# Patient Record
Sex: Male | Born: 1950 | Race: White | Hispanic: No | Marital: Married | Smoking: Former smoker
Health system: Southern US, Community
[De-identification: ages and names within clinical notes are randomized; demographics above are authoritative.]

## PROBLEM LIST (undated history)

## (undated) ENCOUNTER — Emergency Department (HOSPITAL_COMMUNITY): Admission: EM | Payer: Managed Care, Other (non HMO) | Source: Home / Self Care

## (undated) DIAGNOSIS — L97319 Non-pressure chronic ulcer of right ankle with unspecified severity: Secondary | ICD-10-CM

## (undated) DIAGNOSIS — I10 Essential (primary) hypertension: Secondary | ICD-10-CM

## (undated) DIAGNOSIS — M1990 Unspecified osteoarthritis, unspecified site: Secondary | ICD-10-CM

## (undated) DIAGNOSIS — E119 Type 2 diabetes mellitus without complications: Secondary | ICD-10-CM

## (undated) DIAGNOSIS — E7800 Pure hypercholesterolemia, unspecified: Secondary | ICD-10-CM

## (undated) DIAGNOSIS — I739 Peripheral vascular disease, unspecified: Secondary | ICD-10-CM

## (undated) DIAGNOSIS — H9190 Unspecified hearing loss, unspecified ear: Secondary | ICD-10-CM

## (undated) HISTORY — PX: PILONIDAL CYST EXCISION: SHX744

## (undated) HISTORY — DX: Non-pressure chronic ulcer of right ankle with unspecified severity: L97.319

## (undated) HISTORY — PX: COLONOSCOPY W/ POLYPECTOMY: SHX1380

## (undated) HISTORY — DX: Peripheral vascular disease, unspecified: I73.9

## (undated) HISTORY — PX: KNEE ARTHROSCOPY: SUR90

---
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
## 2018-07-18 DIAGNOSIS — T8859XA Other complications of anesthesia, initial encounter: Secondary | ICD-10-CM

## 2018-07-18 HISTORY — DX: Other complications of anesthesia, initial encounter: T88.59XA

## 2018-08-05 DIAGNOSIS — M5136 Other intervertebral disc degeneration, lumbar region: Secondary | ICD-10-CM | POA: Insufficient documentation

## 2018-08-05 DIAGNOSIS — M545 Low back pain, unspecified: Secondary | ICD-10-CM | POA: Insufficient documentation

## 2018-08-05 DIAGNOSIS — M542 Cervicalgia: Secondary | ICD-10-CM | POA: Insufficient documentation

## 2018-08-11 ENCOUNTER — Encounter (HOSPITAL_COMMUNITY): Payer: Self-pay

## 2018-08-11 ENCOUNTER — Other Ambulatory Visit: Payer: Self-pay

## 2018-08-11 NOTE — Progress Notes (Signed)
PCP - [facility]John [facility]  Cardiologist - Denies  Chest x-ray - Denies  EKG - 08/12/18-DOS  Stress Test - Denies  ECHO - Denies  Cardiac Cath - Denies  AICD- na PM- na LOOP- na  Sleep Study - Denies CPAP - None  LABS- 08/12/18: CBC, BMP  ASA- LD- 11/21  HA1C- 08/12/18 Fasting Blood Sugar -  Checks Blood Sugar __0___ times a day- Pt can not locate his meter, he sts he has not checked his bs in months.  Anesthesia- No  Pt denies having chest pain, sob, or fever during the pre-op phone call. Partial instructions given, pt sts he can not write at this time and to call back around 2:30p when his wife will be available. Pt notified of time change, as well.

## 2018-08-11 NOTE — Progress Notes (Signed)
Called pt's wife, [name] after verifying with [name] at Dr. Marshell Levanumonski's office that [facility] Marshell Levanumonski's instructions. Gave her the medications that he can have in the AM with a small sip of water. She states he does not check his blood sugar at home.

## 2018-08-12 ENCOUNTER — Inpatient Hospital Stay (HOSPITAL_COMMUNITY)
Admission: EM | Disposition: A | Discharge status: Rehabilitation Facility | Payer: Self-pay | Source: Home / Self Care | Attending: Orthopedic Surgery

## 2018-08-12 ENCOUNTER — Emergency Department (HOSPITAL_COMMUNITY): Payer: Managed Care, Other (non HMO)

## 2018-08-12 ENCOUNTER — Other Ambulatory Visit: Payer: Self-pay | Admitting: Orthopedic Surgery

## 2018-08-12 ENCOUNTER — Emergency Department (HOSPITAL_COMMUNITY): Payer: Managed Care, Other (non HMO) | Admitting: Certified Registered Nurse Anesthetist

## 2018-08-12 ENCOUNTER — Other Ambulatory Visit: Payer: Self-pay

## 2018-08-12 ENCOUNTER — Inpatient Hospital Stay (HOSPITAL_COMMUNITY)
Admission: RE | Admit: 2018-08-12 | Payer: Managed Care, Other (non HMO) | Source: Ambulatory Visit | Admitting: Orthopedic Surgery

## 2018-08-12 ENCOUNTER — Encounter (HOSPITAL_COMMUNITY): Payer: Self-pay | Admitting: Emergency Medicine

## 2018-08-12 ENCOUNTER — Inpatient Hospital Stay (HOSPITAL_COMMUNITY)
Admission: EM | Admit: 2018-08-12 | Discharge: 2018-08-19 | DRG: 472 | Disposition: A | Discharge status: Rehabilitation Facility | Payer: Managed Care, Other (non HMO) | Attending: Orthopedic Surgery | Admitting: Orthopedic Surgery

## 2018-08-12 DIAGNOSIS — Z4789 Encounter for other orthopedic aftercare: Secondary | ICD-10-CM | POA: Diagnosis present

## 2018-08-12 DIAGNOSIS — G8250 Quadriplegia, unspecified: Secondary | ICD-10-CM | POA: Diagnosis present

## 2018-08-12 DIAGNOSIS — D72829 Elevated white blood cell count, unspecified: Secondary | ICD-10-CM | POA: Diagnosis present

## 2018-08-12 DIAGNOSIS — E11649 Type 2 diabetes mellitus with hypoglycemia without coma: Secondary | ICD-10-CM | POA: Diagnosis not present

## 2018-08-12 DIAGNOSIS — E871 Hypo-osmolality and hyponatremia: Secondary | ICD-10-CM | POA: Diagnosis present

## 2018-08-12 DIAGNOSIS — G959 Disease of spinal cord, unspecified: Secondary | ICD-10-CM | POA: Diagnosis present

## 2018-08-12 DIAGNOSIS — Z79899 Other long term (current) drug therapy: Secondary | ICD-10-CM

## 2018-08-12 DIAGNOSIS — Z23 Encounter for immunization: Secondary | ICD-10-CM | POA: Diagnosis not present

## 2018-08-12 DIAGNOSIS — M25561 Pain in right knee: Secondary | ICD-10-CM | POA: Diagnosis present

## 2018-08-12 DIAGNOSIS — M4802 Spinal stenosis, cervical region: Principal | ICD-10-CM | POA: Diagnosis present

## 2018-08-12 DIAGNOSIS — K5900 Constipation, unspecified: Secondary | ICD-10-CM | POA: Diagnosis present

## 2018-08-12 DIAGNOSIS — E785 Hyperlipidemia, unspecified: Secondary | ICD-10-CM | POA: Diagnosis present

## 2018-08-12 DIAGNOSIS — Z87891 Personal history of nicotine dependence: Secondary | ICD-10-CM

## 2018-08-12 DIAGNOSIS — N289 Disorder of kidney and ureter, unspecified: Secondary | ICD-10-CM | POA: Diagnosis present

## 2018-08-12 DIAGNOSIS — R471 Dysarthria and anarthria: Secondary | ICD-10-CM | POA: Diagnosis present

## 2018-08-12 DIAGNOSIS — R296 Repeated falls: Secondary | ICD-10-CM | POA: Diagnosis present

## 2018-08-12 DIAGNOSIS — Z781 Physical restraint status: Secondary | ICD-10-CM | POA: Diagnosis not present

## 2018-08-12 DIAGNOSIS — G9589 Other specified diseases of spinal cord: Secondary | ICD-10-CM | POA: Diagnosis present

## 2018-08-12 DIAGNOSIS — H9190 Unspecified hearing loss, unspecified ear: Secondary | ICD-10-CM | POA: Diagnosis present

## 2018-08-12 DIAGNOSIS — M4712 Other spondylosis with myelopathy, cervical region: Secondary | ICD-10-CM | POA: Diagnosis present

## 2018-08-12 DIAGNOSIS — J9811 Atelectasis: Secondary | ICD-10-CM | POA: Diagnosis present

## 2018-08-12 DIAGNOSIS — Z7982 Long term (current) use of aspirin: Secondary | ICD-10-CM | POA: Diagnosis not present

## 2018-08-12 DIAGNOSIS — K567 Ileus, unspecified: Secondary | ICD-10-CM | POA: Diagnosis present

## 2018-08-12 DIAGNOSIS — Z7984 Long term (current) use of oral hypoglycemic drugs: Secondary | ICD-10-CM | POA: Diagnosis not present

## 2018-08-12 DIAGNOSIS — I10 Essential (primary) hypertension: Secondary | ICD-10-CM | POA: Diagnosis present

## 2018-08-12 DIAGNOSIS — G8918 Other acute postprocedural pain: Secondary | ICD-10-CM | POA: Diagnosis not present

## 2018-08-12 DIAGNOSIS — G542 Cervical root disorders, not elsewhere classified: Secondary | ICD-10-CM | POA: Diagnosis present

## 2018-08-12 DIAGNOSIS — E119 Type 2 diabetes mellitus without complications: Secondary | ICD-10-CM | POA: Diagnosis not present

## 2018-08-12 DIAGNOSIS — Z823 Family history of stroke: Secondary | ICD-10-CM | POA: Diagnosis not present

## 2018-08-12 DIAGNOSIS — Z9181 History of falling: Secondary | ICD-10-CM

## 2018-08-12 DIAGNOSIS — M25571 Pain in right ankle and joints of right foot: Secondary | ICD-10-CM | POA: Diagnosis present

## 2018-08-12 DIAGNOSIS — G9520 Unspecified cord compression: Secondary | ICD-10-CM

## 2018-08-12 DIAGNOSIS — K5903 Drug induced constipation: Secondary | ICD-10-CM

## 2018-08-12 DIAGNOSIS — R600 Localized edema: Secondary | ICD-10-CM | POA: Diagnosis not present

## 2018-08-12 DIAGNOSIS — Z419 Encounter for procedure for purposes other than remedying health state, unspecified: Secondary | ICD-10-CM

## 2018-08-12 DIAGNOSIS — E1165 Type 2 diabetes mellitus with hyperglycemia: Secondary | ICD-10-CM | POA: Diagnosis present

## 2018-08-12 DIAGNOSIS — Z981 Arthrodesis status: Secondary | ICD-10-CM | POA: Diagnosis not present

## 2018-08-12 HISTORY — DX: Essential (primary) hypertension: I10

## 2018-08-12 HISTORY — DX: Pure hypercholesterolemia, unspecified: E78.00

## 2018-08-12 HISTORY — PX: ANTERIOR CERVICAL DECOMP/DISCECTOMY FUSION: SHX1161

## 2018-08-12 HISTORY — DX: Unspecified osteoarthritis, unspecified site: M19.90

## 2018-08-12 HISTORY — PX: ANTERIOR CERVICAL DECOMPRESSION/DISCECTOMY FUSION 4 LEVELS: SHX5556

## 2018-08-12 HISTORY — DX: Type 2 diabetes mellitus without complications: E11.9

## 2018-08-12 LAB — CBC WITH DIFFERENTIAL/PLATELET
Abs Immature Granulocytes: 0.04 10*3/uL (ref 0.00–0.07)
Basophils Absolute: 0.1 10*3/uL (ref 0.0–0.1)
Basophils Relative: 1 %
Eosinophils Absolute: 0.1 10*3/uL (ref 0.0–0.5)
Eosinophils Relative: 1 %
HCT: 44.6 % (ref 39.0–52.0)
Hemoglobin: 14 g/dL (ref 13.0–17.0)
Immature Granulocytes: 0 %
Lymphocytes Relative: 11 %
Lymphs Abs: 1.1 10*3/uL (ref 0.7–4.0)
MCH: 28.1 pg (ref 26.0–34.0)
MCHC: 31.4 g/dL (ref 30.0–36.0)
MCV: 89.4 fL (ref 80.0–100.0)
Monocytes Absolute: 0.9 10*3/uL (ref 0.1–1.0)
Monocytes Relative: 8 %
Neutro Abs: 8.2 10*3/uL — ABNORMAL HIGH (ref 1.7–7.7)
Neutrophils Relative %: 79 %
Platelets: 208 10*3/uL (ref 150–400)
RBC: 4.99 MIL/uL (ref 4.22–5.81)
RDW: 12.2 % (ref 11.5–15.5)
WBC: 10.5 10*3/uL (ref 4.0–10.5)
nRBC: 0 % (ref 0.0–0.2)

## 2018-08-12 LAB — BASIC METABOLIC PANEL
Anion gap: 7 (ref 5–15)
BUN: 15 mg/dL (ref 8–23)
CO2: 26 mmol/L (ref 22–32)
Calcium: 9.1 mg/dL (ref 8.9–10.3)
Chloride: 107 mmol/L (ref 98–111)
Creatinine, Ser: 0.73 mg/dL (ref 0.61–1.24)
GFR calc Af Amer: >60 mL/min (ref >60)
GFR calc non Af Amer: >60 mL/min (ref >60)
Glucose, Bld: 119 mg/dL — ABNORMAL HIGH (ref 70–99)
Potassium: 3.8 mmol/L (ref 3.5–5.1)
Sodium: 140 mmol/L (ref 135–145)

## 2018-08-12 LAB — TYPE AND SCREEN
ABO/RH(D): O POS
Antibody Screen: NEGATIVE

## 2018-08-12 LAB — PROTIME-INR
INR: 1.02
Prothrombin Time: 13.3 seconds (ref 11.4–15.2)

## 2018-08-12 LAB — GLUCOSE, CAPILLARY
Glucose-Capillary: 98 mg/dL (ref 70–99)
Glucose-Capillary: 130 mg/dL — ABNORMAL HIGH (ref 70–99)

## 2018-08-12 LAB — ABO/RH: ABO/RH(D): O POS

## 2018-08-12 SURGERY — ANTERIOR CERVICAL DECOMPRESSION/DISCECTOMY FUSION 4 LEVELS
Anesthesia: General | Site: Neck

## 2018-08-12 MED ORDER — LACTATED RINGERS IV SOLN: INTRAVENOUS | Status: DC | PRN

## 2018-08-12 MED ORDER — VITAMIN D 25 MCG (1000 UNIT) PO TABS
1000.0000 [IU] | ORAL_TABLET | Freq: Every day | ORAL | Status: DC
  Administered 2018-08-13 – 2018-08-19 (×7): 1000 [IU] via ORAL
  Filled 2018-08-12 (×7): qty 1

## 2018-08-12 MED ORDER — CLONIDINE HCL 0.1 MG PO TABS
0.1000 mg | ORAL_TABLET | Freq: Two times a day (BID) | ORAL | Status: DC
  Administered 2018-08-12 – 2018-08-19 (×14): 0.1 mg via ORAL
  Filled 2018-08-12 (×14): qty 1

## 2018-08-12 MED ORDER — THROMBIN 20000 UNITS EX SOLR
CUTANEOUS | Status: DC | PRN
  Administered 2018-08-12: 17:00:00

## 2018-08-12 MED ORDER — METFORMIN HCL ER 500 MG PO TB24
1500.0000 mg | ORAL_TABLET | Freq: Every evening | ORAL | Status: DC
  Administered 2018-08-12 – 2018-08-16 (×5): 1500 mg via ORAL
  Filled 2018-08-12 (×6): qty 3

## 2018-08-12 MED ORDER — CEFAZOLIN SODIUM-DEXTROSE 2-4 GM/100ML-% IV SOLN
2.0000 g | INTRAVENOUS | Status: AC
  Administered 2018-08-12 (×2): 2 g via INTRAVENOUS

## 2018-08-12 MED ORDER — ATORVASTATIN CALCIUM 10 MG PO TABS
20.0000 mg | ORAL_TABLET | ORAL | Status: DC
  Administered 2018-08-14 – 2018-08-19 (×4): 20 mg via ORAL
  Filled 2018-08-12 (×4): qty 2

## 2018-08-12 MED ORDER — GLIMEPIRIDE 4 MG PO TABS
8.0000 mg | ORAL_TABLET | Freq: Every day | ORAL | Status: DC
  Administered 2018-08-13 – 2018-08-19 (×7): 8 mg via ORAL
  Filled 2018-08-12 (×7): qty 2

## 2018-08-12 MED ORDER — CEFAZOLIN SODIUM-DEXTROSE 2-4 GM/100ML-% IV SOLN
2.0000 g | Freq: Three times a day (TID) | INTRAVENOUS | Status: DC
  Filled 2018-08-12 (×2): qty 100

## 2018-08-12 MED ORDER — FLEET ENEMA 7-19 GM/118ML RE ENEM: 1.0000 | ENEMA | Freq: Once | RECTAL | Status: DC | PRN

## 2018-08-12 MED ORDER — ONDANSETRON HCL 4 MG/2ML IJ SOLN
INTRAMUSCULAR | Status: DC | PRN
  Administered 2018-08-12: 4 mg via INTRAVENOUS

## 2018-08-12 MED ORDER — BUPIVACAINE-EPINEPHRINE (PF) 0.25% -1:200000 IJ SOLN
INTRAMUSCULAR | Status: AC
  Filled 2018-08-12: qty 30

## 2018-08-12 MED ORDER — SODIUM CHLORIDE 0.9% FLUSH: 3.0000 mL | INTRAVENOUS | Status: DC | PRN

## 2018-08-12 MED ORDER — ALUM & MAG HYDROXIDE-SIMETH 200-200-20 MG/5ML PO SUSP
30.0000 mL | Freq: Four times a day (QID) | ORAL | Status: DC | PRN
  Administered 2018-08-15: 30 mL via ORAL
  Filled 2018-08-12: qty 30

## 2018-08-12 MED ORDER — OXYCODONE-ACETAMINOPHEN 5-325 MG PO TABS
1.0000 | ORAL_TABLET | ORAL | Status: DC | PRN
  Administered 2018-08-12 – 2018-08-19 (×10): 2 via ORAL
  Filled 2018-08-12 (×10): qty 2

## 2018-08-12 MED ORDER — AMLODIPINE BESYLATE 10 MG PO TABS
10.0000 mg | ORAL_TABLET | Freq: Every day | ORAL | Status: DC
  Administered 2018-08-13 – 2018-08-19 (×7): 10 mg via ORAL
  Filled 2018-08-12 (×7): qty 1

## 2018-08-12 MED ORDER — SODIUM CHLORIDE 0.9 % IV SOLN: 250.0000 mL | INTRAVENOUS | Status: DC

## 2018-08-12 MED ORDER — MIDAZOLAM HCL 2 MG/2ML IJ SOLN
INTRAMUSCULAR | Status: AC
  Filled 2018-08-12: qty 2

## 2018-08-12 MED ORDER — DOCUSATE SODIUM 100 MG PO CAPS
100.0000 mg | ORAL_CAPSULE | Freq: Two times a day (BID) | ORAL | Status: DC
  Administered 2018-08-12 – 2018-08-19 (×14): 100 mg via ORAL
  Filled 2018-08-12 (×14): qty 1

## 2018-08-12 MED ORDER — CEFAZOLIN SODIUM-DEXTROSE 2-4 GM/100ML-% IV SOLN
INTRAVENOUS | Status: AC
  Filled 2018-08-12: qty 100

## 2018-08-12 MED ORDER — HYDROMORPHONE HCL 1 MG/ML IJ SOLN
INTRAMUSCULAR | Status: AC
  Filled 2018-08-12: qty 1

## 2018-08-12 MED ORDER — OXYCODONE HCL 5 MG PO TABS: 5.0000 mg | ORAL_TABLET | Freq: Once | ORAL | Status: DC | PRN

## 2018-08-12 MED ORDER — PHENYLEPHRINE 40 MCG/ML (10ML) SYRINGE FOR IV PUSH (FOR BLOOD PRESSURE SUPPORT)
PREFILLED_SYRINGE | INTRAVENOUS | Status: AC
  Filled 2018-08-12: qty 10

## 2018-08-12 MED ORDER — LABETALOL HCL 5 MG/ML IV SOLN
INTRAVENOUS | Status: AC
  Filled 2018-08-12: qty 4

## 2018-08-12 MED ORDER — DIAZEPAM 5 MG PO TABS
5.0000 mg | ORAL_TABLET | Freq: Four times a day (QID) | ORAL | Status: DC | PRN
  Administered 2018-08-13 – 2018-08-15 (×3): 5 mg via ORAL
  Filled 2018-08-12 (×4): qty 1

## 2018-08-12 MED ORDER — VECURONIUM BROMIDE 10 MG IV SOLR
INTRAVENOUS | Status: AC
  Filled 2018-08-12: qty 10

## 2018-08-12 MED ORDER — HYDROMORPHONE HCL 1 MG/ML IJ SOLN
0.2500 mg | INTRAMUSCULAR | Status: DC | PRN
  Administered 2018-08-12 (×2): 0.5 mg via INTRAVENOUS

## 2018-08-12 MED ORDER — POVIDONE-IODINE 7.5 % EX SOLN
Freq: Once | CUTANEOUS | Status: DC
  Filled 2018-08-12: qty 118

## 2018-08-12 MED ORDER — ACETAMINOPHEN 325 MG PO TABS
650.0000 mg | ORAL_TABLET | ORAL | Status: DC | PRN
  Administered 2018-08-14 – 2018-08-17 (×2): 650 mg via ORAL
  Filled 2018-08-12 (×2): qty 2

## 2018-08-12 MED ORDER — ONDANSETRON HCL 4 MG PO TABS: 4.0000 mg | ORAL_TABLET | Freq: Four times a day (QID) | ORAL | Status: DC | PRN

## 2018-08-12 MED ORDER — PANTOPRAZOLE SODIUM 40 MG IV SOLR
40.0000 mg | Freq: Every day | INTRAVENOUS | Status: DC
  Administered 2018-08-12: 40 mg via INTRAVENOUS
  Filled 2018-08-12: qty 40

## 2018-08-12 MED ORDER — BISACODYL 5 MG PO TBEC
5.0000 mg | DELAYED_RELEASE_TABLET | Freq: Every day | ORAL | Status: DC | PRN
  Administered 2018-08-19: 5 mg via ORAL
  Filled 2018-08-12: qty 1

## 2018-08-12 MED ORDER — SUGAMMADEX SODIUM 500 MG/5ML IV SOLN
INTRAVENOUS | Status: AC
  Filled 2018-08-12: qty 5

## 2018-08-12 MED ORDER — FENTANYL CITRATE (PF) 250 MCG/5ML IJ SOLN
INTRAMUSCULAR | Status: AC
  Filled 2018-08-12: qty 5

## 2018-08-12 MED ORDER — 0.9 % SODIUM CHLORIDE (POUR BTL) OPTIME
TOPICAL | Status: DC | PRN
  Administered 2018-08-12: 500 mL
  Administered 2018-08-12: 1000 mL

## 2018-08-12 MED ORDER — SENNOSIDES-DOCUSATE SODIUM 8.6-50 MG PO TABS: 1.0000 | ORAL_TABLET | Freq: Every evening | ORAL | Status: DC | PRN

## 2018-08-12 MED ORDER — OXYCODONE HCL 5 MG/5ML PO SOLN: 5.0000 mg | Freq: Once | ORAL | Status: DC | PRN

## 2018-08-12 MED ORDER — PHENYLEPHRINE HCL 10 MG/ML IJ SOLN
INTRAMUSCULAR | Status: DC | PRN
  Administered 2018-08-12: 80 ug via INTRAVENOUS

## 2018-08-12 MED ORDER — SODIUM CHLORIDE 0.9% FLUSH
3.0000 mL | Freq: Two times a day (BID) | INTRAVENOUS | Status: DC
  Administered 2018-08-13 – 2018-08-17 (×9): 3 mL via INTRAVENOUS

## 2018-08-12 MED ORDER — ROCURONIUM BROMIDE 10 MG/ML (PF) SYRINGE
PREFILLED_SYRINGE | INTRAVENOUS | Status: DC | PRN
  Administered 2018-08-12 (×2): 50 mg via INTRAVENOUS

## 2018-08-12 MED ORDER — PROPOFOL 10 MG/ML IV BOLUS
INTRAVENOUS | Status: AC
  Filled 2018-08-12: qty 20

## 2018-08-12 MED ORDER — FENTANYL CITRATE (PF) 250 MCG/5ML IJ SOLN
INTRAMUSCULAR | Status: DC | PRN
  Administered 2018-08-12: 150 ug via INTRAVENOUS
  Administered 2018-08-12: 100 ug via INTRAVENOUS

## 2018-08-12 MED ORDER — LISINOPRIL 40 MG PO TABS
40.0000 mg | ORAL_TABLET | Freq: Every day | ORAL | Status: DC
  Administered 2018-08-13 – 2018-08-19 (×7): 40 mg via ORAL
  Filled 2018-08-12 (×7): qty 1

## 2018-08-12 MED ORDER — LIDOCAINE 2% (20 MG/ML) 5 ML SYRINGE
INTRAMUSCULAR | Status: DC | PRN
  Administered 2018-08-12: 100 mg via INTRAVENOUS

## 2018-08-12 MED ORDER — LABETALOL HCL 5 MG/ML IV SOLN
10.0000 mg | INTRAVENOUS | Status: DC | PRN
  Administered 2018-08-12: 10 mg via INTRAVENOUS

## 2018-08-12 MED ORDER — MIDAZOLAM HCL 2 MG/2ML IJ SOLN
INTRAMUSCULAR | Status: DC | PRN
  Administered 2018-08-12 (×2): 1 mg via INTRAVENOUS

## 2018-08-12 MED ORDER — SODIUM CHLORIDE 0.9 % IV SOLN
Freq: Once | INTRAVENOUS | Status: AC
  Administered 2018-08-12: 08:00:00 via INTRAVENOUS

## 2018-08-12 MED ORDER — ACETAMINOPHEN 500 MG PO TABS: 1000.0000 mg | ORAL_TABLET | Freq: Every day | ORAL | Status: DC | PRN

## 2018-08-12 MED ORDER — LACTATED RINGERS IV SOLN
INTRAVENOUS | Status: DC
  Administered 2018-08-12 (×2): via INTRAVENOUS

## 2018-08-12 MED ORDER — THROMBIN 5000 UNITS EX SOLR
CUTANEOUS | Status: DC | PRN
  Administered 2018-08-12: 5000 [IU] via TOPICAL

## 2018-08-12 MED ORDER — CEFAZOLIN SODIUM-DEXTROSE 2-4 GM/100ML-% IV SOLN
2.0000 g | Freq: Three times a day (TID) | INTRAVENOUS | Status: AC
  Administered 2018-08-13 (×2): 2 g via INTRAVENOUS
  Filled 2018-08-12 (×2): qty 100

## 2018-08-12 MED ORDER — VECURONIUM BROMIDE 10 MG IV SOLR
INTRAVENOUS | Status: DC | PRN
  Administered 2018-08-12: 2 mg via INTRAVENOUS

## 2018-08-12 MED ORDER — PHENOL 1.4 % MT LIQD
1.0000 | OROMUCOSAL | Status: DC | PRN
  Administered 2018-08-13: 1 via OROMUCOSAL
  Filled 2018-08-12: qty 177

## 2018-08-12 MED ORDER — SODIUM CHLORIDE 0.9 % IV SOLN
INTRAVENOUS | Status: DC | PRN
  Administered 2018-08-12: 30 ug/min via INTRAVENOUS

## 2018-08-12 MED ORDER — ROCURONIUM BROMIDE 50 MG/5ML IV SOSY
PREFILLED_SYRINGE | INTRAVENOUS | Status: AC
  Filled 2018-08-12: qty 5

## 2018-08-12 MED ORDER — DEXAMETHASONE SODIUM PHOSPHATE 10 MG/ML IJ SOLN
INTRAMUSCULAR | Status: DC | PRN
  Administered 2018-08-12: 10 mg via INTRAVENOUS

## 2018-08-12 MED ORDER — BUPIVACAINE-EPINEPHRINE 0.25% -1:200000 IJ SOLN
INTRAMUSCULAR | Status: DC | PRN
  Administered 2018-08-12: 9 mL

## 2018-08-12 MED ORDER — ACETAMINOPHEN 650 MG RE SUPP: 650.0000 mg | RECTAL | Status: DC | PRN

## 2018-08-12 MED ORDER — CEFAZOLIN SODIUM 1 G IJ SOLR
INTRAMUSCULAR | Status: AC
  Filled 2018-08-12: qty 20

## 2018-08-12 MED ORDER — ONDANSETRON HCL 4 MG/2ML IJ SOLN: 4.0000 mg | Freq: Four times a day (QID) | INTRAMUSCULAR | Status: DC | PRN

## 2018-08-12 MED ORDER — PROMETHAZINE HCL 25 MG/ML IJ SOLN: 6.2500 mg | INTRAMUSCULAR | Status: DC | PRN

## 2018-08-12 MED ORDER — THROMBIN 5000 UNITS EX SOLR
CUTANEOUS | Status: AC
  Filled 2018-08-12: qty 5000

## 2018-08-12 MED ORDER — PIOGLITAZONE HCL 15 MG PO TABS
15.0000 mg | ORAL_TABLET | Freq: Every day | ORAL | Status: DC
  Administered 2018-08-13 – 2018-08-19 (×7): 15 mg via ORAL
  Filled 2018-08-12 (×7): qty 1

## 2018-08-12 MED ORDER — THROMBIN (RECOMBINANT) 20000 UNITS EX SOLR
CUTANEOUS | Status: AC
  Filled 2018-08-12: qty 20000

## 2018-08-12 MED ORDER — ONDANSETRON HCL 4 MG/2ML IJ SOLN
INTRAMUSCULAR | Status: AC
  Filled 2018-08-12: qty 2

## 2018-08-12 MED ORDER — PROPOFOL 10 MG/ML IV BOLUS
INTRAVENOUS | Status: DC | PRN
  Administered 2018-08-12: 200 mg via INTRAVENOUS

## 2018-08-12 MED ORDER — DEXAMETHASONE SODIUM PHOSPHATE 4 MG/ML IJ SOLN
8.0000 mg | Freq: Once | INTRAMUSCULAR | Status: AC
  Administered 2018-08-13: 8 mg via INTRAVENOUS
  Filled 2018-08-12: qty 2

## 2018-08-12 MED ORDER — PHENYLEPHRINE 40 MCG/ML (10ML) SYRINGE FOR IV PUSH (FOR BLOOD PRESSURE SUPPORT)
PREFILLED_SYRINGE | INTRAVENOUS | Status: DC | PRN
  Administered 2018-08-12 (×3): 80 ug via INTRAVENOUS

## 2018-08-12 MED ORDER — MENTHOL 3 MG MT LOZG: 1.0000 | LOZENGE | OROMUCOSAL | Status: DC | PRN

## 2018-08-12 SURGICAL SUPPLY — 75 items
APL SKNCLS STERI-STRIP NONHPOA (GAUZE/BANDAGES/DRESSINGS) ×1
BENZOIN TINCTURE PRP APPL 2/3 (GAUZE/BANDAGES/DRESSINGS) ×2 IMPLANT
BIT DRILL NEURO 2X3.1 SFT TUCH (MISCELLANEOUS) ×1 IMPLANT
BIT DRILL SRG 14X2.2XFLT CHK (BIT) IMPLANT
BIT DRL SRG 14X2.2XFLT CHK (BIT) ×1
BLADE CLIPPER SURG (BLADE) ×2 IMPLANT
BLADE SURG 15 STRL LF DISP TIS (BLADE) ×1 IMPLANT
BLADE SURG 15 STRL SS (BLADE) ×2
BONE VIVIGEN FORMABLE 5.4CC (Bone Implant) ×2 IMPLANT
BUR MATCHSTICK NEURO 3.0 LAGG (BURR) IMPLANT
CABLE BIPOLOR RESECTION CORD (MISCELLANEOUS) ×2 IMPLANT
CANISTER SUCTION WELLS/JOHNSON (MISCELLANEOUS) ×2 IMPLANT
CARTRIDGE OIL MAESTRO DRILL (MISCELLANEOUS) ×1 IMPLANT
COVER SURGICAL LIGHT HANDLE (MISCELLANEOUS) ×2 IMPLANT
COVER WAND RF STERILE (DRAPES) ×2 IMPLANT
CRADLE DONUT ADULT HEAD (MISCELLANEOUS) ×2 IMPLANT
DECANTER SPIKE VIAL GLASS SM (MISCELLANEOUS) ×2 IMPLANT
DIFFUSER DRILL AIR PNEUMATIC (MISCELLANEOUS) ×2 IMPLANT
DRAIN JACKSON RD 7FR 3/32 (WOUND CARE) IMPLANT
DRAPE C-ARM 42X72 X-RAY (DRAPES) ×2 IMPLANT
DRAPE POUCH INSTRU U-SHP 10X18 (DRAPES) ×2 IMPLANT
DRAPE SURG 17X23 STRL (DRAPES) ×7 IMPLANT
DRILL BIT SKYLINE 14MM (BIT) ×2
DRILL NEURO 2X3.1 SOFT TOUCH (MISCELLANEOUS) ×2
DURAPREP 26ML APPLICATOR (WOUND CARE) ×2 IMPLANT
ELECT COATED BLADE 2.86 ST (ELECTRODE) ×2 IMPLANT
ELECT REM PT RETURN 9FT ADLT (ELECTROSURGICAL) ×2
ELECTRODE REM PT RTRN 9FT ADLT (ELECTROSURGICAL) ×1 IMPLANT
EVACUATOR SILICONE 100CC (DRAIN) IMPLANT
GAUZE 4X4 16PLY RFD (DISPOSABLE) ×2 IMPLANT
GAUZE SPONGE 4X4 12PLY STRL (GAUZE/BANDAGES/DRESSINGS) ×2 IMPLANT
GLOVE BIO SURGEON STRL SZ7 (GLOVE) ×6 IMPLANT
GLOVE BIO SURGEON STRL SZ8 (GLOVE) ×4 IMPLANT
GLOVE BIOGEL PI IND STRL 7.0 (GLOVE) ×3 IMPLANT
GLOVE BIOGEL PI IND STRL 8 (GLOVE) ×1 IMPLANT
GLOVE BIOGEL PI INDICATOR 7.0 (GLOVE) ×3
GLOVE BIOGEL PI INDICATOR 8 (GLOVE) ×2
GOWN STRL REUS W/ TWL LRG LVL3 (GOWN DISPOSABLE) ×3 IMPLANT
GOWN STRL REUS W/ TWL XL LVL3 (GOWN DISPOSABLE) ×2 IMPLANT
GOWN STRL REUS W/TWL LRG LVL3 (GOWN DISPOSABLE) ×6
GOWN STRL REUS W/TWL XL LVL3 (GOWN DISPOSABLE) ×4
INTERLOCK LRDTC CRVCL VBR 6MM (Bone Implant) ×2 IMPLANT
INTERLOCK LRDTC CRVCL VBR 8MM (Peek) ×2 IMPLANT
IV CATH 14GX2 1/4 (CATHETERS) ×2 IMPLANT
KIT BASIN OR (CUSTOM PROCEDURE TRAY) ×2 IMPLANT
KIT TURNOVER KIT B (KITS) ×2 IMPLANT
LORDOTIC CERVICAL VBR 6MM SM (Bone Implant) ×4 IMPLANT
LORDOTIC CERVICAL VBR 8MM SM (Peek) ×4 IMPLANT
MANIFOLD NEPTUNE II (INSTRUMENTS) ×2 IMPLANT
NEEDLE PRECISIONGLIDE 27X1.5 (NEEDLE) ×2 IMPLANT
NEEDLE SPNL 20GX3.5 QUINCKE YW (NEEDLE) ×2 IMPLANT
NS IRRIG 1000ML POUR BTL (IV SOLUTION) ×2 IMPLANT
OIL CARTRIDGE MAESTRO DRILL (MISCELLANEOUS) ×2
PACK ORTHO CERVICAL (CUSTOM PROCEDURE TRAY) ×2 IMPLANT
PAD ARMBOARD 7.5X6 YLW CONV (MISCELLANEOUS) ×4 IMPLANT
PATTIES SURGICAL .5 X.5 (GAUZE/BANDAGES/DRESSINGS) IMPLANT
PATTIES SURGICAL .5 X1 (DISPOSABLE) ×1 IMPLANT
PIN DISTRACTION 14 (PIN) ×4 IMPLANT
PLATE SKYLINE 4LVL 76MM (Plate) ×2 IMPLANT
SCREW SKYLINE VAR OS 14MM (Screw) ×10 IMPLANT
SPONGE INTESTINAL PEANUT (DISPOSABLE) ×4 IMPLANT
SPONGE SURGIFOAM ABS GEL 100 (HEMOSTASIS) ×2 IMPLANT
STRIP CLOSURE SKIN 1/2X4 (GAUZE/BANDAGES/DRESSINGS) ×2 IMPLANT
SURGIFLO W/THROMBIN 8M KIT (HEMOSTASIS) IMPLANT
SUT MNCRL AB 4-0 PS2 18 (SUTURE) ×2 IMPLANT
SUT VIC AB 2-0 CT2 18 VCP726D (SUTURE) ×3 IMPLANT
SYR BULB IRRIGATION 50ML (SYRINGE) ×2 IMPLANT
SYR CONTROL 10ML LL (SYRINGE) ×4 IMPLANT
TAPE CLOTH 4X10 WHT NS (GAUZE/BANDAGES/DRESSINGS) ×2 IMPLANT
TAPE UMBILICAL COTTON 1/8X30 (MISCELLANEOUS) ×2 IMPLANT
TOWEL OR 17X24 6PK STRL BLUE (TOWEL DISPOSABLE) ×2 IMPLANT
TOWEL OR 17X26 10 PK STRL BLUE (TOWEL DISPOSABLE) ×2 IMPLANT
TRAY FOLEY MTR SLVR 16FR STAT (SET/KITS/TRAYS/PACK) ×2 IMPLANT
WATER STERILE IRR 1000ML POUR (IV SOLUTION) ×2 IMPLANT
YANKAUER SUCT BULB TIP NO VENT (SUCTIONS) ×2 IMPLANT

## 2018-08-12 NOTE — ED Notes (Signed)
Pt is to be taken to short stay for pre-op after 1345. Pt and family aware.

## 2018-08-12 NOTE — Op Note (Signed)
NAME:  Brandon Collins                MEDICAL RECORD NO:  [id number]  PHYSICIAN:  Estill BambergMark Clayton Bosserman, MD      DATE OF BIRTH:  28-Oct-1950  DATE OF PROCEDURE:  08/12/2018 Second medicine                              OPERATIVE REPORT   PREOPERATIVE DIAGNOSES: 1. Rapidly progressive cervical myelopathy 2. Spinal stenosis spanning C3-C7, including severe spinal cord compression and myelomalacia  POSTOPERATIVE DIAGNOSES: 1. Rapidly progressive cervical myelopathy 2. Spinal stenosis spanning C3-C7, including severe spinal cord compression and myelomalacia  PROCEDURE: 1. Anterior cervical decompression and fusion C3/4, C4/5, C5/6, C6/7. 2. Placement of anterior instrumentation, C3-C7. 3. Insertion of interbody device x 4 (Titan intervertebral spacers). 4. Intraoperative use of fluoroscopy. 5. Use of morselized allograft - ViviGen.  SURGEON:  Estill BambergMark Kanen Mottola, MD  ASSISTANT:  Jason CoopKayla McKenzie, [name].  ANESTHESIA:  General endotracheal anesthesia.  COMPLICATIONS:  None.  DISPOSITION:  Stable.  ESTIMATED BLOOD LOSS:  Minimal.  INDICATIONS FOR SURGERY:  Briefly, Brandon Collins is a pleasant 67 year old male, who did present to me with rapidly progressive cervical myelopathy. Specifically, 3 weeks ago, the patient was walking independently, and over the course of the last 2 to 3 weeks, the patient has rapidly lost strength in his bilateral arms and legs.  He has been having to walk with a rolling walker.  His MRI did reveal very prominent findings, as outlined above.  We did discuss proceeding with the surgery noted above, on an outpatient basis.  However, the patient's symptoms became so severe to where he was unable to safely care for himself at home, and he did present to the emergency department earlier today.  We did again discuss the details of the surgery outlined above, and the patient did wish to proceed.  Please refer to my office notes for additional details regarding discussion with  the patient, in addition to the expected outcomes of surgery, risks of surgery, and recovery period.    OPERATIVE DETAILS:  On 08/12/2018, the patient was brought to surgery and general endotracheal anesthesia was administered.  The patient was placed supine on the hospital bed. The neck was gently extended.  All bony prominences were meticulously padded.  The neck was prepped and draped in the usual sterile fashion.  At this point, I did make a left-sided oblique incision.  The platysma was incised.  A Smith-Robinson approach was used and the anterior spine was identified. A self-retaining retractor was placed.  I then subperiosteally exposed the vertebral bodies from C3-C7.  Caspar pins were then placed into the C3 and C4 vertebral bodies, as this was the worst level, and distraction was gently applied.   A thorough and complete C3-4 intervertebral diskectomy was performed.  The posterior longitudinal ligament was identified and entered using a nerve hook.  I then used #1 followed by #2 Kerrison to perform a thorough and complete intervertebral diskectomy.  The spinal canal was thoroughly decompressed, as was the right and left neuroforamen.  The endplates were then prepared and the appropriate-sized intervertebral spacer was then packed with ViviGen and tamped into position in the usual fashion.  The upper Caspar pin was then removed and placed into the C5 vertebral body and once again, distraction was applied across the C4-5 intervertebral space.  I then again performed a thorough and complete diskectomy, thoroughly  decompressing the spinal canal and bilateral neuroforamena.  After preparing the endplates, the appropriate-sized intervertebral spacer was packed with ViviGen and tamped into position.  The upper Caspar pin was then removed and placed into the C6 vertebral body and once again, distraction was applied across the C5-6 intervertebral space.  I then again performed a  thorough and complete diskectomy, thoroughly decompressing the spinal canal and bilateral neuroforamena.  After preparing the endplates, the appropriate-sized intervertebral spacer was packed with ViviGen and tamped into position. The upper Caspar pin was then removed and placed into the C7 vertebral body and once again, distraction was applied across the C6-7 intervertebral space.  I then again performed a thorough and complete diskectomy, thoroughly decompressing the spinal canal and bilateral neuroforamena.  After preparing the endplates, the appropriate-sized intervertebral spacer was packed with ViviGen and tamped into position. The Caspar pins then were removed and bone wax was placed in their place.  The appropriate-sized anterior cervical plate was placed over the anterior spine.  14 mm variable angle screws were placed, 2 in each vertebral body from C3-C7 for a total of 10 vertebral body screws.  The screws were then locked to the plate using the Cam locking mechanism.  I was very pleased with the final fluoroscopic images.  The wound was then irrigated.  The wound was then explored for any undue bleeding and there was no bleeding noted. The wound was then closed in layers using 2-0 Vicryl, followed by 4-0 Monocryl.  Benzoin and Steri-Strips were applied, followed by sterile dressing.  All instrument counts were correct at the termination of the procedure.  Of note, Jason Coop, [name], was my assistant throughout surgery, and did aid in retraction, suctioning, and closure from start to finish.     Estill Bamberg, MD

## 2018-08-12 NOTE — Transfer of Care (Signed)
Immediate Anesthesia Transfer of Care Note  Patient: Brandon Collins  Procedure(s) Performed: ANTERIOR CERVICAL DECOMPRESSION FUSION CERVICAL 3-4, CERVICAL 4-5, CERVICAL 5-6, CERVICAL 6-7 WITH INSTRUMENTATION AND ALLOGRAFT (N/A Neck)  Patient Location: PACU  Anesthesia Type:General  Level of Consciousness: awake  Airway & Oxygen Therapy: Patient Spontanous Breathing  Post-op Assessment: Report given to RN and Post -op Vital signs reviewed and stable  Post vital signs: Reviewed and stable  Last Vitals:  Vitals Value Taken Time  BP    Temp    Pulse 106 08/12/2018  9:07 PM  Resp 30 08/12/2018  9:07 PM  SpO2 98 % 08/12/2018  9:07 PM  Vitals shown include unvalidated device data.  Last Pain:  Vitals:   08/12/18 0703  TempSrc:   PainSc: 7          Complications: No apparent anesthesia complications

## 2018-08-12 NOTE — Progress Notes (Signed)
Geralynn OchsCarl S Schutter is a 67 y.o. male patient admitted from PACU to 6N09 awake, alert - oriented  X 4 - no acute distress noted.  VSS - Blood pressure (!) 174/77, pulse 93, temperature 98.8 F (37.1 C), temperature source Oral, resp. rate (!) 21, height 6' (1.829 m), weight 102.1 kg, SpO2 97 %.    IV in place, occlusive dsg intact without redness.    Will cont to eval and treat per MD orders.  Rolland PorterJosephine M Alric Geise, RN 08/12/2018 10:57 PM

## 2018-08-12 NOTE — Anesthesia Preprocedure Evaluation (Addendum)
Anesthesia Evaluation  Patient identified by MRN, date of birth, ID band Patient awake    Reviewed: Allergy & Precautions, NPO status , Patient's Chart, lab work & pertinent test results  Airway Mallampati: III  TM Distance: >3 FB Neck ROM: Limited    Dental no notable dental hx.    Pulmonary former smoker,    Pulmonary exam normal breath sounds clear to auscultation       Cardiovascular hypertension, Pt. on medications Normal cardiovascular exam Rhythm:Regular Rate:Normal  ECG: SR, rate 61   Neuro/Psych negative neurological ROS  negative psych ROS   GI/Hepatic negative GI ROS, Neg liver ROS,   Endo/Other  diabetes, Oral Hypoglycemic Agents  Renal/GU negative Renal ROS     Musculoskeletal negative musculoskeletal ROS (+)   Abdominal (+) + obese,   Peds  Hematology negative hematology ROS (+) HLD   Anesthesia Other Findings PROMINATE AND PROGRESSIVE CERVICAL MYELOPATHY, WITH AN MRI NOTABLE FOR MYELOMALACIA AND SPINAL CORD COMPRESSION  Reproductive/Obstetrics                            Anesthesia Physical Anesthesia Plan  ASA: III and emergent  Anesthesia Plan: General   Post-op Pain Management:    Induction: Intravenous  PONV Risk Score and Plan: 3 and Midazolam, Dexamethasone, Ondansetron and Treatment may vary due to age or medical condition  Airway Management Planned: Oral ETT and Video Laryngoscope Planned  Additional Equipment:   Intra-op Plan:   Post-operative Plan: Extubation in OR  Informed Consent: I have reviewed the patients History and Physical, chart, labs and discussed the procedure including the risks, benefits and alternatives for the proposed anesthesia with the patient or authorized representative who has indicated his/her understanding and acceptance.   Dental advisory given  Plan Discussed with: CRNA  Anesthesia Plan Comments:         Anesthesia  Quick Evaluation

## 2018-08-12 NOTE — ED Triage Notes (Signed)
Pt arrived POV under the impression he was going to the OR for cervical procedure. Pt is now to be seen in the ED.  Pt states he has fallen again at home, pt c/o pain and swelling to R knee.

## 2018-08-12 NOTE — Anesthesia Procedure Notes (Signed)
Procedure Name: Intubation Date/Time: 08/12/2018 4:38 PM Performed by: Burt Ekurner, Finnis Colee Ashley, CRNA Pre-anesthesia Checklist: Patient identified, Emergency Drugs available, Suction available and Patient being monitored Patient Re-evaluated:Patient Re-evaluated prior to induction Oxygen Delivery Method: Circle system utilized Preoxygenation: Pre-oxygenation with 100% oxygen Induction Type: IV induction Ventilation: Mask ventilation with difficulty, Oral airway inserted - appropriate to patient size and Two handed mask ventilation required Laryngoscope Size: Glidescope and 4 Grade View: Grade I Tube type: Oral Tube size: 7.5 mm Number of attempts: 1 Airway Equipment and Method: Rigid stylet Placement Confirmation: ETT inserted through vocal cords under direct vision,  positive ETCO2 and breath sounds checked- equal and bilateral Secured at: 21 cm Tube secured with: Tape Dental Injury: Teeth and Oropharynx as per pre-operative assessment  Comments: Head and neck maintained neutral position during intubation

## 2018-08-12 NOTE — Consult Note (Signed)
Reason for Consult: Balance deterioration, upper and lower extremity weakness Referring Physician: Dr. Rayburn Go MONTREY BUIST is an 67 y.o. male.   HPI: Patient did present to the emergency department today with progressive deterioration in balance, and progressive upper and lower extremity weakness.I did evaluate the patient in the office 4 days ago with similar symptoms.  He does feel that his symptoms have been increasingly progressive.He does report that his balance has been compromised over the course of the last 6 months, but particular over the last couple weeks, his symptoms have very rapidly deteriorated.  He does have pain in his neck as well.  When I last saw him in the office, an MRI was reviewed, notable for prominent spinal cord compression, spanning C3 down to C7.  We did discuss proceeding with surgery, given his prominent deterioration.  He does continue to use a rolling walker, as he does not feel steady on his feet.  He feel that he has got to a point where it is difficult for him to perform routine activities of daily living at home.  Past Medical History:  Diagnosis Date  . Arthritis   . Diabetes mellitus without complication (HCC)    Type II  . Hypertension     Past Surgical History:  Procedure Laterality Date  . KNEE ARTHROSCOPY     Right  . PILONIDAL CYST EXCISION      No family history on file.  Social History:  reports that he has quit smoking. His smoking use included cigarettes. He smoked 1.00 pack per day. He has never used smokeless tobacco. He reports that he drinks alcohol. He reports that he does not use drugs.  Allergies: No Known Allergies  Medications: I have reviewed the patient's current medications.  Results for orders placed or performed during the hospital encounter of 08/12/18 (from the past 48 hour(s))  Type and screen [facility]     Status: None   Collection Time: 08/12/18  7:45 AM  Result Value Ref Range   ABO/RH(D) O POS     Antibody Screen NEG    Sample Expiration      08/15/2018 Performed at [facility], [address], [place], [place] [address]   Basic metabolic panel     Status: Abnormal   Collection Time: 08/12/18  7:47 AM  Result Value Ref Range   Sodium 140 135 - 145 mmol/L   Potassium 3.8 3.5 - 5.1 mmol/L   Chloride 107 98 - 111 mmol/L   CO2 26 22 - 32 mmol/L   Glucose, Bld 119 (H) 70 - 99 mg/dL   BUN 15 8 - 23 mg/dL   Creatinine, Ser 8.29 0.61 - 1.24 mg/dL   Calcium 9.1 8.9 - 56.2 mg/dL   GFR calc non Af Amer >60 >60 mL/min   GFR calc Af Amer >60 >60 mL/min   Anion gap 7 5 - 15    Comment: Performed at [facility], [address], [place], [place] [address]  CBC with Differential     Status: Abnormal   Collection Time: 08/12/18  7:47 AM  Result Value Ref Range   WBC 10.5 4.0 - 10.5 K/uL   RBC 4.99 4.22 - 5.81 MIL/uL   Hemoglobin 14.0 13.0 - 17.0 g/dL   HCT 57.8 46.9 - 62.9 %   MCV 89.4 80.0 - 100.0 fL   MCH 28.1 26.0 - 34.0 pg   MCHC 31.4 30.0 - 36.0 g/dL  RDW 12.2 11.5 - 15.5 %   Platelets 208 150 - 400 K/uL   nRBC 0.0 0.0 - 0.2 %   Neutrophils Relative % 79 %   Neutro Abs 8.2 (H) 1.7 - 7.7 K/uL   Lymphocytes Relative 11 %   Lymphs Abs 1.1 0.7 - 4.0 K/uL   Monocytes Relative 8 %   Monocytes Absolute 0.9 0.1 - 1.0 K/uL   Eosinophils Relative 1 %   Eosinophils Absolute 0.1 0.0 - 0.5 K/uL   Basophils Relative 1 %   Basophils Absolute 0.1 0.0 - 0.1 K/uL   Immature Granulocytes 0 %   Abs Immature Granulocytes 0.04 0.00 - 0.07 K/uL    Comment: Performed at [facility], [address] [place], [place] [address]  Protime-INR     Status: None   Collection Time: 08/12/18  7:47 AM  Result Value Ref Range   Prothrombin Time 13.3 11.4 - 15.2 seconds   INR 1.02     Comment: Performed at [facility], [address] [place], [place] [address]    No results found.  Review of Systems  Constitutional: Negative.   HENT: Negative.   Eyes: Negative.    Genitourinary: Negative.   Musculoskeletal: Positive for falls and neck pain.  Skin: Negative.   Neurological: Positive for weakness.   Blood pressure (!) 148/63, pulse 80, temperature 98 F (36.7 C), temperature source Oral, resp. rate 18, height 6' (1.829 m), weight 102.1 kg, SpO2 100 %. Physical Exam  Constitutional: He is oriented to person, place, and time. He appears well-developed and well-nourished.  HENT:  Head: Normocephalic.  Eyes: Pupils are equal, round, and reactive to light.  Neck: Normal range of motion. Neck supple.  Respiratory: Effort normal.  GI: Soft.  Neurological: He is alert and oriented to person, place, and time. Coordination abnormal.  Patient does have a prominently positive Hoffman sign bilaterally  Skin: Skin is warm and dry.  Psychiatric: He has a normal mood and affect. His behavior is normal. Thought content normal.   The patient's MRI was again reviewed, again notable for severe spinal cord compression, with prominent myelomalacia also identified.  His pathology does span C3-C7.  I do feel that it is very clear that the pathology noted on his MRI certainly does explain his prominent upper and lower show me weakness, and balance deterioration.  Assessment/Plan: I did Again discuss the patient's cervical MRI with him and his wife. Specifically, he does have prominent myelomalacia and spinal cord compression.  His function is extremely compromised, even as compared to 1 week ago.  I do feel that urgent treatment of his pathology is very appropriate, given his rapid deterioration.  Given the findings on his MRI, and his very rapid deterioration in function, we did discuss the details involving and anterior cervical decompression and fusion involving C3-C4, C4-C5, C5-C6, as well as C6-C7. We did again extensively discuss the risks, benefits, and recovery period associated with surgery. The patient understands that the risks include infection, nerve injury (which  may result in permanent numbness, weakness or pain), spinal cord injury, lack of resolution of neck pain, adjacent segment degeneration, nonunion, injury to the surrounding soft tissues, bleeding, cerebral spinal fluid leak, and possible lack of resolution of arm pain. The patient does understand that as this is a 4 level procedure, dysphasia and compromise swallowing is likely, but is also likely to improve over time. The patient does understand that the goal of surgery is to prevent  additional deterioration in his current cervical myelopathy. He does understand that any improvement is less predictable. Additional details of our discussion, regarding the risks of surgery and recovery period, can be found in my outpatient office notes.  He is currently NPO, and he will remain NPO. We will go ahead and get him set up for surgery as soon as we can find available space.  It is my current understanding that we will be able to proceed with surgery this afternoon, at approximately 2 or 3pm.   Jackelyn HoehnMark L Kinzlie Harney 08/12/2018, 9:03 AM

## 2018-08-12 NOTE — ED Provider Notes (Signed)
[facility]   CSN: [id number] Arrival date & time: 08/12/18  [id number]     History   Chief Complaint Chief Complaint  Patient presents with  . Neck Pain  . Knee Pain    HPI Brandon OchsCarl S Collins is a 67 y.o. male.  67 year old male with history of hypertension diabetes and chronic cervical pain with some upper extremity weakness who was anticipated to go to the operating room today under Dr. Yevette Edwardsumonski service.  He has had chronic neck pain for over 2 years but it has been worse over the last few months.  He rates the pain a 7 out of 10.  Its associated with some weakness in his hands bilaterally.  He has had a few falls recently and Thursday fell and twisted his right knee.  That is also bothering him.  Since he gets very difficult for him to get in and out of the car and it was difficult to get to the hospital today.  He denies any fevers chills nausea vomiting diarrhea bowel or bladder incontinence.  The history is provided by the patient.  Neck Pain   This is a chronic problem. Episode onset: 2 years. The problem occurs constantly. The problem has not changed since onset.The pain is associated with nothing. There has been no fever. The pain is present in the generalized neck. The pain is at a severity of 7/10. The symptoms are aggravated by position. Associated symptoms include weakness. Pertinent negatives include no chest pain, no bowel incontinence and no bladder incontinence.  Knee Pain   This is a new problem. The current episode started more than 2 days ago. The problem occurs constantly. The problem has been gradually improving. The pain is present in the right knee. The quality of the pain is described as aching. The pain is at a severity of 4/10. The symptoms are aggravated by activity. He has tried rest for the symptoms. There has been a history of trauma.    Past Medical History:  Diagnosis Date  . Arthritis   . Diabetes mellitus  without complication (HCC)    Type II  . Hypertension     There are no active problems to display for this patient.   Past Surgical History:  Procedure Laterality Date  . KNEE ARTHROSCOPY     Right  . PILONIDAL CYST EXCISION          Home Medications    Prior to Admission medications   Medication Sig Start Date End Date Taking? Authorizing Provider  acetaminophen (TYLENOL) 500 MG tablet Take 1,000 mg by mouth daily as needed for moderate pain or headache.    [provider]  amLODipine (NORVASC) 10 MG tablet Take 10 mg by mouth daily.    [provider]  aspirin EC 81 MG tablet Take 81 mg by mouth daily.    [provider]  atorvastatin (LIPITOR) 20 MG tablet Take 20 mg by mouth 4 (four) times a week. Lyn HollingsheadSun, Tues, Thur, and AOZSat 06/29/18   [provider]  cloNIDine (CATAPRES) 0.1 MG tablet Take 0.1 mg by mouth 2 (two) times daily.    [provider]  glimepiride (AMARYL) 4 MG tablet Take 8 mg by mouth daily.    [provider]  lisinopril (PRINIVIL,ZESTRIL) 40 MG tablet Take 40 mg by mouth daily.    [provider]  metFORMIN (GLUCOPHAGE-XR) 500 MG 24 hr tablet Take 1,500 mg by mouth every evening. 07/05/18  [provider]  pioglitazone (ACTOS) 15 MG tablet Take 15 mg by mouth daily.    [provider]  VITAMIN D PO Take 1 capsule by mouth daily.    [provider]    Family History No family history on file.  Social History Social History   Tobacco Use  . Smoking status: Former Smoker    Packs/day: 1.00    Types: Cigarettes  . Smokeless tobacco: Never Used  Substance Use Topics  . Alcohol use: Yes    Comment: occasional  . Drug use: Never     Allergies   Patient has no known allergies.   Review of Systems Review of Systems  Constitutional: Negative for fever.  HENT: Negative for sore throat.   Eyes: Negative for visual disturbance.  Respiratory: Negative for  shortness of breath.   Cardiovascular: Negative for chest pain.  Gastrointestinal: Negative for abdominal pain and bowel incontinence.  Genitourinary: Negative for bladder incontinence and dysuria.  Musculoskeletal: Positive for neck pain.  Skin: Positive for wound (abrasions left knee). Negative for rash.  Neurological: Positive for weakness.     Physical Exam Updated Vital Signs BP (!) 148/63 (BP Location: Left Arm)   Pulse 80   Temp 98 F (36.7 C) (Oral)   Resp 18   Ht 6' (1.829 m)   Wt 102.1 kg   SpO2 100%   BMI 30.52 kg/m   Physical Exam  Constitutional: He appears well-developed and well-nourished.  HENT:  Head: Normocephalic and atraumatic.  Eyes: Conjunctivae are normal.  Neck: Neck supple.  Cardiovascular: Normal rate, regular rhythm and normal heart sounds.  No murmur heard. Pulmonary/Chest: Effort normal and breath sounds normal. No respiratory distress.  Abdominal: Soft. There is no tenderness.  Musculoskeletal: He exhibits edema (1+ edema bilat LE) and tenderness (right knee). He exhibits no deformity.  Neurological: He is alert. No cranial nerve deficit. GCS eye subscore is 4. GCS verbal subscore is 5. GCS motor subscore is 6.  He has 4 out of 5 strength on the bilateral upper extremities with may be 3 out of 5 of his handgrips.  Right lower extremity 3 out of 5 and left lower extremity 4 out of 5.  Skin: Skin is warm and dry.  Psychiatric: He has a normal mood and affect.  Nursing note and vitals reviewed.    ED Treatments / Results  Labs (all labs ordered are listed, but only abnormal results are displayed) Labs Reviewed  BASIC METABOLIC PANEL - Abnormal; Notable for the following components:      Result Value   Glucose, Bld 119 (*)    All other components within normal limits  CBC WITH DIFFERENTIAL/PLATELET - Abnormal; Notable for the following components:   Neutro Abs 8.2 (*)    All other components within normal limits  PROTIME-INR  GLUCOSE,  CAPILLARY  CBC WITH DIFFERENTIAL/PLATELET  TYPE AND SCREEN  ABO/RH    EKG EKG Interpretation  Date/Time:  Tuesday August 12 2018 07:53:31 EST Ventricular Rate:  61 PR Interval:    QRS Duration: 102 QT Interval:  408 QTC Calculation: 411 R Axis:   35 Text Interpretation:  Sinus rhythm Repol abnrm suggests ischemia, inferior leads no prior to compare with Confirmed by Meridee Score [phone]) on 08/12/2018 8:28:16 AM   Radiology No results found.  Procedures Procedures (including critical care time)  Medications Ordered in ED Medications  povidone-iodine (BETADINE) 7.5 % scrub (has no administration in time range)  lactated ringers infusion ( Intravenous Anesthesia  Volume Adjustment 08/12/18 1901)  0.9 % irrigation (POUR BTL) (500 mLs Other Given 08/12/18 1814)  Surgifoam 100 with Thrombin 20,000 units (20 ml) topical solution ( Other Given 08/12/18 1701)  thrombin spray (5,000 Units Topical Given 08/12/18 1702)  bupivacaine-EPINEPHrine (MARCAINE W/ EPI) 0.25% -1:200000 (with pres) injection (9 mLs Infiltration Given 08/12/18 1645)  0.9 %  sodium chloride infusion ( Intravenous Transfusing/Transfer 08/12/18 1417)  ceFAZolin (ANCEF) IVPB 2g/100 mL premix (2 g Intravenous Given 08/12/18 1621)     Initial Impression / Assessment and Plan / ED Course  I have reviewed the triage vital signs and the nursing notes.  Pertinent labs & imaging results that were available during my care of the patient were reviewed by me and considered in my medical decision making (see chart for details).  Clinical Course as of Aug 12 1941  Tue Aug 12, 2018  1610 Placed a call into Dr. Yevette Edwards who asked if we would get some basic preop labs on the patient and keep him n.p.o. and start some IV fluids.  Anticipates coming down later this morning to evaluate the patient and take to the operating room.   [MB]  [id number] EKG normal sinus rhythm rate of 61 inferolateral T wave inversions no prior EKG to  compare with.   [MB]  1417 Still waiting on any call from the operating room that he can go upstairs.   [MB]    Clinical Course User Index [MB] Terrilee Files, MD     Final Clinical Impressions(s) / ED Diagnoses   Final diagnoses:  Cervical cord compression with myelopathy [facility])    ED Discharge Orders    None       Terrilee Files, MD 08/12/18 1944

## 2018-08-12 NOTE — ED Notes (Signed)
Surgery aware pt is still waiting in the hallway in the Emergency Department. Surgery charge RN will call this RN back with instructions regarding when to transfer patient out of the ED.

## 2018-08-12 NOTE — Progress Notes (Signed)
Of note, prior to surgery, I did discuss the patient's significantly compromised functional status with him and his wife.  We did discuss his profound upper and lower extremity weakness, and the fact that he has been falling frequently.  Certainly, these are symptoms from his rapidly progressive cervical myelopathy.  Given his profound weakness, and significantly compromised ability to ambulate and care for himself, we did discuss the utility of an inpatient rehabilitation consult.  I do feel that ultimately, a transfer to a rehabilitation facility, would be in his best interest, given his profound cervical myelopathy.  We will proceed at this point with physical therapy and occupational therapy as usual, with the tentative plan of proceeding with transfer to an outpatient rehabilitation facility.

## 2018-08-13 ENCOUNTER — Encounter (HOSPITAL_COMMUNITY): Payer: Self-pay | Admitting: Physical Medicine and Rehabilitation

## 2018-08-13 DIAGNOSIS — E119 Type 2 diabetes mellitus without complications: Secondary | ICD-10-CM

## 2018-08-13 DIAGNOSIS — I10 Essential (primary) hypertension: Secondary | ICD-10-CM

## 2018-08-13 DIAGNOSIS — G959 Disease of spinal cord, unspecified: Secondary | ICD-10-CM

## 2018-08-13 MED ORDER — PANTOPRAZOLE SODIUM 40 MG PO TBEC
40.0000 mg | DELAYED_RELEASE_TABLET | Freq: Every day | ORAL | Status: DC
  Administered 2018-08-13 – 2018-08-18 (×6): 40 mg via ORAL
  Filled 2018-08-13 (×6): qty 1

## 2018-08-13 NOTE — Evaluation (Signed)
Physical Therapy Evaluation Patient Details Name: Brandon Collins MRN: [id number] DOB: 02-Aug-1951 Today's Date: 08/13/2018   History of Present Illness  67 yo male s/p 4 level ACDF. PMH includes: HTN, diabetes and chronic cervical pain with some UE weakness   Clinical Impression  Patient received in bed, pleasant and willing to participate in therapy session. He was able to scoot to EOB with min guard and significant increased effort, anxious and requiring cues for safety and sequencing during other mobility this session. Attempted sit to stand with RW and MaxAx2, unable to come to full standing position and with postural impairments and significant weakness in hip extensors and R LE which partially buckled during transfers today. Performed second sit to stand into bariatric stedy with maxAx2, pivoted to recliner in stedy, and was able perform third partial sit to stand with maxAx2 to clear steady seats but required heavy assist for safe controlled lower into recliner. He was left up in recliner with all needs met, chair alarm active, and nursing staff aware of technique for transfer back to bed. He will continue to benefit from skilled PT services in the acute setting as well as intensive therapies in the CIR setting moving forward.     Follow Up Recommendations CIR    Equipment Recommendations  Other (comment)(defer to next venue )    Recommendations for Other Services       Precautions / Restrictions Precautions Precautions: Cervical;Fall Precaution Booklet Issued: No Required Braces or Orthoses: Cervical Brace Cervical Brace: Hard collar;At all times Restrictions Weight Bearing Restrictions: No      Mobility  Bed Mobility Overal bed mobility: Needs Assistance Bed Mobility: Supine to Sit     Supine to sit: Min guard;HOB elevated     General bed mobility comments: close min guard for safety. Able to uses BUE to scoot to full EOB position. Extra time and  effort  Transfers Overall transfer level: Needs assistance Equipment used: Rolling walker (2 wheeled) Transfers: Sit to/from Stand           General transfer comment: sit>partial stand with +2 max A 3x throughout session. utilized stedy for tranfer to [employer]. R knee buckling when using walker and when in stedy   Ambulation/Gait             General Gait Details: deferred   Stairs            Wheelchair Mobility    Modified Rankin (Stroke Patients Only)       Balance Overall balance assessment: Needs assistance Sitting-balance support: Bilateral upper extremity supported;Feet supported Sitting balance-Leahy Scale: Fair       Standing balance-Leahy Scale: Zero Standing balance comment: unable to come to full standing position this session                             Pertinent Vitals/Pain Pain Assessment: No/denies pain    Home Living Family/patient expects to be discharged to:: Private residence Living Arrangements: Spouse/significant other Available Help at Discharge: Family Type of Home: House Home Access: Stairs to enter Entrance Stairs-Rails: Psychiatric nurse of Steps: 3 Home Layout: One level Home Equipment: Environmental consultant - 4 wheels      Prior Function Level of Independence: Independent         Comments: works as a Occupational hygienist        Extremity/Trunk Assessment   Upper Extremity Assessment Upper Extremity Assessment: Defer  to OT evaluation    Lower Extremity Assessment Lower Extremity Assessment: Generalized weakness    Cervical / Trunk Assessment Cervical / Trunk Assessment: Other exceptions Cervical / Trunk Exceptions: s/p 4 level ACDF  Communication   Communication: No difficulties  Cognition Arousal/Alertness: Awake/alert Behavior During Therapy: Anxious;WFL for tasks assessed/performed Overall Cognitive Status: Within Functional Limits for tasks assessed                                         General Comments General comments (skin integrity, edema, etc.): Spouse present for part of session    Exercises     Assessment/Plan    PT Assessment Patient needs continued PT services  PT Problem List Decreased strength;Decreased activity tolerance;Decreased safety awareness;Decreased balance;Decreased mobility;Decreased coordination       PT Treatment Interventions DME instruction;Balance training;Gait training;Neuromuscular re-education;Stair training;Functional mobility training;Patient/family education;Therapeutic activities;Therapeutic exercise    PT Goals (Current goals can be found in the Care Plan section)  Acute Rehab PT Goals Patient Stated Goal: rehab then home. regain independence PT Goal Formulation: With patient Time For Goal Achievement: 08/27/18 Potential to Achieve Goals: Good    Frequency Min 3X/week   Barriers to discharge        Co-evaluation PT/OT/SLP Co-Evaluation/Treatment: Yes Reason for Co-Treatment: For patient/therapist safety;To address functional/ADL transfers PT goals addressed during session: Mobility/safety with mobility;Proper use of DME OT goals addressed during session: ADL's and self-care;Strengthening/ROM;Proper use of Adaptive equipment and DME       AM-PAC PT "6 Clicks" Mobility  Outcome Measure Help needed turning from your back to your side while in a flat bed without using bedrails?: A Little Help needed moving from lying on your back to sitting on the side of a flat bed without using bedrails?: A Lot Help needed moving to and from a bed to a chair (including a wheelchair)?: A Lot Help needed standing up from a chair using your arms (e.g., wheelchair or bedside chair)?: Total Help needed to walk in hospital room?: Total Help needed climbing 3-5 steps with a railing? : Total 6 Click Score: 10    End of Session Equipment Utilized During Treatment: Gait belt Activity Tolerance: Patient  limited by fatigue Patient left: in chair;with call bell/phone within reach;with chair alarm set Nurse Communication: Mobility status PT Visit Diagnosis: Unsteadiness on feet (R26.81);Muscle weakness (generalized) (M62.81);Difficulty in walking, not elsewhere classified (R26.2)    Time: 1761-6073 PT Time Calculation (min) (ACUTE ONLY): 36 min   Charges:   PT Evaluation $PT Eval Moderate Complexity: 1 Mod          Deniece Ree PT, DPT, CBIS  Supplemental Physical Therapist [place]    Pager [phone] Acute Rehab Office [phone]

## 2018-08-13 NOTE — Care Management Note (Signed)
Case Management Note  Patient Details  Name: Brandon Collins MRN: [id number] Date of Birth: 21-May-1951  Subjective/Objective:                    Action/Plan:  Will continue to follow for discharge needs. Expected Discharge Date:                  Expected Discharge Plan:  Home/Self Care  In-House Referral:     Discharge planning Services  CM Consult  Post Acute Care Choice:    Choice offered to:     DME Arranged:    DME Agency:     HH Arranged:    HH Agency:     Status of Service:  In process, will continue to follow  If discussed at Long Length of Stay Meetings, dates discussed:    Additional Comments:  Brandon Collins, Brandon Collington Marie, RN 08/13/2018, 8:27 AM

## 2018-08-13 NOTE — Evaluation (Signed)
Occupational Therapy Evaluation Patient Details Name: Brandon OchsCarl S Mcphearson MRN: [id number] DOB: 1951-06-02 Today's Date: 08/13/2018    History of Present Illness 67 yo male s/p 4 level ACDF. PMH includes: HTN, diabetes and chronic cervical pain with some UE weakness    Clinical Impression   Pt admitted with the above diagnoses and presents with below problem list. Pt will benefit from continued acute OT to address the below listed deficits and maximize independence with basic ADLs. At baseline, pt is independent with ADLs, works as a Museum/gallery exhibitions officerforklift driver. Pt currently max +2 physical assistance with LB ADLs in sit>partial stand, mod A for UB ADLs. Stedy utilized to transfer into recliner with pt completing 3x sit>partial stands throughout session. Spouse present for part of session.      Follow Up Recommendations  CIR    Equipment Recommendations  Other (comment)(defer to next venue. )    Recommendations for Other Services Rehab consult     Precautions / Restrictions Precautions Precautions: Cervical;Fall Precaution Booklet Issued: No Required Braces or Orthoses: Cervical Brace Cervical Brace: Hard collar;At all times Restrictions Weight Bearing Restrictions: No      Mobility Bed Mobility Overal bed mobility: Needs Assistance Bed Mobility: Supine to Sit     Supine to sit: Min guard;HOB elevated     General bed mobility comments: close min guard for safety. Able to uses BUE to scoot to full EOB position. Extra time and effort  Transfers Overall transfer level: Needs assistance Equipment used: Rolling walker (2 wheeled) Transfers: Sit to/from Stand           General transfer comment: sit>partial stand with +2 max A 3x throughout session. utilized stedy for tranfer to Best Buyrecliner    Balance Overall balance assessment: Needs assistance Sitting-balance support: Bilateral upper extremity supported;Feet supported Sitting balance-Leahy Scale: Fair         Standing balance  comment: unable to come to full standing position this session                           ADL either performed or assessed with clinical judgement   ADL Overall ADL's : Needs assistance/impaired Eating/Feeding: Sitting;Minimal assistance;Moderate assistance Eating/Feeding Details (indicate cue type and reason): reports difficulty holding onto fork Grooming: Moderate assistance   Upper Body Bathing: Moderate assistance;Sitting   Lower Body Bathing: +2 for physical assistance;Maximal assistance;Sit to/from stand Lower Body Bathing Details (indicate cue type and reason): partial stand Upper Body Dressing : Maximal assistance;Sitting   Lower Body Dressing: Maximal assistance;+2 for physical assistance;Sit to/from stand Lower Body Dressing Details (indicate cue type and reason): partial stand               General ADL Comments: Pt completed partial sit<>stand 3x throughout session. Sat EOB about 8 minutes, min guard level.      Vision         Perception     Praxis      Pertinent Vitals/Pain       Hand Dominance     Extremity/Trunk Assessment Upper Extremity Assessment Upper Extremity Assessment: Generalized weakness(stronger on LUE, numbess in B thumbs. reports dropping items)   Lower Extremity Assessment Lower Extremity Assessment: Defer to PT evaluation   Cervical / Trunk Assessment Cervical / Trunk Assessment: Other exceptions Cervical / Trunk Exceptions: s/p 4 level ACDF   Communication Communication Communication: No difficulties   Cognition Arousal/Alertness: Awake/alert Behavior During Therapy: Anxious;WFL for tasks assessed/performed Overall Cognitive Status: Within Functional Limits for  tasks assessed                                     General Comments  Spouse present for part of session    Exercises     Shoulder Instructions      Home Living Family/patient expects to be discharged to:: Private residence Living  Arrangements: Spouse/significant other Available Help at Discharge: Family Type of Home: House Home Access: Stairs to enter Secretary/administrator of Steps: 3 Entrance Stairs-Rails: Right;Left Home Layout: One level     Bathroom Shower/Tub: Chief Strategy Officer: Standard     Home Equipment: Environmental consultant - 4 wheels          Prior Functioning/Environment Level of Independence: Independent        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 regain independence OT Goal Formulation: With patient/family Time For Goal Achievement: 08/27/18 Potential to Achieve Goals: Good ADL Goals Pt Will Perform Eating: with modified independence;with adaptive utensils;sitting Pt Will Perform Grooming: with set-up;sitting Pt Will Perform Upper Body Bathing: with modified independence;sitting Pt Will Perform Lower Body Bathing: sit to/from stand;with max assist Pt Will Perform Upper Body Dressing: with modified independence;sitting Pt Will Perform Lower Body Dressing: with max assist;sit to/from stand Pt Will Transfer to Toilet: stand pivot transfer;with mod assist;bedside commode Pt Will Perform Toileting - Clothing Manipulation and hygiene: with max assist;sit to/from stand  OT Frequency: Min 3X/week   Barriers to D/C:            Co-evaluation PT/OT/SLP Co-Evaluation/Treatment: Yes Reason for Co-Treatment: For  patient/therapist safety;To address functional/ADL transfers   OT goals addressed during session: ADL's and self-care;Strengthening/ROM;Proper use of Adaptive equipment and DME      AM-PAC OT "6 Clicks" Daily Activity     Outcome Measure Help from another person eating meals?: A Little Help from another person taking care of personal grooming?: A Lot Help from another person toileting, which includes using toliet, bedpan, or urinal?: Total Help from another person bathing (including washing, rinsing, drying)?: A Lot Help from another person to put on and taking off regular upper body clothing?: A Lot Help from another person to put on and taking off regular lower body clothing?: Total 6 Click Score: 11   End of Session Equipment Utilized During Treatment: Gait belt;Rolling walker;Other (comment);Cervical collar(stedy) Nurse Communication: Mobility status  Activity Tolerance: Patient tolerated treatment well Patient left: in chair;with call bell/phone within reach;with chair alarm set  OT Visit Diagnosis: Other abnormalities of gait and mobility (R26.89);Muscle weakness (generalized) (M62.81);Other symptoms and signs involving the nervous system (R29.898);Pain                Time: 1610-9604 OT Time Calculation (min): 36 min Charges:  OT General Charges $OT Visit: 1 Visit OT Evaluation $OT Eval Low Complexity: 1 Low  Raynald Kemp, OT Acute Rehabilitation Services Pager: [phone] Office: [phone]   Pilar Grammes 08/13/2018, 2:01 PM

## 2018-08-13 NOTE — Progress Notes (Signed)
    Patient doing well PO day 1 after 4 level ACDF for spinal cord compression and prominent progressive myelopathy symptomatology.  He states that his arms and legs actually feel much better already today.  He has more sensation and feeling coming back into his arms and hands.  He does continue to have some numbness tingling and weakness in his bilateral upper and lower extremities.  Again he states he feels he is already much improved.  He has not yet been out of bed.  His chief complaint really is throat soreness and difficulty with phlegm and coughing. He states that he is very hungry and ready to attempt some food.  He did not sleep very well.  He has been wearing his hard collar.  He denies any shortness of breath.     Physical Exam: Vitals:   08/13/18 0138 08/13/18 0547  BP: (!) 169/74 (!) 171/75  Pulse: 99 83  Resp:    Temp: 98.2 F (36.8 C) 98.3 F (36.8 C)  SpO2: 95% 98%    Dressing in place, neck is soft and nontender.  He is sitting upright and breathing normally.  He is good perfusion in the bilateral upper and lower extremities.  He appears comfortable. NVI  POD #1 s/p 4 level ACDF for prominent myelopathy symptomatology in the presence of severe multilevel spinal cord compression  - improving bilateral upper and lower extremity numbness and weakness - up with PT/OT, encourage ambulation - plan is to discharge to inpatient rehab to work on his independence and ambulation secondary to prominent myelopathic deficits. - Percocet for pain, Valium for muscle spasms  - scripts are printed and signed in the patient's chart for home - patient can utilize shower collar after 5 days and remove the outer bulky dressing and shower normally over Steri-Strips at 5 days postop - d/c to inpatient rehab when available - f/u in 2 weeks at the office

## 2018-08-13 NOTE — Consult Note (Signed)
Physical Medicine and Rehabilitation Consult   Reason for Consult: Cervical myelopathy Referring Physician: Dr. Yevette Edwardsumonski    HPI: Brandon Collins is a 67 y.o. 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  He was working fulltime till two weeks ago when he started having significant decline. He was admitted via ED on 08/12/2018 with new onset BUE weakness, LLE weakness, balance deficits with multiple falls as well as difficulty with basic ADLs.  He was taken to the OR for ACDF C3-C7 by Dr. Yevette Edwardsumonski.  Therapy evaluations to be done today and CIR recommended due to recent functional decline.    Review of Systems  Constitutional: Negative for chills and fever.  HENT: Positive for hearing loss.   Eyes: Negative for blurred vision and double vision.  Respiratory: Positive for cough and sputum production. Negative for shortness of breath.   Cardiovascular: Negative for chest pain and palpitations.  Gastrointestinal: Negative for abdominal pain, heartburn and nausea.  Genitourinary: Negative for dysuria and urgency.  Musculoskeletal: Positive for myalgias and neck pain.  Skin: Negative for itching and rash.  Neurological: Positive for sensory change, focal weakness and weakness. Negative for dizziness and headaches.     Past Medical History:  Diagnosis Date  . Arthritis    "all over" (08/12/2018)  . High cholesterol   . Hypertension   . Type II diabetes mellitus (HCC)     Past Surgical History:  Procedure Laterality Date  . ANTERIOR CERVICAL DECOMP/DISCECTOMY FUSION  08/12/2018   CERVICAL 3-4, CERVICAL 4-5, CERVICAL 5-6, CERVICAL 6-7 WITH INSTRUMENTATION AND ALLOGRAFT  . BACK SURGERY    . INGUINAL HERNIA REPAIR  1958   "? side"  . KNEE ARTHROSCOPY Right   . PILONIDAL CYST EXCISION      Family History  Problem Relation Age of Onset  . Cancer Mother   . Stroke Father    multiple     Social History:   Married. Was working as a Lobbyistfork lift driver. He reports that he has quit smoking about 25 years ago. Marland Kitchen. His smoking use included cigarettes. He has a 30.00 pack-year smoking history. He has never used smokeless tobacco. He reports that he drank alcohol. He reports that he does not use drugs.    Allergies: No Known Allergies    Medications Prior to Admission  Medication Sig Dispense Refill  . acetaminophen (TYLENOL) 500 MG tablet Take 1,000 mg by mouth daily as needed for moderate pain or headache.    Marland Kitchen. amLODipine (NORVASC) 10 MG tablet Take 10 mg by mouth daily.    Marland Kitchen. aspirin EC 81 MG tablet Take 81 mg by mouth daily.    Marland Kitchen. atorvastatin (LIPITOR) 20 MG tablet Take 20 mg by mouth 4 (four) times a week. Evern BioSun, Tues, Thur, and Sat  1  . cloNIDine (CATAPRES) 0.1 MG tablet Take 0.1 mg by mouth 2 (two) times daily.    Marland Kitchen. glimepiride (AMARYL) 4 MG tablet Take 8 mg by mouth daily.    Marland Kitchen. lisinopril (PRINIVIL,ZESTRIL) 40 MG tablet Take 40 mg by mouth daily.    . metFORMIN (GLUCOPHAGE-XR) 500 MG 24 hr tablet Take 1,500 mg by mouth every evening.  1  . pioglitazone (ACTOS) 15 MG tablet Take 15 mg by mouth daily.    Marland Kitchen. VITAMIN D PO Take 1 capsule by mouth daily.      Home: Home Living Family/patient expects to be  discharged to:: Private residence Living Arrangements: Spouse/significant other  Functional History:   Functional Status:  Mobility:          ADL:    Cognition: Cognition Orientation Level: Oriented X4    Blood pressure (!) 171/75, pulse 83, temperature 98.3 F (36.8 C), temperature source Oral, resp. rate (!) 24, height 6' (1.829 m), weight 102.1 kg, SpO2 98 %. Physical Exam  Nursing note and vitals reviewed. Constitutional: He is oriented to person, place, and time. He appears well-developed and well-nourished.  Sitting up in bed. NAD  HENT:  Head: Normocephalic and atraumatic.  Eyes: Pupils are equal, round, and reactive to light.  Neck:    Neck immobilized by collar.   Cardiovascular: Normal rate, regular rhythm and normal heart sounds. Exam reveals no friction rub.  No murmur heard. Respiratory: Effort normal and breath sounds normal. No stridor. No respiratory distress. He has no wheezes.  GI: Soft. Bowel sounds are normal. He exhibits no distension. There is no tenderness.  Neurological: He is alert and oriented to person, place, and time.  Mild dysarthria with wet voice.   Psychiatric: He has a normal mood and affect.  Motor strength is 3- right deltoid 4- left deltoid 4- right bicep tricep 4 at the left bicep tricep 4 bilateral grip 4 at the hand intrinsics 3- bilateral hip flexors 3- bilateral knee extensors 4- bilateral ankle dorsiflexor Sensation reduced bilateral C6 intact bilateral C5 C7-C8 pinprick.  Intact bilateral lower limb Tone no evidence of clonus Sitting balance is good  Results for orders placed or performed during the hospital encounter of 08/12/18 (from the past 24 hour(s))  Glucose, capillary     Status: None   Collection Time: 08/12/18  2:34 PM  Result Value Ref Range   Glucose-Capillary 98 70 - 99 mg/dL  Glucose, capillary     Status: Abnormal   Collection Time: 08/12/18  9:10 PM  Result Value Ref Range   Glucose-Capillary 130 (H) 70 - 99 mg/dL   Dg Cervical Spine 1 View  Result Date: 08/12/2018 CLINICAL DATA:  Intraoperative localization for ACDF at C3-4 through C6-7. EXAM: DG C-ARM 61-120 MIN; DG CERVICAL SPINE - 1 VIEW COMPARISON:  None available. FINDINGS: Intraoperative fluoroscopic lateral views of the cervical spine demonstrate ACDF at C3-4, C4-5, C5-6, and C6-7. Hardware appears well positioned without complication. Alignment normal. No adverse features. Fluoroscopic time equals 17 seconds. IMPRESSION: Fluoroscopic intraoperative localization for ACDF at C3-4 through C6-7. Electronically Signed   By: Rise Mu M.D.   On: 08/12/2018 22:19   Dg C-arm 1-60 Min  Result Date:  08/12/2018 CLINICAL DATA:  Intraoperative localization for ACDF at C3-4 through C6-7. EXAM: DG C-ARM 61-120 MIN; DG CERVICAL SPINE - 1 VIEW COMPARISON:  None available. FINDINGS: Intraoperative fluoroscopic lateral views of the cervical spine demonstrate ACDF at C3-4, C4-5, C5-6, and C6-7. Hardware appears well positioned without complication. Alignment normal. No adverse features. Fluoroscopic time equals 17 seconds. IMPRESSION: Fluoroscopic intraoperative localization for ACDF at C3-4 through C6-7. Electronically Signed   By: Rise Mu M.D.   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 Does the need for close, 24 hr/day medical supervision in concert with the patient's rehab needs make it unreasonable for this patient to be served in a less intensive setting? Yes 2. Co-Morbidities requiring supervision/potential complications: Hypertension, type 2 diabetes 3. Due to bladder management, bowel management, safety, skin/wound care, disease management, medication administration,  pain management and patient education, does the patient require 24 hr/day rehab nursing? Yes 4. Does the patient require coordinated care of a physician, rehab nurse, PT (1-2 hrs/day, 5 days/week) and OT (1-2 hrs/day, 5 days/week) to address physical and functional deficits in the context of the above medical diagnosis(es)? Yes Addressing deficits in the following areas: balance, endurance, locomotion, strength, transferring, bowel/bladder control, bathing, dressing, feeding, grooming, toileting and psychosocial support 5. Can the patient actively participate in an intensive therapy program of at least 3 hrs of therapy per day at least 5 days per week? Potentially 6. The potential for patient to make measurable gains while on inpatient rehab is excellent 7. Anticipated functional outcomes upon discharge from inpatient rehab are modified  independent and supervision  with PT, modified independent and supervision with OT, n/a with SLP. 8. Estimated rehab length of stay to reach the above functional goals is: 14-18d 9. Anticipated D/C setting: Home 10. Anticipated post D/C treatments: HH therapy 11. Overall Rehab/Functional Prognosis: excellent  RECOMMENDATIONS: This patient's condition is appropriate for continued rehabilitative care in the following setting: CIR Patient has agreed to participate in recommended program. Yes Note that insurance prior authorization may be required for reimbursement for recommended care.  Comment:  "I have personally performed a face to face diagnostic evaluation of this patient.  Additionally, I have reviewed and concur with the physician assistant's documentation above." Erick Colace M.D. [facility] FAAPM&R (Sports Med, Neuromuscular Med) Diplomate Am Board of Electrodiagnostic Med  Jacquelynn Cree, [name] 08/13/2018

## 2018-08-13 NOTE — Progress Notes (Signed)
IP rehab admissions - I have opened the case with [employer] requesting acute inpatient rehab admission.  We likely will not hear back from insurance carrier until Monday regarding request.  Call me for questions.  [phone]

## 2018-08-14 NOTE — Progress Notes (Signed)
Subjective: 2 Days Post-Op Procedure(s) (LRB): ANTERIOR CERVICAL DECOMPRESSION FUSION CERVICAL 3-4, CERVICAL 4-5, CERVICAL 5-6, CERVICAL 6-7 WITH INSTRUMENTATION AND ALLOGRAFT (N/A)  Patient is doing well postoperative day 4:02 level ACDF for spinal cord compression and prominent progressive myelopathy symptomatology.  Patient is doing well today.  He continues to express that the bilateral upper and lower extremity numbness and weakness seems to be improved.  He does complain of some ankle dorsiflexion weakness on the right but otherwise doing well.  He was able to get up out of bed with the aid of physical therapy for the changes bedsheets and did well.  He is awaiting placement in an inpatient rehab.  Objective: Vital signs in last 24 hours: Temp:  [97.6 F (36.4 C)-98.4 F (36.9 C)] 98.2 F (36.8 C) (11/28 0523) Pulse Rate:  [69-87] 69 (11/28 0523) Resp:  [16-20] 16 (11/28 0523) BP: (104-164)/(64-89) 104/64 (11/28 0523) SpO2:  [97 %-100 %] 100 % (11/28 0523)    Cervical collar in place.  Dressing intact, neck is soft and nontender.  He is sitting upright and breathing normally.  He is good perfusion in the bilateral upper and lower extremities.  He appears comfortable. Some weakness with ankle dorsiflexion on the right.  Able to lift bilateral upper extremities albeit weak.  POD #2 s/p 4 level ACDF for prominent myelopathy symptomatology in the presence of severe multilevel spinal cord compression  - improving bilateral upper and lower extremity numbness and weakness - up with PT/OT, encourage ambulation - plan is to discharge to inpatient rehab to work on his independence and ambulation secondary to prominent myelopathic deficits. - Percocet for pain, Valium for muscle spasms             - scripts are printed and signed in the patient's chart for home - patient can utilize shower collar after 5 days and remove the outer bulky dressing and shower normally over Steri-Strips at 5  days postop - d/c to inpatient rehab when available - f/u in 2 weeks at the office    Brandon HartChristopher R  08/14/2018, 8:51 AM

## 2018-08-15 MED FILL — Thrombin (Recombinant) For Soln 20000 Unit: CUTANEOUS | Qty: 1 | Status: AC

## 2018-08-15 NOTE — Plan of Care (Signed)
  Problem: Education: Goal: Knowledge of General Education information will improve Description: Including pain rating scale, medication(s)/side effects and non-pharmacologic comfort measures Outcome: Progressing   Problem: Coping: Goal: Level of anxiety will decrease Outcome: Progressing   Problem: Elimination: Goal: Will not experience complications related to urinary retention Outcome: Progressing   

## 2018-08-15 NOTE — Progress Notes (Signed)
Subjective: 3 Days Post-Op Procedure(s) (LRB): ANTERIOR CERVICAL DECOMPRESSION FUSION CERVICAL 3-4, CERVICAL 4-5, CERVICAL 5-6, CERVICAL 6-7 WITH INSTRUMENTATION AND ALLOGRAFT (N/A)   Patient is doing well this morning.  He states he is swallowing well.  He feels like his sensation and motor strength is slightly better than yesterday.  Especially in the right lower extremity.  He did not get out of bed yesterday.  He is anticipating inpatient rehab soon.  Objective: Vital signs in last 24 hours: Temp:  [97.5 F (36.4 C)-99.4 F (37.4 C)] 97.5 F (36.4 C) (11/29 0521) Pulse Rate:  [67-87] 67 (11/29 0521) Resp:  [18-22] 18 (11/29 0521) BP: (118-170)/(57-73) 131/71 (11/29 0521) SpO2:  [98 %-100 %] 100 % (11/29 0521)  Intake/Output from previous day: 11/28 0701 - 11/29 0700 In: 480 [P.O.:480] Out: 2550 [Urine:2550] Intake/Output this shift: No intake/output data recorded.  Cervical collar in place.  Dressing intact, neck is soft and nontender. He is sitting upright and breathing normally. He is drinking liquid without difficulty. He is good perfusion in the bilateral upper and lower extremities. He appears comfortable. Some weakness with ankle dorsiflexion on the right.  Able to lift bilateral upper extremities albeit weak.  POD #3 s/p4 level ACDF for prominent myelopathy symptomatology in the presence of severe multilevel spinal cord compression  - improving bilateral upper and lower extremity numbness and weakness - up with PT/OT, encourage ambulation - plan is to discharge to inpatient rehab to work on his independence and ambulation secondary to prominent myelopathic deficits. - Percocet for pain, Valium for muscle spasms - scripts are printed and signed in the patient's chart for home - patient can utilize shower collar after 5 days and remove the outer bulky dressing and shower normally over Steri-Strips at 5 days postop - d/cto inpatient rehab when  available -f/u in 2 weeksat the office   Brandon HartChristopher R Hobart Collins 08/15/2018, 7:54 AM

## 2018-08-15 NOTE — Progress Notes (Signed)
Physical Therapy Treatment Patient Details Name: Brandon Collins MRN: [id number] DOB: February 17, 1951 Today's Date: 08/15/2018    History of Present Illness 67 yo male s/p 4 level ACDF. PMH includes: HTN, diabetes and chronic cervical pain with some UE weakness     PT Comments    Patient seen for mobility progression. Pt is motivated to participate in therapy. This session focused on bed mobility and functional transfers. Pt requires min/mod A +2 for safety with bed mobility. Max A +2 for attempts to stand from EOB. Pt unable to progress to standing this session due to c/o R LE pain. Pt is anxious with mobility given history of falls per pt. Spouse present. Continue to progress as tolerated.     Follow Up Recommendations  CIR     Equipment Recommendations  Other (comment)(defer to next venue )    Recommendations for Other Services       Precautions / Restrictions Precautions Precautions: Cervical;Fall Precaution Booklet Issued: No Required Braces or Orthoses: Cervical Brace Cervical Brace: Hard collar;At all times    Mobility  Bed Mobility Overal bed mobility: Needs Assistance Bed Mobility: Supine to Sit;Sit to Sidelying;Rolling Rolling: Min assist   Supine to sit: HOB elevated;Min assist   Sit to sidelying: Mod assist;+2 for physical assistance General bed mobility comments: assist to bring R hip to EOB with use of bed pad and to bring bilat LE into bed end of session; assist to position once in supine; cues for sequencing and technique required  Transfers Overall transfer level: Needs assistance Equipment used: (+2 face to face with use of gait belt and bed pad) Transfers: Sit to/from Stand Sit to Stand: Max assist;+2 physical assistance;From elevated surface         General transfer comment: attempted sit to stands X 2 trials with knees blocked and use of bed pad for hip extension; pt unable to lift from EOB and very anxious due to c/o R LE pain   Ambulation/Gait              General Gait Details: unable    Stairs             Wheelchair Mobility    Modified Rankin (Stroke Patients Only)       Balance Overall balance assessment: Needs assistance Sitting-balance support: Bilateral upper extremity supported;Feet supported Sitting balance-Leahy Scale: Fair                                      Cognition Arousal/Alertness: Awake/alert Behavior During Therapy: Anxious;WFL for tasks assessed/performed Overall Cognitive Status: Within Functional Limits for tasks assessed                                        Exercises      General Comments        Pertinent Vitals/Pain Pain Assessment: Faces Faces Pain Scale: Hurts whole lot Pain Location: R LE (knee) Pain Descriptors / Indicators: Aching;Constant;Grimacing;Guarding;Moaning("it's like a toothache") Pain Intervention(s): Limited activity within patient's tolerance;Monitored during session;Premedicated before session;Repositioned    Home Living                      Prior Function            PT Goals (current goals can now be found in the care  plan section) Acute Rehab PT Goals Patient Stated Goal: rehab then home. regain independence Progress towards PT goals: Progressing toward goals    Frequency    Min 3X/week      PT Plan Current plan remains appropriate    Co-evaluation              AM-PAC PT "6 Clicks" Mobility   Outcome Measure  Help needed turning from your back to your side while in a flat bed without using bedrails?: A Lot Help needed moving from lying on your back to sitting on the side of a flat bed without using bedrails?: A Lot Help needed moving to and from a bed to a chair (including a wheelchair)?: A Lot Help needed standing up from a chair using your arms (e.g., wheelchair or bedside chair)?: Total Help needed to walk in hospital room?: Total Help needed climbing 3-5 steps with a railing? :  Total 6 Click Score: 9    End of Session Equipment Utilized During Treatment: Gait belt Activity Tolerance: Patient limited by pain Patient left: with call bell/phone within reach;in bed;with family/visitor present Nurse Communication: Mobility status PT Visit Diagnosis: Unsteadiness on feet (R26.81);Muscle weakness (generalized) (M62.81);Difficulty in walking, not elsewhere classified (R26.2)     Time: [id number] PT Time Calculation (min) (ACUTE ONLY): 20 min  Charges:  $Therapeutic Activity: 8-22 mins                     Erline LevineKellyn Teyona Nichelson, PTA Acute Rehabilitation Services Pager: [phone] Office: [phone]     Carolynne EdouardKellyn R Starlina Lapre 08/15/2018, 4:57 PM

## 2018-08-16 LAB — GLUCOSE, CAPILLARY
Glucose-Capillary: 25 mg/dL — CL (ref 70–99)
Glucose-Capillary: 36 mg/dL — CL (ref 70–99)

## 2018-08-16 NOTE — Progress Notes (Signed)
Hypoglycemic Event  CBG: 25  Treatment: 15 GM carbohydrate snack  Symptoms: Sweaty and Shaky  Follow-up CBG: Time:0045 CBG Result:71  Possible Reasons for Event: Inadequate meal intake  Comments/MD notified:NA    Dailin Sosnowski L Teodoro KilBlanchard

## 2018-08-16 NOTE — Progress Notes (Signed)
Patient really seems to struggle when taking PO meds.  Will continue to monitor.

## 2018-08-16 NOTE — Progress Notes (Signed)
Subjective: 4 Days Post-Op Procedure(s) (LRB): ANTERIOR CERVICAL DECOMPRESSION FUSION CERVICAL 3-4, CERVICAL 4-5, CERVICAL 5-6, CERVICAL 6-7 WITH INSTRUMENTATION AND ALLOGRAFT (N/A)  Patient is doing well.  He continues to see improvement.  Main complaint is continued right lower extremely weakness.  He is able to eat and drink without difficulty.  Pain is controlled.  Objective: Vital signs in last 24 hours: Temp:  [97.5 F (36.4 C)-98.7 F (37.1 C)] 98.4 F (36.9 C) (11/30 0416) Pulse Rate:  [82-95] 88 (11/30 0416) Resp:  [20] 20 (11/30 0416) BP: (139-167)/(62-78) 167/78 (11/30 0416) SpO2:  [96 %-100 %] 98 % (11/30 0416)  Intake/Output from previous day: 11/29 0701 - 11/30 0700 In: -  Out: 175 [Urine:75; Stool:100] Intake/Output this shift:  Cervical collar in place.Dressingintact, neck is soft and nontender. He is sitting upright and breathing normally. He has good perfusion in the bilateral upper and lower extremities. He appears comfortable. Some continued weakness with ankle dorsiflexion on the right. Able to lift bilateral upper extremities albeit weak.  POD #4s/p4 level ACDF for prominent myelopathy symptomatology in the presence of severe multilevel spinal cord compression  - improving bilateral upper and lower extremity numbness and weakness - up with PT/OT, encourage ambulation - plan is to discharge to inpatient rehab to work on his independence and ambulation secondary to prominent myelopathic deficits. - Percocet for pain, Valium for muscle spasms - scripts are printed and signed in the patient's chart for home - patient can utilize shower collar after 5 days and remove the outer bulky dressing and shower normally over Steri-Strips at 5 days postop - d/cto inpatient rehab when available -f/u in 2 weeksat the office    Brandon Collins 08/16/2018, 9:27 AM

## 2018-08-16 NOTE — Progress Notes (Signed)
Patient experienced extreme difficulty swallowing thin liquids accompanied with coughing

## 2018-08-17 LAB — GLUCOSE, CAPILLARY
Glucose-Capillary: 117 mg/dL — ABNORMAL HIGH (ref 70–99)
Glucose-Capillary: 33 mg/dL — CL (ref 70–99)
Glucose-Capillary: 58 mg/dL — ABNORMAL LOW (ref 70–99)
Glucose-Capillary: 70 mg/dL (ref 70–99)
Glucose-Capillary: 71 mg/dL (ref 70–99)

## 2018-08-17 NOTE — Progress Notes (Signed)
Subjective: 5 Days Post-Op Procedure(s) (LRB): ANTERIOR CERVICAL DECOMPRESSION FUSION CERVICAL 3-4, CERVICAL 4-5, CERVICAL 5-6, CERVICAL 6-7 WITH INSTRUMENTATION AND ALLOGRAFT (N/A)  Patient states he is doing well this morning.  Last evening he had a hypoglycemic event which improved with a glucose snack.  Also had some difficulty with thin liquids swallowing and a coughing event but states he has had no difficulty this morning with thin liquids.  He would like to get up to the chair if possible today.  He is awaiting placement at inpatient rehab.  Objective: Vital signs in last 24 hours: Temp:  [97.7 F (36.5 C)-98.6 F (37 C)] 98.6 F (37 C) (12/01 0426) Pulse Rate:  [82-92] 91 (12/01 0426) Resp:  [17-20] 17 (12/01 0426) BP: (146-171)/(66-89) 171/72 (12/01 0426) SpO2:  [92 %-97 %] 96 % (12/01 0426)  Intake/Output from previous day: 11/30 0701 - 12/01 0700 In: 10 [P.O.:10] Out: 1675 [Urine:1675] Intake/Output this shift: No intake/output data recorded.  Cervical collar in place.Dressingintact, neck is soft and nontender. He is sitting upright and breathing normally. He has good perfusion in the bilateral upper and lower extremities. He appears comfortable. Able to lift bilateral upper extremities albeit weakly.  POD #5s/p4 level ACDF for prominent myelopathy symptomatology in the presence of severe multilevel spinal cord compression   - up with PT/OT, encourage ambulation - okay to get to chair - plan is to discharge to inpatient rehab to work on his independence and ambulation secondary to prominent myelopathic deficits. - Percocet for pain, Valium for muscle spasms - scripts are printed and signed in the patient's chart for home - patient can utilize shower collar after 5 days and remove the outer bulky dressing and shower normally over Steri-Strips at 5 days postop - d/cto inpatient rehab when available -f/u in 2 weeksat the office   Brandon HartChristopher R  Collins 08/17/2018, 8:50 AM

## 2018-08-17 NOTE — Progress Notes (Signed)
Received call back from on call MD C. Susa SimmondsAdair and got verbal order for accuchecks [facility].  Will continue to monitor.

## 2018-08-17 NOTE — Progress Notes (Signed)
Hypoglycemic Event  CBG: 33 mg/dl  Treatment: provided patient with 15 gram CHO  Symptoms: sweaty and shaky  Follow-up CBG: Time: 1822 CBG Result: 70 mg/dl  Possible Reasons for Event: inadequate meal intake  Comments/MD notified: [facility] calling [facility] requesting an order for scheduled accuchecks.  Provided patient with another 15 gram CHO and held ordered metformin   Mathis DadAngela  Mitchel Delduca

## 2018-08-18 DIAGNOSIS — G9520 Unspecified cord compression: Secondary | ICD-10-CM

## 2018-08-18 DIAGNOSIS — I10 Essential (primary) hypertension: Secondary | ICD-10-CM

## 2018-08-18 DIAGNOSIS — E785 Hyperlipidemia, unspecified: Secondary | ICD-10-CM

## 2018-08-18 DIAGNOSIS — E119 Type 2 diabetes mellitus without complications: Secondary | ICD-10-CM

## 2018-08-18 DIAGNOSIS — K5903 Drug induced constipation: Secondary | ICD-10-CM

## 2018-08-18 DIAGNOSIS — G8918 Other acute postprocedural pain: Secondary | ICD-10-CM

## 2018-08-18 LAB — GLUCOSE, CAPILLARY
Glucose-Capillary: 78 mg/dL (ref 70–99)
Glucose-Capillary: 213 mg/dL — ABNORMAL HIGH (ref 70–99)
Glucose-Capillary: 184 mg/dL — ABNORMAL HIGH (ref 70–99)
Glucose-Capillary: 168 mg/dL — ABNORMAL HIGH (ref 70–99)
Glucose-Capillary: 162 mg/dL — ABNORMAL HIGH (ref 70–99)

## 2018-08-18 NOTE — Anesthesia Postprocedure Evaluation (Signed)
Anesthesia Post Note  Patient: Brandon Collins  Procedure(s) Performed: ANTERIOR CERVICAL DECOMPRESSION FUSION CERVICAL 3-4, CERVICAL 4-5, CERVICAL 5-6, CERVICAL 6-7 WITH INSTRUMENTATION AND ALLOGRAFT (N/A Neck)     Patient location during evaluation: PACU Anesthesia Type: General Level of consciousness: awake and alert Pain management: pain level controlled Vital Signs Assessment: post-procedure vital signs reviewed and stable Respiratory status: spontaneous breathing, nonlabored ventilation, respiratory function stable and patient connected to nasal cannula oxygen Cardiovascular status: blood pressure returned to baseline and stable Postop Assessment: no apparent nausea or vomiting Anesthetic complications: no    Last Vitals:  Vitals:   08/17/18 2110 08/18/18 0530  BP: (!) 147/72 (!) 161/76  Pulse: 84 (!) 101  Resp: 18 17  Temp: 37.1 C 37.1 C  SpO2: 96% 95%    Last Pain:  Vitals:   08/18/18 0530  TempSrc: Oral  PainSc:                  [name]

## 2018-08-18 NOTE — PMR Pre-admission (Addendum)
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  He was working fulltime till two weeks ago when he started having significant decline. He was admitted via ED on 08/12/2018 with new onset BUE weakness, LLE weakness, balance deficits with multiple falls as well as difficulty with basic ADLs.  He was taken to the OR for ACDF C3-C7 by Dr. Lynann Bologna.  Therapy evaluations to be done today and CIR recommended due to recent functional decline.    Past Medical History  Past Medical History:   Diagnosis Date  . Arthritis    "all over" (08/12/2018)  . High cholesterol   . Hypertension   . Type II diabetes mellitus (HCC)     Family History  family history includes Cancer in his mother; Stroke in his father.  Prior Rehab/Hospitalizations: No recent rehab or hospitalizations.  Has the patient had major surgery during 100 days prior to admission? No  Current Medications   Current Facility-Administered Medications:  .  0.9 %  sodium chloride infusion, 250 mL, Intravenous, Continuous, McKenzie, Lennie Muckle, [name], Last Rate: 1 mL/hr at 08/13/18 0037 .  acetaminophen (TYLENOL) tablet 650 mg, 650 mg, Oral, Q4H PRN, 650 mg at 08/17/18 0734 **OR** acetaminophen (TYLENOL) suppository 650 mg, 650 mg, Rectal, Q4H PRN, McKenzie, Kayla J, [name] .  alum & mag hydroxide-simeth (MAALOX/MYLANTA) 200-200-20 MG/5ML suspension 30 mL, 30 mL, Oral, Q6H PRN, McKenzie, Kayla J, [name], 30 mL at 08/15/18 1440 .  amLODipine (NORVASC) tablet 10 mg, 10 mg, Oral, Daily, McKenzie, Kayla J, [name], 10 mg at 08/18/18 1002 .  atorvastatin (LIPITOR) tablet 20 mg, 20 mg, Oral, Once per day on Sun Tue Thu Sat, McKenzie,  Kayla J, [name], 20 mg at 08/17/18 1011 .  bisacodyl (DULCOLAX) EC tablet 5 mg, 5 mg, Oral, Daily PRN, McKenzie, Kayla J, [name] .  cholecalciferol (VITAMIN D3) tablet 1,000 Units, 1,000 Units, Oral, Daily, McKenzie, Kayla J, [name], 1,000 Units at 08/18/18 1002 .  cloNIDine (CATAPRES) tablet 0.1 mg, 0.1 mg, Oral, BID, McKenzie, Kayla J, [name], 0.1 mg at 08/18/18 1002 .  diazepam (VALIUM) tablet 5 mg, 5 mg, Oral, Q6H PRN, McKenzie, Kayla J, [name], 5 mg at 08/15/18 2216 .  docusate sodium (COLACE) capsule 100 mg, 100 mg, Oral, BID, McKenzie, Kayla J, [name], 100 mg at 08/18/18 1002 .  glimepiride (AMARYL) tablet 8 mg, 8 mg, Oral, Daily, McKenzie, Kayla J, [name], 8 mg at 08/18/18 1002 .  lisinopril (PRINIVIL,ZESTRIL) tablet 40 mg, 40 mg, Oral, Daily, McKenzie, Kayla J, [name], 40 mg at 08/18/18 1002 .   menthol-cetylpyridinium (CEPACOL) lozenge 3 mg, 1 lozenge, Oral, PRN **OR** phenol (CHLORASEPTIC) mouth spray 1 spray, 1 spray, Mouth/Throat, PRN, McKenzie, Kayla J, [name], 1 spray at 08/13/18 0856 .  metFORMIN (GLUCOPHAGE-XR) 24 hr tablet 1,500 mg, 1,500 mg, Oral, QPM, McKenzie, Kayla J, [name], 1,500 mg at 08/16/18 1822 .  ondansetron (ZOFRAN) tablet 4 mg, 4 mg, Oral, Q6H PRN **OR** ondansetron (ZOFRAN) injection 4 mg, 4 mg, Intravenous, Q6H PRN, McKenzie, Kayla J, [name] .  oxyCODONE-acetaminophen (PERCOCET/ROXICET) 5-325 MG per tablet 1-2 tablet, 1-2 tablet, Oral, Q4H PRN, McKenzie, Kayla J, [name], 2 tablet at 08/17/18 1836 .  pantoprazole (PROTONIX) EC tablet 40 mg, 40 mg, Oral, Q supper, Alvira Philips, [place], 40 mg at 08/17/18 1836 .  pioglitazone (ACTOS) tablet 15 mg, 15 mg, Oral, Daily, McKenzie, Kayla J, [name], 15 mg at 08/18/18 1003 .  senna-docusate (Senokot-S) tablet 1 tablet, 1 tablet, Oral, QHS PRN, McKenzie, Kayla J, [name] .  sodium chloride flush (NS) 0.9 % injection 3 mL, 3 mL, Intravenous, Q12H, McKenzie, Kayla J, [name], 3 mL at 08/17/18 2212 .  sodium chloride flush (NS) 0.9 % injection 3 mL, 3 mL, Intravenous, PRN, McKenzie, Kayla J, [name] .  sodium phosphate (FLEET) 7-19 GM/118ML enema 1 enema, 1 enema, Rectal, Once PRN, McKenzie, Lennie Muckle, [name]  Patients Current Diet:  Diet Order            Diet regular Room service appropriate? Yes; Fluid consistency: Thin  Diet effective now              Precautions / Restrictions Precautions Precautions: Cervical, Fall Precaution Booklet Issued: No Precaution Comments: Reviewed cervical precautions Cervical Brace: Hard collar, At all times Restrictions Weight Bearing Restrictions: No   Has the patient had 2 or more falls or a fall with injury in the past year?Yes.  Patient and wife report more than 5 falls with recent injury to right knee.  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  Has  now retired since surgery.  Home Assistive Devices / Equipment Home Assistive Devices/Equipment: Environmental consultant (specify type), Eyeglasses Home Equipment: Walker - 4 wheels  Prior Device Use: Indicate devices/aids used by the patient prior to current illness, exacerbation or injury? None  Prior Functional Level Prior Function Level of Independence: Independent Comments: works as a Programmer, systems Care: Did the patient need help bathing, dressing, using the toilet or eating?  Independent  Indoor Mobility: Did the patient need assistance with walking from room to room (with or without device)? Independent  Stairs: Did the patient need assistance with internal  or external stairs (with or without device)? Independent  Functional Cognition: Did the patient need help planning regular tasks such as shopping or remembering to take medications? Independent  Current Functional Level Cognition  Overall Cognitive Status: Within Functional Limits for tasks assessed Orientation Level: Oriented X4 General Comments: Pt able to follow simple cues and requires increased time for bed mobility and exercises.     Extremity Assessment (includes Sensation/Coordination)  Upper Extremity Assessment: Generalized weakness  Lower Extremity Assessment: Defer to PT evaluation    ADLs  Overall ADL's : Needs assistance/impaired Eating/Feeding: Sitting, Minimal assistance, Moderate assistance Eating/Feeding Details (indicate cue type and reason): reports difficulty holding onto fork Grooming: Moderate assistance Upper Body Bathing: Moderate assistance, Sitting Lower Body Bathing: +2 for physical assistance, Maximal assistance, Sit to/from stand Lower Body Bathing Details (indicate cue type and reason): partial stand Upper Body Dressing : Maximal assistance, Sitting Lower Body Dressing: Maximal assistance, +2 for physical assistance, Sit to/from stand Lower Body Dressing Details (indicate cue type and  reason): partial stand Toilet Transfer: Total assistance(Maxi move for transfer to recliner) Toilet Transfer Details (indicate cue type and reason): Simulated to recliner. Pt attempting sit<>stand with Max A +2-3 but unable to achieve standing posture. Pt requiring Maxi move for total lift to recliner. Pt moitvated to transfer to recliner and reports he likes sitting upright Functional mobility during ADLs: Total assistance(Maxi move) General ADL Comments: Pt completed partial sit<>stand 3x throughout session. Sat EOB about 8 minutes, min guard level. Utilizing [employer] for transfer to recliner. Pt participating in [place] exercises at EOB    Mobility  Overal bed mobility: Needs Assistance Bed Mobility: Supine to Sit, Sit to Sidelying, Rolling Rolling: Min assist Supine to sit: HOB elevated, Max assist, +2 for safety/equipment Sit to sidelying: Mod assist, +2 for physical assistance General bed mobility comments: Pt sitting with HOB elevated in upright position and BLEs extented to long sitting position. Pt declining to lay back in a supine position for log roll. Use of bed pad to bring hips to EOB and Max A    Transfers  Overall transfer level: Needs assistance Equipment used: (+2 face to face with use of gait belt and bed pad) Transfer via Lift Equipment: Letta Median Transfers: Sit to/from Stand Sit to Stand: Max assist, +2 physical assistance, From elevated surface General transfer comment: Sit<>Stand x3 with Max A +2 and stedy. Unable to achieve upright posture. Use of Mavimove to transfer to recliner    Ambulation / Gait / Stairs / Emergency planning/management officer  Ambulation/Gait General Gait Details: unable     Posture / Balance Balance Overall balance assessment: Needs assistance Sitting-balance support: Bilateral upper extremity supported, Feet supported Sitting balance-Leahy Scale: Fair Standing balance-Leahy Scale: Zero Standing balance comment: unable to come to full standing position  this session    Special needs/care consideration BiPAP/CPAP No CPM No Continuous Drip IV No Dialysis No     Life Vest No Oxygen No Special Bed No Trach Size No Wound Vac (area) No     Skin Has dressing to anterior neck and C-collar in place.  Has many skin abrasions.                       Bowel mgmt: Last BM 08/16/18 Bladder mgmt: Condom external catheter in place Diabetic mgmt Yes, on oral medications at home.    Previous Home Environment Living Arrangements: Spouse/significant other Available Help at Discharge: Family Type of Home: House Home Layout: One level  Home Access: Stairs to enter Entrance Stairs-Rails: Right, Left Entrance Stairs-Number of Steps: 3 Bathroom Shower/Tub: Chiropodist: Standard Home Care Services: No  Discharge Living Setting Plans for Discharge Living Setting: Patient's home, House, Lives with (comment)(Lives with wife.) Type of Home at Discharge: House Discharge Home Layout: One level Discharge Home Access: Stairs to enter Technical brewer of Steps: 3 Discharge Bathroom Shower/Tub: Tub/shower unit, Door Discharge Bathroom Toilet: Handicapped height Discharge Bathroom Accessibility: Yes How Accessible: Accessible via walker Does the patient have any problems obtaining your medications?: No  Social/Family/Support Systems Patient Roles: Spouse, Parent(Has a wife, 2 daughters, 1 son.) Contact Information: Juleon Narang - spouse Anticipated Caregiver: wife and daughter and son Anticipated Caregiver's Contact Information: Butch Penny - wife [phone] Ability/Limitations of Caregiver: Wife works 3 minutes from home, can provide supervision intermittently.  A son and daughter work but can assist some. Caregiver Availability: Intermittent Discharge Plan Discussed with Primary Caregiver: Yes Is Caregiver In Agreement with Plan?: Yes Does Caregiver/Family have Issues with Lodging/Transportation while Pt is in Rehab?:  No  Goals/Additional Needs Patient/Family Goal for Rehab: PT/OT mod I and supervision goals Expected length of stay: 14-18 days Cultural Considerations: [employer] Dietary Needs: Regular diet, thin liquids Equipment Needs: TBD Pt/Family Agrees to Admission and willing to participate: Yes Program Orientation Provided & Reviewed with Pt/Caregiver Including Roles  & Responsibilities: Yes  Decrease burden of Care through IP rehab admission: N/A  Possible need for SNF placement upon discharge: Potentially  Patient Condition: This patient's medical and functional status has changed since the consult dated: 08/13/18 in which the Rehabilitation Physician determined and documented that the patient's condition is appropriate for intensive rehabilitative care in an inpatient rehabilitation facility. See "History of Present Illness" (above) for medical update. Functional changes are:  Currently requiring max assist +2 for sit to stand and max assist +2 and steady for transfers. Patient's medical and functional status update has been discussed with the Rehabilitation physician and patient remains appropriate for inpatient rehabilitation. Will admit to inpatient rehab today.  Preadmission Screen Completed By:  Retta Diones, 08/18/2018 3:55 PM ______________________________________________________________________   Discussed status with Dr. Posey Pronto on 08/18/18 at 1355 and received telephone approval for admission today.  Admission Coordinator:  Retta Diones, time 1554/Date 08/18/18

## 2018-08-18 NOTE — Progress Notes (Signed)
    Patient doing well  Denies arm pain Tolerating PO well, but having intermittent issues with thin liquids   Physical Exam: Vitals:   08/17/18 2110 08/18/18 0530  BP: (!) 147/72 (!) 161/76  Pulse: 84 (!) 101  Resp: 18 17  Temp: 98.8 F (37.1 C) 98.8 F (37.1 C)  SpO2: 96% 95%    Neck soft/supple Collar well-applied NVI  POD s/p C3-C7 ACDF, doing well  - encourage ambulation - Percocet for pain, Valium for muscle spasms - may transfer to inpatient rehab once a bed becomes available - remove foley and dressing today

## 2018-08-18 NOTE — Progress Notes (Signed)
Physical Therapy Treatment Patient Details Name: Brandon OchsCarl S Collins MRN: [id number] DOB: 02/04/1951 Today's Date: 08/18/2018    History of Present Illness 67 yo male s/p 4 level ACDF. PMH includes: HTN, diabetes and chronic cervical pain with some UE weakness     PT Comments    Patient seen for mobility progression.  This session focused on bed mobitiy and functional transfers. Stedy standing frame utilized for sit to stands X3 trials with max A +2 but pt unable to progress to upright posture. Hoyer lift use for OOB transfer to recliner. Pt continues to c/o pain in R LE and specifically inferior to popliteal fossa. Continue to progress as tolerated.    Follow Up Recommendations  CIR     Equipment Recommendations  Other (comment)(TBD next venue)    Recommendations for Other Services       Precautions / Restrictions Precautions Precautions: Cervical;Fall Precaution Booklet Issued: No Precaution Comments: Reviewed cervical precautions Required Braces or Orthoses: Cervical Brace Cervical Brace: Hard collar;At all times Restrictions Weight Bearing Restrictions: No    Mobility  Bed Mobility Overal bed mobility: Needs Assistance Bed Mobility: Supine to Sit;Sit to Sidelying;Rolling     Supine to sit: HOB elevated;Max assist;+2 for safety/equipment     General bed mobility comments: Pt sitting with HOB elevated in upright position and BLEs extented to long sitting position. Pt declining to lay back in a supine position for log roll. Use of bed pad to bring hips to EOB and Max A  Transfers Overall transfer level: Needs assistance Equipment used: (+2 face to face with use of gait belt and bed pad) Transfers: Sit to/from Stand Sit to Stand: Max assist;+2 physical assistance;From elevated surface         General transfer comment: Sit<>Stand x3 with Max A +2 and stedy. Unable to achieve upright posture. Use of Mavimove to transfer to recliner  Ambulation/Gait                  Stairs             Wheelchair Mobility    Modified Rankin (Stroke Patients Only)       Balance Overall balance assessment: Needs assistance Sitting-balance support: Bilateral upper extremity supported;Feet supported Sitting balance-Leahy Scale: Fair       Standing balance-Leahy Scale: Zero Standing balance comment: unable to come to full standing position this session                            Cognition Arousal/Alertness: Awake/alert Behavior During Therapy: [facility] for tasks assessed/performed;Anxious Overall Cognitive Status: Within Functional Limits for tasks assessed                                 General Comments: Pt able to follow simple cues and requires increased time for bed mobility and exercises.       Exercises General Exercises - Lower Extremity Heel Slides: AAROM;Both;Supine Heel Slides Limitations: limited R LE ROM due to pain    General Comments        Pertinent Vitals/Pain Faces Pain Scale: Hurts whole lot Pain Location: R LE (knee) Pain Descriptors / Indicators: Aching;Constant;Grimacing;Guarding;Moaning Pain Intervention(s): Limited activity within patient's tolerance;Monitored during session;Repositioned;Relaxation    Home Living                      Prior Function  PT Goals (current goals can now be found in the care plan section) Acute Rehab PT Goals Patient Stated Goal: rehab then home. regain independence Progress towards PT goals: Progressing toward goals    Frequency    Min 3X/week      PT Plan Current plan remains appropriate    Co-evaluation PT/OT/SLP Co-Evaluation/Treatment: Yes Reason for Co-Treatment: Complexity of the patient's impairments (multi-system involvement);For patient/therapist safety;To address functional/ADL transfers PT goals addressed during session: Mobility/safety with mobility;Strengthening/ROM        AM-PAC PT "6 Clicks" Mobility   Outcome  Measure  Help needed turning from your back to your side while in a flat bed without using bedrails?: A Lot Help needed moving from lying on your back to sitting on the side of a flat bed without using bedrails?: A Lot Help needed moving to and from a bed to a chair (including a wheelchair)?: Total Help needed standing up from a chair using your arms (e.g., wheelchair or bedside chair)?: A Lot Help needed to walk in hospital room?: Total Help needed climbing 3-5 steps with a railing? : Total 6 Click Score: 9    End of Session Equipment Utilized During Treatment: Gait belt;Cervical collar Activity Tolerance: Patient limited by pain;Patient limited by fatigue Patient left: in bed;with call bell/phone within reach Nurse Communication: Mobility status;Need for lift equipment PT Visit Diagnosis: Unsteadiness on feet (R26.81);Muscle weakness (generalized) (M62.81);Difficulty in walking, not elsewhere classified (R26.2)     Time: 8295-6213 PT Time Calculation (min) (ACUTE ONLY): 30 min  Charges:  $Therapeutic Activity: 8-22 mins                     Erline Levine, PTA Acute Rehabilitation Services Pager: [phone] Office: [phone]     Carolynne Edouard 08/18/2018, 1:04 PM

## 2018-08-18 NOTE — Progress Notes (Signed)
Occupational Therapy Treatment Patient Details Name: Brandon Collins MRN: [id number] DOB: 1950-12-23 Today's Date: 08/18/2018    History of present illness 67 yo male s/p 4 level ACDF. PMH includes: HTN, diabetes and chronic cervical pain with some UE weakness    OT comments  Upon arrival, pt was awake and long sitting in bed with HOB elevated for support. Pt requiring Max A for bed mobility. Pt performing sit<>stand x3 with Max A +2 and stedy. Pt participating in BUE exercises at EOB with Min Guard for safety. Utilizing [facility] and +3 for safe transfer to recliner. Pt continue to be very motivated to participate in therapy. Continue to recommend dc to CIR and will continue to follow acutely as admitted.    Follow Up Recommendations  CIR    Equipment Recommendations  Other (comment)(defer to next venue. )    Recommendations for Other Services Rehab consult    Precautions / Restrictions Precautions Precautions: Cervical;Fall Precaution Booklet Issued: No Precaution Comments: Reviewed cervical precautions Required Braces or Orthoses: Cervical Brace Cervical Brace: Hard collar;At all times Restrictions Weight Bearing Restrictions: No       Mobility Bed Mobility Overal bed mobility: Needs Assistance Bed Mobility: Supine to Sit;Sit to Sidelying;Rolling     Supine to sit: HOB elevated;Max assist;+2 for safety/equipment     General bed mobility comments: Pt sitting with HOB elevated in upright position and BLEs extented to long sitting position. Pt declining to lay back in a supine position for log roll. Use of bed pad to bring hips to EOB and Max A  Transfers Overall transfer level: Needs assistance Equipment used: (+2 face to face with use of gait belt and bed pad) Transfers: Sit to/from Stand Sit to Stand: Max assist;+2 physical assistance;From elevated surface         General transfer comment: Sit<>Stand x3 with Max A +2 and stedy. Unable to achieve upright posture. Use  of Mavimove to transfer to recliner    Balance Overall balance assessment: Needs assistance Sitting-balance support: Bilateral upper extremity supported;Feet supported Sitting balance-Leahy Scale: Fair       Standing balance-Leahy Scale: Zero Standing balance comment: unable to come to full standing position this session                           ADL either performed or assessed with clinical judgement   ADL Overall ADL's : Needs assistance/impaired                         Toilet Transfer: Total assistance(Maxi move for transfer to recliner) Toilet Transfer Details (indicate cue type and reason): Simulated to recliner. Pt attempting sit<>stand with Max A +2-3 but unable to achieve standing posture. Pt requiring Maxi move for total lift to recliner. Pt moitvated to transfer to recliner and reports he likes sitting upright         Functional mobility during ADLs: Total assistance(Maxi move) General ADL Comments: Pt completed partial sit<>stand 3x throughout session. Sat EOB about 8 minutes, min guard level. Utilizing [employer] for transfer to recliner. Pt participating in BUEs exercises at [facility]     Vision       Perception     Praxis      Cognition Arousal/Alertness: Awake/alert Behavior During Therapy: [facility] for tasks assessed/performed;Anxious Overall Cognitive Status: Within Functional Limits for tasks assessed  General Comments: Pt able to follow simple cues and requires increased time for bed mobility and exercises.         Exercises Exercises: General Upper Extremity General Exercises - Upper Extremity Shoulder ABduction: AROM;Both;10 reps;Seated(within precautions) Shoulder ADduction: AROM;Both;10 reps;Seated Elbow Flexion: AROM;Both;10 reps;Seated Elbow Extension: AROM;Both;10 reps;Seated   Shoulder Instructions       General Comments      Pertinent Vitals/ Pain       Pain Assessment:  Faces Faces Pain Scale: Hurts whole lot Pain Location: R LE (knee) Pain Descriptors / Indicators: Aching;Constant;Grimacing;Guarding;Moaning("it's like a toothache") Pain Intervention(s): Monitored during session;Limited activity within patient's tolerance;Repositioned;Relaxation  Home Living                                          Prior Functioning/Environment              Frequency  Min 3X/week        Progress Toward Goals  OT Goals(current goals can now be found in the care plan section)     Acute Rehab OT Goals Patient Stated Goal: rehab then home. regain independence OT Goal Formulation: With patient/family Time For Goal Achievement: 08/27/18 Potential to Achieve Goals: Good ADL Goals Pt Will Perform Eating: with modified independence;with adaptive utensils;sitting Pt Will Perform Grooming: with set-up;sitting Pt Will Perform Upper Body Bathing: with modified independence;sitting Pt Will Perform Lower Body Bathing: sit to/from stand;with max assist Pt Will Perform Upper Body Dressing: with modified independence;sitting Pt Will Perform Lower Body Dressing: with max assist;sit to/from stand Pt Will Transfer to Toilet: stand pivot transfer;with mod assist;bedside commode Pt Will Perform Toileting - Clothing Manipulation and hygiene: with max assist;sit to/from stand  Plan Discharge plan remains appropriate    Co-evaluation    PT/OT/SLP Co-Evaluation/Treatment: Yes Reason for Co-Treatment: Complexity of the patient's impairments (multi-system involvement);For patient/therapist safety;To address functional/ADL transfers   OT goals addressed during session: ADL's and self-care      AM-PAC OT "6 Clicks" Daily Activity     Outcome Measure   Help from another person eating meals?: A Little Help from another person taking care of personal grooming?: A Lot Help from another person toileting, which includes using toliet, bedpan, or urinal?:  Total Help from another person bathing (including washing, rinsing, drying)?: A Lot Help from another person to put on and taking off regular upper body clothing?: A Lot Help from another person to put on and taking off regular lower body clothing?: Total 6 Click Score: 11    End of Session Equipment Utilized During Treatment: Gait belt;Rolling walker;Other (comment);Cervical collar(stedy; maxi move)  OT Visit Diagnosis: Other abnormalities of gait and mobility (R26.89);Muscle weakness (generalized) (M62.81);Other symptoms and signs involving the nervous system (R29.898);Pain   Activity Tolerance Patient tolerated treatment well   Patient Left in chair;with call bell/phone within reach;with chair alarm set   Nurse Communication Mobility status        Time: [id number] OT Time Calculation (min): 33 min  Charges: OT General Charges $OT Visit: 1 Visit OT Treatments $Self Care/Home Management : 8-22 mins  Gaylene Moylan MSOT, OTR/L Acute Rehab Pager: [phone] Office: [phone]   Theodoro GristCharis M Olivene Cookston 08/18/2018, 10:48 AM

## 2018-08-18 NOTE — Plan of Care (Signed)
  Problem: Education: Goal: Knowledge of General Education information will improve Description: Including pain rating scale, medication(s)/side effects and non-pharmacologic comfort measures Outcome: Progressing   Problem: Activity: Goal: Risk for activity intolerance will decrease Outcome: Progressing   Problem: Nutrition: Goal: Adequate nutrition will be maintained Outcome: Progressing   

## 2018-08-18 NOTE — Progress Notes (Signed)
IP rehab admissions - I have approval from [facility] for acute inpatient rehab admission for today.  I met with patient, his wife and his daughter at the bedside.  All are agreeable to inpatient rehab admit.  I have placed a call to attending MD.  Awaiting call back.  I do have a bed available today and can admit today if cleared by attending MD.  Call me for questions.  [phone]

## 2018-08-19 ENCOUNTER — Inpatient Hospital Stay (HOSPITAL_COMMUNITY): Payer: Managed Care, Other (non HMO)

## 2018-08-19 ENCOUNTER — Other Ambulatory Visit: Payer: Self-pay

## 2018-08-19 ENCOUNTER — Inpatient Hospital Stay (HOSPITAL_COMMUNITY)
Admission: RE | Admit: 2018-08-19 | Discharge: 2018-09-18 | DRG: 559 | Disposition: A | Discharge status: Skilled Nursing Facility | Payer: Managed Care, Other (non HMO) | Source: Intra-hospital | Attending: Physical Medicine & Rehabilitation | Admitting: Physical Medicine & Rehabilitation

## 2018-08-19 ENCOUNTER — Encounter (HOSPITAL_COMMUNITY): Payer: Self-pay

## 2018-08-19 DIAGNOSIS — Z781 Physical restraint status: Secondary | ICD-10-CM

## 2018-08-19 DIAGNOSIS — N289 Disorder of kidney and ureter, unspecified: Secondary | ICD-10-CM | POA: Diagnosis present

## 2018-08-19 DIAGNOSIS — K567 Ileus, unspecified: Secondary | ICD-10-CM

## 2018-08-19 DIAGNOSIS — G959 Disease of spinal cord, unspecified: Secondary | ICD-10-CM

## 2018-08-19 DIAGNOSIS — E785 Hyperlipidemia, unspecified: Secondary | ICD-10-CM | POA: Diagnosis present

## 2018-08-19 DIAGNOSIS — Z0189 Encounter for other specified special examinations: Secondary | ICD-10-CM

## 2018-08-19 DIAGNOSIS — E871 Hypo-osmolality and hyponatremia: Secondary | ICD-10-CM | POA: Diagnosis present

## 2018-08-19 DIAGNOSIS — Z4659 Encounter for fitting and adjustment of other gastrointestinal appliance and device: Secondary | ICD-10-CM

## 2018-08-19 DIAGNOSIS — Z87891 Personal history of nicotine dependence: Secondary | ICD-10-CM

## 2018-08-19 DIAGNOSIS — Z7984 Long term (current) use of oral hypoglycemic drugs: Secondary | ICD-10-CM | POA: Diagnosis not present

## 2018-08-19 DIAGNOSIS — R52 Pain, unspecified: Secondary | ICD-10-CM

## 2018-08-19 DIAGNOSIS — I10 Essential (primary) hypertension: Secondary | ICD-10-CM | POA: Diagnosis present

## 2018-08-19 DIAGNOSIS — G542 Cervical root disorders, not elsewhere classified: Secondary | ICD-10-CM | POA: Diagnosis present

## 2018-08-19 DIAGNOSIS — J9811 Atelectasis: Secondary | ICD-10-CM | POA: Diagnosis present

## 2018-08-19 DIAGNOSIS — M25571 Pain in right ankle and joints of right foot: Secondary | ICD-10-CM | POA: Diagnosis present

## 2018-08-19 DIAGNOSIS — Z823 Family history of stroke: Secondary | ICD-10-CM

## 2018-08-19 DIAGNOSIS — R600 Localized edema: Secondary | ICD-10-CM

## 2018-08-19 DIAGNOSIS — K5900 Constipation, unspecified: Secondary | ICD-10-CM | POA: Diagnosis present

## 2018-08-19 DIAGNOSIS — Z4789 Encounter for other orthopedic aftercare: Principal | ICD-10-CM

## 2018-08-19 DIAGNOSIS — E11649 Type 2 diabetes mellitus with hypoglycemia without coma: Secondary | ICD-10-CM | POA: Diagnosis not present

## 2018-08-19 DIAGNOSIS — D72829 Elevated white blood cell count, unspecified: Secondary | ICD-10-CM | POA: Diagnosis present

## 2018-08-19 DIAGNOSIS — Z23 Encounter for immunization: Secondary | ICD-10-CM | POA: Diagnosis not present

## 2018-08-19 DIAGNOSIS — Z981 Arthrodesis status: Secondary | ICD-10-CM

## 2018-08-19 DIAGNOSIS — G8250 Quadriplegia, unspecified: Secondary | ICD-10-CM | POA: Diagnosis present

## 2018-08-19 DIAGNOSIS — E1165 Type 2 diabetes mellitus with hyperglycemia: Secondary | ICD-10-CM | POA: Diagnosis present

## 2018-08-19 DIAGNOSIS — T17908A Unspecified foreign body in respiratory tract, part unspecified causing other injury, initial encounter: Secondary | ICD-10-CM

## 2018-08-19 LAB — GLUCOSE, CAPILLARY
Glucose-Capillary: 176 mg/dL — ABNORMAL HIGH (ref 70–99)
Glucose-Capillary: 147 mg/dL — ABNORMAL HIGH (ref 70–99)
Glucose-Capillary: 123 mg/dL — ABNORMAL HIGH (ref 70–99)
Glucose-Capillary: 109 mg/dL — ABNORMAL HIGH (ref 70–99)

## 2018-08-19 MED ORDER — ALUM & MAG HYDROXIDE-SIMETH 200-200-20 MG/5ML PO SUSP: 30.0000 mL | Freq: Four times a day (QID) | ORAL | Status: DC | PRN

## 2018-08-19 MED ORDER — ATORVASTATIN CALCIUM 10 MG PO TABS
20.0000 mg | ORAL_TABLET | ORAL | Status: DC
  Administered 2018-08-23 – 2018-09-18 (×16): 20 mg via ORAL
  Filled 2018-08-19 (×15): qty 2

## 2018-08-19 MED ORDER — ONDANSETRON HCL 4 MG/2ML IJ SOLN: 4.0000 mg | Freq: Four times a day (QID) | INTRAMUSCULAR | Status: DC | PRN

## 2018-08-19 MED ORDER — GLIMEPIRIDE 4 MG PO TABS
8.0000 mg | ORAL_TABLET | Freq: Every day | ORAL | Status: DC
Start: 2018-08-20 — End: 2018-08-23
  Administered 2018-08-20 – 2018-08-22 (×3): 8 mg via ORAL
  Filled 2018-08-19 (×3): qty 2

## 2018-08-19 MED ORDER — AMLODIPINE BESYLATE 10 MG PO TABS
10.0000 mg | ORAL_TABLET | Freq: Every day | ORAL | Status: DC
  Administered 2018-08-20 – 2018-09-18 (×30): 10 mg via ORAL
  Filled 2018-08-19 (×31): qty 1

## 2018-08-19 MED ORDER — SENNOSIDES-DOCUSATE SODIUM 8.6-50 MG PO TABS
1.0000 | ORAL_TABLET | Freq: Every evening | ORAL | Status: DC | PRN
  Administered 2018-08-20: 1 via ORAL
  Filled 2018-08-19: qty 1

## 2018-08-19 MED ORDER — SORBITOL 70 % SOLN
30.0000 mL | Freq: Every day | Status: DC | PRN
  Administered 2018-08-20 – 2018-09-10 (×2): 30 mL via ORAL
  Filled 2018-08-19 (×3): qty 30

## 2018-08-19 MED ORDER — BISACODYL 5 MG PO TBEC: 5.0000 mg | DELAYED_RELEASE_TABLET | Freq: Every day | ORAL | Status: DC | PRN

## 2018-08-19 MED ORDER — OXYCODONE-ACETAMINOPHEN 5-325 MG PO TABS
1.0000 | ORAL_TABLET | ORAL | Status: DC | PRN
  Administered 2018-08-19 – 2018-08-22 (×6): 2 via ORAL
  Administered 2018-08-23: 1 via ORAL
  Administered 2018-08-23: 2 via ORAL
  Filled 2018-08-19: qty 2
  Filled 2018-08-19: qty 1
  Filled 2018-08-19 (×6): qty 2

## 2018-08-19 MED ORDER — PIOGLITAZONE HCL 15 MG PO TABS
15.0000 mg | ORAL_TABLET | Freq: Every day | ORAL | Status: DC
  Administered 2018-08-20 – 2018-09-18 (×29): 15 mg via ORAL
  Filled 2018-08-19 (×32): qty 1

## 2018-08-19 MED ORDER — PANTOPRAZOLE SODIUM 40 MG PO TBEC
40.0000 mg | DELAYED_RELEASE_TABLET | Freq: Every day | ORAL | Status: DC
  Administered 2018-08-19 – 2018-09-17 (×29): 40 mg via ORAL
  Filled 2018-08-19 (×30): qty 1

## 2018-08-19 MED ORDER — METFORMIN HCL ER 750 MG PO TB24
1500.0000 mg | ORAL_TABLET | Freq: Every evening | ORAL | Status: DC
  Administered 2018-08-19 – 2018-08-20 (×2): 1500 mg via ORAL
  Filled 2018-08-19 (×3): qty 2

## 2018-08-19 MED ORDER — ONDANSETRON HCL 4 MG PO TABS
4.0000 mg | ORAL_TABLET | Freq: Four times a day (QID) | ORAL | Status: DC | PRN
  Administered 2018-08-31: 4 mg via ORAL
  Filled 2018-08-19: qty 1

## 2018-08-19 MED ORDER — ACETAMINOPHEN 325 MG PO TABS
650.0000 mg | ORAL_TABLET | ORAL | Status: DC | PRN
  Administered 2018-08-27 – 2018-09-18 (×19): 650 mg via ORAL
  Filled 2018-08-19 (×19): qty 2

## 2018-08-19 MED ORDER — ACETAMINOPHEN 650 MG RE SUPP: 650.0000 mg | RECTAL | Status: DC | PRN

## 2018-08-19 MED ORDER — VITAMIN D 25 MCG (1000 UNIT) PO TABS
1000.0000 [IU] | ORAL_TABLET | Freq: Every day | ORAL | Status: DC
  Administered 2018-08-20 – 2018-09-18 (×30): 1000 [IU] via ORAL
  Filled 2018-08-19 (×32): qty 1

## 2018-08-19 MED ORDER — CLONIDINE HCL 0.1 MG PO TABS
0.1000 mg | ORAL_TABLET | Freq: Two times a day (BID) | ORAL | Status: DC
  Administered 2018-08-19 – 2018-09-18 (×60): 0.1 mg via ORAL
  Filled 2018-08-19 (×62): qty 1

## 2018-08-19 MED ORDER — LISINOPRIL 40 MG PO TABS
40.0000 mg | ORAL_TABLET | Freq: Every day | ORAL | Status: DC
  Administered 2018-08-20 – 2018-08-22 (×3): 40 mg via ORAL
  Filled 2018-08-19 (×3): qty 1

## 2018-08-19 MED ORDER — DIAZEPAM 5 MG PO TABS
5.0000 mg | ORAL_TABLET | Freq: Four times a day (QID) | ORAL | Status: DC | PRN
  Administered 2018-08-20 – 2018-08-26 (×6): 5 mg via ORAL
  Filled 2018-08-19 (×6): qty 1

## 2018-08-19 NOTE — Progress Notes (Signed)
IP rehab admissions - I have medical clearance and discharge orders for inpatient rehab admission today.  Bed available and will admit to CIR today.  [phone]

## 2018-08-19 NOTE — H&P (Signed)
Physical Medicine and Rehabilitation Admission H&P    Chief Complaint  Patient presents with  . Neck Pain  . 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 Per chart review and patient, patient lives with spouse. One level home 3 steps to entry. Independent working up until 2 weeks ago.presented 08/12/2018 with new onset of bilateral upper extremity weakness, left lower extremity weakness and balance deficits with noted falls as well as difficulty with basic ADLs.underwent anterior cervical decompression and fusion C3-4, 4-5, 5-6 and C6-7 as well as placement of anterior instrumentation per Dr. Yevette Edwards. Hospital course pain management. Cervical collar at all times. Therapy evaluations completed with recommendations of physical medicine rehabilitation consult. Patient was admitted for a comprehensive rehabilitation program.  Review of Systems  Constitutional: Negative for chills and fever.  HENT: Positive for hearing loss.   Eyes: Negative for blurred vision and double vision.  Respiratory: Positive for cough and sputum production.   Cardiovascular: Negative for chest pain and palpitations.  Gastrointestinal: Positive for constipation. Negative for nausea and vomiting.  Genitourinary: Positive for urgency. Negative for dysuria, flank pain and hematuria.  Musculoskeletal: Positive for myalgias and neck pain.  Neurological: Positive for focal weakness. Negative for sensory change and speech change.  All other systems reviewed and are negative.  Past Medical History:  Diagnosis Date  . Arthritis    "all over" (08/12/2018)  . High cholesterol   . Hypertension   . Type II diabetes mellitus (HCC)    Past Surgical History:  Procedure Laterality Date  . ANTERIOR CERVICAL DECOMP/DISCECTOMY FUSION  08/12/2018    CERVICAL 3-4, CERVICAL 4-5, CERVICAL 5-6, CERVICAL 6-7 WITH INSTRUMENTATION AND ALLOGRAFT  . ANTERIOR CERVICAL DECOMPRESSION/DISCECTOMY FUSION 4 LEVELS N/A 08/12/2018   Procedure: ANTERIOR CERVICAL DECOMPRESSION FUSION CERVICAL 3-4, CERVICAL 4-5, CERVICAL 5-6, CERVICAL 6-7 WITH INSTRUMENTATION AND ALLOGRAFT;  Surgeon: Estill Bamberg, MD;  Location: [facility] OR;  Service: Orthopedics;  Laterality: N/A;  ANTERIOR CERVICAL DECOMPRESSION FUSION CERVICAL 3-4, CERVICAL 4-5, CERVICAL 5-6, CERVICAL 6-7 WITH INSTRUMENTATION AND ALLOGRAFT  . BACK SURGERY    . INGUINAL HERNIA REPAIR  1958   "? side"  . KNEE ARTHROSCOPY Right   . PILONIDAL CYST EXCISION     Family History  Problem Relation Age of Onset  . Cancer Mother   . Stroke Father        multiple   Social History:  reports that he has quit smoking. His smoking use included cigarettes. He has a 30.00 pack-year smoking history. He has never used smokeless tobacco. He reports that he drank alcohol. He reports that he does not use drugs. Allergies: No Known Allergies Medications Prior to Admission  Medication Sig Dispense Refill  . acetaminophen (TYLENOL) 500 MG tablet Take 1,000 mg by mouth daily as needed for moderate pain or headache.    Marland Kitchen amLODipine (NORVASC) 10 MG tablet Take 10 mg by mouth daily.    Marland Kitchen aspirin EC 81 MG tablet Take 81 mg by mouth daily.    Marland Kitchen atorvastatin (LIPITOR) 20 MG tablet Take 20 mg by mouth 4 (four) times a week. Evern Bio, Thur, and Sat  1  . cloNIDine (CATAPRES) 0.1 MG tablet Take 0.1 mg by mouth 2 (two) times daily.    Marland Kitchen glimepiride (AMARYL) 4 MG tablet Take 8 mg by mouth daily.    Marland Kitchen  lisinopril (PRINIVIL,ZESTRIL) 40 MG tablet Take 40 mg by mouth daily.    . metFORMIN (GLUCOPHAGE-XR) 500 MG 24 hr tablet Take 1,500 mg by mouth every evening.  1  . pioglitazone (ACTOS) 15 MG tablet Take 15 mg by mouth daily.    Marland Kitchen. VITAMIN D PO Take 1 capsule by mouth daily.      Drug Regimen Review Drug regimen was reviewed and remains  appropriate with no significant issues identified  Home: Home Living Family/patient expects to be discharged to:: Private residence Living Arrangements: Spouse/significant other Available Help at Discharge: Family Type of Home: House Home Access: Stairs to enter Secretary/administratorntrance Stairs-Number of Steps: 3 Entrance Stairs-Rails: Right, Left Home Layout: One level Bathroom Shower/Tub: Engineer, manufacturing systemsTub/shower unit Bathroom Toilet: Standard Home Equipment: Environmental consultantWalker - 4 wheels   Functional History: Prior Function Level of Independence: Independent 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 Pt declining to lay back in a supine position for log roll. Use of bed pad to bring hips to EOB and Max A Transfers Overall transfer level: Needs assistance Equipment used: (+2 face to face with use of gait belt and bed pad) Transfer via Lift Equipment: Dellia CloudStedy, Maximove Transfers: Sit to/from Stand Sit to Stand: Max assist, +2 physical assistance, From elevated surface General transfer comment: Sit<>Stand x3 with Max A +2 and stedy. Unable to achieve upright posture. Use of Mavimove to transfer to recliner Ambulation/Gait General Gait Details: unable     ADL: ADL Overall ADL's : Needs assistance/impaired Eating/Feeding: Sitting, Minimal assistance, Moderate assistance Eating/Feeding Details (indicate cue type and reason): reports difficulty holding onto fork Grooming: Moderate assistance Upper Body Bathing: Moderate assistance, Sitting Lower Body Bathing: +2 for physical assistance, Maximal assistance, Sit to/from  stand Lower Body Bathing Details (indicate cue type and reason): partial stand Upper Body Dressing : Maximal assistance, Sitting Lower Body Dressing: Maximal assistance, +2 for physical assistance, Sit to/from stand Lower Body Dressing Details (indicate cue type and reason): partial stand Toilet Transfer: Total assistance(Maxi move for transfer to recliner) Toilet Transfer Details (indicate cue type and reason): Simulated to recliner. Pt attempting sit<>stand with Max A +2-3 but unable to achieve standing posture. Pt requiring Maxi move for total lift to recliner. Pt moitvated to transfer to recliner and reports he likes sitting upright Functional mobility during ADLs: Total assistance(Maxi move) General ADL Comments: Pt completed partial sit<>stand 3x throughout session. Sat EOB about 8 minutes, min guard level. Utilizing [employer] Move for transfer to recliner. Pt participating in BUEs exercises at EOB  Cognition: Cognition Overall Cognitive Status: Within Functional Limits for tasks assessed Orientation Level: Oriented X4 Cognition Arousal/Alertness: Awake/alert Behavior During Therapy: WFL for tasks assessed/performed, Anxious Overall Cognitive Status: Within Functional Limits for tasks assessed General Comments: Pt able to follow simple cues and requires increased time for bed mobility and exercises.   Physical Exam: Blood pressure (!) 152/79, pulse 87, temperature 99 F (37.2 C), temperature source Oral, resp. rate 16, height 6' (1.829 m), weight 225 lb (102.1 kg), SpO2 99 %. Physical Exam  Vitals reviewed. Constitutional: He is oriented to person, place, and time. He appears well-developed and well-nourished.  HENT:  Head: Normocephalic  and atraumatic.  Eyes: EOM are normal. Right eye exhibits no discharge. Left eye exhibits no discharge.  Neck:  Cervical collar in place  Cardiovascular: Normal rate and regular rhythm.  Respiratory: Effort normal and breath sounds normal. No  respiratory distress.  GI: Soft. Bowel sounds are normal. He exhibits no distension.  Musculoskeletal:  Lower extremity edema  Neurological: He is alert and oriented to person, place, and time.  Motor: Right upper extremity: 4+/5 proximal distal Left upper extremity: 5/5 proximal distal Bilateral lower extremities: Hip flexion, knee extension 0/5, ankle dorsiflexion 2/5 (?  Effort) Sensation intact light touch  Skin: Skin is warm and dry.  Surgical site clean and dry  Psychiatric: He has a normal mood and affect. His behavior is normal.    Results for orders placed or performed during the hospital encounter of 08/12/18 (from the past 48 hour(s))  Glucose, capillary     Status: Abnormal   Collection Time: 08/17/18  5:56 PM  Result Value Ref Range   Glucose-Capillary 33 (LL) 70 - 99 mg/dL   Comment 1 Notify RN   Glucose, capillary     Status: None   Collection Time: 08/17/18  6:22 PM  Result Value Ref Range   Glucose-Capillary 70 70 - 99 mg/dL  Glucose, capillary     Status: Abnormal   Collection Time: 08/17/18  9:11 PM  Result Value Ref Range   Glucose-Capillary 117 (H) 70 - 99 mg/dL  Glucose, capillary     Status: None   Collection Time: 08/18/18  6:48 AM  Result Value Ref Range   Glucose-Capillary 78 70 - 99 mg/dL  Glucose, capillary     Status: Abnormal   Collection Time: 08/18/18  8:21 AM  Result Value Ref Range   Glucose-Capillary 213 (H) 70 - 99 mg/dL  Glucose, capillary     Status: Abnormal   Collection Time: 08/18/18 12:54 PM  Result Value Ref Range   Glucose-Capillary 184 (H) 70 - 99 mg/dL  Glucose, capillary     Status: Abnormal   Collection Time: 08/18/18  6:33 PM  Result Value Ref Range   Glucose-Capillary 168 (H) 70 - 99 mg/dL  Glucose, capillary     Status: Abnormal   Collection Time: 08/18/18  8:59 PM  Result Value Ref Range   Glucose-Capillary 162 (H) 70 - 99 mg/dL  Glucose, capillary     Status: Abnormal   Collection Time: 08/19/18  3:08 AM  Result  Value Ref Range   Glucose-Capillary 176 (H) 70 - 99 mg/dL  Glucose, capillary     Status: Abnormal   Collection Time: 08/19/18  7:55 AM  Result Value Ref Range   Glucose-Capillary 147 (H) 70 - 99 mg/dL   No results found.     Medical Problem List and Plan: 1.  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 Cervical collar at all times 2.  DVT Prophylaxis/Anticoagulation: SCDs. Check vascular study 3. Pain Management:  Oxycodone and Valium as needed. 4. Mood:  Provide emotional support 5. Neuropsych: This patient is capable of making decisions on his own behalf. 6. Skin/Wound Care:  Routine skin checks 7. Fluids/Electrolytes/Nutrition:  Routine ins and outs with follow-up chemistries 8. Diabetes mellitus. Glucophage 1500 mg daily, Actos 15 mg daily, Amaryl 8 mg daily. Check blood sugars before meals and at bedtime 9. Hypertension. Lisinopril 40 mg daily, clonidine 0.1 mg twice a day, Norvasc 10 mg daily. Monitor with increased mobility 10. Hyperlipidemia. Lipitor 11. Constipation. Laxative assistance  Post Admission Physician Evaluation: 1. Preadmission assessment reviewed and changes made below. 2. Functional deficits secondary  to cervical myelopathy status post ACDF. 3. Patient is admitted to receive collaborative, interdisciplinary care between the physiatrist, rehab nursing staff, and therapy team. 4. Patient's level of medical complexity and substantial therapy needs in context of that medical necessity cannot be provided at a lesser intensity of care such as a SNF. 5. Patient has experienced substantial functional loss from his/her baseline which was documented above under the "Functional History" and "Functional Status" headings.  Judging by the patient's diagnosis, physical exam, and functional history, the patient has potential for functional progress which will result in measurable gains while on inpatient rehab.  These gains will be of  substantial and practical use upon discharge  in facilitating mobility and self-care at the household level. 6. Physiatrist will provide 24 hour management of medical needs as well as oversight of the therapy plan/treatment and provide guidance as appropriate regarding the interaction of the two. 7. 24 hour rehab nursing will assist with bladder management, bowel management, safety, skin/wound care, disease management, pain management and patient education  and help integrate therapy concepts, techniques,education, etc. 8. PT will assess and treat for/with: Lower extremity strength, range of motion, stamina, balance, functional mobility, safety, adaptive techniques and equipment, wound care, coping skills, pain control, education. Goals are: Min a. 9. OT will assess and treat for/with: ADL's, functional mobility, safety, upper extremity strength, adaptive techniques and equipment, wound mgt, ego support, and community reintegration.   Goals are: Min a. Therapy may not proceed with showering this patient. 10. Case Management and Social Worker will assess and treat for psychological issues and discharge planning. 11. Team conference will be held weekly to assess progress toward goals and to determine barriers to discharge. 12. Patient will receive at least 3 hours of therapy per day at least 5 days per week. 13. ELOS: 19-24 days.       14. Prognosis:  good  I have personally performed a face to face diagnostic evaluation, including, but not limited to relevant history and physical exam findings, of this patient and developed relevant assessment and plan.  Additionally, I have reviewed and concur with the physician assistant's documentation above.  The patient's status has not changed. The original post admission physician evaluation remains appropriate, and any changes from the pre-admission screening or documentation from the acute chart are noted above.    Maryla Morrow, MD, ABPMR Mcarthur Rossetti Angiulli,  [name] 08/19/2018

## 2018-08-19 NOTE — Discharge Summary (Signed)
Patient ID: LETROY VAZGUEZ MRN: [id number] DOB/AGE: Mar 09, 1951 67 y.o.  Admit date: 08/12/2018 Discharge date: 08/19/2018  Admission Diagnoses:  Active Problems:   Myelopathy (HCC)   Cervical cord compression with myelopathy (HCC)   Postoperative pain   Diabetes mellitus type 2 in nonobese [facility])   Essential hypertension   Dyslipidemia   Drug induced constipation   Discharge Diagnoses:  Same  Past Medical History:  Diagnosis Date  . Arthritis    "all over" (08/12/2018)  . High cholesterol   . Hypertension   . Type II diabetes mellitus (HCC)     Surgeries: Procedure(s): ANTERIOR CERVICAL DECOMPRESSION FUSION CERVICAL 3-4, CERVICAL 4-5, CERVICAL 5-6, CERVICAL 6-7 WITH INSTRUMENTATION AND ALLOGRAFT on 08/12/2018   Consultants: None  Discharged Condition: Improved  Hospital Course: GERRY BLANCHFIELD is an 67 y.o. male who was admitted 08/12/2018 for operative treatment of myelopthy. Patient has severe unremitting pain that affects sleep, daily activities, and work/hobbies. After pre-op clearance the patient was taken to the operating room on 08/12/2018 and underwent  Procedure(s): ANTERIOR CERVICAL DECOMPRESSION FUSION CERVICAL 3-4, CERVICAL 4-5, CERVICAL 5-6, CERVICAL 6-7 WITH INSTRUMENTATION AND ALLOGRAFT.    Patient was given perioperative antibiotics:  Anti-infectives (From admission, onward)   Start     Dose/Rate Route Frequency Ordered Stop   08/13/18 0600  ceFAZolin (ANCEF) IVPB 2g/100 mL premix     2 g 200 mL/hr over 30 Minutes Intravenous On call to O.R. 08/12/18 1414 08/13/18 0653   08/13/18 0430  ceFAZolin (ANCEF) IVPB 2g/100 mL premix     2 g 200 mL/hr over 30 Minutes Intravenous Every 8 hours 08/12/18 2316 08/13/18 1246   08/12/18 2300  ceFAZolin (ANCEF) IVPB 2g/100 mL premix  Status:  Discontinued     2 g 200 mL/hr over 30 Minutes Intravenous Every 8 hours 08/12/18 2255 08/12/18 2316       Patient was given sequential compression devices, early ambulation  to prevent DVT.  Patient benefited maximally from hospital stay and there were no complications.    Recent vital signs:  Patient Vitals for the past 24 hrs:  BP Temp Temp src Pulse Resp SpO2  08/19/18 0528 (!) 152/79 99 F (37.2 C) Oral 87 16 99 %  08/18/18 2058 (!) 177/72 100 F (37.8 C) Oral 94 16 95 %  08/18/18 1345 135/74 99.1 F (37.3 C) Oral 86 18 97 %     Discharge Medications:    Current Facility-Administered Medications:  .  0.9 %  sodium chloride infusion, 250 mL, Intravenous, Continuous, Srihari Shellhammer, Eilene Ghazi, [name], Last Rate: 1 mL/hr at 08/13/18 0037 .  acetaminophen (TYLENOL) tablet 650 mg, 650 mg, Oral, Q4H PRN, 650 mg at 08/17/18 0734 **OR** acetaminophen (TYLENOL) suppository 650 mg, 650 mg, Rectal, Q4H PRN, Gicela Schwarting J, [name] .  alum & mag hydroxide-simeth (MAALOX/MYLANTA) 200-200-20 MG/5ML suspension 30 mL, 30 mL, Oral, Q6H PRN, Otniel Hoe J, [name], 30 mL at 08/15/18 1440 .  amLODipine (NORVASC) tablet 10 mg, 10 mg, Oral, Daily, Doyle Kunath J, [name], 10 mg at 08/18/18 1002 .  atorvastatin (LIPITOR) tablet 20 mg, 20 mg, Oral, Once per day on Sun Tue Thu Sat, Grizel Vesely J, [name], 20 mg at 08/17/18 1011 .  bisacodyl (DULCOLAX) EC tablet 5 mg, 5 mg, Oral, Daily PRN, Eiko Mcgowen J, [name] .  cholecalciferol (VITAMIN D3) tablet 1,000 Units, 1,000 Units, Oral, Daily, Ramone Gander J, [name], 1,000 Units at 08/18/18 1002 .  cloNIDine (CATAPRES) tablet 0.1 mg, 0.1 mg, Oral,  BID, Verbena Boeding, Eilene Ghazi, [name], 0.1 mg at 08/18/18 2137 .  diazepam (VALIUM) tablet 5 mg, 5 mg, Oral, Q6H PRN, Elesa Garman J, [name], 5 mg at 08/15/18 2216 .  docusate sodium (COLACE) capsule 100 mg, 100 mg, Oral, BID, Malcomb Gangemi J, [name], 100 mg at 08/18/18 2137 .  glimepiride (AMARYL) tablet 8 mg, 8 mg, Oral, Daily, Reyne Falconi J, [name], 8 mg at 08/18/18 1002 .  lisinopril (PRINIVIL,ZESTRIL) tablet 40 mg, 40 mg, Oral, Daily, Sherlock Nancarrow J, [name], 40 mg at 08/18/18 1002 .   menthol-cetylpyridinium (CEPACOL) lozenge 3 mg, 1 lozenge, Oral, PRN **OR** phenol (CHLORASEPTIC) mouth spray 1 spray, 1 spray, Mouth/Throat, PRN, Sirron Francesconi J, [name], 1 spray at 08/13/18 0856 .  metFORMIN (GLUCOPHAGE-XR) 24 hr tablet 1,500 mg, 1,500 mg, Oral, QPM, Raequan Vanschaick J, [name], 1,500 mg at 08/16/18 1822 .  ondansetron (ZOFRAN) tablet 4 mg, 4 mg, Oral, Q6H PRN **OR** ondansetron (ZOFRAN) injection 4 mg, 4 mg, Intravenous, Q6H PRN, Jenee Spaugh J, [name] .  oxyCODONE-acetaminophen (PERCOCET/ROXICET) 5-325 MG per tablet 1-2 tablet, 1-2 tablet, Oral, Q4H PRN, Dolora Ridgely J, [name], 2 tablet at 08/19/18 0144 .  pantoprazole (PROTONIX) EC tablet 40 mg, 40 mg, Oral, Q supper, Ilda Basset, [place], 40 mg at 08/18/18 1850 .  pioglitazone (ACTOS) tablet 15 mg, 15 mg, Oral, Daily, Meylin Stenzel J, [name], 15 mg at 08/18/18 1003 .  senna-docusate (Senokot-S) tablet 1 tablet, 1 tablet, Oral, QHS PRN, Airlie Blumenberg J, [name] .  sodium chloride flush (NS) 0.9 % injection 3 mL, 3 mL, Intravenous, Q12H, Jacilyn Sanpedro J, [name], 3 mL at 08/17/18 2212 .  sodium chloride flush (NS) 0.9 % injection 3 mL, 3 mL, Intravenous, PRN, Coretta Leisey J, [name] .  sodium phosphate (FLEET) 7-19 GM/118ML enema 1 enema, 1 enema, Rectal, Once PRN, Mccartney Chuba, Eilene Ghazi, [name] Diagnostic Studies: Dg Cervical Spine 1 View  Result Date: 08/12/2018 CLINICAL DATA:  Intraoperative localization for ACDF at C3-4 through C6-7. EXAM: DG C-ARM 61-120 MIN; DG CERVICAL SPINE - 1 VIEW COMPARISON:  None available. FINDINGS: Intraoperative fluoroscopic lateral views of the cervical spine demonstrate ACDF at C3-4, C4-5, C5-6, and C6-7. Hardware appears well positioned without complication. Alignment normal. No adverse features. Fluoroscopic time equals 17 seconds. IMPRESSION: Fluoroscopic intraoperative localization for ACDF at C3-4 through C6-7. Electronically Signed   By: Rise Mu M.D.   On: 08/12/2018 22:19   Dg C-arm  1-60 Min  Result Date: 08/12/2018 CLINICAL DATA:  Intraoperative localization for ACDF at C3-4 through C6-7. EXAM: DG C-ARM 61-120 MIN; DG CERVICAL SPINE - 1 VIEW COMPARISON:  None available. FINDINGS: Intraoperative fluoroscopic lateral views of the cervical spine demonstrate ACDF at C3-4, C4-5, C5-6, and C6-7. Hardware appears well positioned without complication. Alignment normal. No adverse features. Fluoroscopic time equals 17 seconds. IMPRESSION: Fluoroscopic intraoperative localization for ACDF at C3-4 through C6-7. Electronically Signed   By: Rise Mu M.D.   On: 08/12/2018 22:19    Disposition: Discharge disposition: 70-Another Health Care Institution Not Defined       Discharge Instructions    Discharge patient   Complete by:  As directed    D/C to CIR when bed available   Discharge disposition:  70-Another Health Care Institution Not Defined   Discharge patient date:  08/13/2018     PO s/p C3-C7 ACDF, doing well  - encourage ambulation - Percocet for pain, Valium for muscle spasms - may transfer to inpatient rehab once a bed becomes available -Written scripts for  pain signed and in chart -D/C instructions sheet printed and in chart -D/C today  -F/U in office 2 weeks   Signed: Georga BoraKayla J Manley Fason 08/19/2018, 8:39 AM

## 2018-08-19 NOTE — Progress Notes (Signed)
Jamse Arn, MD  Physician  Physical Medicine and Rehabilitation  PMR Pre-admission  Addendum  Date of Service:  08/18/2018 1:40 PM       Related encounter: ED to Hosp-Admission (Discharged) from 08/12/2018 in [place]         Show:Clear all _0 Manual_1 Template_2 Copied  Added by: _3 Retta Diones, RN_4 Jamse Arn, MD  _5 Hover for details PMR Admission Coordinator Pre-Admission Assessment  Patient: Brandon Collins is an 67 y.o., male MRN: [id number] DOB: 1951/06/08 Height: 6' (182.9 cm) Weight: 102.1 kg                                                                                                                                                  Insurance Information HMO:     PPO: Yes     PCP:       IPA:       80/20:       OTHER:  Group # [id number] PRIMARY: [employer] managed      Policy#: [id number]      Subscriber: patient CM Name: Oscar La      Phone#: [phone] X [phone]     Fax#: [phone] Pre-Cert#: [id number] from 12/2 to 08/24/18 with update due on 08/22/18      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 He wasadmitted via ED on 08/12/2018 withnew onset BUE weakness, LLE weakness,balance deficitswith multiple falls as well as difficulty with basic ADLs. He was taken to the OR for ACDF C3-C7 by Dr. Lynann Bologna.Therapy evaluations to be done today and CIR recommended due to recent functional decline.   Past Medical History  Past Medical History:  Diagnosis Date  . Arthritis    "all over" (08/12/2018)  . High cholesterol   . Hypertension   . Type II diabetes mellitus (HCC)     Family History  family history includes Cancer in his mother; Stroke in his father.  Prior Rehab/Hospitalizations: No recent rehab or hospitalizations.  Has the patient had major surgery during 100 days prior to admission? No  Current Medications   Current Facility-Administered Medications:  .  0.9 %  sodium chloride infusion, 250 mL, Intravenous, Continuous, McKenzie, Lennie Muckle, [name], Last Rate: 1 mL/hr at 08/13/18 0037 .  acetaminophen (TYLENOL) tablet 650 mg, 650 mg, Oral, Q4H PRN, 650 mg at 08/17/18 0734 **OR** acetaminophen (TYLENOL) suppository 650 mg, 650 mg, Rectal, Q4H PRN, McKenzie, Kayla J, [name] .  alum & mag hydroxide-simeth (MAALOX/MYLANTA) 200-200-20 MG/5ML suspension 30 mL, 30 mL, Oral, Q6H PRN, McKenzie, Kayla J, [name], 30 mL at 08/15/18 1440 .  amLODipine (NORVASC) tablet 10 mg, 10 mg, Oral, Daily, McKenzie, Kayla J, [name], 10 mg at 08/18/18 1002 .  atorvastatin (LIPITOR) tablet 20 mg, 20 mg, Oral, Once per day on Sun Tue Thu Sat, McKenzie, Kayla J, [name], 20 mg at 08/17/18 1011 .  bisacodyl (DULCOLAX) EC tablet 5 mg, 5 mg, Oral, Daily PRN, McKenzie, Kayla J, [name] .  cholecalciferol (VITAMIN D3) tablet 1,000 Units, 1,000 Units, Oral, Daily, McKenzie, Kayla J, [name], 1,000 Units at  08/18/18 1002 .  cloNIDine (CATAPRES) tablet 0.1 mg, 0.1 mg, Oral, BID, McKenzie, Kayla J, [name], 0.1 mg at 08/18/18 1002 .  diazepam (VALIUM) tablet 5 mg, 5 mg, Oral, Q6H PRN, McKenzie, Kayla J, [name], 5 mg at 08/15/18 2216 .  docusate sodium (COLACE) capsule 100 mg, 100 mg, Oral, BID, McKenzie, Kayla J, [name], 100 mg at 08/18/18 1002 .  glimepiride (AMARYL) tablet 8 mg, 8 mg, Oral, Daily, McKenzie, Kayla J, [name], 8 mg at 08/18/18 1002 .  lisinopril (PRINIVIL,ZESTRIL) tablet 40 mg, 40 mg, Oral, Daily, McKenzie, Kayla J, [name], 40 mg at 08/18/18 1002 .  menthol-cetylpyridinium (CEPACOL) lozenge 3 mg, 1 lozenge, Oral, PRN **OR** phenol (CHLORASEPTIC) mouth spray 1 spray, 1 spray, Mouth/Throat, PRN, McKenzie, Kayla J, [name], 1 spray at 08/13/18 0856 .  metFORMIN (GLUCOPHAGE-XR) 24 hr tablet 1,500 mg, 1,500 mg, Oral, QPM, McKenzie, Kayla J, [name], 1,500 mg at 08/16/18 1822 .  ondansetron (ZOFRAN) tablet 4 mg, 4 mg, Oral, Q6H PRN **OR** ondansetron (ZOFRAN) injection 4 mg, 4 mg, Intravenous, Q6H PRN, McKenzie, Kayla J, [name] .  oxyCODONE-acetaminophen (PERCOCET/ROXICET) 5-325 MG per tablet 1-2 tablet, 1-2 tablet, Oral, Q4H PRN, McKenzie, Kayla J, [name], 2 tablet at 08/17/18 1836 .  pantoprazole (PROTONIX) EC tablet 40 mg, 40 mg, Oral, Q supper, Alvira Philips, [place], 40 mg at 08/17/18 1836 .  pioglitazone (ACTOS) tablet 15 mg, 15 mg, Oral, Daily, McKenzie, Kayla J, [name], 15 mg at 08/18/18 1003 .  senna-docusate (Senokot-S) tablet 1 tablet, 1 tablet, Oral, QHS PRN, McKenzie, Kayla J, [name] .  sodium chloride flush (NS) 0.9 % injection 3 mL, 3 mL, Intravenous, Q12H, McKenzie, Kayla J, [name], 3 mL at 08/17/18 2212 .  sodium chloride flush (NS) 0.9 % injection 3 mL, 3 mL, Intravenous, PRN, McKenzie, Kayla J, [name] .  sodium phosphate (FLEET) 7-19 GM/118ML enema 1 enema, 1 enema, Rectal, Once PRN, McKenzie, Lennie Muckle, [name]  Patients Current Diet:     Diet Order  Diet regular Room service  appropriate? Yes; Fluid consistency: Thin  Diet effective now               Precautions / Restrictions Precautions Precautions: Cervical, Fall Precaution Booklet Issued: No Precaution Comments: Reviewed cervical precautions Cervical Brace: Hard collar, At all times Restrictions Weight Bearing Restrictions: No   Has the patient had 2 or more falls or a fall with injury in the past year?Yes.  Patient and wife report more than 5 falls with recent injury to right knee.  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  Has now retired since surgery.  Home Assistive Devices / Equipment Home Assistive Devices/Equipment: Environmental consultant (specify type), Eyeglasses Home Equipment: Walker - 4 wheels  Prior Device Use: Indicate devices/aids used by the patient prior to current illness, exacerbation or injury? None  Prior Functional Level Prior Function Level of Independence: Independent Comments: works as a Programmer, systems Care: Did the patient need help bathing, dressing, using the toilet or eating?  Independent  Indoor Mobility: Did the patient need assistance with walking from room to room (with or without device)? Independent  Stairs: Did the patient need assistance with internal or external stairs (with or without device)? Independent  Functional Cognition: Did the patient need help planning regular tasks such as shopping or remembering to take medications? Independent  Current Functional Level Cognition  Overall Cognitive Status: Within Functional Limits for tasks assessed Orientation Level: Oriented X4 General Comments: Pt able to follow simple cues and requires increased time for bed mobility and exercises.     Extremity Assessment (includes Sensation/Coordination)  Upper Extremity Assessment: Generalized weakness  Lower Extremity Assessment: Defer to PT evaluation    ADLs  Overall ADL's : Needs  assistance/impaired Eating/Feeding: Sitting, Minimal assistance, Moderate assistance Eating/Feeding Details (indicate cue type and reason): reports difficulty holding onto fork Grooming: Moderate assistance Upper Body Bathing: Moderate assistance, Sitting Lower Body Bathing: +2 for physical assistance, Maximal assistance, Sit to/from stand Lower Body Bathing Details (indicate cue type and reason): partial stand Upper Body Dressing : Maximal assistance, Sitting Lower Body Dressing: Maximal assistance, +2 for physical assistance, Sit to/from stand Lower Body Dressing Details (indicate cue type and reason): partial stand Toilet Transfer: Total assistance(Maxi move for transfer to recliner) Toilet Transfer Details (indicate cue type and reason): Simulated to recliner. Pt attempting sit<>stand with Max A +2-3 but unable to achieve standing posture. Pt requiring Maxi move for total lift to recliner. Pt moitvated to transfer to recliner and reports he likes sitting upright Functional mobility during ADLs: Total assistance(Maxi move) General ADL Comments: Pt completed partial sit<>stand 3x throughout session. Sat EOB about 8 minutes, min guard level. Utilizing [employer] for transfer to recliner. Pt participating in [place] exercises at EOB    Mobility  Overal bed mobility: Needs Assistance Bed Mobility: Supine to Sit, Sit to Sidelying, Rolling Rolling: Min assist Supine to sit: HOB elevated, Max assist, +2 for safety/equipment Sit to sidelying: Mod assist, +2 for physical assistance General bed mobility comments: Pt sitting with HOB elevated in upright position and BLEs extented to long sitting position. Pt declining to lay back in a supine position for log roll. Use of bed pad to bring hips to EOB and Max A    Transfers  Overall transfer level: Needs assistance Equipment used: (+2 face to face with use of gait belt and bed pad) Transfer via Lift Equipment: Letta Median Transfers: Sit to/from  [employer]  to Stand: Max assist, +2 physical assistance, From elevated surface General transfer comment: Sit<>Stand x3 with Max A +2 and stedy. Unable to achieve upright posture. Use of Mavimove to transfer to recliner    Ambulation / Gait / Stairs / Emergency planning/management officer  Ambulation/Gait General Gait Details: unable     Posture / Balance Balance Overall balance assessment: Needs assistance Sitting-balance support: Bilateral upper extremity supported, Feet supported Sitting balance-Leahy Scale: Fair Standing balance-Leahy Scale: Zero Standing balance comment: unable to come to full standing position this session    Special needs/care consideration BiPAP/CPAP No CPM No Continuous Drip IV No Dialysis No     Life Vest No Oxygen No Special Bed No Trach Size No Wound Vac (area) No     Skin Has dressing to anterior neck and C-collar in place.  Has many skin abrasions.                       Bowel mgmt: Last BM 08/16/18 Bladder mgmt: Condom external catheter in place Diabetic mgmt Yes, on oral medications at home.    Previous Home Environment Living Arrangements: Spouse/significant other Available Help at Discharge: Family Type of Home: House Home Layout: One level Home Access: Stairs to enter Entrance Stairs-Rails: Right, Left Entrance Stairs-Number of Steps: 3 Bathroom Shower/Tub: Chiropodist: [place]: No  Discharge Living Setting Plans for Discharge Living Setting: Patient's home, House, Lives with (comment)(Lives with wife.) Type of Home at Discharge: House Discharge Home Layout: One level Discharge Home Access: Stairs to enter Technical brewer of Steps: 3 Discharge Bathroom Shower/Tub: Tub/shower unit, Door Discharge Bathroom Toilet: Handicapped height Discharge Bathroom Accessibility: Yes How Accessible: Accessible via walker Does the patient have any problems obtaining your medications?: No  Social/Family/Support  Systems Patient Roles: Spouse, Parent(Has a wife, 2 daughters, 1 son.) Contact Information: [facility] - spouse Anticipated Caregiver: wife and daughter and son Anticipated Caregiver's Contact Information: Butch Penny - wife [phone] Ability/Limitations of Caregiver: Wife works 3 minutes from home, can provide supervision intermittently.  A son and daughter work but can assist some. Caregiver Availability: Intermittent Discharge Plan Discussed with Primary Caregiver: Yes Is Caregiver In Agreement with Plan?: Yes Does Caregiver/Family have Issues with Lodging/Transportation while Pt is in Rehab?: No  Goals/Additional Needs Patient/Family Goal for Rehab: PT/OT mod I and supervision goals Expected length of stay: 14-18 days Cultural Considerations: [employer] Dietary Needs: Regular diet, thin liquids Equipment Needs: TBD Pt/Family Agrees to Admission and willing to participate: Yes Program Orientation Provided & Reviewed with Pt/Caregiver Including Roles  & Responsibilities: Yes  Decrease burden of Care through IP rehab admission: N/A  Possible need for SNF placement upon discharge: Potentially  Patient Condition: This patient's medical and functional status has changed since the consult dated: 08/13/18 in which the Rehabilitation Physician determined and documented that the patient's condition is appropriate for intensive rehabilitative care in an inpatient rehabilitation facility. See "History of Present Illness" (above) for medical update. Functional changes are:  Currently requiring max assist +2 for sit to stand and max assist +2 and steady for transfers. Patient's medical and functional status update has been discussed with the Rehabilitation physician and patient remains appropriate for inpatient rehabilitation. Will admit to inpatient rehab today.  Preadmission Screen Completed By:  Retta Diones, 08/18/2018 3:55  PM ______________________________________________________________________   Discussed status with Dr. Posey Pronto on 08/18/18 at 1355 and received telephone approval for admission today.  Admission Coordinator:  Retta Diones, time 1554/Date 08/18/18  Revision History

## 2018-08-19 NOTE — Discharge Planning (Signed)
Pt transferred to 4W08 in stable condition. Report given to Sue LushAndrea, RN.

## 2018-08-19 NOTE — Progress Notes (Signed)
Kirsteins, Victorino SparrowAndrew E, MD  Physician  Physical Medicine and Rehabilitation  Consult Note  Signed  Date of Service:  08/13/2018 10:55 AM       Related encounter: ED to Hosp-Admission (Discharged[facility]      Signed      Expand All Collapse All    Show:Clear all [x] Manual[x] Template[] Copied  Added by: [x] Kirsteins, Victorino SparrowAndrew E, MD[x] Love, Evlyn KannerPamela S, [name]  [] Hover for details      Physical Medicine and Rehabilitation Consult   Reason for Consult: Cervical myelopathy Referring Physician: Dr. Yevette Edwardsumonski    HPI: Brandon Collins is a 67 y.o. 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  He was working fulltime till two weeks ago when he started having significant decline. He was admitted via ED on 08/12/2018 with new onset BUE weakness, LLE weakness, balance deficits with multiple falls as well as difficulty with basic ADLs.  He was taken to the OR for ACDF C3-C7 by Dr. Yevette Edwardsumonski.  Therapy evaluations to be done today and CIR recommended due to recent functional decline.    Review of Systems  Constitutional: Negative for chills and fever.  HENT: Positive for hearing loss.   Eyes: Negative for blurred vision and double vision.  Respiratory: Positive for cough and sputum production. Negative for shortness of breath.   Cardiovascular: Negative for chest pain and palpitations.  Gastrointestinal: Negative for abdominal pain, heartburn and nausea.  Genitourinary: Negative for dysuria and urgency.  Musculoskeletal: Positive for myalgias and neck pain.  Skin: Negative for itching and rash.  Neurological: Positive for sensory change, focal weakness and weakness. Negative for dizziness and headaches.         Past Medical History:  Diagnosis Date  . Arthritis    "all over" (08/12/2018)  . High cholesterol   .  Hypertension   . Type II diabetes mellitus (HCC)          Past Surgical History:  Procedure Laterality Date  . ANTERIOR CERVICAL DECOMP/DISCECTOMY FUSION  08/12/2018   CERVICAL 3-4, CERVICAL 4-5, CERVICAL 5-6, CERVICAL 6-7 WITH INSTRUMENTATION AND ALLOGRAFT  . BACK SURGERY    . INGUINAL HERNIA REPAIR  1958   "? side"  . KNEE ARTHROSCOPY Right   . PILONIDAL CYST EXCISION           Family History  Problem Relation Age of Onset  . Cancer Mother   . Stroke Father        multiple     Social History:   Married. Was working as a Lobbyistfork lift driver. He reports that he has quit smoking about 25 years ago. Marland Kitchen. His smoking use included cigarettes. He has a 30.00 pack-year smoking history. He has never used smokeless tobacco. He reports that he drank alcohol. He reports that he does not use drugs.    Allergies: No Known Allergies          Medications Prior to Admission  Medication Sig Dispense Refill  . acetaminophen (TYLENOL) 500 MG tablet Take 1,000 mg by mouth daily as needed for moderate pain or headache.    Marland Kitchen. amLODipine (NORVASC) 10 MG tablet Take 10 mg by mouth daily.    Marland Kitchen. aspirin EC 81 MG tablet Take 81 mg by mouth daily.    Marland Kitchen. atorvastatin (LIPITOR) 20 MG tablet Take 20 mg by mouth 4 (four) times a week. Evern BioSun, Tues, [place], and  Sat  1  . cloNIDine (CATAPRES) 0.1 MG tablet Take 0.1 mg by mouth 2 (two) times daily.    Marland Kitchen glimepiride (AMARYL) 4 MG tablet Take 8 mg by mouth daily.    Marland Kitchen lisinopril (PRINIVIL,ZESTRIL) 40 MG tablet Take 40 mg by mouth daily.    . metFORMIN (GLUCOPHAGE-XR) 500 MG 24 hr tablet Take 1,500 mg by mouth every evening.  1  . pioglitazone (ACTOS) 15 MG tablet Take 15 mg by mouth daily.    Marland Kitchen VITAMIN D PO Take 1 capsule by mouth daily.      Home: Home Living Family/patient expects to be discharged to:: Private residence Living Arrangements: Spouse/significant other  Functional History: Functional Status:   Mobility:  ADL:  Cognition: Cognition Orientation Level: Oriented X4  Blood pressure (!) 171/75, pulse 83, temperature 98.3 F (36.8 C), temperature source Oral, resp. rate (!) 24, height 6' (1.829 m), weight 102.1 kg, SpO2 98 %. Physical Exam  Nursing note and vitals reviewed. Constitutional: He is oriented to person, place, and time. He appears well-developed and well-nourished.  Sitting up in bed. NAD  HENT:  Head: Normocephalic and atraumatic.  Eyes: Pupils are equal, round, and reactive to light.  Neck:  Neck immobilized by collar.   Cardiovascular: Normal rate, regular rhythm and normal heart sounds. Exam reveals no friction rub.  No murmur heard. Respiratory: Effort normal and breath sounds normal. No stridor. No respiratory distress. He has no wheezes.  GI: Soft. Bowel sounds are normal. He exhibits no distension. There is no tenderness.  Neurological: He is alert and oriented to person, place, and time.  Mild dysarthria with wet voice.   Psychiatric: He has a normal mood and affect.  Motor strength is 3- right deltoid 4- left deltoid 4- right bicep tricep 4 at the left bicep tricep 4 bilateral grip 4 at the hand intrinsics 3- bilateral hip flexors 3- bilateral knee extensors 4- bilateral ankle dorsiflexor Sensation reduced bilateral C6 intact bilateral C5 C7-C8 pinprick.  Intact bilateral lower limb Tone no evidence of clonus Sitting balance is good         Assessment/Plan: Diagnosis: Cervical myelopathy with quadriparesis that is post ACDF C3-C7 1. Does the need for close, 24 hr/day medical supervision in concert with the patient's rehab needs make it unreasonable for this patient to be served in a less intensive setting? Yes 2. Co-Morbidities requiring supervision/potential complications: Hypertension, type 2 diabetes 3. Due to bladder management, bowel management, safety, skin/wound care, disease management, medication administration, pain management and  patient education, does the patient require 24 hr/day rehab nursing? Yes 4. Does the patient require coordinated care of a physician, rehab nurse, PT (1-2 hrs/day, 5 days/week) and OT (1-2 hrs/day, 5 days/week) to address physical and functional deficits in the context of the above medical diagnosis(es)? Yes Addressing deficits in the following areas: balance, endurance, locomotion, strength, transferring, bowel/bladder control, bathing, dressing, feeding, grooming, toileting and psychosocial support 5. Can the patient actively participate in an intensive therapy program of at least 3 hrs of therapy per day at least 5 days per week? Potentially 6. The potential for patient to make measurable gains while on inpatient rehab is excellent 7. Anticipated functional outcomes upon discharge from inpatient rehab are modified independent and supervision  with PT, modified independent and supervision with OT, n/a with SLP. 8. Estimated rehab length of stay to reach the above functional goals is: 14-18d 9. Anticipated D/C setting: Home 10. Anticipated post D/C treatments: HH therapy 11. Overall Rehab/Functional  Prognosis: excellent  RECOMMENDATIONS: This patient's condition is appropriate for continued rehabilitative care in the following setting: CIR Patient has agreed to participate in recommended program. Yes Note that insurance prior authorization may be required for reimbursement for recommended care.  Comment:  "I have personally performed a face to face diagnostic evaluation of this patient.  Additionally, I have reviewed and concur with the physician assistant's documentation above." Erick Colace M.D. [facility] FAAPM&R (Sports Med, Neuromuscular Med) Diplomate Am Board of Electrodiagnostic Med  Jacquelynn Cree, [name] 08/13/2018        Revision History                        Routing History

## 2018-08-19 NOTE — Progress Notes (Signed)
Bilateral lower extremity venous duplex completed.  Preliminary results in CV Proc.  Blanch MediaMegan Drevon Plog 08/19/2018 4:17 PM

## 2018-08-20 ENCOUNTER — Inpatient Hospital Stay (HOSPITAL_COMMUNITY): Payer: Managed Care, Other (non HMO) | Admitting: Occupational Therapy

## 2018-08-20 ENCOUNTER — Inpatient Hospital Stay (HOSPITAL_COMMUNITY): Payer: Managed Care, Other (non HMO) | Admitting: Physical Therapy

## 2018-08-20 ENCOUNTER — Inpatient Hospital Stay (HOSPITAL_COMMUNITY): Payer: Managed Care, Other (non HMO)

## 2018-08-20 DIAGNOSIS — E669 Obesity, unspecified: Secondary | ICD-10-CM

## 2018-08-20 DIAGNOSIS — R7989 Other specified abnormal findings of blood chemistry: Secondary | ICD-10-CM

## 2018-08-20 DIAGNOSIS — E1169 Type 2 diabetes mellitus with other specified complication: Secondary | ICD-10-CM

## 2018-08-20 DIAGNOSIS — D72823 Leukemoid reaction: Secondary | ICD-10-CM

## 2018-08-20 DIAGNOSIS — M25571 Pain in right ankle and joints of right foot: Secondary | ICD-10-CM

## 2018-08-20 LAB — CBC WITH DIFFERENTIAL/PLATELET
Abs Immature Granulocytes: 0.32 10*3/uL — ABNORMAL HIGH (ref 0.00–0.07)
Basophils Absolute: 0.1 10*3/uL (ref 0.0–0.1)
Basophils Relative: 0 %
Eosinophils Absolute: 0.1 10*3/uL (ref 0.0–0.5)
Eosinophils Relative: 1 %
HCT: 43.9 % (ref 39.0–52.0)
Hemoglobin: 14.2 g/dL (ref 13.0–17.0)
Immature Granulocytes: 2 %
Lymphocytes Relative: 10 %
Lymphs Abs: 1.6 10*3/uL (ref 0.7–4.0)
MCH: 28.7 pg (ref 26.0–34.0)
MCHC: 32.3 g/dL (ref 30.0–36.0)
MCV: 88.9 fL (ref 80.0–100.0)
Monocytes Absolute: 2.3 10*3/uL — ABNORMAL HIGH (ref 0.1–1.0)
Monocytes Relative: 15 %
Neutro Abs: 11.4 10*3/uL — ABNORMAL HIGH (ref 1.7–7.7)
Neutrophils Relative %: 72 %
Platelets: 325 10*3/uL (ref 150–400)
RBC: 4.94 MIL/uL (ref 4.22–5.81)
RDW: 12.9 % (ref 11.5–15.5)
WBC: 15.8 10*3/uL — ABNORMAL HIGH (ref 4.0–10.5)
nRBC: 0 % (ref 0.0–0.2)

## 2018-08-20 LAB — COMPREHENSIVE METABOLIC PANEL
ALT: 158 U/L — ABNORMAL HIGH (ref 0–44)
AST: 105 U/L — ABNORMAL HIGH (ref 15–41)
Albumin: 2.7 g/dL — ABNORMAL LOW (ref 3.5–5.0)
Alkaline Phosphatase: 163 U/L — ABNORMAL HIGH (ref 38–126)
Anion gap: 19 — ABNORMAL HIGH (ref 5–15)
BUN: 41 mg/dL — ABNORMAL HIGH (ref 8–23)
CO2: 21 mmol/L — ABNORMAL LOW (ref 22–32)
Calcium: 8.8 mg/dL — ABNORMAL LOW (ref 8.9–10.3)
Chloride: 92 mmol/L — ABNORMAL LOW (ref 98–111)
Creatinine, Ser: 1.4 mg/dL — ABNORMAL HIGH (ref 0.61–1.24)
GFR calc Af Amer: 60 mL/min — ABNORMAL LOW (ref >60)
GFR calc non Af Amer: 52 mL/min — ABNORMAL LOW (ref >60)
Glucose, Bld: 107 mg/dL — ABNORMAL HIGH (ref 70–99)
Potassium: 4 mmol/L (ref 3.5–5.1)
Sodium: 132 mmol/L — ABNORMAL LOW (ref 135–145)
Total Bilirubin: 1.9 mg/dL — ABNORMAL HIGH (ref 0.3–1.2)
Total Protein: 6.3 g/dL — ABNORMAL LOW (ref 6.5–8.1)

## 2018-08-20 LAB — GLUCOSE, CAPILLARY
Glucose-Capillary: 85 mg/dL (ref 70–99)
Glucose-Capillary: 94 mg/dL (ref 70–99)
Glucose-Capillary: 121 mg/dL — ABNORMAL HIGH (ref 70–99)
Glucose-Capillary: 212 mg/dL — ABNORMAL HIGH (ref 70–99)

## 2018-08-20 MED ORDER — ENOXAPARIN SODIUM 30 MG/0.3ML ~~LOC~~ SOLN
30.0000 mg | Freq: Two times a day (BID) | SUBCUTANEOUS | Status: DC
  Administered 2018-08-20 – 2018-09-18 (×59): 30 mg via SUBCUTANEOUS
  Filled 2018-08-20 (×60): qty 0.3

## 2018-08-20 NOTE — Progress Notes (Signed)
PHYSICAL MEDICINE & REHABILITATION PROGRESS NOTE   Subjective/Complaints: No new complaints. Had reasonable night. Neck and right ankle sore. Able to sleep. Ready for therapy today. Hasn't moved bowels yet  ROS: Patient denies fever, rash, sore throat, blurred vision, nausea, vomiting, diarrhea, cough, shortness of breath or chest pain, joint or back pain, headache, or mood change.    Objective:   Vas [place] Lower Extremity Venous (dvt)  Result Date: 08/19/2018  Lower Venous Study Indications: Edema, and Swelling.  Limitations: Patient positioning. Performing Technologist: Blanch Media RVS  Examination Guidelines: A complete evaluation includes B-mode imaging, spectral Doppler, color Doppler, and power Doppler as needed of all accessible portions of each vessel. Bilateral testing is considered an integral part of a complete examination. Limited examinations for reoccurring indications may be performed as noted.  Right Venous Findings: +---------+---------------+---------+-----------+----------+-------+          CompressibilityPhasicitySpontaneityPropertiesSummary +---------+---------------+---------+-----------+----------+-------+ CFV      Full           Yes      Yes                          +---------+---------------+---------+-----------+----------+-------+ SFJ      Full                                                 +---------+---------------+---------+-----------+----------+-------+ FV Prox  Full                                                 +---------+---------------+---------+-----------+----------+-------+ FV Mid   Full                                                 +---------+---------------+---------+-----------+----------+-------+ FV DistalFull                                                 +---------+---------------+---------+-----------+----------+-------+ PFV      Full                                                  +---------+---------------+---------+-----------+----------+-------+ POP      Full           Yes      Yes                          +---------+---------------+---------+-----------+----------+-------+ PTV      Full                                                 +---------+---------------+---------+-----------+----------+-------+ PERO     Full                                                 +---------+---------------+---------+-----------+----------+-------+  Left Venous Findings: +---------+---------------+---------+-----------+----------+-------+          CompressibilityPhasicitySpontaneityPropertiesSummary +---------+---------------+---------+-----------+----------+-------+ CFV      Full           Yes      Yes                          +---------+---------------+---------+-----------+----------+-------+ SFJ      Full                                                 +---------+---------------+---------+-----------+----------+-------+ FV Prox  Full                                                 +---------+---------------+---------+-----------+----------+-------+ FV Mid   Full                                                 +---------+---------------+---------+-----------+----------+-------+ FV DistalFull                                                 +---------+---------------+---------+-----------+----------+-------+ PFV      Full                                                 +---------+---------------+---------+-----------+----------+-------+ POP      Full           Yes      Yes                          +---------+---------------+---------+-----------+----------+-------+ PTV      Full                                                 +---------+---------------+---------+-----------+----------+-------+ PERO     Full                                                  +---------+---------------+---------+-----------+----------+-------+    Summary: Right: There is no evidence of deep vein thrombosis in the lower extremity. No cystic structure found in the popliteal fossa. Left: There is no evidence of deep vein thrombosis in the lower extremity. No cystic structure found in the popliteal fossa.  *See table(s) above for measurements and observations.    Preliminary    Recent Labs    08/20/18 0607  WBC 15.8*  HGB 14.2  HCT 43.9  PLT 325   Recent Labs    08/20/18 0607  NA 132*  K 4.0  CL 92*  CO2 21*  GLUCOSE  107*  BUN 41*  CREATININE 1.40*  CALCIUM 8.8*    Intake/Output Summary (Last 24 hours) at 08/20/2018 0903 Last data filed at 08/19/2018 1700 Gross per 24 hour  Intake 120 ml  Output -  Net 120 ml     Physical Exam: Vital Signs Blood pressure 115/73, pulse 91, temperature 98.3 F (36.8 C), temperature source Oral, resp. rate 17, height 6' (1.829 m), weight 103.3 kg, SpO2 100 %.  Constitutional: No distress . Vital signs reviewed. HEENT: EOMI, oral membranes moist Neck: cervical collar Cardiovascular: RRR without murmur. No JVD    Respiratory: CTA Bilaterally without wheezes or rales. Normal effort    GI: BS +, non-tender, non-distended   Musculoskeletal:  Lower extremity edema R>L. Right ankle/foot tender to palpation Neurological: He is alert and oriented to person, place, and time.  Motor: Right upper extremity: 4+/5 proximal distal Left upper extremity: 5/5 proximal distal Bilateral lower extremities: R:LE: 1/5 Hip flexion,1/5  knee extension 0/5, ankle dorsiflexion, APF 1-2/5, pain related also. LLE: 2-3/5 prox to distal.  Sensation intact light touch  Skin: Skin is warm and dry.  Surgical site clean and dry  Psychiatric: cooperativel.    Assessment/Plan: 1. Functional deficits secondary to cervical myelopathy which require 3+ hours per day of interdisciplinary therapy in a comprehensive inpatient rehab  setting.  Physiatrist is providing close team supervision and 24 hour management of active medical problems listed below.  Physiatrist and rehab team continue to assess barriers to discharge/monitor patient progress toward functional and medical goals  Care Tool:  Bathing              Bathing assist       Upper Body Dressing/Undressing Upper body dressing        Upper body assist      Lower Body Dressing/Undressing Lower body dressing            Lower body assist       Toileting Toileting    Toileting assist Assist for toileting: Minimal Assistance - Patient > 75%(only used urinal)     Transfers Chair/bed transfer  Transfers assist           Locomotion Ambulation   Ambulation assist              Walk 10 feet activity   Assist           Walk 50 feet activity   Assist           Walk 150 feet activity   Assist           Walk 10 feet on uneven surface  activity   Assist           Wheelchair     Assist               Wheelchair 50 feet with 2 turns activity    Assist            Wheelchair 150 feet activity     Assist           Medical Problem List and Plan: 1.  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 Cervical collar at all times  -beginning therapies today 2.  DVT Prophylaxis/Anticoagulation: SCDs. Vascular study results pending  -recommend lovenox 30mg  q12 for proph 3. Pain Management:  Oxycodone and Valium as needed.  -right ankle pain/swelling. Pt reports "twisting" ankle  -will check xrays right ankle 4. Mood:  Provide emotional support 5. Neuropsych: This  patient is capable of making decisions on his own behalf. 6. Skin/Wound Care:  Routine skin checks 7. Fluids/Electrolytes/Nutrition:  encourage PO  I personally reviewed the patient's labs today.    -BUN elevated---encourage PO  -recheck labs tomorrow 8. Diabetes mellitus.  Glucophage 1500 mg daily, Actos 15 mg daily, Amaryl 8 mg daily.  -sugars well controlled 12/5 9. Hypertension. Lisinopril 40 mg daily, clonidine 0.1 mg twice a day, Norvasc 10 mg daily. Monitor with increased mobility 10. Hyperlipidemia. Lipitor 11. Constipation. Laxative assistance 12. Leukocytosis: 15.8 today, no clinical signs of infection    -urine clear without odor   -observe for now, recheck cbc in am  LOS: 1 days A FACE TO FACE EVALUATION WAS PERFORMED  Brandon Collins 08/20/2018, 9:03 AM

## 2018-08-20 NOTE — Discharge Instructions (Signed)
Inpatient Rehab Discharge Instructions  Brandon OchsCarl S Collins Discharge date and time: No discharge date for patient encounter.   Activities/Precautions/ Functional Status: Activity: cervical collar at all times Diet: diabetic diet Wound Care: keep wound clean and dry Functional status:  ___ No restrictions     ___ Walk up steps independently ___ 24/7 supervision/assistance   ___ Walk up steps with assistance ___ Intermittent supervision/assistance  ___ Bathe/dress independently ___ Walk with walker     _x__ Bathe/dress with assistance ___ Walk Independently    ___ Shower independently ___ Walk with assistance    ___ Shower with assistance ___ No alcohol     ___ Return to work/school ________  Special Instructions: No driving   My questions have been answered and I understand these instructions. I will adhere to these goals and the provided educational materials after my discharge from the hospital.  Patient/Caregiver Signature _______________________________ Date __________  Clinician Signature _______________________________________ Date __________  Please bring this form and your medication list with you to all your follow-up doctor's appointments.

## 2018-08-20 NOTE — Evaluation (Signed)
Occupational Therapy Assessment and Plan  Patient Details  Name: Brandon Collins MRN: [id number] Date of Birth: [birth year]  OT Diagnosis: cognitive deficits and muscle weakness (generalized) Rehab Potential: Rehab Potential (ACUTE ONLY): Good ELOS: 21-25 days   Today's Date: 08/20/2018 OT Individual Time: 1300-1405 OT Individual Time Calculation (min): 65 min     Problem List:  Patient Active Problem List   Diagnosis Date Noted  . Cervical myelopathy ([place]) 08/19/2018  . Cervical cord compression with myelopathy ([place])   . Postoperative pain   . Diabetes mellitus type 2 in nonobese (HCC)   . Essential hypertension   . Dyslipidemia   . Drug induced constipation   . Myelopathy ([place]) 08/12/2018    Past Medical History:  Past Medical History:  Diagnosis Date  . Arthritis    "all over" (08/12/2018)  . High cholesterol   . Hypertension   . Type II diabetes mellitus ([place])    Past Surgical History:  Past Surgical History:  Procedure Laterality Date  . ANTERIOR CERVICAL DECOMP/DISCECTOMY FUSION  08/12/2018   CERVICAL 3-4, CERVICAL 4-5, CERVICAL 5-6, CERVICAL 6-7 WITH INSTRUMENTATION AND ALLOGRAFT  . ANTERIOR CERVICAL DECOMPRESSION/DISCECTOMY FUSION 4 LEVELS N/A 08/12/2018   Procedure: ANTERIOR CERVICAL DECOMPRESSION FUSION CERVICAL 3-4, CERVICAL 4-5, CERVICAL 5-6, CERVICAL 6-7 WITH INSTRUMENTATION AND ALLOGRAFT;  Surgeon: Phylliss Bob, MD;  Location: [place];  Service: Orthopedics;  Laterality: N/A;  ANTERIOR CERVICAL DECOMPRESSION FUSION CERVICAL 3-4, CERVICAL 4-5, CERVICAL 5-6, CERVICAL 6-7 WITH INSTRUMENTATION AND ALLOGRAFT  . BACK SURGERY    . [place]   "? side"  . KNEE ARTHROSCOPY Right   . PILONIDAL CYST EXCISION      Assessment & Plan Clinical Impression: Brandon Collins is a 67 year old right-handed male history of hypertension, type 2 diabetes mellitus, cervical myelopathy with cord compression and myelomalacia C3-C7 with progressive decline for the past  6 months. Per chart review and patient, patient lives with spouse. One level home 3 steps to entry. Independent working up until 2 weeks ago.presented 08/12/2018 with new onset of bilateral upper extremity weakness, left lower extremity weakness and balance deficits with noted falls as well as difficulty with basic ADLs.underwent anterior cervical decompression and fusion C3-4, 4-5, 5-6 and C6-7 as well as placement of anterior instrumentation per Dr. Lynann Bologna. Hospital course pain management. Cervical collar at all times. Therapy evaluations completed with recommendations of physical medicine rehabilitation consult. Patient was admitted for a comprehensive rehabilitation program.Patient transferred to CIR on 08/19/2018 .    Patient currently requires total with basic self-care skills secondary to muscle weakness, decreased cardiorespiratoy endurance, unbalanced muscle activation, decreased coordination and decreased motor planning, decreased self-monitoring and decreased sitting balance, decreased standing balance, decreased postural control, decreased balance strategies and difficulty maintaining precautions.  Prior to hospitalization, patient could complete ADLs/IADLs with independence.  Patient will benefit from skilled intervention to decrease level of assist with basic self-care skills prior to discharge home with care partner.  Anticipate patient will require minimal physical assistance and follow up home health.  OT - End of Session Activity Tolerance: Tolerates 10 - 20 min activity with multiple rests Endurance Deficit: Yes Endurance Deficit Description: fatigues quickly with all activity OT Assessment Rehab Potential (ACUTE ONLY): Good OT Patient demonstrates impairments in the following area(s): Balance;Pain;Perception;Cognition;Safety;Sensory;Motor;Skin Integrity;Endurance;Edema OT Basic ADL's Functional Problem(s): Eating;Grooming;Bathing;Dressing;Toileting OT Transfers Functional  Problem(s): Toilet;Tub/Shower OT Additional Impairment(s): None OT Plan OT Intensity: Minimum of 1-2 x/day, 45 to 90 minutes OT Frequency: 5 out of 7 days OT  Duration/Estimated Length of Stay: 21-25 days OT Treatment/Interventions: Balance/vestibular training;Community reintegration;Disease mangement/prevention;Neuromuscular re-education;Patient/family education;Self Care/advanced ADL retraining;Splinting/orthotics;Therapeutic Exercise;UE/LE Coordination activities;Wheelchair propulsion/positioning;UE/LE Strength taining/ROM;Therapeutic Activities;Skin care/wound managment;Psychosocial support;Pain management;Functional mobility training;DME/adaptive equipment instruction;Discharge planning;Cognitive remediation/compensation OT Self Feeding Anticipated Outcome(s): set up OT Basic Self-Care Anticipated Outcome(s): min A OT Toileting Anticipated Outcome(s): min A OT Bathroom Transfers Anticipated Outcome(s): min A OT Recommendation Recommendations for Other Services: Speech consult;Neuropsych consult Patient destination: Home Follow Up Recommendations: Home health OT Equipment Recommended: Tub/shower bench;3 in 1 bedside comode   Skilled Therapeutic Intervention Skilled OT evaluation completed. Pt edu on OT POC, ELOS, and rehab expectations. Pt in no pain at rest but pain rises to 10/10 with any tactile input of B LE, especially R LE. Pt required max +2 to roll R and L. Min A to feed self lunch. Significant coughing and throat clearing with thin liquids and spaghetti- attempted to control bite size, pacing, and position of pt with no success, meal was terminated d/t aspiration risk- RN alerted to contact PA for SLP evaluation. Pt completed sidelying to EOB transfer with total A +2. Once EOB pt able to maintain static sitting balance with fluctuating min A to CGA. Pt donned shirt with min A. Total A +2 required for pants. Pt was left lying supine with all needs met and bed alarm set.   OT  Evaluation Precautions/Restrictions  Precautions Precautions: Cervical;Fall Precaution Comments: Reviewed cervical precautions Required Braces or Orthoses: Cervical Brace Cervical Brace: Hard collar;At all times Restrictions Weight Bearing Restrictions: No General Chart Reviewed: Yes Family/Caregiver Present: No Vital Signs Therapy Vitals Temp: (!) 97.4 F (36.3 C) Pulse Rate: 93 BP: 124/71 Patient Position (if appropriate): Lying Oxygen Therapy SpO2: 95 % O2 Device: Room Air Pain Pain Assessment Pain Scale: 0-10 Pain Score: 9  Pain Type: Acute pain Pain Location: Leg Pain Orientation: Left;Right Pain Descriptors / Indicators: Sharp Pain Onset: With Activity Pain Intervention(s): Emotional support;Relaxation;RN made aware;Repositioned Home Living/Prior Functioning Home Living Family/patient expects to be discharged to:: Private residence Living Arrangements: Alone Available Help at Discharge: Family, Available PRN/intermittently Type of Home: House Home Access: Stairs to enter Technical brewer of Steps: 3 Entrance Stairs-Rails: Right, Left Home Layout: One level Bathroom Shower/Tub: Chiropodist: Standard  Lives With: Spouse IADL History Homemaking Responsibilities: Yes Meal Prep Responsibility: Secondary Laundry Responsibility: Secondary Cleaning Responsibility: Secondary Bill Paying/Finance Responsibility: Secondary Shopping Responsibility: Secondary Current License: Yes Mode of Transportation: Car Occupation: Full time employment Prior Function Level of Independence: Independent with gait, Independent with transfers, Independent with basic ADLs  Able to Take Stairs?: Yes Driving: Yes Vocation: Full time employment 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, sports Baseline Vision/History: Wears glasses Patient Visual Report: No change from baseline Vision Assessment?: No apparent visual  deficits Perception  Perception: Within Functional Limits Praxis Praxis: Intact Cognition Overall Cognitive Status: Impaired/Different from baseline Arousal/Alertness: Awake/alert Orientation Level: Person;Place Year: 2019 Month: December Day of Week: Incorrect Memory: Impaired Memory Impairment: Decreased recall of new information;Decreased short term memory Decreased Short Term Memory: Verbal basic;Functional basic Immediate Memory Recall: Sock;Blue;Bed Memory Recall: Sock Attention: Selective Sustained Attention: Appears intact Selective Attention: Impaired Selective Attention Impairment: Functional basic Awareness: Appears intact Problem Solving: Appears intact Safety/Judgment: Appears intact Sensation Sensation Light Touch: Appears Intact(reports N/T in B LE) Proprioception: Impaired Detail Proprioception Impaired Details: Absent RLE;Absent LLE Coordination Gross Motor Movements are Fluid and Coordinated: No Fine Motor Movements are Fluid and Coordinated: No Coordination and Movement Description: 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 Finger  Nose Finger Test: Overshoots, slow Motor  Motor Motor: Tetraplegia Mobility  Bed Mobility Bed Mobility: Rolling Right;Rolling Left;Supine to Sit;Sit to Supine Rolling Right: 2 Helpers Rolling Left: 2 Helpers Supine to Sit: 2 Helpers Sit to Supine: 2 Helpers  Trunk/Postural Assessment  Cervical Assessment Cervical Assessment: Exceptions to WFL(limited d/t cervical precautions) Thoracic Assessment Thoracic Assessment: Exceptions to WFL(rounded shoulders) Lumbar Assessment Lumbar Assessment: Exceptions to WFL(posterior pelvic tilt) Postural Control Postural Control: Deficits on evaluation Trunk Control: impaired Righting Reactions: delayed  Balance Balance Balance Assessed: Yes Static Sitting Balance Static Sitting - Balance Support: Feet supported;Bilateral upper extremity supported Static Sitting - Level of  Assistance: 5: Stand by assistance Dynamic Sitting Balance Dynamic Sitting - Balance Support: Feet supported;No upper extremity supported;During functional activity Dynamic Sitting - Level of Assistance: 3: Mod assist Dynamic Sitting - Balance Activities: Other (comment) Sitting balance - Comments: reaching distally for LB clothing Extremity/Trunk Assessment RUE Assessment RUE Assessment: Exceptions to [facility] Active Range of Motion (AROM) Comments: WFL General Strength Comments: NT d/t cervical sx. Normal grasp strength in R UE but still needs assistance with holding utensils LUE Assessment LUE Assessment: Exceptions to [facility] Active Range of Motion (AROM) Comments: WFL General Strength Comments: NT d/t cervical precautions     Refer to Care Plan for Long Term Goals  Recommendations for other services: Neuropsych   Discharge Criteria: Patient will be discharged from OT if patient refuses treatment 3 consecutive times without medical reason, if treatment goals not met, if there is a change in medical status, if patient makes no progress towards goals or if patient is discharged from hospital.  The above assessment, treatment plan, treatment alternatives and goals were discussed and mutually agreed upon: by patient  Curtis Sites 08/20/2018, 2:32 PM

## 2018-08-20 NOTE — Progress Notes (Signed)
Inpatient Rehabilitation  Patient information reviewed and entered into eRehab system by Shaylen Nephew M. Arvie Villarruel, M.A., CCC/SLP, PPS Coordinator.  Information including medical coding, functional ability and quality indicators will be reviewed and updated through discharge.    Per nursing patient was given "Data Collection Information Summary for Patients in Inpatient Rehabilitation Facilities with attached "Privacy Act Statement-Health Care Records" upon admission present in education notebook.  

## 2018-08-20 NOTE — Progress Notes (Signed)
Physical Therapy Assessment and Plan  Patient Details  Name: Brandon Collins MRN: [id number] Date of Birth: 06/24/1951  PT Diagnosis: Muscle weakness and Quadriplegia Rehab Potential: Fair ELOS: 21-24   Today's Date: 08/20/2018 PT Individual Time: 9833-8250 PT Individual Time Calculation (min): 75 min    Problem List:  Patient Active Problem List   Diagnosis Date Noted  . Cervical myelopathy ([place]) 08/19/2018  . Cervical cord compression with myelopathy ([place])   . Postoperative pain   . Diabetes mellitus type 2 in nonobese (HCC)   . Essential hypertension   . Dyslipidemia   . Drug induced constipation   . Myelopathy ([place]) 08/12/2018    Past Medical History:  Past Medical History:  Diagnosis Date  . Arthritis    "all over" (08/12/2018)  . High cholesterol   . Hypertension   . Type II diabetes mellitus ([place])    Past Surgical History:  Past Surgical History:  Procedure Laterality Date  . ANTERIOR CERVICAL DECOMP/DISCECTOMY FUSION  08/12/2018   CERVICAL 3-4, CERVICAL 4-5, CERVICAL 5-6, CERVICAL 6-7 WITH INSTRUMENTATION AND ALLOGRAFT  . ANTERIOR CERVICAL DECOMPRESSION/DISCECTOMY FUSION 4 LEVELS N/A 08/12/2018   Procedure: ANTERIOR CERVICAL DECOMPRESSION FUSION CERVICAL 3-4, CERVICAL 4-5, CERVICAL 5-6, CERVICAL 6-7 WITH INSTRUMENTATION AND ALLOGRAFT;  Surgeon: Phylliss Bob, MD;  Location: [place];  Service: Orthopedics;  Laterality: N/A;  ANTERIOR CERVICAL DECOMPRESSION FUSION CERVICAL 3-4, CERVICAL 4-5, CERVICAL 5-6, CERVICAL 6-7 WITH INSTRUMENTATION AND ALLOGRAFT  . BACK SURGERY    . [place]   "? side"  . KNEE ARTHROSCOPY Right   . PILONIDAL CYST EXCISION      Assessment & Plan Clinical Impression:  Brandon Collins is a 67 year old right-handed male history of hypertension, type 2 diabetes mellitus, cervical myelopathy with cord compression and myelomalacia C3-C7 with progressive decline for the past 6 months. Per chart review and patient, patient lives with  spouse. One level home 3 steps to entry. Independent working up until 2 weeks ago.presented 08/12/2018 with new onset of bilateral upper extremity weakness, left lower extremity weakness and balance deficits with noted falls as well as difficulty with basic ADLs.underwent anterior cervical decompression and fusion C3-4, 4-5, 5-6 and C6-7 as well as placement of anterior instrumentation per Dr. Lynann Bologna. Hospital course pain management. Cervical collar at all times. Therapy evaluations completed with recommendations of physical medicine rehabilitation consult. Patient was admitted for a comprehensive rehabilitation program. Patient transferred to CIR on 08/19/2018 .   Patient currently requires total with mobility secondary to muscle weakness, unbalanced muscle activation and decreased coordination and decreased sitting balance, decreased standing balance, decreased postural control and decreased balance strategies.  Prior to hospitalization, patient was independent  with mobility and lived with Spouse in a House home.  Home access is 3Stairs to enter.  Patient will benefit from skilled PT intervention to maximize safe functional mobility, minimize fall risk and decrease caregiver burden for planned discharge home with 24 hour assist.  Anticipate patient will benefit from follow up [facility] at discharge.  PT - End of Session Activity Tolerance: Tolerates 10 - 20 min activity with multiple rests Endurance Deficit: Yes Endurance Deficit Description: fatigues quickly with all activity PT Assessment Rehab Potential (ACUTE/IP ONLY): Fair PT Barriers to Discharge: Decreased caregiver support;Medical stability;Home environment access/layout PT Patient demonstrates impairments in the following area(s): Balance;Endurance;Motor;Pain;Safety;Sensory PT Transfers Functional Problem(s): Bed Mobility;Bed to Chair;Car;Furniture;Floor PT Locomotion Functional Problem(s): Ambulation;Wheelchair Mobility;Stairs PT Plan PT  Intensity: Minimum of 1-2 x/day ,45 to 90 minutes PT Frequency:  5 out of 7 days PT Duration Estimated Length of Stay: 21-24 PT Treatment/Interventions: Balance/vestibular training;Community reintegration;Discharge planning;Disease management/prevention;DME/adaptive equipment instruction;Functional electrical stimulation;Functional mobility training;Neuromuscular re-education;Pain management;Patient/family education;Psychosocial support;Skin care/wound management;Stair training;Therapeutic Activities;Therapeutic Exercise;UE/LE Strength taining/ROM;UE/LE Coordination activities;Wheelchair propulsion/positioning;Ambulation/gait training PT Transfers Anticipated Outcome(s): Min A PT Locomotion Anticipated Outcome(s): Min A at w/c level PT Recommendation Recommendations for Other Services: Neuropsych consult Follow Up Recommendations: Home health PT Patient destination: Home Equipment Recommended: To be determined Equipment Details: TBD pending progress  Skilled Therapeutic Intervention Evaluation completed (see details above and below) with education on PT POC and goals and individual treatment initiated with focus on bed mobility, education with patient about rehab goals, LOS, rehab schedule, etc. Pt received seated in bed with use of pillows to assist with maintaining upright position. Sitting in bed to sitting EOB with assist x 2. Pt has significant increase in BLE pain with mobility. Attempt sit to stand to stedy with assist x 2, pt unable to assist with standing. Sit to supine assist x 2 for trunk control and BLE management. Dependent to scoot to [facility]. Pt has incontinence of urine while laying in bed. Rolling L/R with assist x 2 and use of bedrails as well as skilled cueing for hand placement with transfer. Pt has increase in BLE pain with rolling and exhibits poor tolerance to position changes. Assisted pt with dependent pericare and donning of brief. Pt left seated in bed with needs in reach set up  for breakfast.  PT Evaluation Precautions/Restrictions Precautions Precautions: Cervical;Fall Precaution Comments: Reviewed cervical precautions Required Braces or Orthoses: Cervical Brace Cervical Brace: Hard collar;At all times Restrictions Weight Bearing Restrictions: No General   Vital SignsTherapy Vitals BP: 115/73 Home Living/Prior Functioning Home Living Available Help at Discharge: Family;Available PRN/intermittently Type of Home: House Home Access: Stairs to enter [employer] of Steps: 3 Entrance Stairs-Rails: Right;Left Home Layout: One level  Lives With: Spouse Prior Function Level of Independence: Independent with gait;Independent with transfers  Able to Take Stairs?: Yes Driving: Yes Vocation: Full time employment Vocation Requirements: Freight forwarder Vision/Perception  Vision - History Baseline Vision: Wears glasses only for reading Perception Perception: Within Functional Limits Praxis Praxis: Intact  Cognition Overall Cognitive Status: Within Functional Limits for tasks assessed Arousal/Alertness: Awake/alert Orientation Level: Oriented X4 Attention: Sustained Sustained Attention: Impaired Memory: Impaired Memory Impairment: Decreased recall of new information;Decreased short term memory Awareness: Appears intact Problem Solving: Appears intact Safety/Judgment: Appears intact Sensation Sensation Light Touch: Appears Intact(reports N/T in BUE) Proprioception: Impaired Detail Proprioception Impaired Details: Absent RLE;Absent LLE Coordination Gross Motor Movements are Fluid and Coordinated: No Fine Motor Movements are Fluid and Coordinated: No Coordination and Movement Description: 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 Motor  Motor Motor: Tetraplegia  Mobility Bed Mobility Bed Mobility: Rolling Right;Rolling Left;Supine to Sit;Sit to Supine Rolling Right: 2 Helpers Rolling Left: 2 Helpers Supine to Sit: 2 Helpers Sit to  Supine: 2 Geologist, engineering / Additional Locomotion Stairs: No Wheelchair Mobility Wheelchair Mobility: No  Trunk/Postural Assessment  Cervical Assessment Cervical Assessment: Exceptions to WFL(limited ROM due to precautions) Thoracic Assessment Thoracic Assessment: Exceptions to WFL(rounded shoulders) Lumbar Assessment Lumbar Assessment: Exceptions to WFL(posterior pelvic tilt) Postural Control Postural Control: Deficits on evaluation Trunk Control: impaired Righting Reactions: delayed  Balance Balance Balance Assessed: Yes Static Sitting Balance Static Sitting - Balance Support: Feet supported;Bilateral upper extremity supported Static Sitting - Level of Assistance: 5: Stand by assistance;4: Min assist Dynamic Sitting Balance Dynamic Sitting - Balance Support: Feet supported;No upper extremity supported;During functional activity  Dynamic Sitting - Level of Assistance: 4: Min assist;3: Mod assist Extremity Assessment   RLE Assessment RLE Assessment: Exceptions to [facility] RLE Strength Right Hip Flexion: 1/5 Right Knee Flexion: 0/5 Right Knee Extension: 0/5 Right Ankle Dorsiflexion: 2-/5 LLE Assessment LLE Assessment: Exceptions to [facility] LLE Strength Left Hip Flexion: 1/5 Left Knee Flexion: 1/5 Left Knee Extension: 1/5 Left Ankle Dorsiflexion: 2-/5    Refer to Care Plan for Long Term Goals  Recommendations for other services: Neuropsych  Discharge Criteria: Patient will be discharged from PT if patient refuses treatment 3 consecutive times without medical reason, if treatment goals not met, if there is a change in medical status, if patient makes no progress towards goals or if patient is discharged from hospital.  The above assessment, treatment plan, treatment alternatives and goals were discussed and mutually agreed upon: by patient  Excell Seltzer, PT, DPT  08/20/2018, 10:56 AM

## 2018-08-20 NOTE — Progress Notes (Signed)
Occupational Therapy Session Note  Patient Details  Name: Brandon Collins MRN: [id number] Date of Birth: 01/24/1951  Today's Date: 08/20/2018 OT Individual Time: [id number] OT Individual Time Calculation (min): 45 min  and Today's Date: 08/20/2018 OT Missed Time: 15 Minutes Missed Time Reason: Patient fatigue   Short Term Goals: Week 1:  OT Short Term Goal 1 (Week 1): Pt will roll R and L with max +1 to reduce caregiver burden with LB dressing OT Short Term Goal 2 (Week 1): Pt will don shirt with (S) sitting EOB OT Short Term Goal 3 (Week 1): Pt will feed himself entire meal with LRD with CGA OT Short Term Goal 4 (Week 1): Pt will use STEDY with max +2 to transfer to w/c   Skilled Therapeutic Interventions/Progress Updates:  Upon entering the room, pt supine in bed with wife present in room. Pt reports feeling "okay" but very tired. Pt is agreeable to sitting on EOB. Pt needing increased time and verbal directions secondary to anxiety with movement. Supine >sit with total A of 1 to EOB. Static sitting balance 12 minutes with min A for sitting balance. Pt needing verbal cuing and demonstration for pursed lip breathing secondary to anxiety with sitting on EOB. Pt with strong fear of falling. Pt able to wiggle toes, kick B LEs towards therapist, and demonstrate ankle pumps on commands but would state, " I can't do that". OT attempting to encourage pt with these activities. Pt requesting to return to supine secondary to fatigue with total A of 1. Pt given yellow resistive theraputty and pt able to perform 2 exercise with both hands before requesting to rest. Education to continued. Bed alarm activated and wife remaining in room.  Therapy Documentation Precautions:  Precautions Precautions: Cervical, Fall Precaution Comments: Reviewed cervical precautions Required Braces or Orthoses: Cervical Brace Cervical Brace: Hard collar, At all times Restrictions Weight Bearing Restrictions:  No General: General Chart Reviewed: Yes OT Amount of Missed Time: 15 Minutes Family/Caregiver Present: No Vital Signs: Therapy Vitals Temp: (!) 97.4 F (36.3 C) Pulse Rate: 93 BP: 124/71 Patient Position (if appropriate): Lying Oxygen Therapy SpO2: 95 % O2 Device: Room Air Pain: Pain Assessment Pain Scale: 0-10 Pain Score: 9  Pain Type: Acute pain Pain Location: Leg Pain Orientation: Left;Right Pain Descriptors / Indicators: Sharp Pain Onset: With Activity Pain Intervention(s): Emotional support;Relaxation;RN made aware;Repositioned ADL:   Vision Baseline Vision/History: Wears glasses Patient Visual Report: No change from baseline Vision Assessment?: No apparent visual deficits Perception  Perception: Within Functional Limits Praxis Praxis: Intact   Therapy/Group: Individual Therapy  [name], [name] P 08/20/2018, 4:03 PM

## 2018-08-21 ENCOUNTER — Inpatient Hospital Stay (HOSPITAL_COMMUNITY): Payer: Managed Care, Other (non HMO)

## 2018-08-21 ENCOUNTER — Inpatient Hospital Stay (HOSPITAL_COMMUNITY): Payer: Managed Care, Other (non HMO) | Admitting: Physical Therapy

## 2018-08-21 ENCOUNTER — Inpatient Hospital Stay (HOSPITAL_COMMUNITY): Payer: Managed Care, Other (non HMO) | Admitting: Occupational Therapy

## 2018-08-21 ENCOUNTER — Inpatient Hospital Stay (HOSPITAL_COMMUNITY): Payer: Managed Care, Other (non HMO) | Admitting: Speech Pathology

## 2018-08-21 DIAGNOSIS — K9189 Other postprocedural complications and disorders of digestive system: Secondary | ICD-10-CM

## 2018-08-21 DIAGNOSIS — K567 Ileus, unspecified: Secondary | ICD-10-CM

## 2018-08-21 LAB — CBC
HCT: 39.8 % (ref 39.0–52.0)
HCT: 40.4 % (ref 39.0–52.0)
Hemoglobin: 13.3 g/dL (ref 13.0–17.0)
Hemoglobin: 13.4 g/dL (ref 13.0–17.0)
MCH: 28.7 pg (ref 26.0–34.0)
MCH: 28.5 pg (ref 26.0–34.0)
MCHC: 33.4 g/dL (ref 30.0–36.0)
MCHC: 33.2 g/dL (ref 30.0–36.0)
MCV: 85.8 fL (ref 80.0–100.0)
MCV: 85.8 fL (ref 80.0–100.0)
Platelets: 405 10*3/uL — ABNORMAL HIGH (ref 150–400)
Platelets: 434 10*3/uL — ABNORMAL HIGH (ref 150–400)
RBC: 4.64 MIL/uL (ref 4.22–5.81)
RBC: 4.71 MIL/uL (ref 4.22–5.81)
RDW: 12.7 % (ref 11.5–15.5)
RDW: 12.7 % (ref 11.5–15.5)
WBC: 17 10*3/uL — ABNORMAL HIGH (ref 4.0–10.5)
WBC: 15.7 10*3/uL — ABNORMAL HIGH (ref 4.0–10.5)
nRBC: 0 % (ref 0.0–0.2)
nRBC: 0 % (ref 0.0–0.2)

## 2018-08-21 LAB — GLUCOSE, CAPILLARY
Glucose-Capillary: 231 mg/dL — ABNORMAL HIGH (ref 70–99)
Glucose-Capillary: 197 mg/dL — ABNORMAL HIGH (ref 70–99)
Glucose-Capillary: 141 mg/dL — ABNORMAL HIGH (ref 70–99)
Glucose-Capillary: 151 mg/dL — ABNORMAL HIGH (ref 70–99)

## 2018-08-21 LAB — BASIC METABOLIC PANEL
Anion gap: 18 — ABNORMAL HIGH (ref 5–15)
Anion gap: 19 — ABNORMAL HIGH (ref 5–15)
BUN: 73 mg/dL — ABNORMAL HIGH (ref 8–23)
BUN: 81 mg/dL — ABNORMAL HIGH (ref 8–23)
CO2: 19 mmol/L — ABNORMAL LOW (ref 22–32)
CO2: 19 mmol/L — ABNORMAL LOW (ref 22–32)
Calcium: 8.5 mg/dL — ABNORMAL LOW (ref 8.9–10.3)
Calcium: 8.3 mg/dL — ABNORMAL LOW (ref 8.9–10.3)
Chloride: 90 mmol/L — ABNORMAL LOW (ref 98–111)
Chloride: 91 mmol/L — ABNORMAL LOW (ref 98–111)
Creatinine, Ser: 1.86 mg/dL — ABNORMAL HIGH (ref 0.61–1.24)
Creatinine, Ser: 2.12 mg/dL — ABNORMAL HIGH (ref 0.61–1.24)
GFR calc Af Amer: 42 mL/min — ABNORMAL LOW (ref >60)
GFR calc Af Amer: 36 mL/min — ABNORMAL LOW (ref >60)
GFR calc non Af Amer: 37 mL/min — ABNORMAL LOW (ref >60)
GFR calc non Af Amer: 31 mL/min — ABNORMAL LOW (ref >60)
Glucose, Bld: 239 mg/dL — ABNORMAL HIGH (ref 70–99)
Glucose, Bld: 171 mg/dL — ABNORMAL HIGH (ref 70–99)
Potassium: 4.1 mmol/L (ref 3.5–5.1)
Potassium: 4.5 mmol/L (ref 3.5–5.1)
Sodium: 127 mmol/L — ABNORMAL LOW (ref 135–145)
Sodium: 129 mmol/L — ABNORMAL LOW (ref 135–145)

## 2018-08-21 MED ORDER — LIDOCAINE VISCOUS HCL 2 % MT SOLN
OROMUCOSAL | Status: AC
  Administered 2018-08-21: 5 mL via OROMUCOSAL
  Filled 2018-08-21: qty 15

## 2018-08-21 MED ORDER — LIDOCAINE VISCOUS HCL 2 % MT SOLN
15.0000 mL | Freq: Once | OROMUCOSAL | Status: AC
  Administered 2018-08-21: 5 mL via OROMUCOSAL
  Filled 2018-08-21: qty 15

## 2018-08-21 MED ORDER — POTASSIUM CHLORIDE IN NACL 20-0.9 MEQ/L-% IV SOLN
INTRAVENOUS | Status: DC
  Administered 2018-08-21 – 2018-08-23 (×5): via INTRAVENOUS
  Filled 2018-08-21 (×6): qty 1000

## 2018-08-21 NOTE — Progress Notes (Signed)
Spoke with PA about patients bowel this am. Soap suds enema ordered. Water returned, No stool. STAT KUB ordered. Awaiting results for further orders. Pt on liquid diet. Will continue to monitor.

## 2018-08-21 NOTE — Progress Notes (Signed)
Stat KUB returned dilated loops of small and large bowel consistent with ileus. Will make patient nothing by mouth and place nasogastric tube. Repeat soapsuds enema. Will discuss all results and plan of care with wife and patient. Plan to repeat labs this afternoon as well as repeat KUB in a.m.

## 2018-08-21 NOTE — Progress Notes (Signed)
Pt returned from successful NG tube placement. Pt educated on importance of NG tube. Pt complained that it was uncomfortable. Wife at bedside. Mittens ordered via lawson. 1830- Pt's wife called to room and pt had pulled NG tube out. Pt stated " it was driving me crazy" RN contacted PA on call and was given orders to reinsert NG tube and place wrist restraints. New NG tube ordered and this information was passed on to Night shift RN.

## 2018-08-21 NOTE — Progress Notes (Signed)
RN attempted to place NG tube x2 but was unsuccessful. Pt pulling at tube and pt unable to tolerate placement at bedside. RN notified PA and obtained order for NG to be placed via Radiology. Pt has had very little urine output today so order was obtained to scan patients bladder. Bladder scan shows 422. Pt attempted to void with no success. Order obtained for I &O cath. Urine is tea colored. Will continue to monitor.

## 2018-08-21 NOTE — Progress Notes (Signed)
Occupational Therapy Session Note  Patient Details  Name: Brandon Collins MRN: [id number] Date of Birth: 13-Mar-1951  Today's Date: 08/21/2018 OT Missed Time: 60 Minutes Missed Time Reason: MD hold (comment)   Short Term Goals: Week 1:  OT Short Term Goal 1 (Week 1): Pt will roll R and L with max +1 to reduce caregiver burden with LB dressing OT Short Term Goal 2 (Week 1): Pt will don shirt with (S) sitting EOB OT Short Term Goal 3 (Week 1): Pt will feed himself entire meal with LRD with CGA OT Short Term Goal 4 (Week 1): Pt will use STEDY with max +2 to transfer to w/c   Skilled Therapeutic Interventions/Progress Updates:    Pt is now NPO and the PA stated to hold therapies for now until further testing.  Therapy Documentation Precautions:  Precautions Precautions: Cervical, Fall Precaution Comments: Reviewed cervical precautions Required Braces or Orthoses: Cervical Brace Cervical Brace: Hard collar, At all times Restrictions Weight Bearing Restrictions: No  Pain: " I hurt every where"     Therapy/Group: Individual Therapy  Avynn Klassen 08/21/2018, 12:04 PM

## 2018-08-21 NOTE — Progress Notes (Signed)
Physical Therapy Session Note  Patient Details  Name: Brandon Collins MRN: [id number] Date of Birth: 1951/06/04  Today's Date: 08/21/2018   Short Term Goals: Week 1:  PT Short Term Goal 1 (Week 1): Pt will complete bed mobility with max A x 1 consistently PT Short Term Goal 2 (Week 1): Pt will maintain sitting balance with Supervision PT Short Term Goal 3 (Week 1): Pt will tolerate sitting OOB x 1 hour  Skilled Therapeutic Interventions/Progress Updates:      PT attempted to see pt for treatment. Per RN, PA as placed verbal order to hold therapy at this time. PT will continue to follow and treat as medically appropriate    Therapy Documentation Precautions:  Precautions Precautions: Cervical, Fall Precaution Comments: Reviewed cervical precautions Required Braces or Orthoses: Cervical Brace Cervical Brace: Hard collar, At all times Restrictions Weight Bearing Restrictions: No General: PT Amount of Missed Time (min): 30 Minutes PT Missed Treatment Reason: MD hold (Comment) Vital Signs: Therapy Vitals Temp: (!) 97.5 F (36.4 C) Temp Source: Oral Pulse Rate: 82 Resp: 17 BP: 95/65 Patient Position (if appropriate): Lying Oxygen Therapy SpO2: 95 % O2 Device: Room Air  Therapy/Group: Individual Therapy  Golden Popustin E Alexandra Lipps 08/21/2018, 2:54 PM

## 2018-08-21 NOTE — Progress Notes (Signed)
Physical Therapy Session Note  Patient Details  Name: Brandon Collins MRN: [id number] Date of Birth: 01/23/1951  Today's Date: 08/21/2018 PT Individual Time: 0900-1000 PT Individual Time Calculation (min): 60 min   Short Term Goals: Week 1:  PT Short Term Goal 1 (Week 1): Pt will complete bed mobility with max A x 1 consistently PT Short Term Goal 2 (Week 1): Pt will maintain sitting balance with Supervision PT Short Term Goal 3 (Week 1): Pt will tolerate sitting OOB x 1 hour  Skilled Therapeutic Interventions/Progress Updates:    Pt received seated in bed, agreeable to PT. No complaints of pain at rest, has onset of B hip, knee, and ankle pain with mobility due to OA. Supine to sit with assist x 2. Min A to maintain sitting balance EOB with B UE/LE supported, tends to lean posteriorly. Sliding board transfer bed to w/c with assist x 2, max cueing for body positioning and safety. Manual w/c propulsion 2 x 25 ft with BUE before onset of fatigue. Attempt sit to stand in standing frame, pt has increase in BLE pain and does not tolerate standing frame. Sliding board transfer w/c to recliner with assist x 2. Pt left seated in recliner with needs in reach, quick release belt and chair alarm in place.  Therapy Documentation Precautions:  Precautions Precautions: Cervical, Fall Precaution Comments: Reviewed cervical precautions Required Braces or Orthoses: Cervical Brace Cervical Brace: Hard collar, At all times Restrictions Weight Bearing Restrictions: No   Therapy/Group: Individual Therapy  Peter Congoaylor Milus Fritze, PT, DPT  08/21/2018, 10:57 AM

## 2018-08-21 NOTE — Progress Notes (Signed)
18 fr NG tube placed Right nare pt tolerated well. Xray ordered for placement

## 2018-08-21 NOTE — Progress Notes (Signed)
[place] PHYSICAL MEDICINE & REHABILITATION PROGRESS NOTE   Subjective/Complaints: Having a lot of gas, reflux. Uncomfortable this last night into this morning. Affecting his participation with therapies.   ROS: Patient denies fever, rash, sore throat, blurred vision,  diarrhea, cough, shortness of breath or chest pain,   headache, or mood change.   Objective:   Dg Chest 2 View  Result Date: 08/20/2018 CLINICAL DATA:  Aspiration into airway. EXAM: CHEST - 2 VIEW COMPARISON:  None. FINDINGS: Low lung volumes with bibasilar opacities which could reflect atelectasis or infiltrates/pneumonia. No effusions. Heart is borderline, accentuated by the AP nature of the study and low volumes. IMPRESSION: Low lung volumes.  Bibasilar atelectasis or infiltrates. Electronically Signed   By: Charlett Nose M.D.   On: 08/20/2018 21:25   Dg Ankle 2 Views Right  Result Date: 08/20/2018 CLINICAL DATA:  67 year old male with a history decubitus ulcer EXAM: RIGHT ANKLE - 2 VIEW COMPARISON:  None. FINDINGS: No acute displaced fracture. Degenerative changes at the hindfoot and ankle. Enthesopathic changes at the plantar fascia insertion and Achilles insertion. Calcifications of the pedal and tibial arteries. IMPRESSION: No acute bony abnormality. Degenerative changes of the hindfoot. Arterial calcifications of tibial and pedal arteries, which can be seen in the setting diabetes. If there is concern for arterial compromise contributing to the wound, correlation with noninvasive arterial testing/ABI may be useful. Electronically Signed   By: Gilmer Mor D.O.   On: 08/20/2018 12:22   Dg Foot 2 Views Right  Result Date: 08/20/2018 CLINICAL DATA:  ? Bed sore lateral aspect right ankle/proximal foot EXAM: RIGHT FOOT - 2 VIEW COMPARISON:  None. FINDINGS: Osseous alignment is normal. No acute or suspicious osseous finding. No focal demineralization or destructive changes to suggest osteomyelitis. Bandages overlie the lateral  aspects of the proximal foot. No evidence of soft tissue gas seen incidental note made osseous spurring at the plantar margin of the posterior calcaneus. IMPRESSION: No acute findings. No evidence of osteomyelitis. Electronically Signed   By: Bary Richard M.D.   On: 08/20/2018 12:22   Vas [place] Lower Extremity Venous (dvt)  Result Date: 08/20/2018  Lower Venous Study Indications: Edema, and Swelling.  Limitations: Patient positioning. Performing Technologist: Blanch Media RVS  Examination Guidelines: A complete evaluation includes B-mode imaging, spectral Doppler, color Doppler, and power Doppler as needed of all accessible portions of each vessel. Bilateral testing is considered an integral part of a complete examination. Limited examinations for reoccurring indications may be performed as noted.  Right Venous Findings: +---------+---------------+---------+-----------+----------+-------+          CompressibilityPhasicitySpontaneityPropertiesSummary +---------+---------------+---------+-----------+----------+-------+ CFV      Full           Yes      Yes                          +---------+---------------+---------+-----------+----------+-------+ SFJ      Full                                                 +---------+---------------+---------+-----------+----------+-------+ FV Prox  Full                                                 +---------+---------------+---------+-----------+----------+-------+  FV Mid   Full                                                 +---------+---------------+---------+-----------+----------+-------+ FV DistalFull                                                 +---------+---------------+---------+-----------+----------+-------+ PFV      Full                                                 +---------+---------------+---------+-----------+----------+-------+ POP      Full           Yes      Yes                           +---------+---------------+---------+-----------+----------+-------+ PTV      Full                                                 +---------+---------------+---------+-----------+----------+-------+ PERO     Full                                                 +---------+---------------+---------+-----------+----------+-------+  Left Venous Findings: +---------+---------------+---------+-----------+----------+-------+          CompressibilityPhasicitySpontaneityPropertiesSummary +---------+---------------+---------+-----------+----------+-------+ CFV      Full           Yes      Yes                          +---------+---------------+---------+-----------+----------+-------+ SFJ      Full                                                 +---------+---------------+---------+-----------+----------+-------+ FV Prox  Full                                                 +---------+---------------+---------+-----------+----------+-------+ FV Mid   Full                                                 +---------+---------------+---------+-----------+----------+-------+ FV DistalFull                                                 +---------+---------------+---------+-----------+----------+-------+  PFV      Full                                                 +---------+---------------+---------+-----------+----------+-------+ POP      Full           Yes      Yes                          +---------+---------------+---------+-----------+----------+-------+ PTV      Full                                                 +---------+---------------+---------+-----------+----------+-------+ PERO     Full                                                 +---------+---------------+---------+-----------+----------+-------+    Summary: Right: There is no evidence of deep vein thrombosis in the lower extremity. No cystic structure found in the popliteal  fossa. Left: There is no evidence of deep vein thrombosis in the lower extremity. No cystic structure found in the popliteal fossa.  *See table(s) above for measurements and observations. Electronically signed by Nanetta Batty MD on 08/20/2018 at 4:57:01 PM.    Final    Recent Labs    08/20/18 0607 08/21/18 0650  WBC 15.8* 17.0*  HGB 14.2 13.3  HCT 43.9 39.8  PLT 325 405*   Recent Labs    08/20/18 0607 08/21/18 0650  NA 132* 127*  K 4.0 4.1  CL 92* 90*  CO2 21* 19*  GLUCOSE 107* 239*  BUN 41* 73*  CREATININE 1.40* 1.86*  CALCIUM 8.8* 8.5*    Intake/Output Summary (Last 24 hours) at 08/21/2018 1032 Last data filed at 08/21/2018 0650 Gross per 24 hour  Intake 444 ml  Output 300 ml  Net 144 ml     Physical Exam: Vital Signs Blood pressure 130/73, pulse (!) 102, temperature 97.7 F (36.5 C), resp. rate 17, height 6' (1.829 m), weight 103.3 kg, SpO2 95 %.  Constitutional: appears uncomfortable. Vital signs reviewed. HEENT: EOMI, oral membranes moist Neck: supple Cardiovascular: RRR without murmur. No JVD    Respiratory: CTA Bilaterally without wheezes or rales. Normal effort    GI: sluggish bowel sounds, abdomen sl distended.   Musculoskeletal:  Lower extremity edema R>L. Right ankle/foot tender still to palpation Neurological: He is alert and oriented to person, place, and time.  Motor: Right upper extremity: 4+/5 proximal distal Left upper extremity: 5/5 proximal distal Bilateral lower extremities: R:LE: 1/5 Hip flexion,1/5  knee extension 0/5, ankle dorsiflexion, APF 1-2/5, pain related also. LLE: 2-3/5 prox to distal. --stable exam Sensation intact light touch  Skin: Skin is warm and dry.  Surgical site remains clean and dry  Psychiatric: flat   Assessment/Plan: 1. Functional deficits secondary to cervical myelopathy which require 3+ hours per day of interdisciplinary therapy in a comprehensive inpatient rehab setting.  Physiatrist is providing close team  supervision and 24 hour management of active medical problems listed below.  Physiatrist and rehab team continue to  assess barriers to discharge/monitor patient progress toward functional and medical goals  Care Tool:  Bathing    Body parts bathed by patient: Chest, Abdomen, Front perineal area, Face   Body parts bathed by helper: Right arm, Left arm, Buttocks, Right upper leg, Left upper leg, Right lower leg, Left lower leg     Bathing assist Assist Level: Maximal Assistance - Patient 24 - 49%     Upper Body Dressing/Undressing Upper body dressing   What is the patient wearing?: Pull over shirt    Upper body assist Assist Level: Minimal Assistance - Patient > 75%    Lower Body Dressing/Undressing Lower body dressing      What is the patient wearing?: Pants     Lower body assist Assist for lower body dressing: Moderate Assistance - Patient 50 - 74%     Toileting Toileting    Toileting assist Assist for toileting: 2 Helpers     Transfers Chair/bed transfer  Transfers assist  Chair/bed transfer activity did not occur: Safety/medical concerns        Locomotion Ambulation   Ambulation assist   Ambulation activity did not occur: Safety/medical concerns          Walk 10 feet activity   Assist  Walk 10 feet activity did not occur: Safety/medical concerns        Walk 50 feet activity   Assist Walk 50 feet with 2 turns activity did not occur: Safety/medical concerns         Walk 150 feet activity   Assist Walk 150 feet activity did not occur: Safety/medical concerns         Walk 10 feet on uneven surface  activity   Assist Walk 10 feet on uneven surfaces activity did not occur: Safety/medical concerns         Wheelchair     Assist Will patient use wheelchair at discharge?: Yes Type of Wheelchair: (TBD) Wheelchair activity did not occur: Safety/medical concerns         Wheelchair 50 feet with 2 turns  activity    Assist    Wheelchair 50 feet with 2 turns activity did not occur: Safety/medical concerns       Wheelchair 150 feet activity     Assist Wheelchair 150 feet activity did not occur: Safety/medical concerns         Medical Problem List and Plan: 1.  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 Cervical collar at all times  --Continue CIR therapies including PT, OT  2.  DVT Prophylaxis/Anticoagulation: SCDs. Vascular study negative  -recommend lovenox 30mg  q12   3. Pain Management:  Oxycodone and Valium as needed.  -right ankle pain/swelling. Pt reports "twisting" ankle  -will check xrays right ankle 4. Mood:  Provide emotional support 5. Neuropsych: This patient is capable of making decisions on his own behalf. 6. Skin/Wound Care:  Routine skin checks 7. Fluids/Electrolytes/Nutrition:    -BUN/Cr increased, sodium down to 127  -begin NS IVF 100cc/hr 8. Diabetes mellitus. Glucophage 1500 mg daily, Actos 15 mg daily, Amaryl 8 mg daily.  -sugars well controlled 12/5 9. Hypertension. Lisinopril 40 mg daily, clonidine 0.1 mg twice a day, Norvasc 10 mg daily. Monitor with increased mobility 10. Hyperlipidemia. Lipitor 11. Constipation. Laxative assistance 12. Leukocytosis:  Up to 17k today,  no clinical signs of infection    -urine remains clear without odor   -cxr with ?atelectasis   -might be reactive to potential ileus.   -observe  for now, recheck cbc in am 13. Abdominal distention:  -concerning for ileus  -SSE now  -bedside KUB  -IVF as above  -liquid diet   LOS: 2 days A FACE TO FACE EVALUATION WAS PERFORMED  Ranelle OysterZachary T Charlis Harner 08/21/2018, 10:32 AM

## 2018-08-21 NOTE — Evaluation (Signed)
Speech Language Pathology Assessment and Plan  Patient Details  Name: Brandon Collins MRN: [id number] Date of Birth: [birth year]  SLP Diagnosis: Dysphagia  Rehab Potential: Good ELOS: 1-2 additional sessions, pending work up for ileus    Today's Date: 08/21/2018 SLP Individual Time: 1120-1130 SLP Individual Time Calculation (min): 10 min   Problem List:  Patient Active Problem List   Diagnosis Date Noted  . Cervical myelopathy ([place]) 08/19/2018  . Cervical cord compression with myelopathy ([place])   . Postoperative pain   . Diabetes mellitus type 2 in nonobese ([facility])   . Essential hypertension   . Dyslipidemia   . Drug induced constipation   . Myelopathy ([place]) 08/12/2018   Past Medical History:  Past Medical History:  Diagnosis Date  . Arthritis    "all over" (08/12/2018)  . High cholesterol   . Hypertension   . Type II diabetes mellitus ([place])    Past Surgical History:  Past Surgical History:  Procedure Laterality Date  . ANTERIOR CERVICAL DECOMP/DISCECTOMY FUSION  08/12/2018   CERVICAL 3-4, CERVICAL 4-5, CERVICAL 5-6, CERVICAL 6-7 WITH INSTRUMENTATION AND ALLOGRAFT  . ANTERIOR CERVICAL DECOMPRESSION/DISCECTOMY FUSION 4 LEVELS N/A 08/12/2018   Procedure: ANTERIOR CERVICAL DECOMPRESSION FUSION CERVICAL 3-4, CERVICAL 4-5, CERVICAL 5-6, CERVICAL 6-7 WITH INSTRUMENTATION AND ALLOGRAFT;  Surgeon: Phylliss Bob, MD;  Location: [facility];  Service: Orthopedics;  Laterality: N/A;  ANTERIOR CERVICAL DECOMPRESSION FUSION CERVICAL 3-4, CERVICAL 4-5, CERVICAL 5-6, CERVICAL 6-7 WITH INSTRUMENTATION AND ALLOGRAFT  . BACK SURGERY    . [place]   "? side"  . KNEE ARTHROSCOPY Right   . PILONIDAL CYST EXCISION      Assessment / Plan / Recommendation Clinical Impression   Brandon Collins is a 67 year old right-handed male history of hypertension, type 2 diabetes mellitus, cervical myelopathy with cord compression and myelomalacia C3-C7 with progressive decline for the past 6  months. Per chart review and patient, patient lives with spouse. One level home 3 steps to entry. Independent working up until 2 weeks ago.presented 08/12/2018 with new onset of bilateral upper extremity weakness, left lower extremity weakness and balance deficits with noted falls as well as difficulty with basic ADLs.underwent anterior cervical decompression and fusion C3-4, 4-5, 5-6 and C6-7 as well as placement of anterior instrumentation per Dr. Lynann Bologna. Hospital course pain management. Cervical collar at all times. Therapy evaluations completed with recommendations of physical medicine rehabilitation consult. Patient was admitted for a comprehensive rehabilitation program. Bedside swallow evaluation was ordered on 08/20/2018 and completed on 08/21/2018 with the following results:  Evaluation was limited as pt was changed to full liquids diet at the time of assessment while being worked up for ileus.  Pt had initial throat clearing with sip of thin liquids which subsided with consecutive sips.  Pt had explosive belching following each sip of thin liquids which could be indicative of esophageal involvement in the setting of acute GI dysfunction.  Pt appeared appropriate for a full liquids diet at the time of today's evaluation; however, since evaluation was completed, KUB findings have been gathered which confirm ileus and pt is now NPO with NG tube.  Will follow up for 1-2 additional sessions once pt is cleared to resume POs   Skilled Therapeutic Interventions          Bedside swallow evaluation completed with results and recommendations reviewed with patient.     SLP Assessment  Patient will need skilled Speech Lanaguage Pathology Services during CIR admission    Recommendations  SLP Diet Recommendations: Thin Liquid Administration via: Cup Medication Administration: Other (Comment)(at nursing's discretion) Supervision: Patient able to self feed Compensations: Small sips/bites Postural Changes  and/or Swallow Maneuvers: Out of bed for meals;Seated upright 90 degrees;Upright 30-60 min after meal Oral Care Recommendations: Oral care BID Patient destination: Home Follow up Recommendations: None Equipment Recommended: None recommended by SLP    SLP Frequency 1 to 3 out of 7 days   SLP Duration  SLP Intensity  SLP Treatment/Interventions 1-2 additional sessions, pending work up for ileus  Minumum of 1-2 x/day, 30 to 90 minutes  Dysphagia/aspiration precaution training    Pain Pain Assessment Pain Scale: Faces Faces Pain Scale: Hurts even more Pain Type: Acute pain Pain Location: Abdomen Pain Descriptors / Indicators: Aching Pain Intervention(s): Repositioned  Prior Functioning Cognitive/Linguistic Baseline: Within functional limits Type of Home: House  Lives With: Spouse Available Help at Discharge: Family;Available PRN/intermittently Vocation: Full time employment  Short Term Goals: Week 1: SLP Short Term Goal 1 (Week 1): STG=LTG due to anticipated short length of stay for ST  Refer to Care Plan for Long Term Goals  Recommendations for other services: None   Discharge Criteria: Patient will be discharged from SLP if patient refuses treatment 3 consecutive times without medical reason, if treatment goals not met, if there is a change in medical status, if patient makes no progress towards goals or if patient is discharged from hospital.  The above assessment, treatment plan, treatment alternatives and goals were discussed and mutually agreed upon: by patient  Emilio Math 08/21/2018, 4:20 PM

## 2018-08-22 ENCOUNTER — Inpatient Hospital Stay (HOSPITAL_COMMUNITY): Payer: Managed Care, Other (non HMO) | Admitting: Occupational Therapy

## 2018-08-22 ENCOUNTER — Inpatient Hospital Stay (HOSPITAL_COMMUNITY): Payer: Managed Care, Other (non HMO) | Admitting: Speech Pathology

## 2018-08-22 ENCOUNTER — Inpatient Hospital Stay (HOSPITAL_COMMUNITY): Payer: Managed Care, Other (non HMO)

## 2018-08-22 ENCOUNTER — Inpatient Hospital Stay (HOSPITAL_COMMUNITY): Payer: Managed Care, Other (non HMO) | Admitting: Physical Therapy

## 2018-08-22 LAB — BASIC METABOLIC PANEL
Anion gap: 16 — ABNORMAL HIGH (ref 5–15)
Anion gap: 15 (ref 5–15)
BUN: 96 mg/dL — ABNORMAL HIGH (ref 8–23)
BUN: 99 mg/dL — ABNORMAL HIGH (ref 8–23)
CO2: 19 mmol/L — ABNORMAL LOW (ref 22–32)
CO2: 19 mmol/L — ABNORMAL LOW (ref 22–32)
Calcium: 8.2 mg/dL — ABNORMAL LOW (ref 8.9–10.3)
Calcium: 8.3 mg/dL — ABNORMAL LOW (ref 8.9–10.3)
Chloride: 95 mmol/L — ABNORMAL LOW (ref 98–111)
Chloride: 97 mmol/L — ABNORMAL LOW (ref 98–111)
Creatinine, Ser: 2.09 mg/dL — ABNORMAL HIGH (ref 0.61–1.24)
Creatinine, Ser: 1.79 mg/dL — ABNORMAL HIGH (ref 0.61–1.24)
GFR calc Af Amer: 37 mL/min — ABNORMAL LOW (ref >60)
GFR calc Af Amer: 44 mL/min — ABNORMAL LOW (ref >60)
GFR calc non Af Amer: 32 mL/min — ABNORMAL LOW (ref >60)
GFR calc non Af Amer: 38 mL/min — ABNORMAL LOW (ref >60)
Glucose, Bld: 163 mg/dL — ABNORMAL HIGH (ref 70–99)
Glucose, Bld: 86 mg/dL (ref 70–99)
Potassium: 4.4 mmol/L (ref 3.5–5.1)
Potassium: 4.6 mmol/L (ref 3.5–5.1)
Sodium: 130 mmol/L — ABNORMAL LOW (ref 135–145)
Sodium: 131 mmol/L — ABNORMAL LOW (ref 135–145)

## 2018-08-22 LAB — MAGNESIUM: Magnesium: 2.3 mg/dL (ref 1.7–2.4)

## 2018-08-22 LAB — GLUCOSE, CAPILLARY
Glucose-Capillary: 133 mg/dL — ABNORMAL HIGH (ref 70–99)
Glucose-Capillary: 103 mg/dL — ABNORMAL HIGH (ref 70–99)
Glucose-Capillary: 72 mg/dL (ref 70–99)
Glucose-Capillary: 56 mg/dL — ABNORMAL LOW (ref 70–99)
Glucose-Capillary: 80 mg/dL (ref 70–99)

## 2018-08-22 MED ORDER — DEXTROSE 50 % IV SOLN
12.5000 g | INTRAVENOUS | Status: AC
  Administered 2018-08-22: 12.5 g via INTRAVENOUS

## 2018-08-22 MED ORDER — METOCLOPRAMIDE HCL 5 MG/ML IJ SOLN
10.0000 mg | Freq: Four times a day (QID) | INTRAMUSCULAR | Status: DC
  Administered 2018-08-22 – 2018-08-29 (×29): 10 mg via INTRAVENOUS
  Filled 2018-08-22 (×30): qty 2

## 2018-08-22 MED ORDER — DEXTROSE 50 % IV SOLN
INTRAVENOUS | Status: AC
  Administered 2018-08-22: 12.5 g via INTRAVENOUS
  Filled 2018-08-22: qty 50

## 2018-08-22 MED ORDER — PNEUMOCOCCAL VAC POLYVALENT 25 MCG/0.5ML IJ INJ
0.5000 mL | INJECTION | INTRAMUSCULAR | Status: AC
  Administered 2018-08-23: 0.5 mL via INTRAMUSCULAR
  Filled 2018-08-22: qty 0.5

## 2018-08-22 MED ORDER — SODIUM CHLORIDE 0.9% FLUSH
10.0000 mL | INTRAVENOUS | Status: DC | PRN
  Administered 2018-08-25: 10 mL
  Filled 2018-08-22: qty 40

## 2018-08-22 NOTE — IPOC Note (Signed)
Overall Plan of Care [facility](IPOC) Patient Details Name: Brandon Collins MRN: [id number] DOB: Oct 26, 1950  Admitting Diagnosis: <principal problem not specified>  Hospital Problems: Active Problems:   Myelopathy (HCC)   Cervical myelopathy (HCC)     Functional Problem List: Nursing Endurance, Motor, Nutrition, Pain, Safety, Skin Integrity  PT Balance, Endurance, Motor, Pain, Safety, Sensory  OT Balance, Pain, Perception, Cognition, Safety, Sensory, Motor, Skin Integrity, Endurance, Edema  SLP Nutrition  TR         Basic ADL's: OT Eating, Grooming, Bathing, Dressing, Toileting     Advanced  ADL's: OT       Transfers: PT Bed Mobility, Bed to Chair, Car, State Street CorporationFurniture, Civil Service fast [place]  OT Toilet, Research scientist (life sciences)Tub/Shower     Locomotion: PT Ambulation, Psychologist, prison and probation servicesWheelchair Mobility, Stairs     Additional Impairments: OT None  SLP Swallowing      TR      Anticipated Outcomes Item Anticipated Outcome  Self Feeding set up  Swallowing  mod I   Basic self-care  min A  Toileting  min A   Bathroom Transfers min A  Bowel/Bladder  Patient will continue to be continent of bowel and bladder during admission  Transfers  Min A  Locomotion  Min A at w/c level  Communication     Cognition     Pain  Patient will be pain free or pain less than 3 during admission  Safety/Judgment  Patient will be free from falls and adhere to safety plan   Therapy Plan: PT Intensity: Minimum of 1-2 x/day ,45 to 90 minutes PT Frequency: 5 out of 7 days PT Duration Estimated Length of Stay: 21-24 OT Intensity: Minimum of 1-2 x/day, 45 to 90 minutes OT Frequency: 5 out of 7 days OT Duration/Estimated Length of Stay: 21-25 days SLP Intensity: Minumum of 1-2 x/day, 30 to 90 minutes SLP Frequency: 1 to 3 out of 7 days SLP Duration/Estimated Length of Stay: 1-2 additional sessions, pending work up for ileus    Team Interventions: Nursing Interventions Pain Management, Skin Care/Wound Management, Dysphagia/Aspiration Precaution  Training  PT interventions Warden/rangerBalance/vestibular training, Community reintegration, Discharge planning, Disease management/prevention, DME/adaptive equipment instruction, Functional electrical stimulation, Functional mobility training, Neuromuscular re-education, Pain management, Patient/family education, Psychosocial support, Skin care/wound management, Stair training, Therapeutic Activities, Therapeutic Exercise, UE/LE Strength taining/ROM, UE/LE Coordination activities, Wheelchair propulsion/positioning, Ambulation/gait training  OT Interventions Warden/rangerBalance/vestibular training, Community reintegration, Disease mangement/prevention, Neuromuscular re-education, Equities traderatient/family education, Self Care/advanced ADL retraining, Splinting/orthotics, Therapeutic Exercise, UE/LE Coordination activities, Wheelchair propulsion/positioning, UE/LE Strength taining/ROM, Therapeutic Activities, Skin care/wound managment, Psychosocial support, Pain management, Functional mobility training, DME/adaptive equipment instruction, Discharge planning, Cognitive remediation/compensation  SLP Interventions Dysphagia/aspiration precaution training  TR Interventions    SW/CM Interventions Discharge Planning, Psychosocial Support, Patient/Family Education   Barriers to Discharge MD  Medical stability  Nursing      PT Decreased caregiver support, Medical stability, Home environment access/layout    OT      SLP      SW       Team Discharge Planning: Destination: PT-Home ,OT- Home , SLP-Home Projected Follow-up: PT-Home health PT, OT-  Home health OT, SLP-None Projected Equipment Needs: PT-To be determined, OT- Tub/shower bench, 3 in 1 bedside comode, SLP-None recommended by SLP Equipment Details: PT-TBD pending progress, OT-  Patient/family involved in discharge planning: PT- Patient,  OT-Patient, SLP-Patient  MD ELOS: 24-27 days Medical Rehab Prognosis:  Good Assessment: The patient has been admitted for CIR therapies  with the diagnosis of cervical myelopathy. Course complicated by significant colonic  ileus. The team will be addressing functional mobility, strength, stamina, balance, safety, adaptive techniques and equipment, self-care, bowel and bladder mgt, patient and caregiver education, NMR, pain mgt, family ed. Goals have been set at min assist for self-care and mobility.    Ranelle Oyster, MD, FAAPMR      See Team Conference Notes for weekly updates to the plan of care

## 2018-08-22 NOTE — Progress Notes (Signed)
Social Work  Social Work Assessment and Plan  Patient Details  Name: Brandon OchsCarl S Heinke MRN: [id number] Date of Birth: [birth year]  Today's Date: 08/22/2018  Problem List:  Patient Active Problem List   Diagnosis Date Noted  . Pain   . Cervical myelopathy (HCC) 08/19/2018  . Cervical cord compression with myelopathy (HCC)   . Postoperative pain   . Diabetes mellitus type 2 in nonobese (HCC)   . Essential hypertension   . Dyslipidemia   . Drug induced constipation   . Myelopathy (HCC) 08/12/2018   Past Medical History:  Past Medical History:  Diagnosis Date  . Arthritis    "all over" (08/12/2018)  . High cholesterol   . Hypertension   . Type II diabetes mellitus (HCC)    Past Surgical History:  Past Surgical History:  Procedure Laterality Date  . ANTERIOR CERVICAL DECOMP/DISCECTOMY FUSION  08/12/2018   CERVICAL 3-4, CERVICAL 4-5, CERVICAL 5-6, CERVICAL 6-7 WITH INSTRUMENTATION AND ALLOGRAFT  . ANTERIOR CERVICAL DECOMPRESSION/DISCECTOMY FUSION 4 LEVELS N/A 08/12/2018   Procedure: ANTERIOR CERVICAL DECOMPRESSION FUSION CERVICAL 3-4, CERVICAL 4-5, CERVICAL 5-6, CERVICAL 6-7 WITH INSTRUMENTATION AND ALLOGRAFT;  Surgeon: Estill Bambergumonski, Mark, MD;  Location: [facility] OR;  Service: Orthopedics;  Laterality: N/A;  ANTERIOR CERVICAL DECOMPRESSION FUSION CERVICAL 3-4, CERVICAL 4-5, CERVICAL 5-6, CERVICAL 6-7 WITH INSTRUMENTATION AND ALLOGRAFT  . BACK SURGERY    . INGUINAL HERNIA REPAIR  1958   "? side"  . KNEE ARTHROSCOPY Right   . PILONIDAL CYST EXCISION     Social History:  reports that he has quit smoking. His smoking use included cigarettes. He has a 30.00 pack-year smoking history. He has never used smokeless tobacco. He reports that he drank alcohol. He reports that he does not use drugs.  Family / Support Systems Marital Status: Married How Long?: 38 yrs Patient Roles: Spouse, Parent Spouse/Significant Other: wife, Margaretmary BayleyDonna Glassco @ [phone](C) [phone] Children: daughter, Althea GrimmerGinger Winchester @ (C)  [phone] and son living locally, however, son works f/t and daughter is p/t.  Another daughter living in [place]. Anticipated Caregiver: wife and daughter and son Ability/Limitations of Caregiver: Wife works 3 minutes from home, can provide supervision intermittently.  A son and daughter work but can assist some. Caregiver Availability: Intermittent Family Dynamics: Wife very encouraging and supportive.  She is concerned about his slow recovery so far.  She notes her local children are supportive as well but limited by work hours.  Social History Preferred language: English Religion: Baptist Cultural Background: NA Read: Yes Write: Yes Employment Status: Retired Date Retired/Disabled/Unemployed: retired very recently Marine scientistLegal History/Current Legal Issues: None Guardian/Conservator: None - per MD, pt is capable of making decisions on his own behalf.   Abuse/Neglect Abuse/Neglect Assessment Can Be Completed: Yes Physical Abuse: Denies Verbal Abuse: Denies Sexual Abuse: Denies Exploitation of patient/patient's resources: Denies Self-Neglect: Denies  Emotional Status Pt's affect, behavior and adjustment status: Have attemtped x two days to conduct interview with pt, however, he is very fatigued and somewhat confused as he is dealing with an active ileus.  Wife provides assessment information and will monitor pt's mood as he becomes more able to engage.  Will refer for neuropsychology for next week in hopes that he is able to participate. Recent Psychosocial Issues: None Psychiatric History: None Substance Abuse History: None  Patient / Family Perceptions, Expectations & Goals Pt/Family understanding of illness & functional limitations: Wife with good, basic understanding of surgery performed and pt's current medical issues and functional limitations/ need for CIR.  Pt confirms he  had surgery but too much in discomfort to discuss further. Premorbid pt/family roles/activities: Pt with  declining mobility and with several falls over the past several months. Anticipated changes in roles/activities/participation: Per tx goals of min assist w/c, wife will need to assume primary caregiver role on 24/7 basis if she is able. Pt/family expectations/goals: "I don't know right now."  [employer]: None Premorbid Home Care/DME Agencies: None Transportation available at discharge: yes Resource referrals recommended: Neuropsychology  Discharge Planning Living Arrangements: Spouse/significant other Support Systems: Spouse/significant other, Children, Friends/neighbors Type of Residence: Private residence Insurance Resources: Insurance Case Production designer, theatre/television/film (specify name)(Cigna and Medicare Part A) Financial Screen Referred: No Living Expenses: Banker Management: Patient, Spouse Does the patient have any problems obtaining your medications?: No Home Management: mostly wife over the past several months Patient/Family Preliminary Plans: Pt expected to d/c home with wife if family able to coordinate expected 24/7 care Social Work Anticipated Follow Up Needs: HH/OP Expected length of stay: 21-24 days  Clinical Impression Unfortunate gentleman here following cervical surgery with several prior months of decreasing mobility and multiple falls.  Pt has been struggling the past couple of days with new ileus and unable to fully engage in assessment interview - wife provides most of the information.  Wife hopeful he will be able to more fully participate in CIR txs soon and be able to reach a LOF that she can manage at home.  Will monitor mood and refer for further support via neuropsychology.  Jacquez Sheetz 08/22/2018, 1:23 PM

## 2018-08-22 NOTE — Significant Event (Signed)
Hypoglycemic Event  CBG: 56  Treatment: D50 25 mL (12.5 gm)  Symptoms: None  Follow-up CBG: Time 10:20 CBG  Result: 80  Possible Reasons for Event: Inadequate meal intake  Comments/MD notified: Kirstens on call and notified.    Langley GaussJessica M Kashawn Manzano

## 2018-08-22 NOTE — Progress Notes (Signed)
Occupational Therapy Session Note  Patient Details  Name: Brandon Collins MRN: [id number] Date of Birth: [birth year]  Today's Date: 08/22/2018 OT Individual Time: [id number] OT Individual Time Calculation (min): 68 min    Short Term Goals: Week 1:  OT Short Term Goal 1 (Week 1): Pt will roll R and L with max +1 to reduce caregiver burden with LB dressing OT Short Term Goal 2 (Week 1): Pt will don shirt with (S) sitting EOB OT Short Term Goal 3 (Week 1): Pt will feed himself entire meal with LRD with CGA OT Short Term Goal 4 (Week 1): Pt will use STEDY with max +2 to transfer to w/c   Skilled Therapeutic Interventions/Progress Updates:    Patient in bed with nursing at start of session.  Continues with nasal tube and IV.  He is more alert this afternoon and responds to questions t/o session although dozes off without constant stimulation.  Wife is present for the session. ADL - OH shirt with max A, nursing to assist with IV and NG tube TE - completed rolling in bed max A and side lying (approx 10 minutes on each side) for weight shift, B  UE and LE  AAROM all planes shoulder to hand and hip to ankle - Patient able to count the reps for each exercise.  Patient oriented to year, month and self, he did not know the date or where he was.  He was able to introduce me to his wife and tell me about children and grandchildren.   Patient is limited by medical complications but able to follow directions and participate in exercises for movement of all limbs.  He pleasant and cooperative, states that he is exhausted at close of session.  Bed alarm set, soft wrist restraints in place and wife/nursing present.  Therapy Documentation Precautions:  Precautions Precautions: Cervical, Fall Precaution Comments: Reviewed cervical precautions Required Braces or Orthoses: Cervical Brace Cervical Brace: Hard collar, At all times Restrictions Weight Bearing Restrictions: No General:   Vital Signs: Therapy  Vitals Temp: 99.2 F (37.3 C) Temp Source: Oral Pulse Rate: 96 Resp: 20 BP: 118/61 Patient Position (if appropriate): Lying Oxygen Therapy SpO2: 90 % O2 Device: Room Air Pain: Pain Assessment Pain Scale: 0-10 Pain Score: 0-No pain   Therapy/Group: Individual Therapy  Barrie LymeStacey A Carlton Sweaney 08/22/2018, 3:44 PM

## 2018-08-22 NOTE — Progress Notes (Signed)
Physical Therapy Session Note  Patient Details  Name: Brandon OchsCarl S Collins MRN: [id number] Date of Birth: 02-10-1951  Today's Date: 08/22/2018 PT Individual Time: 1005-1050 PT Individual Time Calculation (min): 45 min   Short Term Goals: Week 1:  PT Short Term Goal 1 (Week 1): Pt will complete bed mobility with max A x 1 consistently PT Short Term Goal 2 (Week 1): Pt will maintain sitting balance with Supervision PT Short Term Goal 3 (Week 1): Pt will tolerate sitting OOB x 1 hour  Skilled Therapeutic Interventions/Progress Updates:   Pt in supine and agreeable to therapy, no pain at rest but yelling out in pain during exercises - did not rate. Session focused on LE strengthening and tolerance to functional activity. Performed LE strengthening exercises including SLR, quad sets, resisted hip extensions, and ankle pumps. All exercises painfu and required heavy assist from therapist w/ trace muscle contraction. Pt tolerated only 4-5 reps at a time of each. Transferred to EOB to work on static/upright sitting w/o back support, max assist x1 for transfer to/from EOB and to maintain sitting w/ tactile and verbal cues for posture. Pt w/ increased back pain in this position, able to tolerate 30-60 sec before needing to lay back down. Pt reporting he felt like he needed the bed pan, mod-max assist to roll in both directions to remove LE garments and place/remove bed pan. Pt w/ incontinent BM episode, total assist for pericare and brief management. Performed multiple rolls w/ verbal and manual cues for technique, pt w/ multiple incontinent episodes, requiring extra time for pericare. Pt w/ increased fatigue, unable to stay awake after bed pan use. Ended session in supine, all needs in reach w/ wrist restraints donned. Missed 15 min of skilled PT 2/2 fatigue.   Therapy Documentation Precautions:  Precautions Precautions: Cervical, Fall Precaution Comments: Reviewed cervical precautions Required Braces or  Orthoses: Cervical Brace Cervical Brace: Hard collar, At all times Restrictions Weight Bearing Restrictions: No Pain: Pain Assessment Pain Scale: Faces Pain Score: 0-No pain Faces Pain Scale: Hurts whole lot(LE pain with movement) Pain Location: Leg Pain Orientation: Right;Left Pain Descriptors / Indicators: Tightness Pain Onset: With Activity  Therapy/Group: Individual Therapy  Jahaira Earnhart K Denney Shein 08/22/2018, 10:55 AM

## 2018-08-22 NOTE — Progress Notes (Signed)
[facility] PHYSICAL MEDICINE & REHABILITATION PROGRESS NOTE   Subjective/Complaints: Pt still uncomfortable. Problems with NGT and IV due to numerous issues including patient pulling on lines. Pt now in soft wrist restraints because of it. Is passing gas actively.   ROS: Limited due to cognitive/behavioral   Objective:   Dg Chest 2 View  Result Date: 08/20/2018 CLINICAL DATA:  Aspiration into airway. EXAM: CHEST - 2 VIEW COMPARISON:  None. FINDINGS: Low lung volumes with bibasilar opacities which could reflect atelectasis or infiltrates/pneumonia. No effusions. Heart is borderline, accentuated by the AP nature of the study and low volumes. IMPRESSION: Low lung volumes.  Bibasilar atelectasis or infiltrates. Electronically Signed   By: Charlett Nose M.D.   On: 08/20/2018 21:25   Dg Ankle 2 Views Right  Result Date: 08/20/2018 CLINICAL DATA:  67 year old male with a history decubitus ulcer EXAM: RIGHT ANKLE - 2 VIEW COMPARISON:  None. FINDINGS: No acute displaced fracture. Degenerative changes at the hindfoot and ankle. Enthesopathic changes at the plantar fascia insertion and Achilles insertion. Calcifications of the pedal and tibial arteries. IMPRESSION: No acute bony abnormality. Degenerative changes of the hindfoot. Arterial calcifications of tibial and pedal arteries, which can be seen in the setting diabetes. If there is concern for arterial compromise contributing to the wound, correlation with noninvasive arterial testing/ABI may be useful. Electronically Signed   By: Gilmer Mor D.O.   On: 08/20/2018 12:22   Dg Abd 1 View  Result Date: 08/22/2018 CLINICAL DATA:  Follow-up ileus EXAM: ABDOMEN - 1 VIEW COMPARISON:  08/21/2018 FINDINGS: Scattered air is noted throughout the large and small bowel. The overall appearance is similar to that seen on the prior exam. Nasogastric catheter is noted at the tip of the film and may have been withdrawn slightly from the prior study. IMPRESSION:  Persistent changes of ileus. The nasogastric catheter appears to have been slightly withdrawn. Electronically Signed   By: Alcide Clever M.D.   On: 08/22/2018 07:57   Dg Abd 1 View  Result Date: 08/21/2018 CLINICAL DATA:  Nasogastric tube placement. EXAM: ABDOMEN - 1 VIEW COMPARISON:  Earlier today. FINDINGS: Interval nasogastric tube with its tip and side hole in the mid stomach. Again demonstrated is calcification in the right upper abdomen with an appearance suggesting gallbladder wall calcification. Gas distended loops of colon and small bowel are again demonstrated without significant change. Thoracolumbar spine degenerative changes. IMPRESSION: 1. Nasogastric tube tip and side hole in the mid stomach. 2. 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 Again, obstruction is less likely but not excluded. 3. 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 This has an increased risk of gallbladder cancer. Electronically Signed   By: Beckie Salts M.D.   On: 08/21/2018 18:10   Dg Abd 1 View  Result Date: 08/21/2018 CLINICAL DATA:  Ileus. EXAM: ABDOMEN - 1 VIEW COMPARISON:  No recent prior. FINDINGS: Dilated loops of small and large bowel noted. These findings consistent adynamic ileus. Follow-up exams to demonstrate clearing in order to exclude bowel obstruction suggested. Rounded calcific density noted over the right upper quadrant. Porcelain gallbladder could present in this fashion. Aortoiliac atherosclerotic vascular calcification. Degenerative change lumbar spine. IMPRESSION: 1. Distended loops of small and large bowel noted. These findings are consistent with adynamic ileus. Follow-up exam is suggested to demonstrate clearing in order to exclude bowel obstruction. 2. Rounded calcific density noted the right upper quadrant. Porcelain gallbladder could present this fashion. Electronically Signed   By: Maisie Fus  Register   On: 08/21/2018 12:34  Dg Abd Portable 1v  Result Date: 08/21/2018 CLINICAL DATA:  NG tube  placement EXAM: PORTABLE ABDOMEN - 1 VIEW COMPARISON:  08/21/2017 FINDINGS: NG tube is in the mid stomach.  Stable colonic dilatation. IMPRESSION: NG tube in the mid stomach. Electronically Signed   By: Charlett NoseKevin  Dover M.D.   On: 08/21/2018 23:25   Dg Abd Portable 1v  Result Date: 08/21/2018 CLINICAL DATA:  Nasogastric tube placement. EXAM: PORTABLE ABDOMEN - 1 VIEW COMPARISON:  Earlier today. FINDINGS: Nasogastric tube tip in the proximal to mid stomach and side hole in the distal esophagus. Patchy opacity at the right lung base. No significant change in dilated, gas-filled loops of colon. Gas-filled small bowel loops are less prominent. Lumbar and lower thoracic spine degenerative changes. IMPRESSION: 1. Nasogastric tube tip in the proximal to mid stomach and side hole in the distal esophagus. It is recommended that this be advanced 10 cm. 2. Stable colonic ileus or distal obstruction. 3. Patchy opacity at the right lung base with an appearance compatible pneumonia. Electronically Signed   By: Beckie SaltsSteven  Reid M.D.   On: 08/21/2018 22:23   Dg Abd Portable 1v  Result Date: 08/21/2018 CLINICAL DATA:  Encounter for imaging study to confirm nasogastric (NG) tube placement EXAM: PORTABLE ABDOMEN - 1 VIEW COMPARISON:  08/21/2018 FINDINGS: Nasogastric tube is in place, tip overlying the level of the stomach. There is gaseous distension of large and small bowel loops, consistent with ileus. No evidence for free intraperitoneal air. IMPRESSION: Nasogastric tube tip overlying the level of the stomach. Persistent ileus. Electronically Signed   By: Norva PavlovElizabeth  Brown M.D.   On: 08/21/2018 21:10   Dg Foot 2 Views Right  Result Date: 08/20/2018 CLINICAL DATA:  ? Bed sore lateral aspect right ankle/proximal foot EXAM: RIGHT FOOT - 2 VIEW COMPARISON:  None. FINDINGS: Osseous alignment is normal. No acute or suspicious osseous finding. No focal demineralization or destructive changes to suggest osteomyelitis. Bandages overlie  the lateral aspects of the proximal foot. No evidence of soft tissue gas seen incidental note made osseous spurring at the plantar margin of the posterior calcaneus. IMPRESSION: No acute findings. No evidence of osteomyelitis. Electronically Signed   By: Bary RichardStan  Maynard M.D.   On: 08/20/2018 12:22   Recent Labs    08/21/18 0650 08/21/18 1523  WBC 17.0* 15.7*  HGB 13.3 13.4  HCT 39.8 40.4  PLT 405* 434*   Recent Labs    08/21/18 1410 08/22/18 0510  NA 129* 130*  K 4.5 4.4  CL 91* 95*  CO2 19* 19*  GLUCOSE 171* 163*  BUN 81* 96*  CREATININE 2.12* 2.09*  CALCIUM 8.3* 8.2*    Intake/Output Summary (Last 24 hours) at 08/22/2018 0907 Last data filed at 08/22/2018 0530 Gross per 24 hour  Intake 502.71 ml  Output 1675 ml  Net -1172.29 ml     Physical Exam: Vital Signs Blood pressure (!) 113/93, pulse 89, temperature 98.2 F (36.8 C), temperature source Oral, resp. rate 18, height 6' (1.829 m), weight 103.3 kg, SpO2 92 %.  Constitutional: uncomfortable . Vital signs reviewed. HEENT: EOMI, oral membranes moist Neck: supple Cardiovascular: RRR without murmur. No JVD    Respiratory: CTA Bilaterally without wheezes or rales. Normal effort    GI: high-pitched scarce bowel sounds, distended Musculoskeletal:  Lower extremity edema R>L.--unchanged. Right ankle/foot tender still to palpation Neurological: he is alert. Follows basic commands.  Motor: Right upper extremity: 4+/5 proximal distal Left upper extremity: 5/5 proximal distal Bilateral lower extremities: R:LE:  1/5 Hip flexion,1/5  knee extension 0/5, ankle dorsiflexion, APF 1-2/5, pain related also. LLE: 2-3/5 prox to distal. -no changes.  Sensation intact light touch  Skin: Skin is warm and dry.  Surgical site remains clean and dry  Psychiatric: flat but cooperative   Assessment/Plan: 1. Functional deficits secondary to cervical myelopathy which require 3+ hours per day of interdisciplinary therapy in a comprehensive  inpatient rehab setting.  Physiatrist is providing close team supervision and 24 hour management of active medical problems listed below.  Physiatrist and rehab team continue to assess barriers to discharge/monitor patient progress toward functional and medical goals  Care Tool:  Bathing    Body parts bathed by patient: Chest, Abdomen, Front perineal area, Face   Body parts bathed by helper: Right arm, Left arm, Buttocks, Right upper leg, Left upper leg, Right lower leg, Left lower leg     Bathing assist Assist Level: Maximal Assistance - Patient 24 - 49%     Upper Body Dressing/Undressing Upper body dressing   What is the patient wearing?: Pull over shirt    Upper body assist Assist Level: Minimal Assistance - Patient > 75%    Lower Body Dressing/Undressing Lower body dressing      What is the patient wearing?: Pants     Lower body assist Assist for lower body dressing: Moderate Assistance - Patient 50 - 74%     Toileting Toileting    Toileting assist Assist for toileting: 2 Helpers     Transfers Chair/bed transfer  Transfers assist  Chair/bed transfer activity did not occur: Safety/medical concerns  Chair/bed transfer assist level: 2 Helpers     Locomotion Ambulation   Ambulation assist   Ambulation activity did not occur: Safety/medical concerns          Walk 10 feet activity   Assist  Walk 10 feet activity did not occur: Safety/medical concerns        Walk 50 feet activity   Assist Walk 50 feet with 2 turns activity did not occur: Safety/medical concerns         Walk 150 feet activity   Assist Walk 150 feet activity did not occur: Safety/medical concerns         Walk 10 feet on uneven surface  activity   Assist Walk 10 feet on uneven surfaces activity did not occur: Safety/medical concerns         Wheelchair     Assist Will patient use wheelchair at discharge?: Yes Type of Wheelchair: Manual Wheelchair  activity did not occur: Safety/medical concerns  Wheelchair assist level: Minimal Assistance - Patient > 75% Max wheelchair distance: 25'    Wheelchair 50 feet with 2 turns activity    Assist    Wheelchair 50 feet with 2 turns activity did not occur: Safety/medical concerns       Wheelchair 150 feet activity     Assist Wheelchair 150 feet activity did not occur: Safety/medical concerns         Medical Problem List and Plan: 1.  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 Cervical collar at all times  --bedside therapies today only 2.  DVT Prophylaxis/Anticoagulation: SCDs. Vascular study negative  -  lovenox 30mg  q12   3. Pain Management:  Oxycodone and Valium as needed.  -right ankle pain/swelling. Mild OA on XR 4. Mood:  Provide emotional support 5. Neuropsych: This patient is capable of making decisions on his own behalf. 6. Skin/Wound Care:  Routine skin checks  7. Fluids/Electrolytes/Nutrition, acute renal insufficiency:    -BUN/Cr 81/2.12 12/5 --> 96/2.09 12/5  -IV fluids not started until late yesterday  -continue NS IVF with KCl at 100cc/hr  -hold lisinopril and metformin  -repeat BMET at 1300 today 8. Diabetes mellitus. Glucophage 1500 mg daily, Actos 15 mg daily, Amaryl 8 mg daily.   -hold glucophage per above 9. Hypertension. Lisinopril 40 mg daily, clonidine 0.1 mg twice a day, Norvasc 10 mg daily.   -hold lisinopril per above 10. Hyperlipidemia. Lipitor 11. Constipation. Laxative assistance 12. Leukocytosis:  15k yesterday.      -urine remains clear without odor   -cxr with atelectasis   -might be reactive to potential ileus.   -observe for now, recheck cbc this afternoon 13. Colonic ileus:   -today's KUB reviewed and without significant change.   --NPO  - NGT to suction with some output of dark brown fluid  -IV reglan  -repeat SSE today  -repeat KUB today  -IVF as above       LOS: 3 days A  FACE TO FACE EVALUATION WAS PERFORMED  Ranelle Oyster 08/22/2018, 9:07 AM

## 2018-08-22 NOTE — Progress Notes (Signed)
Occupational Therapy Session Note  Patient Details  Name: Brandon OchsCarl S Cafarella MRN: [id number] Date of Birth: June 07, 1951  Today's Date: 08/22/2018 OT Individual Time: [id number] OT Individual Time Calculation (min): 60 min    Short Term Goals: Week 1:  OT Short Term Goal 1 (Week 1): Pt will roll R and L with max +1 to reduce caregiver burden with LB dressing OT Short Term Goal 2 (Week 1): Pt will don shirt with (S) sitting EOB OT Short Term Goal 3 (Week 1): Pt will feed himself entire meal with LRD with CGA OT Short Term Goal 4 (Week 1): Pt will use STEDY with max +2 to transfer to w/c    Skilled Therapeutic Interventions/Progress Updates:    Pt received in bed with IV connected and NG tube connected.  Pt with eyes closed but could answer a few questions with intermittent alertness.  Was not able to change shirt or remove shirt to bathe UB due to lines.  Pt was able to use each hand to partially wash Upper thighs with knees slightly bent.  He rolled in bed with max A for donning pants with total A.  Pt has edema in B LE with limited ROM in knees and ankles.  Applied TED hose and worked with pt on PROM and slight A/arom for hip, knee, ankle flex/ext, abd/add, int/ext rotation using maxislide under foot to allow for smoother movement.  Pt would grimace with increased flexion so used facial expression to determine the range of motion pt could handle.   UEs are much weaker today.  Facilitated UE AROM with 1# dowel bar with mod A.  Mod A to lift each arm up and down but pt did actively count. Pt fell asleep at end of session. Wrist restraints reapplied, bed alarm on.    Therapy Documentation Precautions:  Precautions Precautions: Cervical, Fall Precaution Comments: Reviewed cervical precautions Required Braces or Orthoses: Cervical Brace Cervical Brace: Hard collar, At all times Restrictions Weight Bearing Restrictions: No   Pain: Pain Assessment Pain Scale: Faces Pain Score: 0-No pain Faces Pain  Scale: Hurts whole lot(LE pain with movement) Pain Location: Leg Pain Orientation: Right;Left Pain Descriptors / Indicators: Tightness Pain Onset: With Activity     Therapy/Group: Individual Therapy  Deshia Vanderhoof 08/22/2018, 10:54 AM

## 2018-08-23 ENCOUNTER — Inpatient Hospital Stay (HOSPITAL_COMMUNITY): Payer: Managed Care, Other (non HMO)

## 2018-08-23 DIAGNOSIS — T383X4A Poisoning by insulin and oral hypoglycemic [antidiabetic] drugs, undetermined, initial encounter: Secondary | ICD-10-CM

## 2018-08-23 DIAGNOSIS — E160 Drug-induced hypoglycemia without coma: Secondary | ICD-10-CM

## 2018-08-23 DIAGNOSIS — G959 Disease of spinal cord, unspecified: Secondary | ICD-10-CM

## 2018-08-23 DIAGNOSIS — K560 Paralytic ileus: Secondary | ICD-10-CM

## 2018-08-23 DIAGNOSIS — G8250 Quadriplegia, unspecified: Secondary | ICD-10-CM

## 2018-08-23 LAB — CBC
HCT: 37.3 % — ABNORMAL LOW (ref 39.0–52.0)
Hemoglobin: 12.1 g/dL — ABNORMAL LOW (ref 13.0–17.0)
MCH: 28.3 pg (ref 26.0–34.0)
MCHC: 32.4 g/dL (ref 30.0–36.0)
MCV: 87.4 fL (ref 80.0–100.0)
Platelets: 375 10*3/uL (ref 150–400)
RBC: 4.27 MIL/uL (ref 4.22–5.81)
RDW: 12.9 % (ref 11.5–15.5)
WBC: 7.4 10*3/uL (ref 4.0–10.5)
nRBC: 0 % (ref 0.0–0.2)

## 2018-08-23 LAB — BASIC METABOLIC PANEL
Anion gap: 11 (ref 5–15)
BUN: 77 mg/dL — ABNORMAL HIGH (ref 8–23)
CO2: 21 mmol/L — ABNORMAL LOW (ref 22–32)
Calcium: 8.2 mg/dL — ABNORMAL LOW (ref 8.9–10.3)
Chloride: 102 mmol/L (ref 98–111)
Creatinine, Ser: 1.17 mg/dL (ref 0.61–1.24)
GFR calc Af Amer: >60 mL/min (ref >60)
GFR calc non Af Amer: >60 mL/min (ref >60)
Glucose, Bld: 58 mg/dL — ABNORMAL LOW (ref 70–99)
Potassium: 3.6 mmol/L (ref 3.5–5.1)
Sodium: 134 mmol/L — ABNORMAL LOW (ref 135–145)

## 2018-08-23 LAB — GLUCOSE, CAPILLARY
Glucose-Capillary: 31 mg/dL — CL (ref 70–99)
Glucose-Capillary: 80 mg/dL (ref 70–99)
Glucose-Capillary: 46 mg/dL — ABNORMAL LOW (ref 70–99)
Glucose-Capillary: 60 mg/dL — ABNORMAL LOW (ref 70–99)
Glucose-Capillary: 88 mg/dL (ref 70–99)
Glucose-Capillary: 87 mg/dL (ref 70–99)
Glucose-Capillary: 136 mg/dL — ABNORMAL HIGH (ref 70–99)

## 2018-08-23 LAB — MAGNESIUM: Magnesium: 2.3 mg/dL (ref 1.7–2.4)

## 2018-08-23 MED ORDER — DEXTROSE 50 % IV SOLN
12.5000 g | INTRAVENOUS | Status: AC
  Administered 2018-08-23: 12.5 g via INTRAVENOUS

## 2018-08-23 MED ORDER — DEXTROSE 50 % IV SOLN
INTRAVENOUS | Status: AC
  Administered 2018-08-23: 12.5 g via INTRAVENOUS
  Filled 2018-08-23: qty 50

## 2018-08-23 MED ORDER — POTASSIUM CHLORIDE 2 MEQ/ML IV SOLN
INTRAVENOUS | Status: DC
  Filled 2018-08-23 (×3): qty 1000

## 2018-08-23 MED ORDER — DEXTROSE 50 % IV SOLN: 25.0000 g | INTRAVENOUS | Status: AC

## 2018-08-23 MED ORDER — GLIMEPIRIDE 2 MG PO TABS
1.0000 mg | ORAL_TABLET | Freq: Every day | ORAL | Status: DC
  Administered 2018-08-24 – 2018-08-25 (×2): 1 mg via ORAL
  Filled 2018-08-23 (×3): qty 1

## 2018-08-23 MED ORDER — DEXTROSE 50 % IV SOLN
INTRAVENOUS | Status: AC
  Administered 2018-08-23: 25 g via INTRAVENOUS
  Filled 2018-08-23: qty 50

## 2018-08-23 MED ORDER — KCL IN DEXTROSE-NACL 20-5-0.45 MEQ/L-%-% IV SOLN
INTRAVENOUS | Status: DC
  Administered 2018-08-23 – 2018-08-25 (×4): via INTRAVENOUS
  Filled 2018-08-23 (×5): qty 1000

## 2018-08-23 MED ORDER — TRAMADOL HCL 50 MG PO TABS
50.0000 mg | ORAL_TABLET | Freq: Four times a day (QID) | ORAL | Status: DC | PRN
  Administered 2018-08-25 – 2018-09-17 (×25): 50 mg via ORAL
  Filled 2018-08-23 (×28): qty 1

## 2018-08-23 MED ORDER — DEXTROSE 50 % IV SOLN
25.0000 g | INTRAVENOUS | Status: AC
  Administered 2018-08-23: 25 g via INTRAVENOUS

## 2018-08-23 NOTE — Progress Notes (Signed)
Hypoglycemic Event  CBG: 46  Treatment: D50 25 mL (12.5 gm)  Symptoms: None  Follow-up CBG: Time:11:59 CBG Result:60   Possible Reasons for Event: Inadequate meal intake  Comments/MD notified:Noitified Dr Larna DaughtersKirstens.     Lorri FrederickMartha E Deondra Wigger

## 2018-08-23 NOTE — Progress Notes (Signed)
Hypoglycemic Event  CBG: 60   Treatment: D50 25 mL (12.5 gm)  Symptoms: None  Follow-up CBG: Time:12:19  CBG Result:88   Possible Reasons for Event: Inadequate meal intake  Comments/MD notified: Dr. Larna DaughtersKirstens notified     Brandon Collins

## 2018-08-23 NOTE — Progress Notes (Signed)
[facility] PHYSICAL MEDICINE & REHABILITATION PROGRESS NOTE   Subjective/Complaints: Patient denies abdominal pain nausea or vomiting.  NG intermittent suction remains, patient is awake and answers questions appropriately although mildly lethargic  ROS: Limited due to cognitive/behavioral   Objective:   Dg Abd 1 View  Result Date: 08/23/2018 CLINICAL DATA:  Ileus. EXAM: ABDOMEN - 1 VIEW COMPARISON:  Abdominal x-rays from yesterday. FINDINGS: Enteric tube tip in the stomach with the proximal side port at the gastroesophageal junction. Unchanged diffuse large and small bowel distention with air. No acute osseous abnormality. IMPRESSION: 1. Unchanged ileus. 2. Enteric tube with proximal side port at the gastroesophageal junction. Recommend advancement 3-4 cm. Electronically Signed   By: Obie Dredge M.D.   On: 08/23/2018 10:12   Dg Abd 1 View  Result Date: 08/22/2018 CLINICAL DATA:  Follow-up ileus EXAM: ABDOMEN - 1 VIEW COMPARISON:  08/21/2018 FINDINGS: Scattered air is noted throughout the large and small bowel. The overall appearance is similar to that seen on the prior exam. Nasogastric catheter is noted at the tip of the film and may have been withdrawn slightly from the prior study. IMPRESSION: Persistent changes of ileus. The nasogastric catheter appears to have been slightly withdrawn. Electronically Signed   By: Alcide Clever M.D.   On: 08/22/2018 07:57   Dg Abd 1 View  Result Date: 08/21/2018 CLINICAL DATA:  Nasogastric tube placement. EXAM: ABDOMEN - 1 VIEW COMPARISON:  Earlier today. FINDINGS: Interval nasogastric tube with its tip and side hole in the mid stomach. Again demonstrated is calcification in the right upper abdomen with an appearance suggesting gallbladder wall calcification. Gas distended loops of colon and small bowel are again demonstrated without significant change. Thoracolumbar spine degenerative changes. IMPRESSION: 1. Nasogastric tube tip and side hole in the mid  stomach. 2. 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 Again, obstruction is less likely but not excluded. 3. 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 This has an increased risk of gallbladder cancer. Electronically Signed   By: Beckie Salts M.D.   On: 08/21/2018 18:10   Dg Abd Portable 1v  Result Date: 08/21/2018 CLINICAL DATA:  NG tube placement EXAM: PORTABLE ABDOMEN - 1 VIEW COMPARISON:  08/21/2017 FINDINGS: NG tube is in the mid stomach.  Stable colonic dilatation. IMPRESSION: NG tube in the mid stomach. Electronically Signed   By: Charlett Nose M.D.   On: 08/21/2018 23:25   Dg Abd Portable 1v  Result Date: 08/21/2018 CLINICAL DATA:  Nasogastric tube placement. EXAM: PORTABLE ABDOMEN - 1 VIEW COMPARISON:  Earlier today. FINDINGS: Nasogastric tube tip in the proximal to mid stomach and side hole in the distal esophagus. Patchy opacity at the right lung base. No significant change in dilated, gas-filled loops of colon. Gas-filled small bowel loops are less prominent. Lumbar and lower thoracic spine degenerative changes. IMPRESSION: 1. Nasogastric tube tip in the proximal to mid stomach and side hole in the distal esophagus. It is recommended that this be advanced 10 cm. 2. Stable colonic ileus or distal obstruction. 3. Patchy opacity at the right lung base with an appearance compatible pneumonia. Electronically Signed   By: Beckie Salts M.D.   On: 08/21/2018 22:23   Dg Abd Portable 1v  Result Date: 08/21/2018 CLINICAL DATA:  Encounter for imaging study to confirm nasogastric (NG) tube placement EXAM: PORTABLE ABDOMEN - 1 VIEW COMPARISON:  08/21/2018 FINDINGS: Nasogastric tube is in place, tip overlying the level of the stomach. There is gaseous distension of large and small bowel loops, consistent  with ileus. No evidence for free intraperitoneal air. IMPRESSION: Nasogastric tube tip overlying the level of the stomach. Persistent ileus. Electronically Signed   By: Norva PavlovElizabeth  Brown M.D.   On:  08/21/2018 21:10   Recent Labs    08/21/18 1523 08/23/18 0320  WBC 15.7* 7.4  HGB 13.4 12.1*  HCT 40.4 37.3*  PLT 434* 375   Recent Labs    08/22/18 1455 08/23/18 0320  NA 131* 134*  K 4.6 3.6  CL 97* 102  CO2 19* 21*  GLUCOSE 86 58*  BUN 99* 77*  CREATININE 1.79* 1.17  CALCIUM 8.3* 8.2*    Intake/Output Summary (Last 24 hours) at 08/23/2018 1325 Last data filed at 08/23/2018 0916 Gross per 24 hour  Intake 0 ml  Output 2025 ml  Net -2025 ml     Physical Exam: Vital Signs Blood pressure 124/79, pulse 80, temperature 98.1 F (36.7 C), temperature source Oral, resp. rate 18, height 6' (1.829 m), weight 103.3 kg, SpO2 94 %.  Constitutional: uncomfortable . Vital signs reviewed. HEENT: EOMI, oral membranes moist Neck: supple Cardiovascular: RRR without murmur. No JVD    Respiratory: CTA Bilaterally without wheezes or rales. Normal effort    GI: Sent bowel sounds, distended, nontender Musculoskeletal:  Lower extremity edema R>L.--unchanged. Right ankle/foot tender still to palpation Neurological: he is alert. Follows basic commands.  Motor: Right upper extremity: 4+/5 proximal distal Left upper extremity: 5/5 proximal distal Bilateral lower extremities: Does not cooperate well with examination today Sensation intact light touch  Skin: Skin is warm and dry.  Surgical site remains clean and dry  Psychiatric: flat but cooperative   Assessment/Plan: 1. Functional deficits secondary to cervical myelopathy which require 3+ hours per day of interdisciplinary therapy in a comprehensive inpatient rehab setting.  Physiatrist is providing close team supervision and 24 hour management of active medical problems listed below.  Physiatrist and rehab team continue to assess barriers to discharge/monitor patient progress toward functional and medical goals  Care Tool:  Bathing    Body parts bathed by patient: Right upper leg, Left upper leg(LB only with 50% A for upper  legs)   Body parts bathed by helper: Right lower leg, Left lower leg     Bathing assist Assist Level: Maximal Assistance - Patient 24 - 49%     Upper Body Dressing/Undressing Upper body dressing   What is the patient wearing?: Pull over shirt    Upper body assist Assist Level: 2 Helpers    Lower Body Dressing/Undressing Lower body dressing      What is the patient wearing?: Pants     Lower body assist Assist for lower body dressing: Dependent - Patient 0%     Toileting Toileting    Toileting assist Assist for toileting: 2 Helpers     Transfers Chair/bed transfer  Transfers assist  Chair/bed transfer activity did not occur: Safety/medical concerns  Chair/bed transfer assist level: 2 Helpers     Locomotion Ambulation   Ambulation assist   Ambulation activity did not occur: Safety/medical concerns          Walk 10 feet activity   Assist  Walk 10 feet activity did not occur: Safety/medical concerns        Walk 50 feet activity   Assist Walk 50 feet with 2 turns activity did not occur: Safety/medical concerns         Walk 150 feet activity   Assist Walk 150 feet activity did not occur: Safety/medical concerns  Walk 10 feet on uneven surface  activity   Assist Walk 10 feet on uneven surfaces activity did not occur: Safety/medical concerns         Wheelchair     Assist Will patient use wheelchair at discharge?: Yes Type of Wheelchair: Manual Wheelchair activity did not occur: Safety/medical concerns  Wheelchair assist level: Minimal Assistance - Patient > 75% Max wheelchair distance: 1'    Wheelchair 50 feet with 2 turns activity    Assist    Wheelchair 50 feet with 2 turns activity did not occur: Safety/medical concerns       Wheelchair 150 feet activity     Assist Wheelchair 150 feet activity did not occur: Safety/medical concerns         Medical Problem List and Plan: 1.  Quadriparesis  secondary to  Cervical myelopathy status post ACDF C3-C7 08/12/2018. Cervical collar at all times  --bedside therapies today only, continue with bedside therapy 2.  DVT Prophylaxis/Anticoagulation: SCDs. Vascular study negative  -  lovenox 30mg  q12   3. Pain Management:  Oxycodone and Valium as needed.  -right ankle pain/swelling. Mild OA on XR 4. Mood:  Provide emotional support 5. Neuropsych: This patient is capable of making decisions on his own behalf. 6. Skin/Wound Care:  Routine skin checks 7. Fluids/Electrolytes/Nutrition, acute renal insufficiency:    -Proving with IV hydration    -Change IV fluids to D5 0.45 normal saline +20 KCl.  This will hopefully avoid hypoglycemia  -hold lisinopril and metformin, BUN/creatinine improved 77 and 1.17   8. Diabetes mellitus. Glucophage 1500 mg daily, Actos 15 mg daily, Amaryl 8 mg daily.   -hold glucophage per above, given hypoglycemia will reduce Amaryl to 1 mg 9. Hypertension. Lisinopril 40 mg daily, clonidine 0.1 mg twice a day, Norvasc 10 mg daily.   -hold lisinopril per above 10. Hyperlipidemia. Lipitor 11. Constipation. Laxative assistance 12. Leukocytosis:  15k yesterday.  Resolved 7.4K today       -cxr with atelectasis   -might be reactive to potential ileus.   - 13. Colonic ileus:   -today's KUB reviewed and without significant change.   --NPO  - NGT to suction with some output of dark brown fluid  -IV reglan    -IVF as above   Discontinue Percocet, switch to tramadol given lethargy and constipation  LOS: 4 days A FACE TO FACE EVALUATION WAS PERFORMED  Erick Colace 08/23/2018, 1:25 PM

## 2018-08-24 ENCOUNTER — Inpatient Hospital Stay (HOSPITAL_COMMUNITY): Payer: Managed Care, Other (non HMO)

## 2018-08-24 ENCOUNTER — Inpatient Hospital Stay (HOSPITAL_COMMUNITY): Payer: Managed Care, Other (non HMO) | Admitting: Physical Therapy

## 2018-08-24 LAB — GLUCOSE, CAPILLARY
Glucose-Capillary: 146 mg/dL — ABNORMAL HIGH (ref 70–99)
Glucose-Capillary: 181 mg/dL — ABNORMAL HIGH (ref 70–99)
Glucose-Capillary: 169 mg/dL — ABNORMAL HIGH (ref 70–99)
Glucose-Capillary: 223 mg/dL — ABNORMAL HIGH (ref 70–99)

## 2018-08-24 NOTE — Progress Notes (Addendum)
Physical Therapy Session Note  Patient Details  Name: Brandon Collins MRN: [id number] Date of Birth: 25-Aug-1951  Today's Date: 08/24/2018 PT Individual Time: [id number] and 1100-1154 PT Individual Time Calculation (min): 54 min and 54 min   Short Term Goals: Week 1:  PT Short Term Goal 1 (Week 1): Pt will complete bed mobility with max A x 1 consistently PT Short Term Goal 2 (Week 1): Pt will maintain sitting balance with Supervision PT Short Term Goal 3 (Week 1): Pt will tolerate sitting OOB x 1 hour  Skilled Therapeutic Interventions/Progress Updates:    pt in bed requesting to use toilet, agreeable to [name].  Pt mod A for rolling, max A for sidelying to sit.  Sitting balance edge of bed with CGA.  Sliding board transfers to toilet with +2 mod A.  Forward leans for doffing brief in sitting as pt with increased pain with lateral leans.  Pt performs forward leans with mod/max A, total A for hygiene.  Pt performs squat/scoot pivot transfer back to bed with +2 mod A.  Lateral scoots edge of bed with max A.  Sit to supine with +2 assist for LEs and trunk.  Rolling in bed with mod A with total A to don brief and shorts.  Supine LE therex performed with AAROM 2 x 10 heel slides, SAQ and hip abd/add. Pt left in bed with wrist restraints applied, alarm set.  Session 2: pt rec'd in bed, no c/o pain at rest, agreeable to therapy.  Pt performs supine to sit with max A. Sliding board transfer to w/c with max A, +2 for safety.  W/c mobility throughout unit up to 30' at a time before fatigue.  Standing frame x 2 attempts, pt able to tolerate < 1 minute each attempt due to c/o back pain.  kinetron for bilat LE strengthening with min A for Lt LE 10 x 30 seconds.  Pt left in w/c with alarm set, needs at hand.  Therapy Documentation Precautions:  Precautions Precautions: Cervical, Fall Precaution Comments: Reviewed cervical precautions Required Braces or Orthoses: Cervical Brace Cervical Brace: Hard collar, At all  times Restrictions Weight Bearing Restrictions: No Pain:  pt c/o intermittent knee pain with mobility, eases with rest   Therapy/Group: Individual Therapy  DONAWERTH,KAREN 08/24/2018, 9:24 AM

## 2018-08-24 NOTE — Plan of Care (Signed)
  Problem: SCI BOWEL ELIMINATION Goal: RH STG SCI MANAGE BOWEL WITH MEDICATION WITH ASSISTANCE Description STG SCI Manage bowel with medication with assistance. Outcome: Progressing   Problem: SCI BLADDER ELIMINATION Goal: RH STG MANAGE BLADDER WITH ASSISTANCE Description STG Manage Bladder With Assistance Outcome: Progressing Goal: RH STG SCI MANAGE BLADDER PROGRAM W/ASSISTANCE Outcome: Progressing   Problem: RH SKIN INTEGRITY Goal: RH STG SKIN FREE OF INFECTION/BREAKDOWN Outcome: Progressing   Problem: RH SAFETY Goal: RH STG ADHERE TO SAFETY PRECAUTIONS W/ASSISTANCE/DEVICE Description STG Adhere to Safety Precautions With Assistance/Device. Outcome: Progressing

## 2018-08-24 NOTE — Progress Notes (Signed)
Occupational Therapy Session Note  Patient Details  Name: Brandon Collins MRN: [id number] Date of Birth: 1951/09/11  Today's Date: 08/24/2018 OT Individual Time: 1300-1400 OT Individual Time Calculation (min): 60 min  and Today's Date: 08/24/2018 OT Missed Time: 15 Minutes Missed Time Reason: Patient fatigue   Short Term Goals: Week 1:  OT Short Term Goal 1 (Week 1): Pt will roll R and L with max +1 to reduce caregiver burden with LB dressing OT Short Term Goal 2 (Week 1): Pt will don shirt with (S) sitting EOB OT Short Term Goal 3 (Week 1): Pt will feed himself entire meal with LRD with CGA OT Short Term Goal 4 (Week 1): Pt will use STEDY with max +2 to transfer to w/c   Skilled Therapeutic Interventions/Progress Updates:    1:1. Pt received seated in bed with NT reporting just putting him back to bed. Pt agreeable to self feeding and working on arms while at bed level.   Pt completes AROM 2x10 exercises of BUE in all planes of motion with demonstration cueing to improve BUE strength required for BADLs  shoulder:  -flex/ext, horizontal ab/adduct, scaption, int/ext rotation against manual resistance, chest press 1# dowel rod Elbow:  -flex/ext, sup/pronation Wrist:  -flex/ext 1# dowel rod  Pt plays 2 games of connect 4 with omd VC for strategy and VC for use of thumb adduction to turn coins over to pinch and place in game board. Pt drops pieces ~30% of time. Pt manipulates board game blocks flipping over pieces with mod VC for selective attention to task as tv is playing in background. Pt frequently closing eyes and yawning. Exited session with pt seatd in bed, exit alarm on and family in room. Pt missed 15 min skilled OT d/t fatigue. Will follow up as available.  Therapy Documentation Precautions:  Precautions Precautions: Cervical, Fall Precaution Comments: Reviewed cervical precautions Required Braces or Orthoses: Cervical Brace Cervical Brace: Hard collar, At all  times Restrictions Weight Bearing Restrictions: No General:    Therapy/Group: Individual Therapy  Shon HaleStephanie M Normajean Nash 08/24/2018, 1:17 PM

## 2018-08-24 NOTE — Progress Notes (Signed)
[place] PHYSICAL MEDICINE & REHABILITATION PROGRESS NOTE   Subjective/Complaints: No nausea or vomiting no abdominal pain.  He had good bowel movement today  ROS: Limited due to cognitive/behavioral   Objective:   Dg Abd 1 View  Result Date: 08/23/2018 CLINICAL DATA:  Ileus. EXAM: ABDOMEN - 1 VIEW COMPARISON:  Abdominal x-rays from yesterday. FINDINGS: Enteric tube tip in the stomach with the proximal side port at the gastroesophageal junction. Unchanged diffuse large and small bowel distention with air. No acute osseous abnormality. IMPRESSION: 1. Unchanged ileus. 2. Enteric tube with proximal side port at the gastroesophageal junction. Recommend advancement 3-4 cm. Electronically Signed   By: Titus Dubin M.D.   On: 08/23/2018 10:12   Recent Labs    08/21/18 1523 08/23/18 0320  WBC 15.7* 7.4  HGB 13.4 12.1*  HCT 40.4 37.3*  PLT 434* 375   Recent Labs    08/22/18 1455 08/23/18 0320  NA 131* 134*  K 4.6 3.6  CL 97* 102  CO2 19* 21*  GLUCOSE 86 58*  BUN 99* 77*  CREATININE 1.79* 1.17  CALCIUM 8.3* 8.2*    Intake/Output Summary (Last 24 hours) at 08/24/2018 1103 Last data filed at 08/24/2018 0830 Gross per 24 hour  Intake 397.91 ml  Output 1550 ml  Net -1152.09 ml     Physical Exam: Vital Signs Blood pressure (!) 151/62, pulse 90, temperature 97.6 F (36.4 C), temperature source Oral, resp. rate 20, height 6' (1.829 m), weight 103.3 kg, SpO2 100 %.  Constitutional: uncomfortable . Vital signs reviewed. HEENT: EOMI, oral membranes moist Neck: supple Cardiovascular: RRR without murmur. No JVD    Respiratory: CTA Bilaterally without wheezes or rales. Normal effort    GI: , distended, nontender, high-pitched bowel sounds Musculoskeletal:  Lower extremity edema R>L.--unchanged. Right ankle/foot tender still to palpation Neurological: he is alert. Follows basic commands.  Motor: Right upper extremity: 4+/5 proximal distal Left upper extremity: 5/5 proximal  distal Bilateral lower extremities: Does not cooperate well with examination today Sensation intact light touch  Skin: Skin is warm and dry.  Surgical site remains clean and dry  Psychiatric: flat but cooperative   Assessment/Plan: 1. Functional deficits secondary to cervical myelopathy which require 3+ hours per day of interdisciplinary therapy in a comprehensive inpatient rehab setting.  Physiatrist is providing close team supervision and 24 hour management of active medical problems listed below.  Physiatrist and rehab team continue to assess barriers to discharge/monitor patient progress toward functional and medical goals  Care Tool:  Bathing    Body parts bathed by patient: Right upper leg, Left upper leg(LB only with 50% A for upper legs)   Body parts bathed by helper: Right lower leg, Left lower leg     Bathing assist Assist Level: Maximal Assistance - Patient 24 - 49%     Upper Body Dressing/Undressing Upper body dressing   What is the patient wearing?: Pull over shirt    Upper body assist Assist Level: 2 Helpers    Lower Body Dressing/Undressing Lower body dressing      What is the patient wearing?: Pants     Lower body assist Assist for lower body dressing: Dependent - Patient 0%     Toileting Toileting    Toileting assist Assist for toileting: 2 Helpers     Transfers Chair/bed transfer  Transfers assist  Chair/bed transfer activity did not occur: Safety/medical concerns  Chair/bed transfer assist level: 2 Helpers     Locomotion Ambulation   Ambulation assist  Ambulation activity did not occur: Safety/medical concerns          Walk 10 feet activity   Assist  Walk 10 feet activity did not occur: Safety/medical concerns        Walk 50 feet activity   Assist Walk 50 feet with 2 turns activity did not occur: Safety/medical concerns         Walk 150 feet activity   Assist Walk 150 feet activity did not occur:  Safety/medical concerns         Walk 10 feet on uneven surface  activity   Assist Walk 10 feet on uneven surfaces activity did not occur: Safety/medical concerns         Wheelchair     Assist Will patient use wheelchair at discharge?: Yes Type of Wheelchair: Manual Wheelchair activity did not occur: Safety/medical concerns  Wheelchair assist level: Minimal Assistance - Patient > 75% Max wheelchair distance: 25'    Wheelchair 50 feet with 2 turns activity    Assist    Wheelchair 50 feet with 2 turns activity did not occur: Safety/medical concerns       Wheelchair 150 feet activity     Assist Wheelchair 150 feet activity did not occur: Safety/medical concerns         Medical Problem List and Plan: 1.  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 Cervical collar at all times  --bedside therapies today only, continue with bedside therapy 2.  DVT Prophylaxis/Anticoagulation: SCDs. Vascular study negative  -  lovenox 57m q12   3. Pain Management:  Oxycodone and Valium as needed.  -right ankle pain/swelling. Mild OA on XR 4. Mood:  Provide emotional support 5. Neuropsych: This patient is capable of making decisions on his own behalf. 6. Skin/Wound Care:  Routine skin checks 7. Fluids/Electrolytes/Nutrition, acute renal insufficiency:    -Proving with IV hydration    -Change IV fluids to D5 0.45 normal saline +20 KCl.  This will hopefully avoid hypoglycemia  -hold lisinopril and metformin, BUN/creatinine improved 77 and 1.17 Recheck b met in a.m.   8. Diabetes mellitus. Glucophage 1500 mg daily, Actos 15 mg daily, Amaryl 8 mg daily.   -hold glucophage per above, given hypoglycemia will reduce Amaryl to 1 mg 9. Hypertension. Lisinopril 40 mg daily, clonidine 0.1 mg twice a day, Norvasc 10 mg daily.   -hold lisinopril per above 10. Hyperlipidemia. Lipitor 11. Constipation. Laxative assistance 12. Leukocytosis:  15k  yesterday.  Resolved 7.4K on 08/23/2018       -cxr with atelectasis   -might be reactive to potential ileus.   - 13. Colonic ileus:   -Recheck KUB tomorrow, 08/25/2018  --NPO  - NGT to suction with some output of dark brown fluid, low volume may be able to discontinue tomorrow  -IV reglan    -IVF as above   Discontinue Percocet, switch to tramadol given lethargy and constipation  LOS: 5 days A FACE TO FACE EVALUATION WAS PERFORMED  ACharlett Blake12/04/2018, 11:03 AM

## 2018-08-25 ENCOUNTER — Inpatient Hospital Stay (HOSPITAL_COMMUNITY): Payer: Managed Care, Other (non HMO) | Admitting: Occupational Therapy

## 2018-08-25 ENCOUNTER — Inpatient Hospital Stay (HOSPITAL_COMMUNITY): Payer: Managed Care, Other (non HMO) | Admitting: Speech Pathology

## 2018-08-25 ENCOUNTER — Inpatient Hospital Stay (HOSPITAL_COMMUNITY): Payer: Managed Care, Other (non HMO)

## 2018-08-25 ENCOUNTER — Inpatient Hospital Stay (HOSPITAL_COMMUNITY): Payer: Managed Care, Other (non HMO) | Admitting: Physical Therapy

## 2018-08-25 DIAGNOSIS — R52 Pain, unspecified: Secondary | ICD-10-CM

## 2018-08-25 LAB — BASIC METABOLIC PANEL
Anion gap: 9 (ref 5–15)
BUN: 17 mg/dL (ref 8–23)
CO2: 27 mmol/L (ref 22–32)
Calcium: 8.4 mg/dL — ABNORMAL LOW (ref 8.9–10.3)
Chloride: 105 mmol/L (ref 98–111)
Creatinine, Ser: 0.7 mg/dL (ref 0.61–1.24)
GFR calc Af Amer: >60 mL/min (ref >60)
GFR calc non Af Amer: >60 mL/min (ref >60)
Glucose, Bld: 194 mg/dL — ABNORMAL HIGH (ref 70–99)
Potassium: 3.9 mmol/L (ref 3.5–5.1)
Sodium: 141 mmol/L (ref 135–145)

## 2018-08-25 LAB — GLUCOSE, CAPILLARY
Glucose-Capillary: 175 mg/dL — ABNORMAL HIGH (ref 70–99)
Glucose-Capillary: 148 mg/dL — ABNORMAL HIGH (ref 70–99)
Glucose-Capillary: 147 mg/dL — ABNORMAL HIGH (ref 70–99)
Glucose-Capillary: 124 mg/dL — ABNORMAL HIGH (ref 70–99)

## 2018-08-25 NOTE — Care Management (Signed)
[facility]patient [facility] Individual Statement of Services  Patient Name:  Brandon Collins  Date:  08/25/2018  Welcome to the [facility]patient [facility].  Our goal is to provide you with an individualized program based on your diagnosis and situation, designed to meet your specific needs.  With this comprehensive rehabilitation program, you will be expected to participate in at least 3 hours of rehabilitation therapies Monday-Friday, with modified therapy programming on the weekends.  Your rehabilitation program will include the following services:  Physical Therapy (PT), Occupational Therapy (OT), Speech Therapy (ST), 24 hour per day rehabilitation nursing, Therapeutic Recreaction (TR), Neuropsychology, Case Management (Social Worker), Rehabilitation Medicine, Nutrition Services and Pharmacy Services  Weekly team conferences will be held on Tuesdays to discuss your progress.  Your Social Worker will talk with you frequently to get your input and to update you on team discussions.  Team conferences with you and your family in attendance may also be held.  Expected length of stay: 21-24 days   Overall anticipated outcome: minimal assistance @ wheelchair  Depending on your progress and recovery, your program may change. Your Social Worker will coordinate services and will keep you informed of any changes. Your Social Worker's name and contact numbers are listed  below.  The following services may also be recommended but are not provided by the Inpatient Rehabilitation Center:   Driving Evaluations  Home Health Rehabiltiation Services  Outpatient Rehabilitation Services Arrangements will be made to provide these services after discharge if needed.  Arrangements include referral to agencies that provide these services.  Your insurance has been verified to be:  [place], Medicare [place] Your primary doctor is:  [employer] [facility]  Pertinent information will be shared with your doctor and your  insurance company.  Social Worker:  MayfieldLucy Gisell Buehrle, [place] [phone] or ([phone]) [phone]   Information discussed with and copy given to patient by: Amada JupiterHOYLE, Grethel Zenk, 08/25/2018, 3:29 PM

## 2018-08-25 NOTE — Progress Notes (Signed)
[place] PHYSICAL MEDICINE & REHABILITATION PROGRESS NOTE   Subjective/Complaints: Patient pulled out his NG tube early this morning.  Just was reinserted prior to me coming into the room.  Patient states he is a bit more comfortable.  ROS: Limited due to cognitive/behavioral    Objective:   Dg Abd 1 View  Result Date: 08/25/2018 CLINICAL DATA:  NG tube placement EXAM: ABDOMEN - 1 VIEW COMPARISON:  08/23/2018 FINDINGS: NG tube tip is in the mid stomach. Mild gaseous distention of bowel in the upper abdomen. IMPRESSION: NG tube tip in the mid stomach. Electronically Signed   By: Charlett Nose M.D.   On: 08/25/2018 08:57   Recent Labs    08/23/18 0320  WBC 7.4  HGB 12.1*  HCT 37.3*  PLT 375   Recent Labs    08/23/18 0320 08/25/18 0623  NA 134* 141  K 3.6 3.9  CL 102 105  CO2 21* 27  GLUCOSE 58* 194*  BUN 77* 17  CREATININE 1.17 0.70  CALCIUM 8.2* 8.4*    Intake/Output Summary (Last 24 hours) at 08/25/2018 1007 Last data filed at 08/25/2018 0756 Gross per 24 hour  Intake 310 ml  Output 988 ml  Net -678 ml     Physical Exam: Vital Signs Blood pressure (!) 190/80, pulse 91, temperature 98.3 F (36.8 C), resp. rate 17, height 6' (1.829 m), weight 103.3 kg, SpO2 96 %.  Constitutional: No distress . Vital signs reviewed. HEENT: EOMI, oral membranes moist, NGT Neck: supple Cardiovascular: RRR without murmur. No JVD    Respiratory: CTA Bilaterally without wheezes or rales. Normal effort    GI: BS somewhat present, non-tender, less-distended  Musculoskeletal:  Lower extremity edema R>L.--unchanged. Right ankle/foot tender still to palpation Neurological: he is alert. Follows basic commands.  Motor: Right upper extremity: 4+/5 proximal distal Left upper extremity: 5/5 proximal distal Bilateral lower extremities:1+ to 2/5. Sensation intact light touch in both lower legs Skin: Skin is warm and dry.  Surgical site remains clean and dry  Psychiatric: flat but  cooperative   Assessment/Plan: 1. Functional deficits secondary to cervical myelopathy which require 3+ hours per day of interdisciplinary therapy in a comprehensive inpatient rehab setting.  Physiatrist is providing close team supervision and 24 hour management of active medical problems listed below.  Physiatrist and rehab team continue to assess barriers to discharge/monitor patient progress toward functional and medical goals  Care Tool:  Bathing    Body parts bathed by patient: Right upper leg, Left upper leg(LB only with 50% A for upper legs)   Body parts bathed by helper: Right lower leg, Left lower leg     Bathing assist Assist Level: Maximal Assistance - Patient 24 - 49%     Upper Body Dressing/Undressing Upper body dressing   What is the patient wearing?: Pull over shirt    Upper body assist Assist Level: 2 Helpers    Lower Body Dressing/Undressing Lower body dressing      What is the patient wearing?: Pants     Lower body assist Assist for lower body dressing: Dependent - Patient 0%     Toileting Toileting    Toileting assist Assist for toileting: 2 Helpers     Transfers Chair/bed transfer  Transfers assist  Chair/bed transfer activity did not occur: Safety/medical concerns  Chair/bed transfer assist level: 2 Helpers     Locomotion Ambulation   Ambulation assist   Ambulation activity did not occur: Safety/medical concerns  Walk 10 feet activity   Assist  Walk 10 feet activity did not occur: Safety/medical concerns        Walk 50 feet activity   Assist Walk 50 feet with 2 turns activity did not occur: Safety/medical concerns         Walk 150 feet activity   Assist Walk 150 feet activity did not occur: Safety/medical concerns         Walk 10 feet on uneven surface  activity   Assist Walk 10 feet on uneven surfaces activity did not occur: Safety/medical concerns         Wheelchair     Assist  Will patient use wheelchair at discharge?: Yes Type of Wheelchair: Manual Wheelchair activity did not occur: Safety/medical concerns  Wheelchair assist level: Minimal Assistance - Patient > 75% Max wheelchair distance: 25'    Wheelchair 50 feet with 2 turns activity    Assist    Wheelchair 50 feet with 2 turns activity did not occur: Safety/medical concerns       Wheelchair 150 feet activity     Assist Wheelchair 150 feet activity did not occur: Safety/medical concerns         Medical Problem List and Plan: 1.  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 Cervical collar at all times  -full therapies as tolerated  -dc restraints 2.  DVT Prophylaxis/Anticoagulation: SCDs. Vascular study negative  -  lovenox 30mg  q12   3. Pain Management:  Oxycodone and Valium as needed.  -right ankle pain/swelling. Mild OA on XR 4. Mood:  Provide emotional support 5. Neuropsych: This patient is capable of making decisions on his own behalf. 6. Skin/Wound Care:  Routine skin checks 7. Fluids/Electrolytes/Nutrition, acute renal insufficiency:    -DC IV hydration  -holding lisinopril and metformin,    8. Diabetes mellitus. Glucophage 1500 mg daily, Actos 15 mg daily, Amaryl 8 mg daily.   -glucophage held. reduced Amaryl to 1 mg  9. Hypertension. Lisinopril 40 mg daily, clonidine 0.1 mg twice a day, Norvasc 10 mg daily.   -held lisinopril per above 10. Hyperlipidemia. Lipitor 11. Constipation. Laxative assistance 12. Leukocytosis:  Resolved 13. Colonic ileus:   -xr reviewed, shows improvement although not focused entirely on stomac (NG placement)  --clear liquids  -IV reglan   LOS: 6 days A FACE TO FACE EVALUATION WAS PERFORMED  Ranelle OysterZachary T Treyson Axel 08/25/2018, 10:07 AM

## 2018-08-25 NOTE — Progress Notes (Signed)
Spoke with Dr. Riley KillSwartz at rounds and explained that pt's BP has been elevated and blood sugars have been mildly elevated. NO new orders.

## 2018-08-25 NOTE — Consult Note (Signed)
Neuropsychological Consultation   Patient:   Brandon OchsCarl S Collins   DOB:   06-10-1951  MR Number:  [id number]  [facility]  [facility] [address]4W [facility] [address] [id number] [facility] [place] [id number] Dept: [phone] Loc: [phone]           Date of Service:   08/25/2018  Start Time:   11 AM End Time:   12 PM  Provider/Observe calling  Arley PhenixJohn Pranika Finks, Psy.D.       Clinical Neuropsychologist       Billing Code/Service: [phone] 4 Units  Chief Complaint:    Brandon Collins is a 67 year old male with history of hypertension, type 2 diabetes, cervical myelopathy with cord compression and myelomalacia C3-C7 with progressive decline for the past 6 months.  Patient presented 08/12/2018 with new onset of bilateral upper extremity weakness, left lower extremity weakness and balance deficits with noted falls as well as difficulty with basic ADLs.  Patient underwent anterior cervical decompression and fusion C3-4, 4-5, 5-6 and C6-7 as well as placement of anterior instrumentation.  Patient has continued to struggle with weakness and motor function.    Reason for Service:  The patient was referred for neuropsychological consultation due to coping and adjustment issues.  Below is the HPI for the current admission.    HPI: Brandon Collins is a 67 year old right-handed male history of hypertension, type 2 diabetes mellitus, cervical myelopathy with cord compression and myelomalacia C3-C7 with progressive decline for the past 6 months. Per chart review and patient, patient lives with spouse. One level home 3 steps to entry. Independent working up until 2 weeks ago.presented 08/12/2018 with new onset of bilateral upper extremity weakness, left lower extremity weakness and balance deficits with noted falls as well as difficulty with basic ADLs.underwent anterior cervical decompression and fusion C3-4, 4-5, 5-6 and C6-7 as well as placement of anterior  instrumentation per Dr. Yevette Edwardsumonski. Hospital course pain management. Cervical collar at all times. Therapy evaluations completed with recommendations of physical medicine rehabilitation consult. Patient was admitted for a comprehensive rehabilitation program.  Current Status:  Patient was very lethargic and struggling with coughing and weak.  He reports that he has had difficulty with loss of function and residual impact on motor functioning.    Behavioral Observation: Brandon OchsCarl S Reliford  presents as a 67 y.o.-year-old Right Caucasian Male who appeared his stated age. his dress was Appropriate and he was Well Groomed and his manners were Appropriate to the situation.  his participation was indicative of Appropriate, Drowsy and Inattentive behaviors.  There were any physical disabilities noted.  he displayed an appropriate level of cooperation and motivation.     Interactions:    Active Appropriate, Drowsy and Inattentive  Attention:   abnormal and attention span appeared shorter than expected for age  Memory:   abnormal; remote memory intact, recent memory impaired  Visuo-spatial:  not examined  Speech (Volume):  low  Speech:   slurred;   Thought Process:  Coherent and Relevant  Though Content:  WNL; not suicidal and not homicidal  Orientation:   person, place, time/date and situation  Judgment:   Fair  Planning:   Fair  Affect:    Blunted and Lethargic  Mood:    Dysphoric  Insight:   Fair  Intelligence:   normal  Medical History:   Past Medical History:  Diagnosis Date  . Arthritis    "all over" (08/12/2018)  . High cholesterol   .  Hypertension   . Type II diabetes mellitus (HCC)     Psychiatric History:  Patient denied history of depression and anxiety.  Family Med/Psych History:  Family History  Problem Relation Age of Onset  . Cancer Mother   . Stroke Father        multiple    Risk of Suicide/Violence: low Patient denies SI or HI.  Impression/DX:  Brandon Collins is  a 67 year old male with history of hypertension, type 2 diabetes, cervical myelopathy with cord compression and myelomalacia C3-C7 with progressive decline for the past 6 months.  Patient presented 08/12/2018 with new onset of bilateral upper extremity weakness, left lower extremity weakness and balance deficits with noted falls as well as difficulty with basic ADLs.  Patient underwent anterior cervical decompression and fusion C3-4, 4-5, 5-6 and C6-7 as well as placement of anterior instrumentation.  Patient has continued to struggle with weakness and motor function.    Patient was very lethargic and struggling with coughing and weak.  He reports that he has had difficulty with loss of function and residual impact on motor functioning.         Electronically Signed   _______________________ Arley Phenix, Psy.D.

## 2018-08-25 NOTE — Progress Notes (Signed)
MD ordered to D/C NGT after we re-inserted this am. NG tube discontinued as ordered.

## 2018-08-25 NOTE — Progress Notes (Signed)
Occupational Therapy Session Note  Patient Details  Name: Brandon OchsCarl S Gorey MRN: [id number] Date of Birth: 11-06-1950  Today's Date: 08/25/2018 OT Individual Time: [id number] OT Individual Time Calculation (min): 72 min    Short Term Goals: Week 1:  OT Short Term Goal 1 (Week 1): Pt will roll R and L with max +1 to reduce caregiver burden with LB dressing OT Short Term Goal 2 (Week 1): Pt will don shirt with (S) sitting EOB OT Short Term Goal 3 (Week 1): Pt will feed himself entire meal with LRD with CGA OT Short Term Goal 4 (Week 1): Pt will use STEDY with max +2 to transfer to w/c   Skilled Therapeutic Interventions/Progress Updates:    Pt greeted in bed with RN staff present s/p reinsertion of NG tube. He appeared lethargic but agreeable to tx. Supine<sit completed with Mod A with vcs for forward scooting. UB  bathing/dressing completed with steady assist-supervision for balance EOB. HOH for initiation of bathing tasks with step by step cues for sequencing. Max A required for thoroughness. Pt elevated UEs with cuing to don overhead shirt, stating "I'll try" when provided with instruction, and following instruction 80% of time. Able to elevate each LE in gravity minimized position (Lt>Rt) to assist OT with washing LEs. Attempted sit<stand in [place] with 2 assist for perihygiene completion. Pt able to minimally clear buttocks off of bed, but unable to achieve full stand in device. Therefore perihygiene was completed in bed, rolling Rt>Lt with Mod A and manual facilitation for UE/LE placement. Pt initiating reach for bedrail when cued to roll. Able to lift L LE against gravity while donning pants, and rolling Lt>Rt in manner as written above. After consultation with RN, oral care completed by OT using swab sponge and suction. 2 assist for repositioning in bed with HOB set at 30 degrees. Wrist restraints applied bilaterally. Pt left with 4 bedrails up, call bell in lap, and bed alarm set.     Therapy  Documentation Precautions:  Precautions Precautions: Cervical, Fall Precaution Comments: Reviewed cervical precautions Required Braces or Orthoses: Cervical Brace Cervical Brace: Hard collar, At all times Restrictions Weight Bearing Restrictions: No Pain: No s/s pain during tx    ADL:        Therapy/Group: Individual Therapy  Lowanda Cashaw A Azhane Eckart 08/25/2018, 12:04 PM

## 2018-08-25 NOTE — Progress Notes (Signed)
Speech Language Pathology Daily Session Note  Patient Details  Name: Brandon OchsCarl S Axley MRN: [id number] Date of Birth: [birth year]  Today's Date: 08/25/2018 SLP Individual Time: 1400-1445 SLP Individual Time Calculation (min): 45 min  Short Term Goals: Week 1: SLP Short Term Goal 1 (Week 1): STG=LTG due to anticipated short length of stay for ST  Skilled Therapeutic Interventions: Skilled treatment session focused on dysphagia goals. Upon arrival, patient was lethargic while sitting upright in the wheelchair. Patient with a hoarse vocal quality and constant coughing throughout session. Patient declined any interventions to help alleviate coughing like suction or sips of thin liquids despite multiple attempts. RN and PA aware of patient's constant coughing, lethargy and breathing quality and this clinician's concern for his swallowing function. However, patient is currently afebrile, with lungs CTA per RN. Patient falling asleep during session but would respond to questions with intermittent language of confusion. Patient's wife present at end of session and educated on SLP's concerns and current plan of care. She verbalized understanding but also reported she feels his breathing and coughing has been his baseline since his surgery. Patient left upright in wheelchair with alarm on and family present. Continue with current plan of care.      Pain No/Denies Pain   Therapy/Group: Individual Therapy  Shiloh Swopes 08/25/2018, 2:55 PM

## 2018-08-25 NOTE — Progress Notes (Signed)
Physical Therapy Session Note  Patient Details  Name: Brandon OchsCarl S Arizmendi MRN: [id number] Date of Birth: [birth year]  Today's Date: 08/25/2018 PT Individual Time: 1300-1400 PT Individual Time Calculation (min): 60 min   Short Term Goals: Week 1:  PT Short Term Goal 1 (Week 1): Pt will complete bed mobility with max A x 1 consistently PT Short Term Goal 2 (Week 1): Pt will maintain sitting balance with Supervision PT Short Term Goal 3 (Week 1): Pt will tolerate sitting OOB x 1 hour  Skilled Therapeutic Interventions/Progress Updates:    Pt received seated in bed, agreeable to PT. No complaints of pain at rest, but reports onset of low back pain with mobility. Pt declines any intervention for pain. Seated in bed to sitting EOB with max A for BLE management and trunk control. Pt requires max multimodal cueing and increased time to complete transfer. Attempt to attach dycem to bedrails for improved grip but pt becomes confused and tries to peel dycem off and is confused as to purpose, dycem removed. Pt is able to minimally assist with transfer with use of BUE on bedrails to come to a long-sitting position from sitting in bed with back supported. Pt is close SBA for sitting balance EOB with B UE/LE supported. Seated BP 151/72, HR 93. Sliding board transfer bed to w/c with total A x 2 due to pt's fear of leaning forwards and inability to follow cues to assist with transfer. Pt is very lethargic throughout therapy session and needs cues to keep eyes open and stay engaged in session. Pt also coughs constantly throughout therapy session but declines need to use suction. Attempt to have pt perform manual w/c propulsion once seated in w/c. Pt unable to attend to task and reports feeling confused as to purpose of task. Pt oriented x 1, unable to name hospital he is in or correct year/month. Pt reoriented. Pt left seated in w/c in room with needs in reach, quick-release belt and chair alarm in place, telesitter present.  Medical team aware of change in pt's status this date.  Therapy Documentation Precautions:  Precautions Precautions: Cervical, Fall Precaution Comments: Reviewed cervical precautions Required Braces or Orthoses: Cervical Brace Cervical Brace: Hard collar, At all times Restrictions Weight Bearing Restrictions: No   Therapy/Group: Individual Therapy  Peter Congoaylor Anallely Rosell, PT, DPT  08/25/2018, 3:50 PM

## 2018-08-25 NOTE — Progress Notes (Signed)
Patient pulled NGT this am. Per Jesusita Okaan, re-insert NGT.

## 2018-08-26 ENCOUNTER — Inpatient Hospital Stay (HOSPITAL_COMMUNITY): Payer: Managed Care, Other (non HMO) | Admitting: Speech Pathology

## 2018-08-26 ENCOUNTER — Inpatient Hospital Stay (HOSPITAL_COMMUNITY): Payer: Managed Care, Other (non HMO) | Admitting: Occupational Therapy

## 2018-08-26 ENCOUNTER — Inpatient Hospital Stay (HOSPITAL_COMMUNITY): Payer: Managed Care, Other (non HMO)

## 2018-08-26 ENCOUNTER — Inpatient Hospital Stay (HOSPITAL_COMMUNITY): Payer: Managed Care, Other (non HMO) | Admitting: Physical Therapy

## 2018-08-26 LAB — GLUCOSE, CAPILLARY
Glucose-Capillary: 90 mg/dL (ref 70–99)
Glucose-Capillary: 132 mg/dL — ABNORMAL HIGH (ref 70–99)
Glucose-Capillary: 95 mg/dL (ref 70–99)
Glucose-Capillary: 111 mg/dL — ABNORMAL HIGH (ref 70–99)

## 2018-08-26 MED ORDER — DEXTROSE-NACL 5-0.45 % IV SOLN
INTRAVENOUS | Status: DC
  Administered 2018-08-26: 20:00:00 via INTRAVENOUS

## 2018-08-26 MED ORDER — MEGESTROL ACETATE 400 MG/10ML PO SUSP
400.0000 mg | Freq: Two times a day (BID) | ORAL | Status: DC
  Administered 2018-08-26 – 2018-09-18 (×47): 400 mg via ORAL
  Filled 2018-08-26 (×48): qty 10

## 2018-08-26 MED ORDER — BACLOFEN 10 MG PO TABS
10.0000 mg | ORAL_TABLET | Freq: Three times a day (TID) | ORAL | Status: DC | PRN
  Administered 2018-09-03 – 2018-09-16 (×8): 10 mg via ORAL
  Filled 2018-08-26 (×8): qty 1

## 2018-08-26 NOTE — Progress Notes (Signed)
Speech Language Pathology Daily Session Note  Patient Details  Name: Brandon OchsCarl S Collins MRN: [id number] Date of Birth: 14-Dec-1950  Today's Date: 08/26/2018 SLP Individual Time: 1007-1015 SLP Individual Time Calculation (min): 8 min  Short Term Goals: Week 1: SLP Short Term Goal 1 (Week 1): STG=LTG due to anticipated short length of stay for ST  Skilled Therapeutic Interventions:  Pt was seen for skilled ST targeting dysphagia goals.  This was therapist's second attempt to see pt due to earlier refusal to consume POs.  Pt was amenable to drinking a cup of apple juice with significant encouragement from SLP.  He had inconsistent s/s of aspiration at bedside which seemed to increase in frequency and intensity following belching episodes which leads SLP to believe some extent of esophageal involvement, possibly secondary to acute GI issues.  Pt is afebrile and lungs are clear to auscultation per chart review; however, pt has not been eating or drinking at all per nursing.  SLP will continue to follow along for swallowing treatment.  I do not think pt would benefit from MBS at this point given minimal PO intake with concomitant exacerbation of cognitive deficits; however, if pt's intake and mentation improves and he still has the abovementioned deficits I do think MBS will be helpful in determining safest diet consistencies.    Pt was left in wheelchair, handed off to PT.  Continue per current plan of care.    Pain Pain Assessment Pain Scale: 0-10 Pain Score: 0-No pain  Therapy/Group: Individual Therapy  Shrihaan Porzio, Melanee SpryNicole L 08/26/2018, 2:09 PM

## 2018-08-26 NOTE — Progress Notes (Signed)
Occupational Therapy Session Note  Patient Details  Name: Brandon Collins MRN: [id number] Date of Birth: [birth year]  Today's Date: 08/26/2018 OT Individual Time: 1300-1355 OT Individual Time Calculation (min): 55 min    Short Term Goals: Week 1:  OT Short Term Goal 1 (Week 1): Pt will roll R and L with max +1 to reduce caregiver burden with LB dressing OT Short Term Goal 2 (Week 1): Pt will don shirt with (S) sitting EOB OT Short Term Goal 3 (Week 1): Pt will feed himself entire meal with LRD with CGA OT Short Term Goal 4 (Week 1): Pt will use STEDY with max +2 to transfer to w/c   Skilled Therapeutic Interventions/Progress Updates:    Upon entering the room, pt seated in wheelchair sleeping soundly. Pt very lethargic this session and grunts in response to therapist questions. OT propels pt into day room via wheelchair and transfers pt onto tilt in space table with use of maxi move. Pt continues to remain sleeping. BP taken with pt supine to be 160/90 and once pt is tilted forwards he begins waving UEs around in their air, not purposeful movement, pt verbalized he didn't know what he was doing and was unable to follow commands. Pt's BP is 150/101 at 40 degrees and pt verbalizing pain in B LEs with weight going through them. Focus on pursed lip breathing but pt unable to follow commands for task. Pt able to tolerate position for 6 minutes. Pt returned to supine and use of maxi move to return to bed. Pt incontinent of bowel and bladder with max A to roll L <> R to remove sling and brief with total A for hygiene. Pt remaining supine with call bell and all needed items within reach. Bed alarm activated.   Therapy Documentation Precautions:  Precautions Precautions: Cervical, Fall Precaution Comments: Reviewed cervical precautions Required Braces or Orthoses: Cervical Brace Cervical Brace: Hard collar, At all times Restrictions Weight Bearing Restrictions: No   Therapy/Group: Individual  Therapy  [name], Marlo Goodrich P 08/26/2018, 2:00 PM

## 2018-08-26 NOTE — Progress Notes (Signed)
Physical Therapy Session Note  Patient Details  Name: Brandon Collins MRN: [id number] Date of Birth: 07-15-1951  Today's Date: 08/26/2018 PT Individual Time: [id number] PT Individual Time Calculation (min): 60 min   Short Term Goals: Week 1:  PT Short Term Goal 1 (Week 1): Pt will complete bed mobility with max A x 1 consistently PT Short Term Goal 2 (Week 1): Pt will maintain sitting balance with Supervision PT Short Term Goal 3 (Week 1): Pt will tolerate sitting OOB x 1 hour  Skilled Therapeutic Interventions/Progress Updates:    Pt received seated in w/c in room, agreeable to PT. No complaints of pain at rest. Manual w/c propulsion x 100 ft with BUE and Supervision. Sliding board transfer w/c to mat table with CGA. Sitting balance EOM with close SBA. Seated BP 152/82. Sit to stand in [place] x 2 reps. Pt has poor tolerance for standing due to onset of low back pain. Sit to supine max A x 2. Rolling L/R with skilled cueing for hand and LE placement during transfer, max A. Supine to sit with min A x 2. Sliding board transfer mat table to w/c with max A x 2 due to onset of fatigue and inability to follow cues to assist with transfer. Assisted pt back to bed at end of therapy session, max A x 2 for SB transfer. Sit to supine assist x 2. Pt left semi-reclined in bed with needs in reach, bed alarm in place, telesitter present.  Therapy Documentation Precautions:  Precautions Precautions: Cervical, Fall Precaution Comments: Reviewed cervical precautions Required Braces or Orthoses: Cervical Brace Cervical Brace: Hard collar, At all times Restrictions Weight Bearing Restrictions: No   Therapy/Group: Individual Therapy  Peter Congoaylor Abbie Jablon, PT, DPT  08/26/2018, 12:47 PM

## 2018-08-26 NOTE — Progress Notes (Signed)
[place] PHYSICAL MEDICINE & REHABILITATION PROGRESS NOTE   Subjective/Complaints: Up with therapy this morning. Moving a bit better. Doesn't have much appetite. Belly feels better though  ROS: Patient denies fever, rash, sore throat, blurred vision,   cough, shortness of breath or chest pain,  back pain, headache, or mood change.   Objective:   Dg Abd 1 View  Result Date: 08/25/2018 CLINICAL DATA:  NG tube placement EXAM: ABDOMEN - 1 VIEW COMPARISON:  08/23/2018 FINDINGS: NG tube tip is in the mid stomach. Mild gaseous distention of bowel in the upper abdomen. IMPRESSION: NG tube tip in the mid stomach. Electronically Signed   By: Charlett Nose M.D.   On: 08/25/2018 08:57   No results for input(s): WBC, HGB, HCT, PLT in the last 72 hours. Recent Labs    08/25/18 0623  NA 141  K 3.9  CL 105  CO2 27  GLUCOSE 194*  BUN 17  CREATININE 0.70  CALCIUM 8.4*    Intake/Output Summary (Last 24 hours) at 08/26/2018 0909 Last data filed at 08/26/2018 0848 Gross per 24 hour  Intake 160 ml  Output 700 ml  Net -540 ml     Physical Exam: Vital Signs Blood pressure (!) 146/66, pulse 94, temperature 98.1 F (36.7 C), resp. rate 16, height 6' (1.829 m), weight 103.3 kg, SpO2 95 %.  Constitutional: No distress . Vital signs reviewed. HEENT: EOMI, oral membranes moist Neck: supple Cardiovascular: RRR without murmur. No JVD    Respiratory: CTA Bilaterally without wheezes or rales. Normal effort    GI: BS +, non-tender, less distended  Musculoskeletal:  Lower extremity edema R>L.-1+ Neurological: more alert and attentive. dysarthric.  Motor: Right upper extremity: 4+/5 proximal distal Left upper extremity: 5/5 proximal distal Bilateral lower extremities:1+ to 2/5. Sensation intact light touch in both lower legs Skin: Skin is warm and dry.  Surgical site remains clean and dry  Psychiatric: flat but cooperative   Assessment/Plan: 1. Functional deficits secondary to cervical  myelopathy which require 3+ hours per day of interdisciplinary therapy in a comprehensive inpatient rehab setting.  Physiatrist is providing close team supervision and 24 hour management of active medical problems listed below.  Physiatrist and rehab team continue to assess barriers to discharge/monitor patient progress toward functional and medical goals  Care Tool:  Bathing    Body parts bathed by patient: Face   Body parts bathed by helper: Right arm, Left arm, Chest, Abdomen, Front perineal area, Buttocks, Right upper leg, Left upper leg, Right lower leg, Left lower leg(assist for thoroughness )     Bathing assist Assist Level: 2 Helpers     Upper Body Dressing/Undressing Upper body dressing   What is the patient wearing?: Pull over shirt    Upper body assist Assist Level: Maximal Assistance - Patient 25 - 49%(EOB)    Lower Body Dressing/Undressing Lower body dressing      What is the patient wearing?: Pants     Lower body assist Assist for lower body dressing: 2 Helpers     Toileting Toileting    Toileting assist Assist for toileting: Dependent - Patient 0%     Transfers Chair/bed transfer  Transfers assist  Chair/bed transfer activity did not occur: Safety/medical concerns  Chair/bed transfer assist level: 2 Helpers     Locomotion Ambulation   Ambulation assist   Ambulation activity did not occur: Safety/medical concerns          Walk 10 feet activity   Assist  Walk 10  feet activity did not occur: Safety/medical concerns        Walk 50 feet activity   Assist Walk 50 feet with 2 turns activity did not occur: Safety/medical concerns         Walk 150 feet activity   Assist Walk 150 feet activity did not occur: Safety/medical concerns         Walk 10 feet on uneven surface  activity   Assist Walk 10 feet on uneven surfaces activity did not occur: Safety/medical concerns         Wheelchair     Assist Will patient  use wheelchair at discharge?: Yes Type of Wheelchair: Manual Wheelchair activity did not occur: Safety/medical concerns  Wheelchair assist level: Minimal Assistance - Patient > 75% Max wheelchair distance: 25'    Wheelchair 50 feet with 2 turns activity    Assist    Wheelchair 50 feet with 2 turns activity did not occur: Safety/medical concerns       Wheelchair 150 feet activity     Assist Wheelchair 150 feet activity did not occur: Safety/medical concerns         Medical Problem List and Plan: 1.  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 Cervical collar at all times  -Interdisciplinary Team Conference today   2.  DVT Prophylaxis/Anticoagulation: SCDs. Vascular study negative  -  lovenox 30mg  q12   3. Pain Management:  Oxycodone and Valium as needed.  -right ankle pain/swelling. 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 Mood:  Provide emotional support 5. Neuropsych: This patient is capable of making decisions on his own behalf. 6. Skin/Wound Care:  Routine skin checks 7. Fluids/Electrolytes/Nutrition, acute renal insufficiency:    -DC'ed IV hydration  -eating little to nothing---add megace  -may need to resume IVF  -holding lisinopril and metformin,    8. Diabetes mellitus. Glucophage 1500 mg daily, Actos 15 mg daily, Amaryl 8 mg daily.   -glucophage held. reduced Amaryl to 1 mg  9. Hypertension. Lisinopril 40 mg daily, clonidine 0.1 mg twice a day, Norvasc 10 mg daily.   -held lisinopril per above 10. Hyperlipidemia. Lipitor 11. Constipation. Laxative assistance 12. Leukocytosis:  Resolved 13. Colonic ileus:   -xr yesterday with improvement  -IV reglan  -clear liq diet  -repeat KUB tomorrow  -had large bms yesterday morning   LOS: 7 days A FACE TO FACE EVALUATION WAS PERFORMED  Ranelle OysterZachary T Catalaya Garr 08/26/2018, 9:09 AM

## 2018-08-26 NOTE — Progress Notes (Signed)
Social Work Patient ID: Brandon OchsCarl S Lugar, male   DOB: [birth year], 67 y.o.   MRN: [id number]   Have reviewed team conference with pt's wife.  Pt sitting up in w/c, however, eyes closed and does not engage.  Wife aware team anticipates LOS 2-3 weeks.  Discussed concerns of lethargy and ongoing cognitive impairments.  Discussed goals of w/c level, min assist at best.  Wife tearful but does state that they will coordinate 24/7 care at home.  She is "really worried" about his current state of function.  Will continue to follow for support.  Tasia Liz, LCSW

## 2018-08-26 NOTE — Plan of Care (Signed)
  Problem: SCI BOWEL ELIMINATION Goal: RH STG MANAGE BOWEL WITH ASSISTANCE Description STG Manage Bowel with Assistance. Outcome: Progressing Goal: RH STG SCI MANAGE BOWEL WITH MEDICATION WITH ASSISTANCE Description STG SCI Manage bowel with medication with assistance. Outcome: Progressing Goal: RH STG MANAGE BOWEL W/EQUIPMENT W/ASSISTANCE Description STG Manage Bowel With Equipment With Assistance Outcome: Progressing Goal: RH STG SCI MANAGE BOWEL PROGRAM W/ASSIST OR AS APPROPRIATE Description STG SCI Manage bowel program w/assist or as appropriate. Outcome: Progressing Goal: RH OTHER STG BOWEL ELIMINATION GOALS W/ASSIST Description Other STG Bowel Elimination Goals With Assistance. Outcome: Progressing   Problem: SCI BLADDER ELIMINATION Goal: RH STG MANAGE BLADDER WITH ASSISTANCE Description STG Manage Bladder With Assistance Outcome: Progressing Goal: RH STG MANAGE BLADDER WITH MEDICATION WITH ASSISTANCE Description STG Manage Bladder With Medication With Assistance. Outcome: Progressing Goal: RH STG MANAGE BLADDER WITH EQUIPMENT WITH ASSISTANCE Description STG Manage Bladder With Equipment With Assistance Outcome: Progressing Goal: RH STG SCI MANAGE BLADDER PROGRAM W/ASSISTANCE Outcome: Progressing Goal: RH OTHER STG BLADDER ELIMINATION GOALS W/ASSIST Description Other STG Bladder Elimination Goals With Assistance Outcome: Progressing   Problem: RH SKIN INTEGRITY Goal: RH STG SKIN FREE OF INFECTION/BREAKDOWN Outcome: Progressing Goal: RH STG MAINTAIN SKIN INTEGRITY WITH ASSISTANCE Description STG Maintain Skin Integrity With Assistance. Outcome: Progressing Goal: RH STG ABLE TO PERFORM INCISION/WOUND CARE W/ASSISTANCE Description STG Able To Perform Incision/Wound Care With Assistance. Outcome: Progressing   Problem: RH SAFETY Goal: RH STG ADHERE TO SAFETY PRECAUTIONS W/ASSISTANCE/DEVICE Description STG Adhere to Safety Precautions With  Assistance/Device. Outcome: Progressing Goal: RH STG DECREASED RISK OF FALL WITH ASSISTANCE Description STG Decreased Risk of Fall With Assistance. Outcome: Progressing   Problem: RH PAIN MANAGEMENT Goal: RH STG PAIN MANAGED AT OR BELOW PT'S PAIN GOAL Outcome: Progressing   Problem: RH KNOWLEDGE DEFICIT SCI Goal: RH STG INCREASE KNOWLEDGE OF SELF CARE AFTER SCI Outcome: Progressing

## 2018-08-26 NOTE — Patient Care Conference (Signed)
Inpatient RehabilitationTeam Conference and Plan of Care Update Date: 08/26/2018   Time: 2:15 PM    Patient Name: Brandon OchsCarl S Brandhorst      Medical Record Number: [id number]  Date of Birth: 1951-03-12 Sex: Male         Room/Bed: 4W08C/4W08C-01 Payor Info: Payor: [employer] / Plan: CIGNA MANAGED / Product Type: *No Product type* /    Admitting Diagnosis: ACDF with myelopathy  Admit Date/Time:  08/19/2018 11:36 AM Admission Comments: No comment available   Primary Diagnosis:  <principal problem not specified> Principal Problem: <principal problem not specified>  Patient Active Problem List   Diagnosis Date Noted  . Pain   . Cervical myelopathy (HCC) 08/19/2018  . Cervical cord compression with myelopathy (HCC)   . Postoperative pain   . Diabetes mellitus type 2 in nonobese (HCC)   . Essential hypertension   . Dyslipidemia   . Drug induced constipation   . Myelopathy (HCC) 08/12/2018    Expected Discharge Date: Expected Discharge Date: (2-3 weeks)  Team Members Present: Physician leading conference: Dr. Faith RogueZachary Swartz Social Worker Present: Amada JupiterLucy Xandra Laramee, LCSW Nurse Present: Chana Bodeeborah Sharp, RN PT Present: Other (comment)(Taylor Serita Griturkalo, PT) OT Present: Jackquline DenmarkKatie [name], OT SLP Present: Jackalyn LombardNicole Page, SLP PPS Coordinator present : Tora DuckMarie Noel, RN, CRRN;Melissa Greta DoomBowie     Current Status/Progress Goal Weekly Team Focus  Medical   Patient admitted for cervical myelopathy related to chronic cord compression.  Status post ACDF.  Course on rehab's been complicated by significant colonic ileus  Reduce ileus and improve nutritional intake  Treat ileus as above.  Control pain, limit other comorbidities   Bowel/Bladder   Pt continent of bowel and bladder, with periods of incontinence. LBM-08/24/18.  Maintain regular bowel pattern and continence of bowel and bladder.   Assist with toileting needs as needed.    Swallow/Nutrition/ Hydration   clear liquids diet, inconsistent s/s of aspiration at bedside  mod  I   toleration of POs and advancement as cleared by medical team   ADL's   2 helpers for BADLs bedlevel + EOB. 2 helpers for slideboard transfers   Min A overall   NMR, sit<stands, cognitive remediation, general strengthening and endurance, pt education, UE coordination    Mobility   max A bed mobility, max A x 2 SB transfers, min A w/c mobility  min A bed mobility and transfers, setup A w/c mobility  increasing tolerance for therapy sessions, sitting balance, transfers, w/c mobility   Communication             Safety/Cognition/ Behavioral Observations            Pain   Pt complained of back and neck pain this shit. Received ultram and valium this shift. Full effects noted.   Maintain pain level <3/10.   Assess pain every shift and as needed. Address as needed.    Skin   Steristrips to surgical site to anterior neck, no drainage noted. Blanchable redness to right ankle, foam dressing intact.   No further skin impairment, no s/sx of infection.   Assess skin every shift and as needed. Address appropriately.       *See Care Plan and progress notes for long and short-term goals.     Barriers to Discharge  Current Status/Progress Possible Resolutions Date Resolved   Physician    Medical stability        Continue management as outlined in the rehab medical chart      Nursing  PT  Decreased caregiver support;Medical stability;Home environment access/layout                 OT                  SLP                SW                Discharge Planning/Teaching Needs:  Plan to d/c home with wife who can provide intermittent assistance and local daughter can assist as well.  Have not confirmed with wife if they can increase coverage to 24/7.  Teaching to be done closer to d/c.   Team Discussion:  Ileus resolving;  NG out and taking clear liquids.  MD considering IVF as ST does not feel ready for MBS.  incont b/b;  Continue with telesitter for now.  Increased in pain with  LE and when standing.  Poor tolerance and very lethargic all day today.  Plan to change tx schedule to 15/7.  SW to stress to family the recommendation for 24/7 assistance for d/c to confirm they can provide this.  Revisions to Treatment Plan:  Tx schedule changed to 15/7    Continued Need for Acute Rehabilitation Level of Care: The patient requires daily medical management by a physician with specialized training in physical medicine and rehabilitation for the following conditions: Daily direction of a multidisciplinary physical rehabilitation program to ensure safe treatment while eliciting the highest outcome that is of practical value to the patient.: Yes Daily medical management of patient stability for increased activity during participation in an intensive rehabilitation regime.: Yes Daily analysis of laboratory values and/or radiology reports with any subsequent need for medication adjustment of medical intervention for : Neurological problems;Other;Nutritional problems   I attest that I was present, lead the team conference, and concur with the assessment and plan of the team.   Aradhana Gin 08/26/2018, 5:28 PM

## 2018-08-26 NOTE — Progress Notes (Signed)
Occupational Therapy Session Note  Patient Details  Name: Brandon Collins MRN: [id number] Date of Birth: 1951-09-07  Today's Date: 08/26/2018 OT Individual Time: 6759-1638 OT Individual Time Calculation (min): 75 min    Short Term Goals: Week 1:  OT Short Term Goal 1 (Week 1): Pt will roll R and L with max +1 to reduce caregiver burden with LB dressing OT Short Term Goal 2 (Week 1): Pt will don shirt with (S) sitting EOB OT Short Term Goal 3 (Week 1): Pt will feed himself entire meal with LRD with CGA OT Short Term Goal 4 (Week 1): Pt will use STEDY with max +2 to transfer to w/c   Skilled Therapeutic Interventions/Progress Updates:    Session focused on b/d EOB and functional mobility. Pt received long sitting in bed, no c/o pain. Pt requiring suction multiple times d/t coughing up mucus. Pt transitioned to EOB with min A. Pt sat EOB with fair sitting balance, no LOB. Set up to wash UB, min A to don shirt with manual facilitation provided to sequence through donning shirt. Pt able to wash legs sitting EOB, and peri areas bed level by rolling with min A.  Max A to don pants, however pt able to thread L LE through pants with CGA balance support during distal reaching. Once returned to supine pt became increasingly lethargic, requiring max +2 to transfer back to EOB. Once EOB, alertness increased. With 3 muskateers method- pt stood from highly elevated EOB, with max A+2. Pt very fearful re falling. Pt then used slideboard, +2 for safety and equipment management, but only min A lifting A! In w/c pt able to complete oral care with min A. Pt fed himself jello and had significant coughing/throat clearing episode. Consulted with SLP and she advised trialing thickened liquids. Pt took several tablespoons of nector thick water with minimal throat clearing but refused any further d/t taste. Pt was left sitting up in his w/c with chair alarm belt fastened, telesitter on, and all needs met. RN alerted to pt  position.   Pt's BP was 170/80 supine, and 170/73 sitting in w/c- RN alerted.   Therapy Documentation Precautions:  Precautions Precautions: Cervical, Fall Precaution Comments: Reviewed cervical precautions Required Braces or Orthoses: Cervical Brace Cervical Brace: Hard collar, At all times Restrictions Weight Bearing Restrictions: No Pain: Pain Assessment Pain Scale: 0-10 Pain Score: 0-No pain   Therapy/Group: Individual Therapy  Curtis Sites 08/26/2018, 8:46 AM

## 2018-08-27 ENCOUNTER — Inpatient Hospital Stay (HOSPITAL_COMMUNITY): Payer: Managed Care, Other (non HMO) | Admitting: Occupational Therapy

## 2018-08-27 ENCOUNTER — Inpatient Hospital Stay (HOSPITAL_COMMUNITY): Payer: Managed Care, Other (non HMO)

## 2018-08-27 ENCOUNTER — Ambulatory Visit (HOSPITAL_COMMUNITY): Payer: Managed Care, Other (non HMO) | Admitting: Speech Pathology

## 2018-08-27 ENCOUNTER — Inpatient Hospital Stay (HOSPITAL_COMMUNITY): Payer: Managed Care, Other (non HMO) | Admitting: Physical Therapy

## 2018-08-27 LAB — BASIC METABOLIC PANEL
Anion gap: 9 (ref 5–15)
BUN: 9 mg/dL (ref 8–23)
CO2: 28 mmol/L (ref 22–32)
Calcium: 8 mg/dL — ABNORMAL LOW (ref 8.9–10.3)
Chloride: 103 mmol/L (ref 98–111)
Creatinine, Ser: 0.62 mg/dL (ref 0.61–1.24)
GFR calc Af Amer: >60 mL/min (ref >60)
GFR calc non Af Amer: >60 mL/min (ref >60)
Glucose, Bld: 131 mg/dL — ABNORMAL HIGH (ref 70–99)
Potassium: 3 mmol/L — ABNORMAL LOW (ref 3.5–5.1)
Sodium: 140 mmol/L (ref 135–145)

## 2018-08-27 LAB — GLUCOSE, CAPILLARY
Glucose-Capillary: 123 mg/dL — ABNORMAL HIGH (ref 70–99)
Glucose-Capillary: 231 mg/dL — ABNORMAL HIGH (ref 70–99)
Glucose-Capillary: 218 mg/dL — ABNORMAL HIGH (ref 70–99)
Glucose-Capillary: 175 mg/dL — ABNORMAL HIGH (ref 70–99)

## 2018-08-27 LAB — CBC
HCT: 36.1 % — ABNORMAL LOW (ref 39.0–52.0)
Hemoglobin: 11.3 g/dL — ABNORMAL LOW (ref 13.0–17.0)
MCH: 28.7 pg (ref 26.0–34.0)
MCHC: 31.3 g/dL (ref 30.0–36.0)
MCV: 91.6 fL (ref 80.0–100.0)
Platelets: 350 10*3/uL (ref 150–400)
RBC: 3.94 MIL/uL — ABNORMAL LOW (ref 4.22–5.81)
RDW: 12.6 % (ref 11.5–15.5)
WBC: 13.1 10*3/uL — ABNORMAL HIGH (ref 4.0–10.5)
nRBC: 0 % (ref 0.0–0.2)

## 2018-08-27 MED ORDER — BOOST / RESOURCE BREEZE PO LIQD CUSTOM
1.0000 | Freq: Three times a day (TID) | ORAL | Status: DC
  Administered 2018-08-27 – 2018-08-28 (×4): 1 via ORAL

## 2018-08-27 MED ORDER — KCL IN DEXTROSE-NACL 20-5-0.45 MEQ/L-%-% IV SOLN
INTRAVENOUS | Status: DC
  Administered 2018-08-27 – 2018-08-29 (×2): via INTRAVENOUS
  Filled 2018-08-27 (×4): qty 1000

## 2018-08-27 MED ORDER — POTASSIUM CHLORIDE CRYS ER 20 MEQ PO TBCR
20.0000 meq | EXTENDED_RELEASE_TABLET | Freq: Two times a day (BID) | ORAL | Status: DC
  Administered 2018-08-27 – 2018-09-04 (×17): 20 meq via ORAL
  Filled 2018-08-27: qty 1
  Filled 2018-08-27: qty 2
  Filled 2018-08-27 (×16): qty 1

## 2018-08-27 MED ORDER — MUSCLE RUB 10-15 % EX CREA
TOPICAL_CREAM | CUTANEOUS | Status: DC | PRN
  Administered 2018-09-04 – 2018-09-06 (×2): via TOPICAL
  Administered 2018-09-11: 1 via TOPICAL
  Administered 2018-09-16: 21:00:00 via TOPICAL
  Filled 2018-08-27: qty 85

## 2018-08-27 NOTE — Progress Notes (Signed)
Initial Nutrition Assessment  DOCUMENTATION CODES:   Obesity unspecified  INTERVENTION:   - Recommend advancing diet to full liquids if medically appropriate given improved KUB and inadequate nutrition x 6 days  - Recommend obtaining new weight as last recorded weight is from 12/3  - Boost Breeze po TID, each supplement provides 250 kcal and 9 grams of protein  - Once diet advanced, Ensure Enlive po TID, each supplement provides 350 kcal and 20 grams of protein (any flavor but strawberry)  - Once diet advanced, RD to order snacks BID  NUTRITION DIAGNOSIS:   Inadequate oral intake related to altered GI function as evidenced by meal completion < 50%, other (clear liquid diet order x 6 days).  GOAL:   Patient will meet greater than or equal to 90% of their needs  MONITOR:   PO intake, Supplement acceptance, Diet advancement, Skin, Labs, Weight trends  REASON FOR ASSESSMENT:   Malnutrition Screening Tool    ASSESSMENT:   67 year old male with PMH significant for HTN, T2DM, cervical myelopathy with cord compression, and myelomalacia C3-C7 with progressive decline for the past 6 months. Pt presented 07/2618 with new onset BUE weakness,LLE weakness, and balance deficits with noted falls as well as difficulty with basic ADLs. Pt underwent anterior cervical decompression and fusion C3-4, 4-5, 5-6 and C6-7 as well as placement of anterior instrumentation.   12/3 - admitted to rehab 12/5 - KUB consistent with ileus, clear liquid diet, NGT placed by IR but pt pulled out later, NGT replaced in the evening 12/9 - pt pulled NGT, reinserted, later removed  Per MD note this AM, KUB "reviewed and although improved, still with colonic distention." Messaged MD through secure chat regarding diet advancement past clear liquids.  Initially spoke with pt while he was consuming clear liquid lunch meal tray. Pt had completed ~50% of clear liquid tray by end of RD visit but was continuing to eat  from lemon ice pop. Pt endorses having a poor appetite.  Pt with short answers to RD questions, so RD returned later to speak with pt's wife. Pt sleeping throughout second RD visit.  Per pt's wife, pt ate well PTA which included 3 meals daily and snacks. Pt's wife states, "he would pretty much eat just about anything" and especially loves coffee. Pt's wife shares that pt did not have any issues swallowing PTA and that these issues have occurred only after surgery. SLP following. Of note, pt's wife reports pt choked on jello today.  Pt remains on clear liquids since 12/5. Meal completion 0-20% x last 8 recorded meals. Pt's wife reports that the last non-liquid foods that pt consumed were only bites of spaghetti and meatloaf. Concern for acute malnutrition at this point given inadequate nutrition x 6 days.  RD ordered Boost Breeze TID. Noted partially completed Boost Breeze at bedside at time of second visit. Will order Ensure Enlive which has more kcal and protein per supplement once diet is advanced past clear liquids.  Discussed pt's weight history with pt's wife. Pt's wife reports that pt's weight has been stable at 230 lbs throughout his adult life. Weight history in chart is limited as there are no weights prior to November 2019. No new weights since admission.  Medications reviewed and include: cholecalciferol, Megace 400 mg BID, Reglan injection 10 mg q 6 hours, Protonix, K-dur 20 mEq BID, Actos, D5 in NS @ 75 ml/hr  NUTRITION - FOCUSED PHYSICAL EXAM:  Deferred, pt eating lunch at time of first visit and  asleep at time of second visit. Will re-attempt at follow-up.  Diet Order:   Diet Order            Diet clear liquid Room service appropriate? Yes; Fluid consistency: Thin  Diet effective now              EDUCATION NEEDS:   Education needs have been addressed  Skin:  Skin Assessment: Skin Integrity Issues: Stage I: right ankle Incisions: incision  Last BM:  12/10 (medium  type 6)  Height:   Ht Readings from Last 1 Encounters:  08/19/18 6' (1.829 m)    Weight:   Wt Readings from Last 1 Encounters:  08/19/18 103.3 kg    Ideal Body Weight:  80.9 kg  BMI:  Body mass index is 30.89 kg/m.  Estimated Nutritional Needs:   Kcal:  2300-2500  Protein:  115-130 grams  Fluid:  >/= 2.3 L    Earma ReadingKate Jablonski Liam Cammarata, MS, RD, LDN Inpatient Clinical Dietitian Pager: [phone] Weekend/After Hours: [phone]

## 2018-08-27 NOTE — Progress Notes (Signed)
Occupational Therapy Session Note  Patient Details  Name: Brandon OchsCarl S Counce MRN: [id number] Date of Birth: [birth year]  Today's Date: 08/27/2018 OT Individual Time: [id number] OT Individual Time Calculation (min): 45 min    Short Term Goals: Week 1:  OT Short Term Goal 1 (Week 1): Pt will roll R and L with max +1 to reduce caregiver burden with LB dressing OT Short Term Goal 2 (Week 1): Pt will don shirt with (S) sitting EOB OT Short Term Goal 3 (Week 1): Pt will feed himself entire meal with LRD with CGA OT Short Term Goal 4 (Week 1): Pt will use STEDY with max +2 to transfer to w/c   Skilled Therapeutic Interventions/Progress Updates:    Patient supine in bed, c/o knee pain and asking for repositioning.  Repositioned and completed light ROM activity with right LE t/o session for comfort.  Patient is alert today and oriented to self, mo and year.  He does not recall name of hospital or date.   ADL - LB dressing max A completed in LSP, [facility], UB dressing mod A, oral care S and set up seated at edge of bed, eating CS/set up and cues for encouragement to eat  Rolling in bed with min A for R LE due to pain, SSP to/from supine mod A of one Good seated balance at edge of bed, tolerated for 10 minutes with CS, completed UE AROM activity  Returned to supine position at close of session with bed alarm set and call bell in hand  Therapy Documentation Precautions:  Precautions Precautions: Cervical, Fall Precaution Comments: Reviewed cervical precautions Required Braces or Orthoses: Cervical Brace Cervical Brace: Hard collar, At all times Restrictions Weight Bearing Restrictions: No General:   Vital Signs:  Pain: Pain Assessment Pain Scale: 0-10 Pain Score: 5  Pain Type: Chronic pain Pain Location: Knee Pain Orientation: Right Pain Descriptors / Indicators: Sharp Pain Frequency: Intermittent Pain Onset: On-going Patients Stated Pain Goal: 3 Pain Intervention(s): MD  notified (Comment);Repositioned;Elevated extremity Multiple Pain Sites: No   Therapy/Group: Individual Therapy  Barrie LymeStacey A Temitayo Covalt 08/27/2018, 11:18 AM

## 2018-08-27 NOTE — Progress Notes (Signed)
Occupational Therapy Session Note  Patient Details  Name: Geralynn OchsCarl S Malan MRN: [id number] Date of Birth: 1951/08/27  Today's Date: 08/27/2018 OT Individual Time: 1500-1555 OT Individual Time Calculation (min): 55 min    Short Term Goals: Week 1:  OT Short Term Goal 1 (Week 1): Pt will roll R and L with max +1 to reduce caregiver burden with LB dressing OT Short Term Goal 2 (Week 1): Pt will don shirt with (S) sitting EOB OT Short Term Goal 3 (Week 1): Pt will feed himself entire meal with LRD with CGA OT Short Term Goal 4 (Week 1): Pt will use STEDY with max +2 to transfer to w/c   Skilled Therapeutic Interventions/Progress Updates:    Upon entering the room, pt supine in bed with wife present in room. Pt agreeable to OT intervention but does report fatigue this session. Pt rolling L <> R with max A and use of bed rail for donning of pants. Supine >sit with max A log roll. Min A progressing to close supervision for static sitting balance on EOB. Slide board transfer from bed >wheelchair directed by pt with max A. Pt propelled wheelchair 100' with min A for safety. Pt taking seated rest break. Pt seated at table for strengthening and coordination exercises with use of yellow resistive theraputty. Pt needing min - mod cuing for problem solving and sequencing of exercises. Pt assisted back to room and remained in wheelchair with chair alarm donned and wife present in room.   Therapy Documentation Precautions:  Precautions Precautions: Cervical, Fall Precaution Comments: Reviewed cervical precautions Required Braces or Orthoses: Cervical Brace Cervical Brace: Hard collar, At all times Restrictions Weight Bearing Restrictions: No General:   Vital Signs: Therapy Vitals Temp: 98.6 F (37 C) Pulse Rate: 99 Resp: 17 BP: (!) 163/85 Patient Position (if appropriate): Sitting Oxygen Therapy SpO2: 100 % O2 Device: Room Air   Therapy/Group: Individual Therapy  [name], Slyvia Lartigue  P 08/27/2018, 4:27 PM

## 2018-08-27 NOTE — Progress Notes (Signed)
Physical Therapy Weekly Progress Note  Patient Details  Name: Brandon Collins MRN: [id number] Date of Birth: Oct 05, 1950  Beginning of progress report period: August 20, 2018 End of progress report period: August 27, 2018  Today's Date: 08/27/2018 PT Individual Time: 1100-1200 PT Individual Time Calculation (min): 60 min   Patient has met 2 of 3 short term goals. Pt is able to complete bed mobility with max A x 1 to min/mod A x 2 with use of bed functions for assist. Pt has demonstrated inconsistencies with his ability to transfer via sliding board demonstrating anywhere from Supervision level assist to total A x 2 depending on pt alertness level and ability to participate in transfer. Pt is able to maintain sitting balance EOB with close SBA. Pt has tolerated sitting up in chair in room x 1 hour consistently. Pt is able to propel manual w/c up to 150 ft with BUE and min A.  Patient continues to demonstrate the following deficits muscle weakness, abnormal tone, unbalanced muscle activation and decreased coordination, decreased initiation, decreased attention, decreased awareness, decreased problem solving, decreased safety awareness, decreased memory and delayed processing and decreased sitting balance, decreased postural control and decreased balance strategies and therefore will continue to benefit from skilled PT intervention to increase functional independence with mobility.  Patient progressing toward long term goals..  Continue plan of care.  PT Short Term Goals Week 1:  PT Short Term Goal 1 (Week 1): Pt will complete bed mobility with max A x 1 consistently PT Short Term Goal 1 - Progress (Week 1): Progressing toward goal PT Short Term Goal 2 (Week 1): Pt will maintain sitting balance with Supervision PT Short Term Goal 2 - Progress (Week 1): Met PT Short Term Goal 3 (Week 1): Pt will tolerate sitting OOB x 1 hour PT Short Term Goal 3 - Progress (Week 1): Met Week 2:  PT Short Term  Goal 1 (Week 2): Pt will complete bed mobility with max A x 1 consistently PT Short Term Goal 2 (Week 2): Pt will perform least restrictive transfer with assist x 1 consistently PT Short Term Goal 3 (Week 2): Pt will propel manual w/c x 150 ft with min A  Skilled Therapeutic Interventions/Progress Updates:  Pt received seated in bed, reports urge to have a bowel movement. Rolling L/R with max A with use of bedrails for dependent doffing of pants and brief and placement of bedpan. Pt has already had some incontinence of bowel in brief and has some continent liquid bowel in bedpan. Rolling L/R with max A for dependent pericare and donning of new brief and pants. Supine to sit with max A with HOB elevated. Sitting balance EOB with close SBA. Session focus on breaking down steps of sliding board transfer. Sliding board transfer bed to w/c with min A x 1 and max A x 1, max cueing for head/hips relationship and weight shift during transfer. Manual w/c propulsion x 150 ft with BUE and min A for steering. Provided pt with w/c gloves for improved grip with w/c mobility. Sliding board transfer w/c to level mat table with max A x 1 with max multimodal cueing for weight shift. Sliding board transfer mat table to w/c going slightly downhill with mod A. Pt left seated in w/c in room with needs in reach, quick release belt and chair alarm in place, telesitter present. Pt is setup A for lunch.  Therapy Documentation Precautions:  Precautions Precautions: Cervical, Fall Precaution Comments: Reviewed cervical precautions Required  Braces or Orthoses: Cervical Brace Cervical Brace: Hard collar, At all times Restrictions Weight Bearing Restrictions: No   Therapy/Group: Individual Therapy   Excell Seltzer, PT, DPT  08/27/2018, 12:14 PM

## 2018-08-27 NOTE — Progress Notes (Signed)
[place] PHYSICAL MEDICINE & REHABILITATION PROGRESS NOTE   Subjective/Complaints: Lying in bed. Says he's feeling better. Still has no appetite. Fatigues with therapy  ROS: Limited due to cognitive/behavioral    Objective:   No results found. Recent Labs    08/27/18 0435  WBC 13.1*  HGB 11.3*  HCT 36.1*  PLT 350   Recent Labs    08/25/18 0623 08/27/18 0435  NA 141 140  K 3.9 3.0*  CL 105 103  CO2 27 28  GLUCOSE 194* 131*  BUN 17 9  CREATININE 0.70 0.62  CALCIUM 8.4* 8.0*    Intake/Output Summary (Last 24 hours) at 08/27/2018 0918 Last data filed at 08/27/2018 0837 Gross per 24 hour  Intake 595.18 ml  Output 350 ml  Net 245.18 ml     Physical Exam: Vital Signs Blood pressure (!) 150/67, pulse 79, temperature 97.9 F (36.6 C), resp. rate 18, height 6' (1.829 m), weight 103.3 kg, SpO2 96 %.  Constitutional: No distress . Vital signs reviewed. HEENT: EOMI, oral membranes moist Neck: supple Cardiovascular: RRR without murmur. No JVD    Respiratory: CTA Bilaterally without wheezes or rales. Normal effort    GI: BS +, non-tender, less distended  Musculoskeletal:  Lower extremity edema R>L.-1+ Neurological: more alert and attentive. Remains dysarthric. Improving insight Motor: Right upper extremity: 4+/5 proximal distal Left upper extremity: 5/5 proximal distal Bilateral lower extremities:1+ to 2/5. Sensation intact light touch in both lower legs Skin: Skin is warm and dry.  Surgical site  clean and dry  Psychiatric: flat     Assessment/Plan: 1. Functional deficits secondary to cervical myelopathy which require 3+ hours per day of interdisciplinary therapy in a comprehensive inpatient rehab setting.  Physiatrist is providing close team supervision and 24 hour management of active medical problems listed below.  Physiatrist and rehab team continue to assess barriers to discharge/monitor patient progress toward functional and medical goals  Care  Tool:  Bathing    Body parts bathed by patient: Right arm, Left arm, Chest, Front perineal area, Abdomen, Buttocks, Right upper leg, Left upper leg, Right lower leg, Left lower leg, Face   Body parts bathed by helper: Right arm, Left arm, Chest, Abdomen, Front perineal area, Buttocks, Right upper leg, Left upper leg, Right lower leg, Left lower leg(assist for thoroughness )     Bathing assist Assist Level: Minimal Assistance - Patient > 75%     Upper Body Dressing/Undressing Upper body dressing   What is the patient wearing?: Pull over shirt    Upper body assist Assist Level: Minimal Assistance - Patient > 75%    Lower Body Dressing/Undressing Lower body dressing      What is the patient wearing?: Pants     Lower body assist Assist for lower body dressing: Maximal Assistance - Patient 25 - 49%     Toileting Toileting    Toileting assist Assist for toileting: Dependent - Patient 0%     Transfers Chair/bed transfer  Transfers assist  Chair/bed transfer activity did not occur: Safety/medical concerns  Chair/bed transfer assist level: 2 Helpers     Locomotion Ambulation   Ambulation assist   Ambulation activity did not occur: Safety/medical concerns          Walk 10 feet activity   Assist  Walk 10 feet activity did not occur: Safety/medical concerns        Walk 50 feet activity   Assist Walk 50 feet with 2 turns activity did not occur: Safety/medical concerns  Walk 150 feet activity   Assist Walk 150 feet activity did not occur: Safety/medical concerns         Walk 10 feet on uneven surface  activity   Assist Walk 10 feet on uneven surfaces activity did not occur: Safety/medical concerns         Wheelchair     Assist Will patient use wheelchair at discharge?: Yes Type of Wheelchair: Manual Wheelchair activity did not occur: Safety/medical concerns  Wheelchair assist level: Minimal Assistance - Patient > 75% Max  wheelchair distance: 100'    Wheelchair 50 feet with 2 turns activity    Assist    Wheelchair 50 feet with 2 turns activity did not occur: Safety/medical concerns   Assist Level: Minimal Assistance - Patient > 75%   Wheelchair 150 feet activity     Assist Wheelchair 150 feet activity did not occur: Safety/medical concerns         Medical Problem List and Plan: 1.  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 Cervical collar at all times  -Continue CIR therapies including PT, OT    2.  DVT Prophylaxis/Anticoagulation: SCDs. Vascular study negative  -  lovenox 30mg  q12   3. Pain Management:  Oxycodone and Valium as needed.  -right ankle pain/swelling. 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 Mood:  Provide emotional support 5. Neuropsych: This patient is capable of making decisions on his own behalf. 6. Skin/Wound Care:  Routine skin checks 7. Fluids/Electrolytes/Nutrition, acute renal insufficiency:    -resumed IVF, add 20 meq K+ for potassium of 3.0  -added megace  -encouraged pt to eat  -holding lisinopril and metformin,    8. Diabetes mellitus. Glucophage 1500 mg daily, Actos 15 mg daily, Amaryl 8 mg daily.   -glucophage held. reduced Amaryl to 1 mg  9. Hypertension. Lisinopril 40 mg daily, clonidine 0.1 mg twice a day, Norvasc 10 mg daily.   -held lisinopril per above  -no changes today 10. Hyperlipidemia. Lipitor 11. Constipation. Laxative assistance 12. Leukocytosis:  Resolved 13. Colonic ileus:   -xr reviewed and although improved, still with colonic distention  -IV reglan  -clear liq diet  -continue OOB  -supp K+   LOS: 8 days A FACE TO FACE EVALUATION WAS PERFORMED  Ranelle Oyster 08/27/2018, 9:18 AM

## 2018-08-27 NOTE — Progress Notes (Signed)
    Patient S/P 4 level ACDF 08/12/2018 for spinal cord compression and myelopathy. He is not approx 2 weeks PO and in CIR with slow progress complicated by ilius and diminished PO intake with swallowing difficulties. He denies neck pain and id wearing his hard collar though a bit loose.   Physical Exam: Vitals:   08/26/18 2100 08/27/18 0414  BP: (!) 175/84 (!) 150/67  Pulse: 82 79  Resp: 17 18  Temp: 98.3 F (36.8 C) 97.9 F (36.6 C)  SpO2: 97% 96%    Dressing in place, removed without difficulty, steri strips removed, incision healing well, no sign infection NVI  2 weeks PO ACDF for myelopathy   - cont CIR and PT/OT as needed - Percocet for pain, Valium for muscle spasms PRN,   -can decrease to Tylenol and Robaxin ASAP to minimize side effects  - Pt can shower normally over incision with shower collar, no need to keep covered - Pt has some ongoing swallowing difficulties not unexpected after multilevel ACDF  - OK to provide 6 day 4mg  MDP PO or Decadron IV 1-2 doses to benefit swallowing and aid in reducing swelling if primary team OK with this given other pathologies, this may aid his swallowing substantially    - Continue hard collar until F/U in office at 6 weeks PO

## 2018-08-27 NOTE — Progress Notes (Signed)
Speech Language Pathology Daily Session Note  Patient Details  Name: Geralynn OchsCarl S Norsworthy MRN: [id number] Date of Birth: 06-20-1951  Today's Date: 08/27/2018 SLP Individual Time: 0730-0800 SLP Individual Time Calculation (min): 30 min  Short Term Goals: Week 1: SLP Short Term Goal 1 (Week 1): STG=LTG due to anticipated short length of stay for ST  Skilled Therapeutic Interventions:  Skilled treatment session focused on dysphagia goals. SLP facilitated session by providing skilled observation of pt consuming thin liquids via straw as well as medicine whole with thin liquids. Pt was alert and able to self-feed. Pt with gulping appearance during swallow initiation with delayed cough. Pt consumed 12 oz thin liquids with medicine and stated that he was full. Continue to monitor for increased alertness and increased consumption. Pt left in bed with nursing present.      Pain Pain Assessment Pain Scale: 0-10 Pain Score: 0-No pain  Therapy/Group: Individual Therapy  Kadan Millstein 08/27/2018, 9:12 AM

## 2018-08-28 ENCOUNTER — Inpatient Hospital Stay (HOSPITAL_COMMUNITY): Payer: Managed Care, Other (non HMO)

## 2018-08-28 ENCOUNTER — Inpatient Hospital Stay (HOSPITAL_COMMUNITY): Payer: Managed Care, Other (non HMO) | Admitting: Occupational Therapy

## 2018-08-28 DIAGNOSIS — N319 Neuromuscular dysfunction of bladder, unspecified: Secondary | ICD-10-CM

## 2018-08-28 LAB — BASIC METABOLIC PANEL
Anion gap: 8 (ref 5–15)
BUN: 6 mg/dL — ABNORMAL LOW (ref 8–23)
CO2: 27 mmol/L (ref 22–32)
Calcium: 7.4 mg/dL — ABNORMAL LOW (ref 8.9–10.3)
Chloride: 106 mmol/L (ref 98–111)
Creatinine, Ser: 0.65 mg/dL (ref 0.61–1.24)
GFR calc Af Amer: >60 mL/min (ref >60)
GFR calc non Af Amer: >60 mL/min (ref >60)
Glucose, Bld: 149 mg/dL — ABNORMAL HIGH (ref 70–99)
Potassium: 3.1 mmol/L — ABNORMAL LOW (ref 3.5–5.1)
Sodium: 141 mmol/L (ref 135–145)

## 2018-08-28 LAB — GLUCOSE, CAPILLARY
Glucose-Capillary: 126 mg/dL — ABNORMAL HIGH (ref 70–99)
Glucose-Capillary: 148 mg/dL — ABNORMAL HIGH (ref 70–99)

## 2018-08-28 LAB — MAGNESIUM: Magnesium: 1.3 mg/dL — ABNORMAL LOW (ref 1.7–2.4)

## 2018-08-28 MED ORDER — BOOST / RESOURCE BREEZE PO LIQD CUSTOM: 1.0000 | Freq: Three times a day (TID) | ORAL | Status: DC | PRN

## 2018-08-28 MED ORDER — ENSURE ENLIVE PO LIQD
237.0000 mL | Freq: Three times a day (TID) | ORAL | Status: DC
  Administered 2018-08-28 – 2018-09-18 (×52): 237 mL via ORAL
  Filled 2018-08-28: qty 237

## 2018-08-28 NOTE — Progress Notes (Signed)
Physical Therapy Session Note  Patient Details  Name: Brandon Collins MRN: [id number] Date of Birth: 06/28/1951  Today's Date: 08/28/2018 PT Individual Time: 1030-1135 PT Individual Time Calculation (min): 65 min   Short Term Goals:  Week 2:  PT Short Term Goal 1 (Week 2): Pt will complete bed mobility with max A x 1 consistently PT Short Term Goal 2 (Week 2): Pt will perform least restrictive transfer with assist x 1 consistently PT Short Term Goal 3 (Week 2): Pt will propel manual w/c x 150 ft with min A  Skilled Therapeutic Interventions/Progress Updates:   Pt supine in bed; Amil AmenJulia finishing up hygiene and clothing change with pt due to incontinent B and B.  Cervical collar already donned, but front pad not in place; PT added it.  Supine> sit with max assist and bed features.  Sitting balance activity, reaching slightly out of BOS R/L/ forward with R/L hands, to retrieve hygiene items, x 10, with stand by assistance for balance.  Pt reluctant to test his lmits of balance.  Slide board transfer: pt able to state 80% of steps sequenctually, forgetting about flipping armrest out of the way and head/hips relationship.  Pt performed level transfer with max assist.  Pt donned gloves independently.  W/c propulsion over level tile with supervision, x 200' using bil UEs.  Cues for brakes for safety when stopping.  LLE neuro re-ed via demo and multimodal cues for L long arc quad knee extension with ankle pumps at end range; attempted R long arc knee extension and ankle pumps; trace movement of ankle DF.   Pt left resting in w/c with needs at hand and seat belt alarm set.  Extra time throughout session for initiation, processing.     Therapy Documentation Precautions:  Precautions Precautions: Cervical, Fall Precaution Comments: Reviewed cervical precautions Required Braces or Orthoses: Cervical Brace Cervical Brace: Hard collar, At all times Restrictions Weight Bearing Restrictions:  No  Pain: 6/10 RLE, sensitive to touch; premedicated      Therapy/Group: Individual Therapy  Alonso Gapinski 08/28/2018, 12:14 PM

## 2018-08-28 NOTE — Progress Notes (Signed)
Occupational Therapy Weekly Progress Note  Patient Details  Name: Brandon Collins MRN: [id number] Date of Birth: April 23, 1951  Beginning of progress report period: August 20, 2018 End of progress report period: August 28, 2018  Today's Date: 08/28/2018 OT Individual Time: 2831-5176 OT Individual Time Calculation (min): 15 min  and Today's Date: 08/28/2018 OT Missed Time: 26 Minutes Missed Time Reason: Patient fatigue   Patient has met 1 of 4 short term goals. Pt making slow progress towards goals this week. Pt very inconsistent with functional transfers depending on level of fatigue with varying levels of S - total  Of 2. Pt continues to have increased pain in LEs with movement and when placed into standing with use of equipment. Pt is directing transfers and increasing communication for safety. Self care tasks have been performed from bed level as well as at sink for UB tasks. Pt rolling L <> R with max A.   Patient continues to demonstrate the following deficits: muscle weakness, decreased cardiorespiratoy endurance, decreased coordination and decreased motor planning, decreased problem solving, decreased safety awareness and delayed processing and decreased sitting balance and decreased balance strategies and therefore will continue to benefit from skilled OT intervention to enhance overall performance with BADL.  Patient progressing toward long term goals..  Continue plan of care.  OT Short Term Goals Week 1:  OT Short Term Goal 1 (Week 1): Pt will roll R and L with max +1 to reduce caregiver burden with LB dressing OT Short Term Goal 1 - Progress (Week 1): Met OT Short Term Goal 2 (Week 1): Pt will don shirt with (S) sitting EOB OT Short Term Goal 2 - Progress (Week 1): Not met OT Short Term Goal 3 (Week 1): Pt will feed himself entire meal with LRD with CGA OT Short Term Goal 3 - Progress (Week 1): Progressing toward goal OT Short Term Goal 4 (Week 1): Pt will use STEDY with max +2 to  transfer to w/c  OT Short Term Goal 4 - Progress (Week 1): Not met Week 2:  OT Short Term Goal 1 (Week 2): Pt will maintain sitting balance for 5 minutes at mod A or better.  OT Short Term Goal 2 (Week 2): Pt will engaged in functional task for 10 minutes before needing rest break secondary to fatigue.  OT Short Term Goal 3 (Week 2): Pt will perform UB dressing with min A sitting on EOB.  OT Short Term Goal 4 (Week 2): Pt will be able to utilize STEDY with max A of 2 for shower or toilet transfer.   Skilled Therapeutic Interventions/Progress Updates:    Upon entering the room, pt supine in bed and lethargic. OT attempting to wake pt and he only mumbles but does not open eyes or hold conversation with therapist. RN notified. Pt has not had lunch. OT attempting to try and alert pt so he could eat but unable to do so. Bed lowered and bed alarm activated for safety. Call bell and all needed items within reach.   Therapy Documentation Precautions:  Precautions Precautions: Cervical, Fall Precaution Comments: Reviewed cervical precautions Required Braces or Orthoses: Cervical Brace Cervical Brace: Hard collar, At all times Restrictions Weight Bearing Restrictions: No General: General OT Amount of Missed Time: 45 Minutes  Pain: Pain Assessment Pain Score: 7  Pain Type: Chronic pain Pain Location: Knee Pain Orientation: Right Pain Descriptors / Indicators: Sharp Pain Onset: With Activity   Therapy/Group: Individual Therapy  Gypsy Decant 08/28/2018, 1:23 PM

## 2018-08-28 NOTE — Progress Notes (Signed)
[facility] PHYSICAL MEDICINE & REHABILITATION PROGRESS NOTE   Subjective/Complaints: Making progress. Eating a little bit. BM this morning  ROS: Limited due to cognitive/behavioral    Objective:   Dg Abd 1 View  Result Date: 08/27/2018 CLINICAL DATA:  Follow-up ileus EXAM: ABDOMEN - 1 VIEW COMPARISON:  08/25/2018, 08/23/2018 FINDINGS: Nasogastric catheter is not well visualized. Scattered large and small bowel gas is noted. The degree of gaseous distension appears slightly decreased when compare with the prior exam of 08/23/18. No free air is noted. Degenerative changes of lumbar spine are seen. Stable right renal calculus is noted. IMPRESSION: Persistent but slightly improved ileus when compare with the prior exams. Electronically Signed   By: Alcide Clever M.D.   On: 08/27/2018 09:51   Recent Labs    08/27/18 0435  WBC 13.1*  HGB 11.3*  HCT 36.1*  PLT 350   Recent Labs    08/27/18 0435 08/28/18 0425  NA 140 141  K 3.0* 3.1*  CL 103 106  CO2 28 27  GLUCOSE 131* 149*  BUN 9 6*  CREATININE 0.62 0.65  CALCIUM 8.0* 7.4*    Intake/Output Summary (Last 24 hours) at 08/28/2018 1220 Last data filed at 08/28/2018 0800 Gross per 24 hour  Intake 459.61 ml  Output -  Net 459.61 ml     Physical Exam: Vital Signs Blood pressure (!) 173/78, pulse 80, temperature 98.3 F (36.8 C), temperature source Oral, resp. rate 20, height 6' (1.829 m), weight 103.3 kg, SpO2 98 %.  Constitutional: No distress . Vital signs reviewed. HEENT: EOMI, oral membranes moist Neck: supple Cardiovascular: RRR without murmur. No JVD    Respiratory: CTA Bilaterally without wheezes or rales. Normal effort    GI: BS +, non-tender, non-distended  Musculoskeletal:  Lower extremity edema R>L.-1+ Neurological: more alert and attentive. Remains dysarthric. Improving insight Motor: Right upper extremity: 4+/5 proximal distal Left upper extremity: 5/5 proximal distal Bilateral lower extremities:1+ to  2/5. Sensation intact light touch no change Skin: Skin is warm and dry.  Surgical site  clean and dry  Psychiatric: flat     Assessment/Plan: 1. Functional deficits secondary to cervical myelopathy which require 3+ hours per day of interdisciplinary therapy in a comprehensive inpatient rehab setting.  Physiatrist is providing close team supervision and 24 hour management of active medical problems listed below.  Physiatrist and rehab team continue to assess barriers to discharge/monitor patient progress toward functional and medical goals  Care Tool:  Bathing    Body parts bathed by patient: Right arm, Left arm, Chest, Front perineal area, Abdomen, Buttocks, Right upper leg, Left upper leg, Right lower leg, Left lower leg, Face   Body parts bathed by helper: Right arm, Left arm, Chest, Abdomen, Front perineal area, Buttocks, Right upper leg, Left upper leg, Right lower leg, Left lower leg(assist for thoroughness )     Bathing assist Assist Level: Minimal Assistance - Patient > 75%     Upper Body Dressing/Undressing Upper body dressing   What is the patient wearing?: Pull over shirt    Upper body assist Assist Level: Moderate Assistance - Patient 50 - 74%    Lower Body Dressing/Undressing Lower body dressing      What is the patient wearing?: Pants     Lower body assist Assist for lower body dressing: Maximal Assistance - Patient 25 - 49%     Toileting Toileting    Toileting assist Assist for toileting: Dependent - Patient 0%     Transfers Chair/bed  transfer  Transfers assist  Chair/bed transfer activity did not occur: Safety/medical concerns  Chair/bed transfer assist level: Maximal Assistance - Patient 25 - 49%     Locomotion Ambulation   Ambulation assist   Ambulation activity did not occur: Safety/medical concerns          Walk 10 feet activity   Assist  Walk 10 feet activity did not occur: Safety/medical concerns        Walk 50 feet  activity   Assist Walk 50 feet with 2 turns activity did not occur: Safety/medical concerns         Walk 150 feet activity   Assist Walk 150 feet activity did not occur: Safety/medical concerns         Walk 10 feet on uneven surface  activity   Assist Walk 10 feet on uneven surfaces activity did not occur: Safety/medical concerns         Wheelchair     Assist Will patient use wheelchair at discharge?: Yes Type of Wheelchair: Manual Wheelchair activity did not occur: Safety/medical concerns  Wheelchair assist level: Minimal Assistance - Patient > 75% Max wheelchair distance: 200    Wheelchair 50 feet with 2 turns activity    Assist    Wheelchair 50 feet with 2 turns activity did not occur: Safety/medical concerns   Assist Level: Supervision/Verbal cueing   Wheelchair 150 feet activity     Assist Wheelchair 150 feet activity did not occur: Safety/medical concerns   Assist Level: Supervision/Verbal cueing     Medical Problem List and Plan: 1.  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 Cervical collar at all times  -Continue CIR therapies including PT, OT    2.  DVT Prophylaxis/Anticoagulation: SCDs. Vascular study negative  -  lovenox 30mg  q12   3. Pain Management:  Oxycodone and Valium as needed.  -right ankle pain/swelling. 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 Mood:  Provide emotional support 5. Neuropsych: This patient is capable of making decisions on his own behalf. 6. Skin/Wound Care:  Routine skin checks 7. Fluids/Electrolytes/Nutrition, acute renal insufficiency:    -continue VF, with 20 meq K+ for potassium of 3.0  -added megace  -ate more yesterday  -advance diet  -holding lisinopril and metformin,    8. Diabetes mellitus. Glucophage 1500 mg daily, Actos 15 mg daily, Amaryl 8 mg daily.   -glucophage held. reduced Amaryl to 1 mg  9. Hypertension. Lisinopril 40 mg daily, clonidine 0.1 mg  twice a day, Norvasc 10 mg daily.   -held lisinopril per above  -no changes today 10. Hyperlipidemia. Lipitor 11. Constipation. Laxative assistance 12. Leukocytosis:  Resolved 13. Colonic ileus:   -belly improved.   -continue IV reglan  -adv to full lqiuids  -continue OOB  -supp K+   LOS: 9 days A FACE TO FACE EVALUATION WAS PERFORMED  Ranelle OysterZachary T Swartz 08/28/2018, 12:20 PM

## 2018-08-28 NOTE — Progress Notes (Signed)
Occupational Therapy Session Note  Patient Details  Name: Brandon OchsCarl S Collins MRN: [id number] Date of Birth: June 12, 1951  Today's Date: 08/28/2018 OT Individual Time: 1000-1030 OT Individual Time Calculation (min): 30 min    Short Term Goals: Week 1:  OT Short Term Goal 1 (Week 1): Pt will roll R and L with max +1 to reduce caregiver burden with LB dressing OT Short Term Goal 2 (Week 1): Pt will don shirt with (S) sitting EOB OT Short Term Goal 3 (Week 1): Pt will feed himself entire meal with LRD with CGA OT Short Term Goal 4 (Week 1): Pt will use STEDY with max +2 to transfer to w/c      Skilled Therapeutic Interventions/Progress Updates:    Pt received in bed and his brief was soaked.  The treatment involved pt working on numerous rolls L and R using his arms to A to remove wet brief/ pad, cleanse thoroughly, replace brief, don TEDs and pants. Pt rolling with mod A to R and max to L as he does not tolerate movement of R leg well. Pt alert and engaging in conversation.   Pt's next therapist arrived.  Therapy Documentation Precautions:  Precautions Precautions: Cervical, Fall Precaution Comments: Reviewed cervical precautions Required Braces or Orthoses: Cervical Brace Cervical Brace: Hard collar, At all times Restrictions Weight Bearing Restrictions: No    Vital Signs: Therapy Vitals Pulse Rate: 80 Resp: 20 BP: (!) 173/78 Patient Position (if appropriate): Lying Oxygen Therapy SpO2: 98 % O2 Device: Room Air Pain: Pain Assessment Pain Score: 7  Pain Type: Chronic pain Pain Location: Knee Pain Orientation: Right Pain Descriptors / Indicators: Sharp Pain Onset: With Activity   Therapy/Group: Individual Therapy  Elchonon Maxson 08/28/2018, 12:18 PM

## 2018-08-28 NOTE — Progress Notes (Signed)
Physical Therapy Session Note  Patient Details  Name: Brandon Collins MRN: [id number] Date of Birth: [birth year]  Today's Date: 08/28/2018 PT Individual Time: 9641-8937 PT Individual Time Calculation (min): 55 min   Short Term Goals: Week 1:  PT Short Term Goal 1 (Week 1): Pt will complete bed mobility with max A x 1 consistently PT Short Term Goal 1 - Progress (Week 1): Progressing toward goal PT Short Term Goal 2 (Week 1): Pt will maintain sitting balance with Supervision PT Short Term Goal 2 - Progress (Week 1): Met PT Short Term Goal 3 (Week 1): Pt will tolerate sitting OOB x 1 hour PT Short Term Goal 3 - Progress (Week 1): Met  Skilled Therapeutic Interventions/Progress Updates:   Pt resting in bed, awake. Cervical collar donned, but front pad not included.  PT added it.  Using bed features, supine> sit with mod/max assist.  Pt able to move LLE laterally on bed, and able to move RLE with assistance.  Max cues for sequencing.  Pt sat EOB with bil feet supported, x 3 minutes.  C/o slight dizziness, resolving in a couple of minutes.  L/R lateral leans x 5 each, with max cueing.  Slide board transfer with +2 to place board, otherwise mod assist of 1.  (Pt is using a long slide board which is difficult for 1 person to place under him. )  neuromuscular re-education via forced use, multimodal cues for alternating reciprocal movement bil LEs using Kinetron without resistance, seated in w/c, x 25 cycles x 2 targeting quadriceps muslces and x 25 x 1 targeting gluteal muscles in slight trunk flexion.   W/c propulsion on level tile using bil UEs x 30', weaving in/out of cone obstacle course, encountering 4/8 cones.  Pt needs assistance to roll one rim of w/c backwards while moving the other forward, in order to make tight turns.  Pt left resting in w/c with needs at hand, seat belt alarm set and wife in attendance.     Therapy Documentation Precautions:  Precautions Precautions: Cervical,  Fall Precaution Comments: Reviewed cervical precautions Required Braces or Orthoses: Cervical Brace Cervical Brace: Hard collar, At all times Restrictions Weight Bearing Restrictions: No  Pain: pt denies      Therapy/Group: Individual Therapy  Zan Orlick 08/28/2018, 4:54 PM

## 2018-08-28 NOTE — Plan of Care (Signed)
  Problem: RH SAFETY Goal: RH STG ADHERE TO SAFETY PRECAUTIONS W/ASSISTANCE/DEVICE Description STG Adhere to Safety Precautions With Assistance/Device. Outcome: Progressing  Call light within reach, bed/chair alarm,

## 2018-08-28 NOTE — Progress Notes (Signed)
Occupational Therapy Session Note  Patient Details  Name: Brandon Collins MRN: [id number] Date of Birth: 04/26/1951  Today's Date: 08/28/2018 OT Individual Time: 0933-0959 OT Individual Time Calculation (min): 26 min    Short Term Goals: Week 1:  OT Short Term Goal 1 (Week 1): Pt will roll R and L with max +1 to reduce caregiver burden with LB dressing OT Short Term Goal 2 (Week 1): Pt will don shirt with (S) sitting EOB OT Short Term Goal 3 (Week 1): Pt will feed himself entire meal with LRD with CGA OT Short Term Goal 4 (Week 1): Pt will use STEDY with max +2 to transfer to w/c   Skilled Therapeutic Interventions/Progress Updates:    Pt greeted semi-reclined in bed and agreeable to OT treatment session. Started to work on LB dressing, then noted ptt incontinent of bladder and smear of BM, soaked through brief onto bed. Max A rolling L and R for brief change and hygiene. Pt able to assist with washing between legs with HOB elevated. Pt left semi-reclined in bed at end of session with bed alarm on and needs met.   Therapy Documentation Precautions:  Precautions Precautions: Cervical, Fall Precaution Comments: Reviewed cervical precautions Required Braces or Orthoses: Cervical Brace Cervical Brace: Hard collar, At all times Restrictions Weight Bearing Restrictions: No Pain:  denies pain  Therapy/Group: Individual Therapy  Elisabeth S Doe 08/28/2018, 9:59 AM 

## 2018-08-29 ENCOUNTER — Inpatient Hospital Stay (HOSPITAL_COMMUNITY): Payer: Managed Care, Other (non HMO) | Admitting: Speech Pathology

## 2018-08-29 ENCOUNTER — Inpatient Hospital Stay (HOSPITAL_COMMUNITY): Payer: Managed Care, Other (non HMO)

## 2018-08-29 ENCOUNTER — Inpatient Hospital Stay (HOSPITAL_COMMUNITY): Payer: Managed Care, Other (non HMO) | Admitting: Occupational Therapy

## 2018-08-29 ENCOUNTER — Inpatient Hospital Stay (HOSPITAL_COMMUNITY): Payer: Managed Care, Other (non HMO) | Admitting: Physical Therapy

## 2018-08-29 LAB — BASIC METABOLIC PANEL
Anion gap: 13 (ref 5–15)
BUN: 7 mg/dL — ABNORMAL LOW (ref 8–23)
CO2: 24 mmol/L (ref 22–32)
Calcium: 7.9 mg/dL — ABNORMAL LOW (ref 8.9–10.3)
Chloride: 104 mmol/L (ref 98–111)
Creatinine, Ser: 0.54 mg/dL — ABNORMAL LOW (ref 0.61–1.24)
GFR calc Af Amer: >60 mL/min (ref >60)
GFR calc non Af Amer: >60 mL/min (ref >60)
Glucose, Bld: 124 mg/dL — ABNORMAL HIGH (ref 70–99)
Potassium: 3.9 mmol/L (ref 3.5–5.1)
Sodium: 141 mmol/L (ref 135–145)

## 2018-08-29 LAB — GLUCOSE, CAPILLARY
Glucose-Capillary: 144 mg/dL — ABNORMAL HIGH (ref 70–99)
Glucose-Capillary: 152 mg/dL — ABNORMAL HIGH (ref 70–99)
Glucose-Capillary: 114 mg/dL — ABNORMAL HIGH (ref 70–99)
Glucose-Capillary: 195 mg/dL — ABNORMAL HIGH (ref 70–99)
Glucose-Capillary: 188 mg/dL — ABNORMAL HIGH (ref 70–99)
Glucose-Capillary: 128 mg/dL — ABNORMAL HIGH (ref 70–99)

## 2018-08-29 MED ORDER — METOCLOPRAMIDE HCL 5 MG PO TABS
10.0000 mg | ORAL_TABLET | Freq: Three times a day (TID) | ORAL | Status: DC
  Administered 2018-08-29 – 2018-08-30 (×2): 10 mg via ORAL
  Filled 2018-08-29 (×2): qty 2

## 2018-08-29 NOTE — Plan of Care (Signed)
  Problem: SCI BOWEL ELIMINATION Goal: RH STG MANAGE BOWEL WITH ASSISTANCE Description STG Manage Bowel with Min Assistance.  Outcome: Progressing Goal: RH STG SCI MANAGE BOWEL WITH MEDICATION WITH ASSISTANCE Description STG SCI Manage bowel with medication with min assistance.  Outcome: Progressing Goal: RH STG MANAGE BOWEL W/EQUIPMENT W/ASSISTANCE Description STG Manage Bowel With Equipment With min Assistance  Outcome: Progressing Goal: RH STG SCI MANAGE BOWEL PROGRAM W/ASSIST OR AS APPROPRIATE Description STG SCI Manage bowel program w/ min assist or as appropriate.  Outcome: Progressing Goal: RH OTHER STG BOWEL ELIMINATION GOALS W/ASSIST Description Other STG Bowel Elimination Goals With Assistance. Outcome: Progressing   Problem: SCI BLADDER ELIMINATION Goal: RH STG MANAGE BLADDER WITH ASSISTANCE Description STG Manage Bladder With Assistance Outcome: Progressing Goal: RH STG MANAGE BLADDER WITH MEDICATION WITH ASSISTANCE Description STG Manage Bladder With Medication With Assistance. Outcome: Progressing Goal: RH STG MANAGE BLADDER WITH EQUIPMENT WITH ASSISTANCE Description STG Manage Bladder With Equipment With Assistance Outcome: Progressing Goal: RH STG SCI MANAGE BLADDER PROGRAM W/ASSISTANCE Outcome: Progressing Goal: RH OTHER STG BLADDER ELIMINATION GOALS W/ASSIST Description Other STG Bladder Elimination Goals With Assistance Outcome: Progressing   Problem: RH SKIN INTEGRITY Goal: RH STG SKIN FREE OF INFECTION/BREAKDOWN Outcome: Progressing Goal: RH STG MAINTAIN SKIN INTEGRITY WITH ASSISTANCE Description STG Maintain Skin Integrity With Assistance. Outcome: Progressing Goal: RH STG ABLE TO PERFORM INCISION/WOUND CARE W/ASSISTANCE Description STG Able To Perform Incision/Wound Care With Assistance. Outcome: Progressing   Problem: RH SAFETY Goal: RH STG ADHERE TO SAFETY PRECAUTIONS W/ASSISTANCE/DEVICE Description STG Adhere to Safety Precautions  With Assistance/Device. Outcome: Progressing Goal: RH STG DECREASED RISK OF FALL WITH ASSISTANCE Description STG Decreased Risk of Fall With Assistance. Outcome: Progressing   Problem: RH PAIN MANAGEMENT Goal: RH STG PAIN MANAGED AT OR BELOW PT'S PAIN GOAL Outcome: Progressing   Problem: RH KNOWLEDGE DEFICIT SCI Goal: RH STG INCREASE KNOWLEDGE OF SELF CARE AFTER SCI Outcome: Progressing   

## 2018-08-29 NOTE — Progress Notes (Signed)
Occupational Therapy Session Note  Patient Details  Name: Brandon OchsCarl S Collins MRN: [id number] Date of Birth: 05/20/1951  Today's Date: 08/29/2018 OT Individual Time: 1335-1430 OT Individual Time Calculation (min): 55 min   Short Term Goals: Week 2:  OT Short Term Goal 1 (Week 2): Pt will maintain sitting balance for 5 minutes at mod A or better.  OT Short Term Goal 2 (Week 2): Pt will engaged in functional task for 10 minutes before needing rest break secondary to fatigue.  OT Short Term Goal 3 (Week 2): Pt will perform UB dressing with min A sitting on EOB.  OT Short Term Goal 4 (Week 2): Pt will be able to utilize STEDY with max A of 2 for shower or toilet transfer.   Skilled Therapeutic Interventions/Progress Updates:    Pt greeted in w/c, reported being up most of AM and wanting to return to bed. He was agreeable to first complete oral care and grooming tasks w/c level at sink with focus on UE NMR and trunk control when reaching for needed items and faucet levers. Able to meet FM demands of these tasks with encouragement. Afterwards slideboard<bed completed with 2 assist due to uphill slope. While EOB, pt engaged in dynamic reaching activities towards Lt>Rt sides, and also anteriorly. Lateral leans Lt>Rt onto elbows with close supervision to return to neutral alignment. Resisted gravity minimized elbow flexion/extension to strengthen UB. He then reported urinary incontinence. Pt returned to bed with Mod A. Pt incontinent of B+B in brief, with persistent bowel incontinence during hygiene completion. Mod A for rolling Rt>Lt and Total A for hygiene. RN present to assist with bedpan placement. Pt left with SLP at session exit. RN verbalized she would check on pt after a few minutes on bedpan.       Therapy Documentation Precautions:  Precautions Precautions: Cervical, Fall Precaution Comments: Reviewed cervical precautions Required Braces or Orthoses: Cervical Brace Cervical Brace: Hard collar, At  all times Restrictions Weight Bearing Restrictions: No Vital Signs: Therapy Vitals Temp: 98.5 F (36.9 C) Temp Source: Oral Pulse Rate: 79 Resp: 16 BP: 134/66 Patient Position (if appropriate): Lying Oxygen Therapy SpO2: 96 % O2 Device: Room Air Pain: Pain Assessment Pain Scale: 0-10 Pain Score: 0-No pain ADL:       Therapy/Group: Individual Therapy  Tanyika Barros A Whittney Steenson 08/29/2018, 3:46 PM

## 2018-08-29 NOTE — Progress Notes (Signed)
Speech Language Pathology Daily Session Note  Patient Details  Name: Brandon OchsCarl S Kozicki MRN: [id number] Date of Birth: 02-Dec-1950  Today's Date: 08/29/2018 SLP Individual Time: 1500-1530 SLP Individual Time Calculation (min): 30 min  Short Term Goals: Week 1: SLP Short Term Goal 1 (Week 1): STG=LTG due to anticipated short length of stay for ST  Skilled Therapeutic Interventions:  Pt was seen for skilled ST targeting dysphagia goals. Per chart review, pt is now cleared for a regular diet and thin liquids.  Pt consumed thin liquids and regular textures with supervision cues for use of general aspiration precautions.  He had x1 initial cough on first sip of thin liquids via straw and x1 initial cough on first bite of graham cracker.  No other overt s/s of aspiration were evident with consecutive sips or bites. Provided skilled education with pt and his wife who was present during treatment session regarding aspiration precautions (upright during meals, small bites/sips, encourage softer solids for now).  Encouraged wife to reach out to nursing and/or speech therapy team if pt exhibits increased difficulty (ie coughing, "choking," food getting stuck) as his PO intake picks up.  Recommend that pt continue on regular diet and thin liquids.  Pt left in bed with bed alarm set and call bell in reach.  Wife at bedside.  All questions were answered to pt's and family's satisfaction at this time.  Continue per current plan of care.    Pain Pain Assessment Pain Scale: 0-10 Pain Score: 0-No pain  Therapy/Group: Individual Therapy  Dazani Norby, Melanee SpryNicole L 08/29/2018, 3:40 PM

## 2018-08-29 NOTE — Progress Notes (Signed)
Occupational Therapy Session Note  Patient Details  Name: Brandon Collins MRN: [id number] Date of Birth: 11/20/1950  Today's Date: 08/29/2018 OT Individual Time: 0845-0915 OT Individual Time Calculation (min): 30 min    Short Term Goals: Week 2:  OT Short Term Goal 1 (Week 2): Pt will maintain sitting balance for 5 minutes at mod A or better.  OT Short Term Goal 2 (Week 2): Pt will engaged in functional task for 10 minutes before needing rest break secondary to fatigue.  OT Short Term Goal 3 (Week 2): Pt will perform UB dressing with min A sitting on EOB.  OT Short Term Goal 4 (Week 2): Pt will be able to utilize STEDY with max A of 2 for shower or toilet transfer.   Skilled Therapeutic Interventions/Progress Updates:    Session focused on self care tasks at bed level d/t incontinence. Pt received supine with no c/o pain, alert and awake. With max A for clothing management, pt was able to complete anterior peri hygiene with set up. Pt rolled R with mod A +1. Max A provided for posterior peri hygiene and brief change. Pt requested use of bed pan and had very loose BM. Total A hygiene provided. Pt left supine with all needs met and bed alarm set.   Therapy Documentation Precautions:  Precautions Precautions: Cervical, Fall Precaution Comments: Reviewed cervical precautions Required Braces or Orthoses: Cervical Brace Cervical Brace: Hard collar, At all times Restrictions Weight Bearing Restrictions: No    Pain: Pain Assessment Pain Scale: 0-10 Pain Score: 0-No pain   Therapy/Group: Individual Therapy  Sandra H Davis 08/29/2018, 9:31 AM  

## 2018-08-29 NOTE — Progress Notes (Signed)
Physical Therapy Session Note  Patient Details  Name: Brandon Collins MRN: [id number] Date of Birth: [birth year]  Today's Date: 08/29/2018 PT Individual Time: 1000-1100 PT Individual Time Calculation (min): 60 min   Short Term Goals: Week 2:  PT Short Term Goal 1 (Week 2): Pt will complete bed mobility with max A x 1 consistently PT Short Term Goal 2 (Week 2): Pt will perform least restrictive transfer with assist x 1 consistently PT Short Term Goal 3 (Week 2): Pt will propel manual w/c x 150 ft with min A  Skilled Therapeutic Interventions/Progress Updates:    Pt received seated in bed, agreeable to PT. No complaints of pain this date. Rolling L/R with min A and use of bedrails for dependent donning of pants. Supine to sit with max A with HOB elevated and use of bedrails. Sitting balance EOB with Supervision while pt dons shirt with setup A. Sliding board transfer bed to w/c with max A. Manual w/c propulsion x 150 ft with Supervision with BUE, increased time needed to complete. Sliding board transfer w/c to/from mat table with max A. Sitting balance EOB with Supervision. Session focus on scooting forwards/backwards to improve mobility with transfers and repositioning in w/c. Pt is min to mod A for scooting initially, progressing to CGA with increased understanding of and ability to follow multimodal cueing. Pt left seated in w/c in room with needs in reach, quick release belt and chair alarm in place.  Therapy Documentation Precautions:  Precautions Precautions: Cervical, Fall Precaution Comments: Reviewed cervical precautions Required Braces or Orthoses: Cervical Brace Cervical Brace: Hard collar, At all times Restrictions Weight Bearing Restrictions: No Pain: Pain Assessment Pain Scale: 0-10 Pain Score: 0-No pain    Therapy/Group: Individual Therapy  Peter Congoaylor Marsel Gail, PT, DPT  08/29/2018, 12:15 PM

## 2018-08-29 NOTE — Progress Notes (Signed)
[place] PHYSICAL MEDICINE & REHABILITATION PROGRESS NOTE   Subjective/Complaints: Slept well. No new issues this morning. Denies pain.   ROS: Patient denies fever, rash, sore throat, blurred vision, nausea, vomiting, diarrhea, cough, shortness of breath or chest pain, joint or back pain, headache, or mood change.   Objective:   No results found. Recent Labs    08/27/18 0435  WBC 13.1*  HGB 11.3*  HCT 36.1*  PLT 350   Recent Labs    08/28/18 0425 08/29/18 0659  NA 141 141  K 3.1* 3.9  CL 106 104  CO2 27 24  GLUCOSE 149* 124*  BUN 6* 7*  CREATININE 0.65 0.54*  CALCIUM 7.4* 7.9*    Intake/Output Summary (Last 24 hours) at 08/29/2018 1302 Last data filed at 08/28/2018 1859 Gross per 24 hour  Intake 120 ml  Output -  Net 120 ml     Physical Exam: Vital Signs Blood pressure 129/62, pulse 71, temperature 97.8 F (36.6 C), resp. rate 18, height 6' (1.829 m), weight 103.3 kg, SpO2 98 %.  Constitutional: No distress . Vital signs reviewed. HEENT: EOMI, oral membranes moist Neck: supple Cardiovascular: RRR without murmur. No JVD    Respiratory: CTA Bilaterally without wheezes or rales. Normal effort    GI: BS +, non-tender, non-distended  Musculoskeletal:  Lower extremity edema 1+ Neurological. Alret, fairly attentive. Motor: Right upper extremity: 4+/5 proximal distal Left upper extremity: 5/5 proximal distal Bilateral lower extremities:1+ to 2/5. Sensation intact to LT and pain.  Skin: Skin is warm and dry.  Surgical site  clean and dry  Psychiatric: flat     Assessment/Plan: 1. Functional deficits secondary to cervical myelopathy which require 3+ hours per day of interdisciplinary therapy in a comprehensive inpatient rehab setting.  Physiatrist is providing close team supervision and 24 hour management of active medical problems listed below.  Physiatrist and rehab team continue to assess barriers to discharge/monitor patient progress toward functional  and medical goals  Care Tool:  Bathing    Body parts bathed by patient: Right arm, Left arm, Chest, Front perineal area, Abdomen, Buttocks, Right upper leg, Left upper leg, Right lower leg, Left lower leg, Face   Body parts bathed by helper: Right arm, Left arm, Chest, Abdomen, Front perineal area, Buttocks, Right upper leg, Left upper leg, Right lower leg, Left lower leg(assist for thoroughness )     Bathing assist Assist Level: Minimal Assistance - Patient > 75%     Upper Body Dressing/Undressing Upper body dressing   What is the patient wearing?: Pull over shirt    Upper body assist Assist Level: Moderate Assistance - Patient 50 - 74%    Lower Body Dressing/Undressing Lower body dressing      What is the patient wearing?: Pants     Lower body assist Assist for lower body dressing: Maximal Assistance - Patient 25 - 49%     Toileting Toileting    Toileting assist Assist for toileting: Dependent - Patient 0%     Transfers Chair/bed transfer  Transfers assist  Chair/bed transfer activity did not occur: Safety/medical concerns  Chair/bed transfer assist level: Maximal Assistance - Patient 25 - 49%     Locomotion Ambulation   Ambulation assist   Ambulation activity did not occur: Safety/medical concerns          Walk 10 feet activity   Assist  Walk 10 feet activity did not occur: Safety/medical concerns        Walk 50 feet activity  Assist Walk 50 feet with 2 turns activity did not occur: Safety/medical concerns         Walk 150 feet activity   Assist Walk 150 feet activity did not occur: Safety/medical concerns         Walk 10 feet on uneven surface  activity   Assist Walk 10 feet on uneven surfaces activity did not occur: Safety/medical concerns         Wheelchair     Assist Will patient use wheelchair at discharge?: Yes Type of Wheelchair: Manual Wheelchair activity did not occur: Safety/medical  concerns  Wheelchair assist level: Supervision/Verbal cueing Max wheelchair distance: 150'    Wheelchair 50 feet with 2 turns activity    Assist    Wheelchair 50 feet with 2 turns activity did not occur: Safety/medical concerns   Assist Level: Supervision/Verbal cueing   Wheelchair 150 feet activity     Assist Wheelchair 150 feet activity did not occur: Safety/medical concerns   Assist Level: Supervision/Verbal cueing     Medical Problem List and Plan: 1.  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 Cervical collar at all times  -Continue CIR therapies including PT, OT    2.  DVT Prophylaxis/Anticoagulation: SCDs. Vascular study negative  -  lovenox 30mg  q12   3. Pain Management:  Oxycodone and Valium as needed.  -right ankle pain/swelling. 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 Mood:  Provide emotional support 5. Neuropsych: This patient is capable of making decisions on his own behalf. 6. Skin/Wound Care:  Routine skin checks 7. Fluids/Electrolytes/Nutrition, acute renal insufficiency:    -continue VF, with 20 meq K+ for potassium of 3.0  -added megace  -push po  -holding lisinopril and metformin,    8. Diabetes mellitus. Glucophage 1500 mg daily, Actos 15 mg daily, Amaryl 8 mg daily.   -glucophage held. reduced Amaryl to 1 mg  9. Hypertension. Lisinopril 40 mg daily, clonidine 0.1 mg twice a day, Norvasc 10 mg daily.   -held lisinopril per above  -no changes today 10. Hyperlipidemia. Lipitor 11. Constipation. Laxative assistance 12. Leukocytosis:  Resolved 13. Colonic ileus:   -belly improving.   -stools now regular and loose--change to PO reglan  -adv to regular diet  -continue OOB  -supp K+ and magnesium  -recheck labs tomorrow   LOS: 10 days A FACE TO FACE EVALUATION WAS PERFORMED  Ranelle OysterZachary T Nyesha Cliff 08/29/2018, 1:02 PM

## 2018-08-30 LAB — GLUCOSE, CAPILLARY
Glucose-Capillary: 119 mg/dL — ABNORMAL HIGH (ref 70–99)
Glucose-Capillary: 168 mg/dL — ABNORMAL HIGH (ref 70–99)
Glucose-Capillary: 219 mg/dL — ABNORMAL HIGH (ref 70–99)
Glucose-Capillary: 227 mg/dL — ABNORMAL HIGH (ref 70–99)

## 2018-08-30 LAB — BASIC METABOLIC PANEL
Anion gap: 10 (ref 5–15)
BUN: 9 mg/dL (ref 8–23)
CO2: 27 mmol/L (ref 22–32)
Calcium: 8.1 mg/dL — ABNORMAL LOW (ref 8.9–10.3)
Chloride: 104 mmol/L (ref 98–111)
Creatinine, Ser: 0.63 mg/dL (ref 0.61–1.24)
GFR calc Af Amer: >60 mL/min (ref >60)
GFR calc non Af Amer: >60 mL/min (ref >60)
Glucose, Bld: 135 mg/dL — ABNORMAL HIGH (ref 70–99)
Potassium: 4 mmol/L (ref 3.5–5.1)
Sodium: 141 mmol/L (ref 135–145)

## 2018-08-30 LAB — MAGNESIUM: Magnesium: 1.4 mg/dL — ABNORMAL LOW (ref 1.7–2.4)

## 2018-08-30 NOTE — Progress Notes (Signed)
[place] PHYSICAL MEDICINE & REHABILITATION PROGRESS NOTE   Subjective/Complaints: No issues overnight. Rested well  ROS: Patient denies fever, rash, sore throat, blurred vision, nausea, vomiting, diarrhea, cough, shortness of breath or chest pain, joint or back pain, headache, or mood change.   Objective:   No results found. No results for input(s): WBC, HGB, HCT, PLT in the last 72 hours. Recent Labs    08/29/18 0659 08/30/18 0513  NA 141 141  K 3.9 4.0  CL 104 104  CO2 24 27  GLUCOSE 124* 135*  BUN 7* 9  CREATININE 0.54* 0.63  CALCIUM 7.9* 8.1*    Intake/Output Summary (Last 24 hours) at 08/30/2018 1114 Last data filed at 08/30/2018 0945 Gross per 24 hour  Intake 797 ml  Output 822 ml  Net -25 ml     Physical Exam: Vital Signs Blood pressure (!) 147/64, pulse 86, temperature 98.2 F (36.8 C), temperature source Oral, resp. rate 12, height 6' (1.829 m), weight 103.3 kg, SpO2 96 %.  Constitutional: No distress . Vital signs reviewed. HEENT: EOMI, oral membranes moist Neck: supple Cardiovascular: RRR without murmur. No JVD    Respiratory: CTA Bilaterally without wheezes or rales. Normal effort    GI: BS +, non-tender, non-distended  Musculoskeletal:  Lower extremity edema 1+--stable Neurological. Alret, fairly attentive. Motor: Right upper extremity: 4+/5 proximal distal Left upper extremity: 5/5 proximal distal Bilateral lower extremities:1+ to 2/5. Sensation intact to LT and pain.  Skin: Skin is warm and dry.  Surgical site remains clean and dry  Psychiatric: flat     Assessment/Plan: 1. Functional deficits secondary to cervical myelopathy which require 3+ hours per day of interdisciplinary therapy in a comprehensive inpatient rehab setting.  Physiatrist is providing close team supervision and 24 hour management of active medical problems listed below.  Physiatrist and rehab team continue to assess barriers to discharge/monitor patient progress toward  functional and medical goals  Care Tool:  Bathing    Body parts bathed by patient: Right arm, Left arm, Chest, Front perineal area, Abdomen, Buttocks, Right upper leg, Left upper leg, Right lower leg, Left lower leg, Face   Body parts bathed by helper: Right arm, Left arm, Chest, Abdomen, Front perineal area, Buttocks, Right upper leg, Left upper leg, Right lower leg, Left lower leg(assist for thoroughness )     Bathing assist Assist Level: Minimal Assistance - Patient > 75%     Upper Body Dressing/Undressing Upper body dressing   What is the patient wearing?: Pull over shirt    Upper body assist Assist Level: Moderate Assistance - Patient 50 - 74%    Lower Body Dressing/Undressing Lower body dressing      What is the patient wearing?: Pants     Lower body assist Assist for lower body dressing: Maximal Assistance - Patient 25 - 49%     Toileting Toileting    Toileting assist Assist for toileting: Total Assistance - Patient < 25%     Transfers Chair/bed transfer  Transfers assist  Chair/bed transfer activity did not occur: Safety/medical concerns  Chair/bed transfer assist level: Maximal Assistance - Patient 25 - 49%     Locomotion Ambulation   Ambulation assist   Ambulation activity did not occur: Safety/medical concerns          Walk 10 feet activity   Assist  Walk 10 feet activity did not occur: Safety/medical concerns        Walk 50 feet activity   Assist Walk 50 feet with  2 turns activity did not occur: Safety/medical concerns         Walk 150 feet activity   Assist Walk 150 feet activity did not occur: Safety/medical concerns         Walk 10 feet on uneven surface  activity   Assist Walk 10 feet on uneven surfaces activity did not occur: Safety/medical concerns         Wheelchair     Assist Will patient use wheelchair at discharge?: Yes Type of Wheelchair: Manual Wheelchair activity did not occur: Safety/medical  concerns  Wheelchair assist level: Supervision/Verbal cueing Max wheelchair distance: 150'    Wheelchair 50 feet with 2 turns activity    Assist    Wheelchair 50 feet with 2 turns activity did not occur: Safety/medical concerns   Assist Level: Supervision/Verbal cueing   Wheelchair 150 feet activity     Assist Wheelchair 150 feet activity did not occur: Safety/medical concerns   Assist Level: Supervision/Verbal cueing     Medical Problem List and Plan: 1.  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 Cervical collar at all times  -Continue CIR therapies including PT, OT    2.  DVT Prophylaxis/Anticoagulation: SCDs. Vascular study negative  -  lovenox 30mg  q12   3. Pain Management:  Oxycodone and Valium as needed.  -right ankle pain/swelling. 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 Mood:  Provide emotional support 5. Neuropsych: This patient is capable of making decisions on his own behalf. 6. Skin/Wound Care:  Routine skin checks 7. Fluids/Electrolytes/Nutrition, acute renal insufficiency:    -change IVF to HS only  -continue megace  -push po  -holding lisinopril and metformin,    8. Diabetes mellitus. Glucophage 1500 mg daily, Actos 15 mg daily, Amaryl 8 mg daily.   -glucophage held. reduced Amaryl to 1 mg  9. Hypertension. Lisinopril 40 mg daily, clonidine 0.1 mg twice a day, Norvasc 10 mg daily.   -held lisinopril per above  -no changes today 10. Hyperlipidemia. Lipitor 11. Constipation. Laxative assistance 12. Leukocytosis:  Resolved 13. Colonic ileus:   -belly improving.   -stools now regular and loose---dc reglan  -advanced to regular diet  -continue OOB  -supp K+ and magnesium  -continue to monitor labs   LOS: 11 days A FACE TO FACE EVALUATION WAS PERFORMED  Ranelle Oyster 08/30/2018, 11:14 AM

## 2018-08-30 NOTE — Plan of Care (Signed)
  Problem: SCI BOWEL ELIMINATION Goal: RH STG MANAGE BOWEL WITH ASSISTANCE Description STG Manage Bowel with Min Assistance.  Outcome: Progressing Goal: RH STG SCI MANAGE BOWEL WITH MEDICATION WITH ASSISTANCE Description STG SCI Manage bowel with medication with min assistance.  Outcome: Progressing Goal: RH STG MANAGE BOWEL W/EQUIPMENT W/ASSISTANCE Description STG Manage Bowel With Equipment With min Assistance  Outcome: Progressing Goal: RH STG SCI MANAGE BOWEL PROGRAM W/ASSIST OR AS APPROPRIATE Description STG SCI Manage bowel program w/ min assist or as appropriate.  Outcome: Progressing Goal: RH OTHER STG BOWEL ELIMINATION GOALS W/ASSIST Description Other STG Bowel Elimination Goals With Assistance. Outcome: Progressing   Problem: SCI BLADDER ELIMINATION Goal: RH STG MANAGE BLADDER WITH ASSISTANCE Description STG Manage Bladder With Assistance Outcome: Progressing Goal: RH STG MANAGE BLADDER WITH MEDICATION WITH ASSISTANCE Description STG Manage Bladder With Medication With Assistance. Outcome: Progressing Goal: RH STG MANAGE BLADDER WITH EQUIPMENT WITH ASSISTANCE Description STG Manage Bladder With Equipment With Assistance Outcome: Progressing Goal: RH STG SCI MANAGE BLADDER PROGRAM W/ASSISTANCE Outcome: Progressing Goal: RH OTHER STG BLADDER ELIMINATION GOALS W/ASSIST Description Other STG Bladder Elimination Goals With Assistance Outcome: Progressing   Problem: RH SKIN INTEGRITY Goal: RH STG SKIN FREE OF INFECTION/BREAKDOWN Outcome: Progressing Goal: RH STG MAINTAIN SKIN INTEGRITY WITH ASSISTANCE Description STG Maintain Skin Integrity With Assistance. Outcome: Progressing Goal: RH STG ABLE TO PERFORM INCISION/WOUND CARE W/ASSISTANCE Description STG Able To Perform Incision/Wound Care With Assistance. Outcome: Progressing   Problem: RH SAFETY Goal: RH STG ADHERE TO SAFETY PRECAUTIONS W/ASSISTANCE/DEVICE Description STG Adhere to Safety Precautions  With Assistance/Device. Outcome: Progressing Goal: RH STG DECREASED RISK OF FALL WITH ASSISTANCE Description STG Decreased Risk of Fall With Assistance. Outcome: Progressing   Problem: RH PAIN MANAGEMENT Goal: RH STG PAIN MANAGED AT OR BELOW PT'S PAIN GOAL Outcome: Progressing   Problem: RH KNOWLEDGE DEFICIT SCI Goal: RH STG INCREASE KNOWLEDGE OF SELF CARE AFTER SCI Outcome: Progressing

## 2018-08-31 ENCOUNTER — Inpatient Hospital Stay (HOSPITAL_COMMUNITY): Payer: Managed Care, Other (non HMO) | Admitting: Physical Therapy

## 2018-08-31 ENCOUNTER — Inpatient Hospital Stay (HOSPITAL_COMMUNITY): Payer: Managed Care, Other (non HMO)

## 2018-08-31 ENCOUNTER — Inpatient Hospital Stay (HOSPITAL_COMMUNITY): Payer: Managed Care, Other (non HMO) | Admitting: Occupational Therapy

## 2018-08-31 LAB — GLUCOSE, CAPILLARY: Glucose-Capillary: 201 mg/dL — ABNORMAL HIGH (ref 70–99)

## 2018-08-31 MED ORDER — GLIMEPIRIDE 4 MG PO TABS
4.0000 mg | ORAL_TABLET | Freq: Every day | ORAL | Status: DC
  Administered 2018-08-31 – 2018-09-04 (×5): 4 mg via ORAL
  Filled 2018-08-31 (×5): qty 1

## 2018-08-31 NOTE — Progress Notes (Addendum)
PHYSICAL MEDICINE & REHABILITATION PROGRESS NOTE   Subjective/Complaints: Up in bed. Calling out for someone to help him get up  ROS: Patient denies fever, rash, sore throat, blurred vision, nausea, vomiting, diarrhea, cough, shortness of breath or chest pain, joint or back pain, headache, or mood change.    Objective:   No results found. No results for input(s): WBC, HGB, HCT, PLT in the last 72 hours. Recent Labs    08/29/18 0659 08/30/18 0513  NA 141 141  K 3.9 4.0  CL 104 104  CO2 24 27  GLUCOSE 124* 135*  BUN 7* 9  CREATININE 0.54* 0.63  CALCIUM 7.9* 8.1*    Intake/Output Summary (Last 24 hours) at 08/31/2018 1242 Last data filed at 08/31/2018 0529 Gross per 24 hour  Intake 240 ml  Output 1220 ml  Net -980 ml     Physical Exam: Vital Signs Blood pressure (!) 162/71, pulse 82, temperature 97.9 F (36.6 C), temperature source Oral, resp. rate 19, height 6' (1.829 m), weight 103.3 kg, SpO2 98 %.  Constitutional: No distress . Vital signs reviewed. HEENT: EOMI, oral membranes moist Neck: supple Cardiovascular: RRR without murmur. No JVD    Respiratory: CTA Bilaterally without wheezes or rales. Normal effort    GI: BS +, non-tender, non-distended, improved appearance Musculoskeletal:  Lower extremity edema 1+--stable Neurological. [name], slightly confused. Motor: Right upper extremity: 4+/5 proximal distal Left upper extremity: 5/5 proximal distal Bilateral lower extremities 2/5 Skin: Skin is warm and dry.  Surgical site remains clean and dry  Psychiatric: flat     Assessment/Plan: 1. Functional deficits secondary to cervical myelopathy which require 3+ hours per day of interdisciplinary therapy in a comprehensive inpatient rehab setting.  Physiatrist is providing close team supervision and 24 hour management of active medical problems listed below.  Physiatrist and rehab team continue to assess barriers to discharge/monitor patient progress  toward functional and medical goals  Care Tool:  Bathing    Body parts bathed by patient: Right arm, Left arm, Chest, Front perineal area, Abdomen, Buttocks, Right upper leg, Left upper leg, Right lower leg, Left lower leg, Face   Body parts bathed by helper: Right arm, Left arm, Chest, Abdomen, Front perineal area, Buttocks, Right upper leg, Left upper leg, Right lower leg, Left lower leg(assist for thoroughness )     Bathing assist Assist Level: Minimal Assistance - Patient > 75%     Upper Body Dressing/Undressing Upper body dressing   What is the patient wearing?: Pull over shirt    Upper body assist Assist Level: Moderate Assistance - Patient 50 - 74%    Lower Body Dressing/Undressing Lower body dressing      What is the patient wearing?: Pants     Lower body assist Assist for lower body dressing: Maximal Assistance - Patient 25 - 49%     Toileting Toileting    Toileting assist Assist for toileting: Total Assistance - Patient < 25%     Transfers Chair/bed transfer  Transfers assist  Chair/bed transfer activity did not occur: Safety/medical concerns  Chair/bed transfer assist level: Moderate Assistance - Patient 50 - 74%     Locomotion Ambulation   Ambulation assist   Ambulation activity did not occur: Safety/medical concerns          Walk 10 feet activity   Assist  Walk 10 feet activity did not occur: Safety/medical concerns        Walk 50 feet activity   Assist Walk 50 feet  with 2 turns activity did not occur: Safety/medical concerns         Walk 150 feet activity   Assist Walk 150 feet activity did not occur: Safety/medical concerns         Walk 10 feet on uneven surface  activity   Assist Walk 10 feet on uneven surfaces activity did not occur: Safety/medical concerns         Wheelchair     Assist Will patient use wheelchair at discharge?: Yes Type of Wheelchair: Manual Wheelchair activity did not occur:  Safety/medical concerns  Wheelchair assist level: Supervision/Verbal cueing Max wheelchair distance: 150'    Wheelchair 50 feet with 2 turns activity    Assist    Wheelchair 50 feet with 2 turns activity did not occur: Safety/medical concerns   Assist Level: Supervision/Verbal cueing   Wheelchair 150 feet activity     Assist Wheelchair 150 feet activity did not occur: Safety/medical concerns   Assist Level: Supervision/Verbal cueing     Medical Problem List and Plan: 1.  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 Cervical collar at all times  -Continue CIR therapies including PT, OT    2.  DVT Prophylaxis/Anticoagulation: SCDs. Vascular study negative  -  lovenox 30mg  q12   3. Pain Management:  Oxycodone and Valium as needed.  -right ankle pain/swelling. 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 Mood:  Provide emotional support 5. Neuropsych: This patient is capable of making decisions on his own behalf. 6. Skin/Wound Care:  Routine skin checks 7. Fluids/Electrolytes/Nutrition, acute renal insufficiency:    Off ivf  -continue megace, appetite picking up  -push po  -holding lisinopril and metformin,    8. Diabetes mellitus. Glucophage 1500 mg daily, Actos 15 mg daily, Amaryl 8 mg daily.   -glucophage held. reduced Amaryl : increase to 4mg   9. Hypertension. Lisinopril 40 mg daily, clonidine 0.1 mg twice a day, Norvasc 10 mg daily.   -held lisinopril per above  -no changes today 10. Hyperlipidemia. Lipitor 11. Constipation. Laxative assistance 12. Leukocytosis:  Resolved 13. Colonic ileus:   -belly improved   -stools now regular and loose---off reglan, hold sennokot  -advanced to regular diet  -continue OOB  -supp K+ and magnesium  -continue to monitor labs   LOS: 12 days A FACE TO FACE EVALUATION WAS PERFORMED  Brandon Collins 08/31/2018, 12:42 PM

## 2018-08-31 NOTE — Plan of Care (Signed)
  Problem: SCI BOWEL ELIMINATION Goal: RH STG MANAGE BOWEL WITH ASSISTANCE Description STG Manage Bowel with Min Assistance.  Outcome: Progressing Goal: RH STG SCI MANAGE BOWEL WITH MEDICATION WITH ASSISTANCE Description STG SCI Manage bowel with medication with min assistance.  Outcome: Progressing Goal: RH STG MANAGE BOWEL W/EQUIPMENT W/ASSISTANCE Description STG Manage Bowel With Equipment With min Assistance  Outcome: Progressing Goal: RH STG SCI MANAGE BOWEL PROGRAM W/ASSIST OR AS APPROPRIATE Description STG SCI Manage bowel program w/ min assist or as appropriate.  Outcome: Progressing Goal: RH OTHER STG BOWEL ELIMINATION GOALS W/ASSIST Description Other STG Bowel Elimination Goals With Assistance. Outcome: Progressing   Problem: SCI BLADDER ELIMINATION Goal: RH STG MANAGE BLADDER WITH ASSISTANCE Description STG Manage Bladder With Assistance Outcome: Progressing Goal: RH STG MANAGE BLADDER WITH MEDICATION WITH ASSISTANCE Description STG Manage Bladder With Medication With Assistance. Outcome: Progressing Goal: RH STG MANAGE BLADDER WITH EQUIPMENT WITH ASSISTANCE Description STG Manage Bladder With Equipment With Assistance Outcome: Progressing Goal: RH STG SCI MANAGE BLADDER PROGRAM W/ASSISTANCE Outcome: Progressing Goal: RH OTHER STG BLADDER ELIMINATION GOALS W/ASSIST Description Other STG Bladder Elimination Goals With Assistance Outcome: Progressing   Problem: RH SKIN INTEGRITY Goal: RH STG SKIN FREE OF INFECTION/BREAKDOWN Outcome: Progressing Goal: RH STG MAINTAIN SKIN INTEGRITY WITH ASSISTANCE Description STG Maintain Skin Integrity With Assistance. Outcome: Progressing Goal: RH STG ABLE TO PERFORM INCISION/WOUND CARE W/ASSISTANCE Description STG Able To Perform Incision/Wound Care With Assistance. Outcome: Progressing   Problem: RH SAFETY Goal: RH STG ADHERE TO SAFETY PRECAUTIONS W/ASSISTANCE/DEVICE Description STG Adhere to Safety Precautions  With Assistance/Device. Outcome: Progressing Goal: RH STG DECREASED RISK OF FALL WITH ASSISTANCE Description STG Decreased Risk of Fall With Assistance. Outcome: Progressing   Problem: RH PAIN MANAGEMENT Goal: RH STG PAIN MANAGED AT OR BELOW PT'S PAIN GOAL Outcome: Progressing   Problem: RH KNOWLEDGE DEFICIT SCI Goal: RH STG INCREASE KNOWLEDGE OF SELF CARE AFTER SCI Outcome: Progressing   

## 2018-08-31 NOTE — Progress Notes (Signed)
Physical Therapy Session Note  Patient Details  Name: Brandon Collins MRN: [id number] Date of Birth: [birth year]  Today's Date: 08/31/2018 PT Individual Time: 0900-1000 PT Individual Time Calculation (min): 60 min   Short Term Goals: Week 2:  PT Short Term Goal 1 (Week 2): Pt will complete bed mobility with max A x 1 consistently PT Short Term Goal 2 (Week 2): Pt will perform least restrictive transfer with assist x 1 consistently PT Short Term Goal 3 (Week 2): Pt will propel manual w/c x 150 ft with min A  Skilled Therapeutic Interventions/Progress Updates:    Pt supine in bed upon PT arrival, agreeable to therapy tx and denies pain at rest. Pt performed rolling in each direction with min assist and use of bedrails while therapist assisted to don shorts. Pt performed supine>sitting EOB with mod assist. Sitting>sidelying on elbow with min assist while therapist placed slideboard. Pt performed slideboard transfer from bed>w/c with min assist, verbal cues for techniques. Pt transported to the gym. Pt performed slideboard transfer to mat with mod assist, verbal cues for techniques. Pt transferred to long sitting with mod assist, maintained long sitting x 2 minutes for hamstring stretching, pt reports increased R knee pain. Pt transferred to sitting. Pt performed x 3 partial sit<>stands from elevated mat within stedy, max assist +2, unable to come up to full standing. Pt transferred back to w/c with slideboard and min assist, transported back to room. Slideboard transfer back to bed with mod assist and sit>supine with mod assist, left supine with needs in reach and bed alarm set.   Therapy Documentation Precautions:  Precautions Precautions: Cervical, Fall Precaution Comments: Reviewed cervical precautions Required Braces or Orthoses: Cervical Brace Cervical Brace: Hard collar, At all times Restrictions Weight Bearing Restrictions: No    Therapy/Group: Individual Therapy  Cresenciano GenreEmily van Schagen,  PT, DPT 08/31/2018, 9:16 AM

## 2018-09-01 ENCOUNTER — Inpatient Hospital Stay (HOSPITAL_COMMUNITY): Payer: Managed Care, Other (non HMO)

## 2018-09-01 ENCOUNTER — Inpatient Hospital Stay (HOSPITAL_COMMUNITY): Payer: Managed Care, Other (non HMO) | Admitting: Physical Therapy

## 2018-09-01 ENCOUNTER — Inpatient Hospital Stay (HOSPITAL_COMMUNITY): Payer: Managed Care, Other (non HMO) | Admitting: Occupational Therapy

## 2018-09-01 LAB — GLUCOSE, CAPILLARY
Glucose-Capillary: 119 mg/dL — ABNORMAL HIGH (ref 70–99)
Glucose-Capillary: 189 mg/dL — ABNORMAL HIGH (ref 70–99)
Glucose-Capillary: 142 mg/dL — ABNORMAL HIGH (ref 70–99)
Glucose-Capillary: 78 mg/dL (ref 70–99)
Glucose-Capillary: 83 mg/dL (ref 70–99)
Glucose-Capillary: 113 mg/dL — ABNORMAL HIGH (ref 70–99)
Glucose-Capillary: 185 mg/dL — ABNORMAL HIGH (ref 70–99)

## 2018-09-01 NOTE — Progress Notes (Signed)
Occupational Therapy Session Note  Patient Details  Name: Brandon Collins MRN: [id number] Date of Birth: [birth year]  Today's Date: 09/01/2018 OT Individual Time: [id number] and [id number] OT Individual Time Calculation (min): 77 min and 41 min  Short Term Goals: Week 2:  OT Short Term Goal 1 (Week 2): Pt will maintain sitting balance for 5 minutes at mod A or better.  OT Short Term Goal 2 (Week 2): Pt will engaged in functional task for 10 minutes before needing rest break secondary to fatigue.  OT Short Term Goal 3 (Week 2): Pt will perform UB dressing with min A sitting on EOB.  OT Short Term Goal 4 (Week 2): Pt will be able to utilize STEDY with max A of 2 for shower or toilet transfer.   Skilled Therapeutic Interventions/Progress Updates:   Pt greeted in bed, requesting to eat breakfast. Pt did so in unsupported long sitting for hamstring stretch. He required vcs for small bites and slow rate when eating. Afterwards he proceeded to complete bathing/dressing utilizing long sitting and figure 4 positions. Pt required assist for holding LE positions while washing LEs. Increased assist for supporting R LE in modified figure 4 and for washing Rt foot and threading pants. Min A for donning overhead shirt. Total A for thigh high Teds. He then returned to supine and rolled Rt>Lt with Min-Mod A for perihygiene post incontinent BM in brief. Pt able to state that he had to void bladder, and voided in urinal with assist for positioning. Once brief and pants were fully elevated post rolling, he was amenable to transfer to w/c. Due to lack of transfer board, attempted squat pivot however w/c moved during transfer. For safety, notified staff for assist and transfer board was retrieved. Mod A for slideboard<w/c with 2nd helper stabilizing w/c. Pt left in w/c with all needs within reach and safety belt fastened.   2nd Session 1:1 tx (41 min) Pt greeted in bed asleep, and difficult to rouse. When asked if he would  like for OT to come back in 30 minutes, he opened eyes and stated "that would be fine." When OT returned, his wife was there and pt agreeable to tx. Started session with pt pulling himself into long sitting using B bedrails with instruction. With Min-Mod A for balance and tasks, pt utilized circle sitting position to thread LEs into shorts. Pt rolled Lt>Rt with Min-Mod A and assisted OT with elevating pants over hips. Supine<sit from flat bed completed with Max A and slideboard<w/c completed with Min A. Once pt repositioned himself in w/c, he engaged in PVC pipe tree activity. Tx focus placed on cognitive remediation and UE NMR. He initially required step by step cues for initiating, sequencing, and problem solving. This faded to mod cuing with task repetition. At end of session pt was left with spouse. Encouraged her to propel pt around unit to increase stimulation and provide orientation information. RN made aware that spouse may take pt out of room.     Therapy Documentation Precautions:  Precautions Precautions: Cervical, Fall Precaution Comments: Reviewed cervical precautions Required Braces or Orthoses: Cervical Brace Cervical Brace: Hard collar, At all times Restrictions Weight Bearing Restrictions: No   Pain: Pt denied pain during sessions    ADL:        Therapy/Group: Individual Therapy  Brandon Collins A Brandon Collins 09/01/2018, 12:33 PM

## 2018-09-01 NOTE — Progress Notes (Signed)
Physical Therapy Session Note  Patient Details  Name: Brandon OchsCarl S Nicholes MRN: [id number] Date of Birth: 16-Aug-1951  Today's Date: 09/01/2018 PT Individual Time: 1000-1100 PT Individual Time Calculation (min): 60 min   Short Term Goals: Week 2:  PT Short Term Goal 1 (Week 2): Pt will complete bed mobility with max A x 1 consistently PT Short Term Goal 2 (Week 2): Pt will perform least restrictive transfer with assist x 1 consistently PT Short Term Goal 3 (Week 2): Pt will propel manual w/c x 150 ft with min A  Skilled Therapeutic Interventions/Progress Updates:    Pt received seated in w/c in room, agreeable to PT. No complaints of pain. Manual w/c propulsion x 150 ft with BUE and Supervision. Pt requires min cueing for safe w/c setup for slide board transfer. Slide board transfer min A w/c to therapy mat. Sit to stand with max A x 2 to stedy. Pt is max A x 2 to stand from stedy seat. Pt exhibits poor ability to extend hips and trunk in standing. Pt reports feeling lightheaded once seated on stedy seat, BP 149/92. Pt exhibits fair tolerance for sitting on stedy seat and requests to sit back on therapy mat. BP 134/76 once seated on mat table. Pt's symptoms resolve once seated on therapy mat for several minutes. Sit to supine mod A. Supine B HS stretch 3 x 45 sec each. Supine to sit with mod A. Slide board transfer mat to w/c with min A. Pt left seated in w/c in room with needs in reach, quick release belt and chair alarm in place, telesitter present.  Therapy Documentation Precautions:  Precautions Precautions: Cervical, Fall Precaution Comments: Reviewed cervical precautions Required Braces or Orthoses: Cervical Brace Cervical Brace: Hard collar, At all times Restrictions Weight Bearing Restrictions: No   Therapy/Group: Individual Therapy   Peter Congoaylor Greydis Stlouis, PT, DPT  09/01/2018, 12:23 PM

## 2018-09-01 NOTE — Progress Notes (Signed)
Speech Language Pathology Daily Session Note  Patient Details  Name: Brandon Collins MRN: [id number] Date of Birth: 1951/09/08  Today's Date: 09/01/2018 SLP Individual Time: [id number] SLP Individual Time Calculation (min): 27 min  Short Term Goals: Week 1: SLP Short Term Goal 1 (Week 1): STG=LTG due to anticipated short length of stay for ST  Skilled Therapeutic Interventions:Skilled ST services focused on swallow skills. SLP facilitated PO consumption of thin via straw and regular textured lunch tray, pt demonstrated initial protective cough with thin (carbonated drink) via straw and then three delayed coughs during PO consumption of solids/liquids. Pt demonstrated ability self-feed  And use of swallow strategies small sips/bites with supervision A verbal cues.  Pt's pastor entered room, pt was left with visitor. Pt was left in room with call bell within reach and bed alarm set. Recommend to continue skilled ST services.      Pain Pain Assessment Pain Score: 0-No pain  Therapy/Group: Individual Therapy  Brandon Collins  [facility] 09/01/2018, 1:28 PM

## 2018-09-01 NOTE — Progress Notes (Signed)
[facility] PHYSICAL MEDICINE & REHABILITATION PROGRESS NOTE   Subjective/Complaints: Up in bed. No new complaints. Comfortable this morning  ROS: Limited due to cognitive/behavioral   Objective:   No results found. No results for input(s): WBC, HGB, HCT, PLT in the last 72 hours. Recent Labs    08/30/18 0513  NA 141  K 4.0  CL 104  CO2 27  GLUCOSE 135*  BUN 9  CREATININE 0.63  CALCIUM 8.1*    Intake/Output Summary (Last 24 hours) at 09/01/2018 0841 Last data filed at 09/01/2018 0341 Gross per 24 hour  Intake 761 ml  Output 1974 ml  Net -1213 ml     Physical Exam: Vital Signs Blood pressure (!) 144/72, pulse 75, temperature 97.6 F (36.4 C), temperature source Oral, resp. rate 18, height 6' (1.829 m), weight 103.3 kg, SpO2 100 %.  Constitutional: No distress . Vital signs reviewed. HEENT: EOMI, oral membranes moist Neck: supple Cardiovascular: RRR without murmur. No JVD    Respiratory: CTA Bilaterally without wheezes or rales. Normal effort    GI: BS +, non-tender, non-distended  Musculoskeletal:  Lower extremity edema 1+--unchanged Neurological. [name], slightly confused. Motor: Right upper extremity: 4+/5 proximal distal Left upper extremity: 5/5 proximal distal Bilateral lower extremities 2/5 Skin: Skin is warm and dry.  Surgical site   clean and dry  Psychiatric: flat     Assessment/Plan: 1. Functional deficits secondary to cervical myelopathy which require 3+ hours per day of interdisciplinary therapy in a comprehensive inpatient rehab setting.  Physiatrist is providing close team supervision and 24 hour management of active medical problems listed below.  Physiatrist and rehab team continue to assess barriers to discharge/monitor patient progress toward functional and medical goals  Care Tool:  Bathing    Body parts bathed by patient: Right arm, Left arm, Chest, Front perineal area, Abdomen, Buttocks, Right upper leg, Left upper leg, Right lower  leg, Left lower leg, Face   Body parts bathed by helper: Right arm, Left arm, Chest, Abdomen, Front perineal area, Buttocks, Right upper leg, Left upper leg, Right lower leg, Left lower leg(assist for thoroughness )     Bathing assist Assist Level: Minimal Assistance - Patient > 75%     Upper Body Dressing/Undressing Upper body dressing   What is the patient wearing?: Pull over shirt    Upper body assist Assist Level: Moderate Assistance - Patient 50 - 74%    Lower Body Dressing/Undressing Lower body dressing      What is the patient wearing?: Pants     Lower body assist Assist for lower body dressing: Maximal Assistance - Patient 25 - 49%     Toileting Toileting    Toileting assist Assist for toileting: Total Assistance - Patient < 25%     Transfers Chair/bed transfer  Transfers assist  Chair/bed transfer activity did not occur: Safety/medical concerns  Chair/bed transfer assist level: Moderate Assistance - Patient 50 - 74%     Locomotion Ambulation   Ambulation assist   Ambulation activity did not occur: Safety/medical concerns          Walk 10 feet activity   Assist  Walk 10 feet activity did not occur: Safety/medical concerns        Walk 50 feet activity   Assist Walk 50 feet with 2 turns activity did not occur: Safety/medical concerns         Walk 150 feet activity   Assist Walk 150 feet activity did not occur: Safety/medical concerns  Walk 10 feet on uneven surface  activity   Assist Walk 10 feet on uneven surfaces activity did not occur: Safety/medical concerns         Wheelchair     Assist Will patient use wheelchair at discharge?: Yes Type of Wheelchair: Manual Wheelchair activity did not occur: Safety/medical concerns  Wheelchair assist level: Supervision/Verbal cueing Max wheelchair distance: 150'    Wheelchair 50 feet with 2 turns activity    Assist    Wheelchair 50 feet with 2 turns  activity did not occur: Safety/medical concerns   Assist Level: Supervision/Verbal cueing   Wheelchair 150 feet activity     Assist Wheelchair 150 feet activity did not occur: Safety/medical concerns   Assist Level: Supervision/Verbal cueing     Medical Problem List and Plan: 1.  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 Cervical collar at all times  -Continue CIR therapies including PT, OT    2.  DVT Prophylaxis/Anticoagulation: SCDs. Vascular study negative  -  lovenox 30mg  q12   3. Pain Management:  Oxycodone and Valium as needed.  -right ankle pain/swelling. 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 Mood:  Provide emotional support 5. Neuropsych: This patient is capable of making decisions on his own behalf. 6. Skin/Wound Care:  Routine skin checks 7. Fluids/Electrolytes/Nutrition, acute renal insufficiency:    Off ivf  -continue megace, appetite picking up gradually  -push po  -holding lisinopril and metformin,  -check bmet tomorrow, prealbumin    8. Diabetes mellitus. Glucophage 1500 mg daily, Actos 15 mg daily, Amaryl 8 mg daily.   -glucophage held. reduced Amaryl : increased to 4mg  with improved control so far 9. Hypertension. Lisinopril 40 mg daily, clonidine 0.1 mg twice a day, Norvasc 10 mg daily.   -held lisinopril per above  -controlled 12/16  -no changes today 10. Hyperlipidemia. Lipitor 11. Constipation. Laxative assistance 12. Leukocytosis:  Resolved 13. Colonic ileus:   -belly improved   -stools now regular and loose---off reglan, hold sennokot  -advanced to regular diet  -continue OOB  -supp K+ and magnesium  -continue to monitor labs--tomorrow   LOS: 13 days A FACE TO FACE EVALUATION WAS PERFORMED  Ranelle Oyster 09/01/2018, 8:41 AM

## 2018-09-01 NOTE — Progress Notes (Signed)
Nutrition Follow-up  DOCUMENTATION CODES:   Obesity unspecified  INTERVENTION:   - Ordered snacks BID between meals - Ordered new weight (last weight taken 12/3) - Continue Ensure Enlive BID in any flavor but strawberry (each provides 350 kcal and 20 g protein) - Continue Megace   NUTRITION DIAGNOSIS:   Inadequate oral intake related to altered GI function as evidenced by meal completion < 50%, other (comment)(clear liquid diet order x 6 days).  Ongoing  GOAL:   Patient will meet greater than or equal to 90% of their needs  Improving, but not meeting  MONITOR:   PO intake, Supplement acceptance, Diet advancement, Skin, Labs, Weight trends  REASON FOR ASSESSMENT:   Malnutrition Screening Tool    ASSESSMENT:   67 year old male with PMH significant for HTN, T2DM, cervical myelopathy with cord compression, and myelomalacia C3-C7 with progressive decline for the past 6 months. Pt presented 07/2618 with new onset BUE weakness,LLE weakness, and balance deficits with noted falls as well as difficulty with basic ADLs. Pt underwent anterior cervical decompression and fusion C3-4, 4-5, 5-6 and C6-7 as well as placement of anterior instrumentation.  Labs: glucose 135, Hgb 11.3, Hct 36.1% Meds: cholecalciferol 1000 U daily, Lipitor 20 mg, Ensure Enlive TID, Megace 400 mg BID, Protonix EC 40 mg daily, K-Dur Klor-Con 20 mEq BID  Diet advanced to full liquid on 12/12, advanced to Carb Mod on 12/13.  No new wt since 12/3 - ordered  Pt seated in wheelchair at time of visit. States his appetite is fair without much improvement. Unsure if Megace is helping. Would like to continue Ensure TID and Boost Breeze TID. Informed pt that between-meal snacks were ordered for him.  NUTRITION - FOCUSED PHYSICAL EXAM:   Most Recent Value  Orbital Region  No depletion  Upper Arm Region  Mild depletion  Thoracic and Lumbar Region  No depletion  Buccal Region  Mild depletion  Temple Region  Mild  depletion  Clavicle Bone Region  No depletion  Clavicle and Acromion Bone Region  No depletion  Scapular Bone Region  No depletion  Dorsal Hand  Mild depletion  Patellar Region  Mild depletion  Anterior Thigh Region  Mild depletion  Posterior Calf Region  Mild depletion  Edema (RD Assessment)  Unable to assess  Hair  Reviewed  Eyes  Reviewed  Mouth  Reviewed  Skin  Reviewed  Nails  Reviewed      Diet Order:  25-75% of meals completed 12/15, per nsg documentation Diet Order            Diet Carb Modified Fluid consistency: Thin; Room service appropriate? Yes  Diet effective now              EDUCATION NEEDS:   Education needs have been addressed  Skin:  Skin Assessment: Skin Integrity Issues: Skin Integrity Issues:: Stage I, Incisions Stage I: right ankle Incisions: incision  Last BM:  12/15, type 7  Height:  Ht Readings from Last 1 Encounters:  08/19/18 6' (1.829 m)    Weight:  Wt Readings from Last 1 Encounters:  08/19/18 103.3 kg    Ideal Body Weight:  80.9 kg  BMI:  Body mass index is 30.89 kg/m.  Estimated Nutritional Needs:   Kcal:  2300-2500  Protein:  115-130 grams  Fluid:  >/= 2.3 L   Jolaine ArtistHannah Chyrl Elwell, MS, RDN, LDN Pager: [phone]

## 2018-09-02 ENCOUNTER — Inpatient Hospital Stay (HOSPITAL_COMMUNITY): Payer: Managed Care, Other (non HMO)

## 2018-09-02 ENCOUNTER — Inpatient Hospital Stay (HOSPITAL_COMMUNITY): Payer: Managed Care, Other (non HMO) | Admitting: Occupational Therapy

## 2018-09-02 ENCOUNTER — Inpatient Hospital Stay (HOSPITAL_COMMUNITY): Payer: Managed Care, Other (non HMO) | Admitting: Physical Therapy

## 2018-09-02 LAB — URINALYSIS, COMPLETE (UACMP) WITH MICROSCOPIC
Bilirubin Urine: NEGATIVE
Glucose, UA: NEGATIVE mg/dL
Hgb urine dipstick: NEGATIVE
Ketones, ur: NEGATIVE mg/dL
Nitrite: POSITIVE — AB
Protein, ur: NEGATIVE mg/dL
Specific Gravity, Urine: 1.014 (ref 1.005–1.030)
WBC, UA: >50 WBC/hpf — ABNORMAL HIGH (ref 0–5)
pH: 6 (ref 5.0–8.0)

## 2018-09-02 LAB — GLUCOSE, CAPILLARY
Glucose-Capillary: 99 mg/dL (ref 70–99)
Glucose-Capillary: 141 mg/dL — ABNORMAL HIGH (ref 70–99)
Glucose-Capillary: 195 mg/dL — ABNORMAL HIGH (ref 70–99)
Glucose-Capillary: 252 mg/dL — ABNORMAL HIGH (ref 70–99)

## 2018-09-02 LAB — BASIC METABOLIC PANEL
Anion gap: 10 (ref 5–15)
BUN: 20 mg/dL (ref 8–23)
CO2: 24 mmol/L (ref 22–32)
Calcium: 8.5 mg/dL — ABNORMAL LOW (ref 8.9–10.3)
Chloride: 104 mmol/L (ref 98–111)
Creatinine, Ser: 0.76 mg/dL (ref 0.61–1.24)
GFR calc Af Amer: >60 mL/min (ref >60)
GFR calc non Af Amer: >60 mL/min (ref >60)
Glucose, Bld: 102 mg/dL — ABNORMAL HIGH (ref 70–99)
Potassium: 4.7 mmol/L (ref 3.5–5.1)
Sodium: 138 mmol/L (ref 135–145)

## 2018-09-02 LAB — PREALBUMIN: Prealbumin: 17.7 mg/dL — ABNORMAL LOW (ref 18–38)

## 2018-09-02 NOTE — Progress Notes (Signed)
[place] PHYSICAL MEDICINE & REHABILITATION PROGRESS NOTE   Subjective/Complaints: No new issues. Sleeping when I arrived. No pain.   ROS: Limited due to cognitive/behavioral   Objective:   No results found. No results for input(s): WBC, HGB, HCT, PLT in the last 72 hours. Recent Labs    09/02/18 0536  NA 138  K 4.7  CL 104  CO2 24  GLUCOSE 102*  BUN 20  CREATININE 0.76  CALCIUM 8.5*    Intake/Output Summary (Last 24 hours) at 09/02/2018 0900 Last data filed at 09/01/2018 2302 Gross per 24 hour  Intake 444 ml  Output 1075 ml  Net -631 ml     Physical Exam: Vital Signs Blood pressure 130/64, pulse 75, temperature (!) 97.3 F (36.3 C), temperature source Oral, resp. rate 12, height 6' (1.829 m), weight 97.3 kg, SpO2 100 %.  Constitutional: No distress . Vital signs reviewed. HEENT: EOMI, oral membranes moist Neck: supple Cardiovascular: RRR without murmur. No JVD    Respiratory: CTA Bilaterally without wheezes or rales. Normal effort    GI: BS +, non-tender, non-distended  Musculoskeletal:  Lower extremity edema tr BLE Neurological. [name], slightly confused. Motor: Right upper extremity: 4+/5 proximal distal Left upper extremity: 5/5 proximal distal Bilateral lower extremities 2/5 RLE, 3- to 3/5 LLE Skin: Skin is warm and dry.  Surgical site   clean and dry  Psychiatric: flat     Assessment/Plan: 1. Functional deficits secondary to cervical myelopathy which require 3+ hours per day of interdisciplinary therapy in a comprehensive inpatient rehab setting.  Physiatrist is providing close team supervision and 24 hour management of active medical problems listed below.  Physiatrist and rehab team continue to assess barriers to discharge/monitor patient progress toward functional and medical goals  Care Tool:  Bathing    Body parts bathed by patient: Right arm, Left arm, Chest, Abdomen, Front perineal area, Right upper leg, Left upper leg, Face   Body parts  bathed by helper: Buttocks, Left lower leg, Right lower leg     Bathing assist Assist Level: Moderate Assistance - Patient 50 - 74%     Upper Body Dressing/Undressing Upper body dressing   What is the patient wearing?: Pull over shirt    Upper body assist Assist Level: Moderate Assistance - Patient 50 - 74%    Lower Body Dressing/Undressing Lower body dressing      What is the patient wearing?: Pants     Lower body assist Assist for lower body dressing: Maximal Assistance - Patient 25 - 49%     Toileting Toileting    Toileting assist Assist for toileting: Total Assistance - Patient < 25%     Transfers Chair/bed transfer  Transfers assist  Chair/bed transfer activity did not occur: Safety/medical concerns  Chair/bed transfer assist level: Minimal Assistance - Patient > 75%     Locomotion Ambulation   Ambulation assist   Ambulation activity did not occur: Safety/medical concerns          Walk 10 feet activity   Assist  Walk 10 feet activity did not occur: Safety/medical concerns        Walk 50 feet activity   Assist Walk 50 feet with 2 turns activity did not occur: Safety/medical concerns         Walk 150 feet activity   Assist Walk 150 feet activity did not occur: Safety/medical concerns         Walk 10 feet on uneven surface  activity   Assist Walk 10  feet on uneven surfaces activity did not occur: Safety/medical concerns         Wheelchair     Assist Will patient use wheelchair at discharge?: Yes Type of Wheelchair: Manual Wheelchair activity did not occur: Safety/medical concerns  Wheelchair assist level: Supervision/Verbal cueing Max wheelchair distance: 150'    Wheelchair 50 feet with 2 turns activity    Assist    Wheelchair 50 feet with 2 turns activity did not occur: Safety/medical concerns   Assist Level: Supervision/Verbal cueing   Wheelchair 150 feet activity     Assist Wheelchair 150 feet  activity did not occur: Safety/medical concerns   Assist Level: Supervision/Verbal cueing     Medical Problem List and Plan: 1.  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 Cervical collar at all times  -Interdisciplinary Team Conference today    2.  DVT Prophylaxis/Anticoagulation: SCDs. Vascular study negative  -  lovenox 30mg  q12   3. Pain Management:  Oxycodone and Valium as needed.  -right ankle pain/swelling. 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 Mood:  Provide emotional support 5. Neuropsych: This patient is capable of making decisions on his own behalf. 6. Skin/Wound Care:  Routine skin checks 7. Fluids/Electrolytes/Nutrition, acute renal insufficiency:    Off ivf  -continue megace, appetite picking up gradually/borderline  -push po  -holding lisinopril and metformin,  -prealbumin low 17.7   -ask RD for further recs    8. Diabetes mellitus. Glucophage 1500 mg daily, Actos 15 mg daily, Amaryl 8 mg daily.   -glucophage held. reduced Amaryl : increased to 4mg  with improved control so far 9. Hypertension. Lisinopril 40 mg daily, clonidine 0.1 mg twice a day, Norvasc 10 mg daily.   -held lisinopril per above  -controlled 12/16  -no changes today 10. Hyperlipidemia. Lipitor 11. Constipation. Laxative assistance 12. Leukocytosis:  Resolved but malodorous urine  -check ua/ucx today 13. Colonic ileus:   -belly improved   -stools now regular and loose---off reglan, hold sennokot  -advanced to regular diet  -continue OOB  -supp K+ and magnesium---potassium normal today       LOS: 14 days A FACE TO FACE EVALUATION WAS PERFORMED  Ranelle Oyster 09/02/2018, 9:00 AM

## 2018-09-02 NOTE — Progress Notes (Addendum)
Occupational Therapy Session Note  Patient Details  Name: Brandon Collins MRN: [id number] Date of Birth: 1950-11-06  Today's Date: 09/02/2018 OT Individual Time: [id number] OT Individual Time Calculation (min): 58 min    Short Term Goals: Week 2:  OT Short Term Goal 1 (Week 2): Pt will maintain sitting balance for 5 minutes at mod A or better.  OT Short Term Goal 2 (Week 2): Pt will engaged in functional task for 10 minutes before needing rest break secondary to fatigue.  OT Short Term Goal 3 (Week 2): Pt will perform UB dressing with min A sitting on EOB.  OT Short Term Goal 4 (Week 2): Pt will be able to utilize STEDY with max A of 2 for shower or toilet transfer.   Skilled Therapeutic Interventions/Progress Updates:  Upon entering the room, pt seated in wheelchair awaiting OT arrival with no c/o pain this session. Pt agreeable to OT intervention and appeared much more alert today. Pt propelled wheelchair 120' with increased time and min verbal cuing for technique when turning. Pt attending the patient christmas party and obtaining food items from wheelchair level. OT educated pt on how he could safely transport food items at home from one area to the next and safety concerns to be aware of. Pt verbalized understanding but education to continue. Pt requesting to return to bed. Pt verbalizing steps for slide board transfer to bed with min cuing and assist to place board properly. Min A from wheelchair >bed and pt engaged in static sitting balance with L UE support at overall supervision level. Pt required mod A for B LEs for sit >supine. Call bell and all needed items within reach. Pt requesting all 4 rails up and bed alarm activated as OT exited the room.  Therapy Documentation Precautions:  Precautions Precautions: Cervical, Fall Precaution Comments: Reviewed cervical precautions Required Braces or Orthoses: Cervical Brace Cervical Brace: Hard collar, At all times Restrictions Weight Bearing  Restrictions: No General:   Vital Signs: Therapy Vitals Temp: 98.2 F (36.8 C) Temp Source: Oral Pulse Rate: 85 Resp: 18 BP: 121/62 Patient Position (if appropriate): Lying Oxygen Therapy SpO2: 97 % O2 Device: Room Air Pain: Pain Assessment Pain Score: 0-No pain   Therapy/Group: Individual Therapy  [name], [name] P 09/02/2018, 4:06 PM

## 2018-09-02 NOTE — Patient Care Conference (Signed)
Inpatient RehabilitationTeam Conference and Plan of Care Update Date: 09/02/2018   Time: 2:10 PM    Patient Name: Brandon OchsCarl S Collins      Medical Record Number: [id number]  Date of Birth: Jul 04, 1951 Sex: Male         Room/Bed: 4W08C/4W08C-01 Payor Info: Payor: [employer] / Plan: CIGNA MANAGED / Product Type: *No Product type* /    Admitting Diagnosis: ACDF with myelopathy  Admit Date/Time:  08/19/2018 11:36 AM Admission Comments: No comment available   Primary Diagnosis:  <principal problem not specified> Principal Problem: <principal problem not specified>  Patient Active Problem List   Diagnosis Date Noted  . Pain   . Cervical myelopathy (HCC) 08/19/2018  . Cervical cord compression with myelopathy (HCC)   . Postoperative pain   . Diabetes mellitus type 2 in nonobese (HCC)   . Essential hypertension   . Dyslipidemia   . Drug induced constipation   . Myelopathy (HCC) 08/12/2018    Expected Discharge Date: Expected Discharge Date: 09/13/18  Team Members Present: Physician leading conference: Dr. Faith RogueZachary Swartz Social Worker Present: Amada JupiterLucy Kerilyn Cortner, LCSW Nurse Present: Chana Bodeeborah Sharp, RN PT Present: Other (comment)(Taylor Serita Griturkalo, PT) OT Present: Jackquline DenmarkKatie [name], OT SLP Present: Colin BentonMadison Cratch, SLP PPS Coordinator present : Fae PippinMelissa Bowie;Tora DuckMarie Noel, RN, CRRN     Current Status/Progress Goal Weekly Team Focus  Medical   Ileus slowly improving.  Appetite picking up.  Emptying bowels now.  Still some ongoing confusion.  Pain seems controlled  Optimize nutrition and functionality of bowels  See above   Bowel/Bladder   Cont B/B, episodes of incont, LBM 09/01/18  Maintain regular bowel pattern. Maintain continence of bowel and bladder  Assist with toileting needs as needed.   Swallow/Nutrition/ Hydration   regular and thin, Supervision  mod I   toleration of diet and swallow strategies    ADL's   Mod A bathing bedlevel, Mod A UB dressing, Max A LB dressing, Min A slideboard transfers   Min  A overall   NMR, sit<stands, cognitive remediation, general strengthening and endurance, pt education, UE coordination    Mobility   min to mod A bed mobility, min A SB transfers, S w/c mobility, assist x 2 to stand in stedy  min A bed mobility and transfers, setup A w/c mobility, may upgrade to Supervision  increasing independence with bed mobility and SB transfers, sit to stand transfer   Communication             Safety/Cognition/ Behavioral Observations  Pending evel  Pending eval       Pain   C/O neck pain. administerecf pain regim   Maintain pain level <3 on scale of 1-10  Assess pain every shift and as needed   Skin   surgical incision to neck with staples intact,, area clean and dry  Maintain skin integrity, no impairment and no s/sx of infection  Assess skin every shift and address issues appropriately      *See Care Plan and progress notes for long and short-term goals.     Barriers to Discharge  Current Status/Progress Possible Resolutions Date Resolved   Physician    Medical stability  Altered mental status     Continue to improve medical condition      Nursing                  PT                    OT  SLP                SW                Discharge Planning/Teaching Needs:  Pt to d/c home with wife who is planning to cover 24/7 support.  Teaching still to be completed.   Team Discussion:  Ileus improving and better p.o. intake but need to encourage.  Cognition some improved but still with poor ST recall and ST to continue following to address these concerns.  incont of bladder.  Mod assist with b/d.  Min assist with slide board tfs.  Plan to upgrade most goals to supervision w/c level.  Revisions to Treatment Plan:  NA    Continued Need for Acute Rehabilitation Level of Care: The patient requires daily medical management by a physician with specialized training in physical medicine and rehabilitation for the following conditions: Daily  direction of a multidisciplinary physical rehabilitation program to ensure safe treatment while eliciting the highest outcome that is of practical value to the patient.: Yes Daily medical management of patient stability for increased activity during participation in an intensive rehabilitation regime.: Yes Daily analysis of laboratory values and/or radiology reports with any subsequent need for medication adjustment of medical intervention for : Post surgical problems;Neurological problems;Urological problems;Nutritional problems   I attest that I was present, lead the team conference, and concur with the assessment and plan of the team.   Anvith Mauriello 09/02/2018, 3:49 PM

## 2018-09-02 NOTE — Plan of Care (Signed)
  Problem: RH SAFETY Goal: RH STG ADHERE TO SAFETY PRECAUTIONS W/ASSISTANCE/DEVICE Description STG Adhere to Safety Precautions With Assistance/Device. Outcome: Progressing  Call light within reach, bed alarm, proper footwear, telesitter

## 2018-09-02 NOTE — Progress Notes (Signed)
Physical Therapy Session Note  Patient Details  Name: Brandon OchsCarl S Kaminski MRN: [id number] Date of Birth: 03/02/1951  Today's Date: 09/02/2018 PT Individual Time: 1015-1110 PT Individual Time Calculation (min): 55 min   Short Term Goals: Week 2:  PT Short Term Goal 1 (Week 2): Pt will complete bed mobility with max A x 1 consistently PT Short Term Goal 2 (Week 2): Pt will perform least restrictive transfer with assist x 1 consistently PT Short Term Goal 3 (Week 2): Pt will propel manual w/c x 150 ft with min A  Skilled Therapeutic Interventions/Progress Updates:    Pt received seated in w/c in room, reports need to have a bowel movement. No complaints of pain. Sliding board transfer w/c to platform commode over toilet with min A. Pt is able to lean L/R with SBA for dependent doffing of pants and brief. Pt is able to come to half/stand while seated on commode with min A for dependent pericare and donning of new brief and pants. Sliding board transfer commode to w/c with min A. Pt reports feeling significantly fatigued following toileting and declines to participate in any further transfers this session. Manual w/c propulsion x 150 ft with BUE and Supervision. UBE x 4 min fwd/back on level 13 for BUE global endurance training. Pt left seated in w/c in room with needs in reach at end of therapy session.  Therapy Documentation Precautions:  Precautions Precautions: Cervical, Fall Precaution Comments: Reviewed cervical precautions Required Braces or Orthoses: Cervical Brace Cervical Brace: Hard collar, At all times Restrictions Weight Bearing Restrictions: No   Therapy/Group: Individual Therapy  Peter Congoaylor Marcine Gadway, PT, DPT  09/02/2018, 2:03 PM

## 2018-09-02 NOTE — Progress Notes (Signed)
Speech Language Pathology Daily Session Note  Patient Details  Name: Brandon OchsCarl S Flythe MRN: [id number] Date of Birth: 03-06-1951  Today's Date: 09/02/2018 SLP Individual Time: [id number] SLP Individual Time Calculation (min): 45 min  Short Term Goals: Week 1: SLP Short Term Goal 1 (Week 1): STG=LTG due to anticipated short length of stay for ST  Skilled Therapeutic Interventions:Skilled ST services focused on swallow skills. Pt had wet brief and bed upon entering, SLP assistance pt with changing brief, required min-supervision A verbal cues for basic problem solving and notified nurse tech that sheets need to be changes. SLP facilitated PO consumption of regular textures and thin liquid via straw breakfast tray , pt required min-supervision A verbal cues to follow swallow precautions and demonstrated immediate throat clear on 30% of trials (mixtures of solids and thins), suggested due to edema, however recommend to continue full supervision. SLP facilitated orientation skills, pt demonstrated ability to recall situation and place however max A verbal cues for time given visual aid (unable to state what month or year after reading the date 09/02/18.) Pt demonstrates continued impairment in safety awareness, stating he could get up on his own when asked and unable demonstrate immediate recall of 4/5 words. Pt was left in room with call bell within reach and bed alarm set. SLP requested cognitive evaluation at conference and tomorrow will assess further cognitive linguistic skills. SLP reccomends to continue skilled services.     Pain Pain Assessment Pain Score: 0-No pain  Therapy/Group: Individual Therapy  Kortez Murtagh  [facility] CountyCRATCH 09/02/2018, 3:03 PM

## 2018-09-02 NOTE — Progress Notes (Signed)
Physical Therapy Session Note  Patient Details  Name: Brandon Collins MRN: [id number] Date of Birth: October 27, 1950  Today's Date: 09/02/2018 PT Individual Time: 0910-0930 PT Individual Time Calculation (min): 20 min   Short Term Goals: Week 1:  PT Short Term Goal 1 (Week 1): Pt will complete bed mobility with max A x 1 consistently PT Short Term Goal 1 - Progress (Week 1): Progressing toward goal PT Short Term Goal 2 (Week 1): Pt will maintain sitting balance with Supervision PT Short Term Goal 2 - Progress (Week 1): Met PT Short Term Goal 3 (Week 1): Pt will tolerate sitting OOB x 1 hour PT Short Term Goal 3 - Progress (Week 1): Met  Skilled Therapeutic Interventions/Progress Updates:   Pt missed 10 min of skilled PT, was on bed pan. Received on bed pan and agreeable to therapy, denies pain. Supervision rolling R/L to remove bed pan, unsuccessful attempt at [facility]. Total assist to don pants w/ verbal and manual cues for supine bridge while therapist pulled pants over hips. Mod assist transfer to EOB and min assist slide board transfer. Pt initiating technique of transfer w/o cues from therapist. Sat up at sink to brush teeth w/ set-up assist. Worked on endurance training and upright tolerance while self-propelling w/c around unit in 150-300' bouts w/ supervision using BUEs. Moderate increase in work of breathing, resolves w/ rest. Returned to room and ended session in w/c, all needs in reach.   Therapy Documentation Precautions:  Precautions Precautions: Cervical, Fall Precaution Comments: Reviewed cervical precautions Required Braces or Orthoses: Cervical Brace Cervical Brace: Hard collar, At all times Restrictions Weight Bearing Restrictions: No General: PT Amount of Missed Time (min): 10 Minutes PT Missed Treatment Reason: Toileting Vital Signs: Therapy Vitals BP: 130/64  Therapy/Group: Individual Therapy  Verdell Dykman K Owyn Raulston 09/02/2018, 9:59 AM

## 2018-09-02 NOTE — Progress Notes (Signed)
Nutrition Follow-up  DOCUMENTATION CODES:   Obesity unspecified  INTERVENTION:   - Recommend liberalizing diet to Regular in an effort to increase PO intake  - Continue Ensure Enlive po TID, each supplement provides 350 kcal and 20 grams of protein (any flavor except strawberry)  - Magic cup BID with meals, each supplement provides 290 kcal and 9 grams of protein  - Continue snacks BID between meals  NUTRITION DIAGNOSIS:   Inadequate oral intake related to altered GI function as evidenced by meal completion < 50%, other (clear liquid diet order x 6 days).  Ongoing, diet now advanced to carb mod and PO intake averaging 51%  GOAL:   Patient will meet greater than or equal to 90% of their needs  Progressing, PO intake averaging 51% but increasing over time  MONITOR:   PO intake, Supplement acceptance, Diet advancement, Skin, Labs, Weight trends  REASON FOR ASSESSMENT:   Consult Poor PO  ASSESSMENT:   67 year old male with PMH significant for HTN, T2DM, cervical myelopathy with cord compression, and myelomalacia C3-C7 with progressive decline for the past 6 months. Pt presented 07/2618 with new onset BUE weakness,LLE weakness, and balance deficits with noted falls as well as difficulty with basic ADLs. Pt underwent anterior cervical decompression and fusion C3-4, 4-5, 5-6 and C6-7 as well as placement of anterior instrumentation.  12/3 - admitted to rehab 12/5 - KUB consistent with ileus, clear liquid diet, NGT placed by IR but pt pulled out later, NGT replaced in the evening 12/9 - pt pulled NGT, reinserted, later removed 12/12 - diet advanced to full liquids 12/13 - diet advanced to Carb Mod  Weight down 13 lbs since admission. Suspect this is related to poor PO intake. Will continue to monitor weight trends during admission.  Spoke with pt, wife, and friend at bedside. Pt cheerful, joking around. Pt states, "when you don't have an appetite, you just don't have an  appetite." Pt expresses frustration regarding people encouraging PO intake but "doing stuff that makes me have no appetite" in references to procedures, tests, etc.  Pt states, "I ate a big old stack of cookies before you came in here." Suspect pt is eating food from home in addition to food from meal trays. Pt reports eating a ham sandwich for lunch and tolerating it well. Noted meal completion 75% at both breakfast and lunch today.  In reference to Ensure VinaEnlive, pt states, "sometimes I get them, sometimes I don't. You can't drink something that you don't get." RD reassured pt that Ensure Enlive is ordered TID. Encouraged pt to ask for Ensure Enlive if it is not provided to him at the times that it is ordered.  RD has ordered Ensure Enlive (pt enjoys chocolate flavor), Magic Cup, and snacks. Also recommend liberalizing diet to Regular to maximize food options at meals.  Meal Completion: 20-75% x last 8 meals (average 51%)  Medications reviewed and include: cholecalciferol, Ensure Enlive TID, Megace 400 mg BID, Protonix, Actos, K-dur 20 mEq BID  Labs reviewed. CBG's: 141, 99, 185, 113 x 24 hours  UOP: 1075 ml x 24 hours  Diet Order:   Diet Order            Diet Carb Modified Fluid consistency: Thin; Room service appropriate? Yes  Diet effective now              EDUCATION NEEDS:   Education needs have been addressed  Skin:  Skin Assessment: Skin Integrity Issues: Stage I: right ankle Incisions:  incision  Last BM:  12/15, type 7  Height:   Ht Readings from Last 1 Encounters:  08/19/18 6' (1.829 m)    Weight:   Wt Readings from Last 1 Encounters:  09/02/18 97.3 kg    Ideal Body Weight:  80.9 kg  BMI:  Body mass index is 29.09 kg/m.  Estimated Nutritional Needs:   Kcal:  2300-2500  Protein:  115-130 grams  Fluid:  >/= 2.3 L    Earma Reading, MS, RD, LDN Inpatient Clinical Dietitian Pager: [phone] Weekend/After Hours: [phone]

## 2018-09-02 NOTE — Plan of Care (Signed)
  Problem: RH SAFETY Goal: RH STG ADHERE TO SAFETY PRECAUTIONS W/ASSISTANCE/DEVICE Description: STG Adhere to Safety Precautions With Assistance/Device. Outcome: Progressing   

## 2018-09-03 ENCOUNTER — Ambulatory Visit (HOSPITAL_COMMUNITY): Payer: Managed Care, Other (non HMO)

## 2018-09-03 ENCOUNTER — Inpatient Hospital Stay (HOSPITAL_COMMUNITY): Payer: Managed Care, Other (non HMO) | Admitting: Physical Therapy

## 2018-09-03 ENCOUNTER — Inpatient Hospital Stay (HOSPITAL_COMMUNITY): Payer: Managed Care, Other (non HMO) | Admitting: Occupational Therapy

## 2018-09-03 LAB — GLUCOSE, CAPILLARY
Glucose-Capillary: 91 mg/dL (ref 70–99)
Glucose-Capillary: 140 mg/dL — ABNORMAL HIGH (ref 70–99)
Glucose-Capillary: 201 mg/dL — ABNORMAL HIGH (ref 70–99)
Glucose-Capillary: 173 mg/dL — ABNORMAL HIGH (ref 70–99)

## 2018-09-03 MED ORDER — CEPHALEXIN 250 MG PO CAPS
250.0000 mg | ORAL_CAPSULE | Freq: Three times a day (TID) | ORAL | Status: AC
  Administered 2018-09-03 – 2018-09-10 (×21): 250 mg via ORAL
  Filled 2018-09-03 (×21): qty 1

## 2018-09-03 NOTE — Progress Notes (Signed)
Speech Language Pathology Weekly Progress and Session Note  Patient Details  Name: Brandon Collins MRN: [id number] Date of Birth: Feb 13, 1951  Beginning of progress report period: August 26, 2018 End of progress report period: September 03, 2018  Today's Date: 09/03/2018 SLP Individual Time: 1212-1221 / 810-900 SLP Individual Time Calculation (min): 9 min and 50 min  Short Term Goals: Week 1: SLP Short Term Goal 1 (Week 1): STG=LTG due to anticipated short length of stay for ST SLP Short Term Goal 1 - Progress (Week 1): Progressing toward goal    New Short Term Goals: Week 2: SLP Short Term Goal 1 (Week 2): Pt will consume regular and thin liquid diet with min overt s/s aspiration and mod I use of swallow strategies. SLP Short Term Goal 2 (Week 2): Pt will demonstrate orientation to place, situation and time with min A verbal cues for use of external aid. SLP Short Term Goal 3 (Week 2): Pt will utilize external memory aids to recall new, mildly complex information with min A verbal cues. SLP Short Term Goal 4 (Week 2): Pt will complete semi-complex problem solving tasks with supervision A verbal cues during functional tasks. SLP Short Term Goal 5 (Week 2): Pt will self-monitor and self-correct functional errors with min A verbal cues. SLP Short Term Goal 6 (Week 2): Pt will demonstrate use of safety precuations in functional tasks with supervision A verbal cues.  Weekly Progress Updates: Pt demonstrated steady progress, progressing towards Mod I for use of swallow strategies, however currently closer to supervision A. Pt is currently tolerating regular textured foods and thin via cup/straw with a reduction in frenquency of throat clears. Pt currently requires full supervision, however likely will require reduced supervision in the upcoming reporting period. SLP facilitated cognitive linguistic evaluation during this reporting period due to increased clarity, but continued noted cognitive  deficits. Pt demonstrated mild-moderate impairment in cognitive function impacting orientation, short term recall, semi-complex problem solving and safety awareness. Pt would benefit from skilled ST services in order to maximize functional independence and reduce burden of care, likely requiring supervision at discharge with continued skilled ST services if mentation does not improve.     Intensity: Minumum of 1-2 x/day, 30 to 90 minutes Frequency: 3 to 5 out of 7 days Duration/Length of Stay: 12/28 Treatment/Interventions: Dysphagia/aspiration precaution training;Cognitive remediation/compensation;Cueing hierarchy;Functional tasks     General    Pain Pain Assessment Pain Scale: 0-10 Pain Score: 0-No pain  Therapy/Group: Individual Therapy  Maite Burlison  [facility] 09/03/2018, 4:55 PM

## 2018-09-03 NOTE — Progress Notes (Signed)
[facility] PHYSICAL MEDICINE & REHABILITATION PROGRESS NOTE   Subjective/Complaints: Up in bed. More alert. Denies pain. Wants to get up  ROS: Patient denies fever, rash, sore throat, blurred vision, nausea, vomiting, diarrhea, cough, shortness of breath or chest pain,  headache, or mood change.    Objective:   No results found. No results for input(s): WBC, HGB, HCT, PLT in the last 72 hours. Recent Labs    09/02/18 0536  NA 138  K 4.7  CL 104  CO2 24  GLUCOSE 102*  BUN 20  CREATININE 0.76  CALCIUM 8.5*    Intake/Output Summary (Last 24 hours) at 09/03/2018 1653 Last data filed at 09/03/2018 1525 Gross per 24 hour  Intake 480 ml  Output 1425 ml  Net -945 ml     Physical Exam: Vital Signs Blood pressure (!) 109/50, pulse 80, temperature 98.3 F (36.8 C), temperature source Oral, resp. rate 18, height 6' (1.829 m), weight 97.2 kg, SpO2 96 %.  Constitutional: No distress . Vital signs reviewed. HEENT: EOMI, oral membranes moist Neck: supple Cardiovascular: RRR without murmur. No JVD    Respiratory: CTA Bilaterally without wheezes or rales. Normal effort    GI: BS +, non-tender, non-distended  Musculoskeletal:  Lower extremity edema tr BLE Neurological. Alert, improved insight and awareness. Motor: Right upper extremity: 4+/5 proximal distal Left upper extremity: 5/5 proximal distal Bilateral lower extremities 2/5 RLE, 3- to 3/5 LLE Skin: Skin is warm and dry.  Surgical site remains  clean and dry  Psychiatric: more alert and animated     Assessment/Plan: 1. Functional deficits secondary to cervical myelopathy which require 3+ hours per day of interdisciplinary therapy in a comprehensive inpatient rehab setting.  Physiatrist is providing close team supervision and 24 hour management of active medical problems listed below.  Physiatrist and rehab team continue to assess barriers to discharge/monitor patient progress toward functional and medical goals  Care  Tool:  Bathing    Body parts bathed by patient: Right arm, Left arm, Chest, Abdomen, Front perineal area, Right upper leg, Left upper leg, Face   Body parts bathed by helper: Buttocks, Left lower leg, Right lower leg     Bathing assist Assist Level: Moderate Assistance - Patient 50 - 74%     Upper Body Dressing/Undressing Upper body dressing   What is the patient wearing?: Pull over shirt    Upper body assist Assist Level: Moderate Assistance - Patient 50 - 74%    Lower Body Dressing/Undressing Lower body dressing      What is the patient wearing?: Pants     Lower body assist Assist for lower body dressing: Maximal Assistance - Patient 25 - 49%     Toileting Toileting    Toileting assist Assist for toileting: Total Assistance - Patient < 25%     Transfers Chair/bed transfer  Transfers assist  Chair/bed transfer activity did not occur: Safety/medical concerns  Chair/bed transfer assist level: Minimal Assistance - Patient > 75%     Locomotion Ambulation   Ambulation assist   Ambulation activity did not occur: Safety/medical concerns          Walk 10 feet activity   Assist  Walk 10 feet activity did not occur: Safety/medical concerns        Walk 50 feet activity   Assist Walk 50 feet with 2 turns activity did not occur: Safety/medical concerns         Walk 150 feet activity   Assist Walk 150 feet activity  did not occur: Safety/medical concerns         Walk 10 feet on uneven surface  activity   Assist Walk 10 feet on uneven surfaces activity did not occur: Safety/medical concerns         Wheelchair     Assist Will patient use wheelchair at discharge?: Yes Type of Wheelchair: Manual Wheelchair activity did not occur: Safety/medical concerns  Wheelchair assist level: Supervision/Verbal cueing Max wheelchair distance: 150'    Wheelchair 50 feet with 2 turns activity    Assist    Wheelchair 50 feet with 2 turns  activity did not occur: Safety/medical concerns   Assist Level: Supervision/Verbal cueing   Wheelchair 150 feet activity     Assist Wheelchair 150 feet activity did not occur: Safety/medical concerns   Assist Level: Supervision/Verbal cueing     Medical Problem List and Plan: 1.  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 Cervical collar at all times  -Continue CIR therapies including PT, OT   2.  DVT Prophylaxis/Anticoagulation: SCDs. Vascular study negative  -  lovenox 30mg  q12   3. Pain Management:  Oxycodone and Valium as needed.  -right ankle pain/swelling. 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 Mood:  Provide emotional support 5. Neuropsych: This patient is capable of making decisions on his own behalf. 6. Skin/Wound Care:  Routine skin checks 7. Fluids/Electrolytes/Nutrition, acute renal insufficiency:    Off ivf  -continue megace, appetite picking up gradually/borderline  -push po  -holding lisinopril and metformin,  -liberalized diet per RD recs 8. Diabetes mellitus. Glucophage 1500 mg daily, Actos 15 mg daily, Amaryl 8 mg daily.   -glucophage held. reduced Amaryl : increased to 4mg  with improved control so far  -watch cbg's with diet change and better intake 9. Hypertension. Lisinopril 40 mg daily, clonidine 0.1 mg twice a day, Norvasc 10 mg daily.   -held lisinopril per above  -controlled 12/18  -no changes  10. Hyperlipidemia. Lipitor 11. Constipation. Laxative assistance 12. Leukocytosis:  Resolved but malodorous urine  -ucx + for 100k e coli  -begin empiric keflex 13. Colonic ileus:   -belly improved   -stools now regular and loose---off reglan, hold sennokot  -advanced to regular diet  -continue OOB  -supp K+ and magnesium--      LOS: 15 days A FACE TO FACE EVALUATION WAS PERFORMED  Ranelle Oyster 09/03/2018, 4:53 PM

## 2018-09-03 NOTE — Evaluation (Signed)
Speech Language Pathology Assessment and Plan  Patient Details  Name: Brandon Collins MRN: [id number] Date of Birth: 30-Nov-1950  SLP Diagnosis: Dysphagia;Cognitive Impairments  Rehab Potential: Good ELOS: 12/28    Today's Date: 09/03/2018 SLP Individual Time:  -  55-900 / 1210-1221 50 min and 10 min     Problem List:  Patient Active Problem List   Diagnosis Date Noted  . Pain   . Cervical myelopathy ([place]) 08/19/2018  . Cervical cord compression with myelopathy ([place])   . Postoperative pain   . Diabetes mellitus type 2 in nonobese ([facility])   . Essential hypertension   . Dyslipidemia   . Drug induced constipation   . Myelopathy ([place]) 08/12/2018   Past Medical History:  Past Medical History:  Diagnosis Date  . Arthritis    "all over" (08/12/2018)  . High cholesterol   . Hypertension   . Type II diabetes mellitus ([place])    Past Surgical History:  Past Surgical History:  Procedure Laterality Date  . ANTERIOR CERVICAL DECOMP/DISCECTOMY FUSION  08/12/2018   CERVICAL 3-4, CERVICAL 4-5, CERVICAL 5-6, CERVICAL 6-7 WITH INSTRUMENTATION AND ALLOGRAFT  . ANTERIOR CERVICAL DECOMPRESSION/DISCECTOMY FUSION 4 LEVELS N/A 08/12/2018   Procedure: ANTERIOR CERVICAL DECOMPRESSION FUSION CERVICAL 3-4, CERVICAL 4-5, CERVICAL 5-6, CERVICAL 6-7 WITH INSTRUMENTATION AND ALLOGRAFT;  Surgeon: Brandon Bob, MD;  Location: [facility];  Service: Orthopedics;  Laterality: N/A;  ANTERIOR CERVICAL DECOMPRESSION FUSION CERVICAL 3-4, CERVICAL 4-5, CERVICAL 5-6, CERVICAL 6-7 WITH INSTRUMENTATION AND ALLOGRAFT  . BACK SURGERY    . [place]   "? side"  . KNEE ARTHROSCOPY Right   . PILONIDAL CYST EXCISION      Assessment / Plan / Recommendation Clinical Impression Brandon Collins is a 67 year old right-handed male history of hypertension, type 2 diabetes mellitus, cervical myelopathy with cord compression and myelomalacia C3-C7 with progressive decline for the past 6 months. Per chart review and  patient, patient lives with spouse. One level home 3 steps to entry. Independent working up until 2 weeks ago.presented 08/12/2018 with new onset of bilateral upper extremity weakness, left lower extremity weakness and balance deficits with noted falls as well as difficulty with basic ADLs.underwent anterior cervical decompression and fusion C3-4, 4-5, 5-6 and C6-7 as well as placement of anterior instrumentation per Dr. Lynann Collins. Hospital course pain management. Cervical collar at all times. Therapy evaluations completed with recommendations of physical medicine rehabilitation consult. Patient was admitted for a comprehensive rehabilitation program.  Pt presents with mild-moderate cognitive impairments, deficits include orientation, short term recall, semi-complex problem solving, emergent awareness correcting errors and safety awareness, which was further supported by formal cognitive linguistic assessment utilizing Cognistat. SLP adminstered cognitive linguistic assessment following continued impairment in mentation. SLP will continued to address swallowing goals and has added cognitive goals, therefore pt will likely require supervision and continued ST services upon discharge. Pt would benefit from skilled ST services in order to maximize functional independence and reduce burden of care.   Skilled Therapeutic Interventions          Skilled ST services focused on swallow and cognitive skills. SLP facilitated orientation of time, given calendar pt required max  A fading to mod A verbal cues. SLP facilitated PO consumption of thin via straw and dys 3 (regular tray) with min delayed throat clearing noted.  Pt was left in room with call bell within reach, nurse tech in room and chair alarm set. SLP reccomends to continue skilled services.   SLP Assessment  Patient  will need skilled [facility] during CIR admission    Recommendations  Patient destination: Home Follow up  Recommendations: 24 hour supervision/assistance;Home Health SLP;Outpatient SLP Equipment Recommended: None recommended by SLP    SLP Frequency 3 to 5 out of 7 days   SLP Duration  SLP Intensity  SLP Treatment/Interventions 12/28  Minumum of 1-2 x/day, 30 to 90 minutes  Dysphagia/aspiration precaution training;Cognitive remediation/compensation;Cueing hierarchy;Functional tasks    Pain Pain Assessment Pain Scale: 0-10 Pain Score: 0-No pain  Prior Functioning Cognitive/Linguistic Baseline: Information not available(family not present to confirm baseline) Type of Home: House  Lives With: Spouse Available Help at Discharge: Family;Available PRN/intermittently Vocation: Full time employment  Short Term Goals: Week 2: SLP Short Term Goal 1 (Week 2): Pt will consume regular and thin liquid diet with min overt s/s aspiration and mod I use of swallow strategies. SLP Short Term Goal 2 (Week 2): Pt will demonstrate orientation to place, situation and time with min A verbal cues for use of external aid. SLP Short Term Goal 3 (Week 2): Pt will utilize external memory aids to recall new, mildly complex information with min A verbal cues. SLP Short Term Goal 4 (Week 2): Pt will complete semi-complex problem solving tasks with supervision A verbal cues during functional tasks. SLP Short Term Goal 5 (Week 2): Pt will self-monitor and self-correct functional errors with min A verbal cues. SLP Short Term Goal 6 (Week 2): Pt will demonstrate use of safety precuations in functional tasks with supervision A verbal cues.  Refer to Care Plan for Long Term Goals  Recommendations for other services: Neuropsych  Discharge Criteria: Patient will be discharged from SLP if patient refuses treatment 3 consecutive times without medical reason, if treatment goals not met, if there is a change in medical status, if patient makes no progress towards goals or if patient is discharged from hospital.  The above  assessment, treatment plan, treatment alternatives and goals were discussed and mutually agreed upon: by patient  Brandon Collins  [facility] 09/03/2018, 4:58 PM

## 2018-09-03 NOTE — Progress Notes (Signed)
Physical Therapy Weekly Progress Note  Patient Details  Name: RISHABH RINKENBERGER MRN: [id number] Date of Birth: [birth year]  Beginning of progress report period: August 27, 2018 End of progress report period: September 03, 2018  Today's Date: 09/03/2018 PT Individual Time: 0900-1015 PT Individual Time Calculation (min): 75 min   Patient has met 3 of 3 short term goals.  Pt has made great improvements over the past week with improved alertness and ability to participate in therapy session. Pt performs bed mobility with mod A at most, sliding board and squat pivot transfers with CGA to min A, w/c mobility with Supervision, and sit to stand with max to total A x 2 with use of stedy.  Patient continues to demonstrate the following deficits muscle weakness, abnormal tone, unbalanced muscle activation and decreased coordination, decreased initiation, decreased problem solving, decreased safety awareness, decreased memory and delayed processing and decreased sitting balance, decreased standing balance, decreased postural control and decreased balance strategies and therefore will continue to benefit from skilled PT intervention to increase functional independence with mobility.  Patient progressing toward long term goals..  Continue plan of care.  PT Short Term Goals Week 2:  PT Short Term Goal 1 (Week 2): Pt will complete bed mobility with max A x 1 consistently PT Short Term Goal 1 - Progress (Week 2): Met PT Short Term Goal 2 (Week 2): Pt will perform least restrictive transfer with assist x 1 consistently PT Short Term Goal 2 - Progress (Week 2): Met PT Short Term Goal 3 (Week 2): Pt will propel manual w/c x 150 ft with min A PT Short Term Goal 3 - Progress (Week 2): Met Week 3:  PT Short Term Goal 1 (Week 3): Pt will complete bed mobility with min A consistently PT Short Term Goal 2 (Week 3): Pt will complete least restrictive transfer with Supervision consistently PT Short Term Goal 3 (Week 3):  Pt will perform w/c mobility and parts management with Supervision consistently  Skilled Therapeutic Interventions/Progress Updates:  Pt received seated in bed, agreeable to PT session. No complaints of pain. Rolling L/R with min A and skilled cueing for UE/LE placement during transfer. Pt is dependent to don TED hose and pants. Supine to sit with mod A for trunk control and LE management. Squat pivot transfer bed to w/c with min A. Pt is initially apprehensive about transfer without use of sliding board but performs transfer well and has improved confidence once completed. Broke down steps of w/c setup prior to initiating transfer. Asked patient if he can remember the steps, he states it will "be alright" and he could "figure it out", however pt not able to verbalize steps without cueing. Wrote out steps for patient and then guided him through steps verbally as he performed them. Pt will likely require Supervision from his wife at d/c in order to complete transfers safely. Manual w/c propulsion x 150 ft with BUE and Supervision. Squat pivot transfer w/c to/from mat table with min A. Sit to/from supine with mod A, focus on B UE placement to assist with transfer. Seated BLE strengthening therex x 10 reps: marches, LAQ, heel/toe raises. Pt left seated in w/c in room with needs in reach, quick release belt and chair alarm in place, telesitter present.  Therapy Documentation Precautions:  Precautions Precautions: Cervical, Fall Precaution Comments: Reviewed cervical precautions Required Braces or Orthoses: Cervical Brace Cervical Brace: Hard collar, At all times Restrictions Weight Bearing Restrictions: No   Therapy/Group: Individual Therapy  Excell Seltzer, PT, DPT 09/03/2018, 12:13 PM

## 2018-09-03 NOTE — Progress Notes (Signed)
Occupational Therapy Session Note  Patient Details  Name: Geralynn OchsCarl S Sabo MRN: [id number] Date of Birth: 08-27-1951  Today's Date: 09/03/2018 OT Individual Time: 1100-1156 OT Individual Time Calculation (min): 56 min    Short Term Goals: Week 2:  OT Short Term Goal 1 (Week 2): Pt will maintain sitting balance for 5 minutes at mod A or better.  OT Short Term Goal 2 (Week 2): Pt will engaged in functional task for 10 minutes before needing rest break secondary to fatigue.  OT Short Term Goal 3 (Week 2): Pt will perform UB dressing with min A sitting on EOB.  OT Short Term Goal 4 (Week 2): Pt will be able to utilize STEDY with max A of 2 for shower or toilet transfer.   Skilled Therapeutic Interventions/Progress Updates:    Upon entering the room, pt seated in wheelchair awaiting OT arrival. Pt agreeable to OT intervention and propelling wheelchair 150' into day room with supervision overall. Pt placed into standing frame and able to stand for 4 minutes and 3 minutes respectively. Pt did report increased pain in R knee while standing but has not been able to tolerate standing frame until today. Pt taking rest break between bout. BP taken while seated in wheelchair at 140/76 and while standing at 165/80. Pt returning to wheelchair but unable to don leg rests with cuing for technique. Pt propelled self back to room in same manner. Chair belt donned for safety with call bell within reach.   Therapy Documentation Precautions:  Precautions Precautions: Cervical, Fall Precaution Comments: Reviewed cervical precautions Required Braces or Orthoses: Cervical Brace Cervical Brace: Hard collar, At all times Restrictions Weight Bearing Restrictions: No General:   Vital Signs: Therapy Vitals BP: 140/62    Therapy/Group: Individual Therapy  [name], Neven Fina P 09/03/2018, 12:30 PM

## 2018-09-04 ENCOUNTER — Inpatient Hospital Stay (HOSPITAL_COMMUNITY): Payer: Managed Care, Other (non HMO)

## 2018-09-04 ENCOUNTER — Inpatient Hospital Stay (HOSPITAL_COMMUNITY): Payer: Managed Care, Other (non HMO) | Admitting: Physical Therapy

## 2018-09-04 ENCOUNTER — Inpatient Hospital Stay (HOSPITAL_COMMUNITY): Payer: Managed Care, Other (non HMO) | Admitting: Speech Pathology

## 2018-09-04 LAB — BASIC METABOLIC PANEL
Anion gap: 10 (ref 5–15)
BUN: 23 mg/dL (ref 8–23)
CO2: 24 mmol/L (ref 22–32)
Calcium: 9.1 mg/dL (ref 8.9–10.3)
Chloride: 104 mmol/L (ref 98–111)
Creatinine, Ser: 0.73 mg/dL (ref 0.61–1.24)
GFR calc Af Amer: >60 mL/min (ref >60)
GFR calc non Af Amer: >60 mL/min (ref >60)
Glucose, Bld: 103 mg/dL — ABNORMAL HIGH (ref 70–99)
Potassium: 5 mmol/L (ref 3.5–5.1)
Sodium: 138 mmol/L (ref 135–145)

## 2018-09-04 LAB — GLUCOSE, CAPILLARY
Glucose-Capillary: 85 mg/dL (ref 70–99)
Glucose-Capillary: 111 mg/dL — ABNORMAL HIGH (ref 70–99)
Glucose-Capillary: 123 mg/dL — ABNORMAL HIGH (ref 70–99)
Glucose-Capillary: 171 mg/dL — ABNORMAL HIGH (ref 70–99)

## 2018-09-04 LAB — URINE CULTURE: Culture: >=100000 — AB

## 2018-09-04 MED ORDER — TRAZODONE HCL 50 MG PO TABS
50.0000 mg | ORAL_TABLET | Freq: Every evening | ORAL | Status: DC | PRN
  Administered 2018-09-06 – 2018-09-17 (×10): 50 mg via ORAL
  Filled 2018-09-04 (×10): qty 1

## 2018-09-04 MED ORDER — GLIMEPIRIDE 4 MG PO TABS
8.0000 mg | ORAL_TABLET | Freq: Every day | ORAL | Status: DC
  Administered 2018-09-05 – 2018-09-06 (×2): 8 mg via ORAL
  Filled 2018-09-04 (×2): qty 2

## 2018-09-04 NOTE — Progress Notes (Signed)
Occupational Therapy Session Note  Patient Details  Name: Brandon Collins MRN: [id number] Date of Birth: [birth year]  Today's Date: 09/04/2018 OT Individual Time: 1300-1403 OT Individual Time Calculation (min): 63 min    Short Term Goals: Week 2:  OT Short Term Goal 1 (Week 2): Pt will maintain sitting balance for 5 minutes at mod A or better.  OT Short Term Goal 2 (Week 2): Pt will engaged in functional task for 10 minutes before needing rest break secondary to fatigue.  OT Short Term Goal 3 (Week 2): Pt will perform UB dressing with min A sitting on EOB.  OT Short Term Goal 4 (Week 2): Pt will be able to utilize STEDY with max A of 2 for shower or toilet transfer.   Skilled Therapeutic Interventions/Progress Updates:    Session focused on standing tolerance abd B UE coordination. Pt completed 3 sets in the standing frame, with focus on standing tolerance- lasting 2, 3, and minutes respectively. Pt c/o pain in his lower back described as discomfort/aching. Pt requesting to sit frequently, with cueing provided to motivate pt to continue challenging endurance. Pt propelled w/c to therapy gym with (S), 100 ft. Pt completed squat pivot transfer to mat with min A. Pt used stedy to complete sit <> stand transfers, mod A from elevated mat, and pt unable to fully extend legs. Pt completed pipe tree seated on EOM with mod cueing for locating correct pieces and problem solving. Pt returned to room and left sitting up with chair alarm belt fastened and all needs met. Discussion privately with pt's wife re condition, CLOF and potential home remodeling.   Therapy Documentation Precautions:  Precautions Precautions: Cervical, Fall Precaution Comments: Reviewed cervical precautions Required Braces or Orthoses: Cervical Brace Cervical Brace: Hard collar, At all times Restrictions Weight Bearing Restrictions: No   Therapy/Group: Individual Therapy  Curtis Sites 09/04/2018, 3:06 PM

## 2018-09-04 NOTE — Progress Notes (Signed)
Social Work Patient ID: Brandon OchsCarl S Sondgeroth, male   DOB: 04-Sep-1951, 67 y.o.   MRN: [id number]  Have reviewed team conference with pt and wife.  They are aware that team continues to plan toward a 12/28 d/c date and supervision/ min assist w/c level goals.  Wife admits she is still very concerned about his cognition and feels it worsens in the evenings.  She states that this was NOT an issue for pt prior to surgery.  She is also concerned that he is not regaining more function in his LEs and I have stressed to her that goals are for w/c level.  Will plan for family ed to take place next week and have asked wife to let me know if she has concerns about managing his care at home.    Ellan Tess, LCSW

## 2018-09-04 NOTE — Progress Notes (Signed)
Physical Therapy Session Note  Patient Details  Name: Brandon OchsCarl S Mohl MRN: [id number] Date of Birth: Jan 23, 1951  Today's Date: 09/04/2018 PT Individual Time: 0900-1000 PT Individual Time Calculation (min): 60 min   Short Term Goals: Week 3:  PT Short Term Goal 1 (Week 3): Pt will complete bed mobility with min A consistently PT Short Term Goal 2 (Week 3): Pt will complete least restrictive transfer with Supervision consistently PT Short Term Goal 3 (Week 3): Pt will perform w/c mobility and parts management with Supervision consistently  Skilled Therapeutic Interventions/Progress Updates:    Pt received seated in bed, agreeable to PT. Min A for rolling L/R with use of bedrails for dependent donning of brief and mod A to don pants. Supine to sit with min A and max cueing for BUE placement and weight shift during transfer. Squat pivot transfer bed to w/c with min A. Manual w/c propulsion x 150 ft with BUE and Supervision. Reviewed steps of safe w/c setup for transfer w/c to/from therapy mat. Pt requires decreased cuing this date to follow steps for transfer, pt performs w/c parts management with min A and squat pivot transfer with CGA. Sit to/from supine with min A and cueing for use of BUE to assist with transfer. Pt reports urge to have a bowel movement. Squat pivot transfer w/c to commode over toilet with min A. Pt is able to come to a half-stand with min A for dependent clothing management and pericare. Squat pivot transfer back to w/c with min A. Pt left seated in w/c in room with needs in reach and quick release belt and chair alarm in place. Pt given home measurement sheet to give to his wife, will follow up as needed.  Therapy Documentation Precautions:  Precautions Precautions: Cervical, Fall Precaution Comments: Reviewed cervical precautions Required Braces or Orthoses: Cervical Brace Cervical Brace: Hard collar, At all times Restrictions Weight Bearing Restrictions:  No  Therapy/Group: Individual Therapy  Peter Congoaylor Kennieth Plotts, PT, DPT  09/04/2018, 12:22 PM

## 2018-09-04 NOTE — Plan of Care (Signed)
  Problem: RH SAFETY Goal: RH STG ADHERE TO SAFETY PRECAUTIONS W/ASSISTANCE/DEVICE Description: STG Adhere to Safety Precautions With Assistance/Device. Outcome: Progressing   

## 2018-09-04 NOTE — Progress Notes (Signed)
Speech Language Pathology Daily Session Note  Patient Details  Name: Brandon Collins MRN: [id number] Date of Birth: November 30, 1950  Today's Date: 09/04/2018 SLP Individual Time: 1100-1200 SLP Individual Time Calculation (min): 60 min  Short Term Goals: Week 2: SLP Short Term Goal 1 (Week 2): Pt will consume regular and thin liquid diet with min overt s/s aspiration and mod I use of swallow strategies. SLP Short Term Goal 2 (Week 2): Pt will demonstrate orientation to place, situation and time with min A verbal cues for use of external aid. SLP Short Term Goal 3 (Week 2): Pt will utilize external memory aids to recall new, mildly complex information with min A verbal cues. SLP Short Term Goal 4 (Week 2): Pt will complete semi-complex problem solving tasks with supervision A verbal cues during functional tasks. SLP Short Term Goal 5 (Week 2): Pt will self-monitor and self-correct functional errors with min A verbal cues. SLP Short Term Goal 6 (Week 2): Pt will demonstrate use of safety precuations in functional tasks with supervision A verbal cues.  Skilled Therapeutic Interventions:  Skilled treatment session focused on cognition goals. SLP facilitated session by providing supervision cues for completion of semi-complex calendar task. Pt required redirection to task d/t internal comments about how he "would perform tasks at home." Pt able to navigate room well at wheelchair level and brush his teeth independently. Pt was left upright in wheelchair with lap alarm on and all needs within reach. Continue per current plan of care.      Pain Pain Assessment Pain Scale: 0-10 Pain Score: 0-No pain  Therapy/Group: Individual Therapy  Elmire Amrein 09/04/2018, 3:54 PM

## 2018-09-04 NOTE — Progress Notes (Addendum)
[facility] PHYSICAL MEDICINE & REHABILITATION PROGRESS NOTE   Subjective/Complaints: Up in bed. Hungry and waiting for breakfast! Didn't sleep well per RN but didn't mention anything to me.   ROS: Patient denies fever, rash, sore throat, blurred vision, nausea, vomiting, diarrhea, cough, shortness of breath or chest pain, joint or back pain, headache, or mood change.    Objective:   No results found. No results for input(s): WBC, HGB, HCT, PLT in the last 72 hours. Recent Labs    09/02/18 0536 09/04/18 0523  NA 138 138  K 4.7 5.0  CL 104 104  CO2 24 24  GLUCOSE 102* 103*  BUN 20 23  CREATININE 0.76 0.73  CALCIUM 8.5* 9.1    Intake/Output Summary (Last 24 hours) at 09/04/2018 0949 Last data filed at 09/04/2018 0824 Gross per 24 hour  Intake 720 ml  Output 1850 ml  Net -1130 ml     Physical Exam: Vital Signs Blood pressure (!) 147/77, pulse 74, temperature 98.1 F (36.7 C), temperature source Oral, resp. rate 18, height 6' (1.829 m), weight 97.2 kg, SpO2 100 %.  Constitutional: No distress . Vital signs reviewed. HEENT: EOMI, oral membranes moist Neck: supple Cardiovascular: RRR without murmur. No JVD    Respiratory: CTA Bilaterally without wheezes or rales. Normal effort    GI: BS +, non-tender, non-distended  Musculoskeletal:  Lower extremity edema tr BLE Neurological. Alert, improved insight and awareness. Motor: Right upper extremity: 4+/5 prox to distal.  Left upper extremity: 5/5 proximal distal Bilateral lower extremities 2/5 RLE, 3- to 3/5 LLE Skin: Skin is warm and dry.  Surgical site remains  clean and dry  Psychiatric: more alert and animated     Assessment/Plan: 1. Functional deficits secondary to cervical myelopathy which require 3+ hours per day of interdisciplinary therapy in a comprehensive inpatient rehab setting.  Physiatrist is providing close team supervision and 24 hour management of active medical problems listed below.  Physiatrist  and rehab team continue to assess barriers to discharge/monitor patient progress toward functional and medical goals  Care Tool:  Bathing    Body parts bathed by patient: Right arm, Left arm, Chest, Abdomen, Front perineal area, Right upper leg, Left upper leg, Face   Body parts bathed by helper: Buttocks, Left lower leg, Right lower leg     Bathing assist Assist Level: Moderate Assistance - Patient 50 - 74%     Upper Body Dressing/Undressing Upper body dressing   What is the patient wearing?: Pull over shirt    Upper body assist Assist Level: Moderate Assistance - Patient 50 - 74%    Lower Body Dressing/Undressing Lower body dressing      What is the patient wearing?: Pants     Lower body assist Assist for lower body dressing: Maximal Assistance - Patient 25 - 49%     Toileting Toileting    Toileting assist Assist for toileting: Total Assistance - Patient < 25%     Transfers Chair/bed transfer  Transfers assist  Chair/bed transfer activity did not occur: Safety/medical concerns  Chair/bed transfer assist level: Minimal Assistance - Patient > 75%     Locomotion Ambulation   Ambulation assist   Ambulation activity did not occur: Safety/medical concerns          Walk 10 feet activity   Assist  Walk 10 feet activity did not occur: Safety/medical concerns        Walk 50 feet activity   Assist Walk 50 feet with 2 turns activity  did not occur: Safety/medical concerns         Walk 150 feet activity   Assist Walk 150 feet activity did not occur: Safety/medical concerns         Walk 10 feet on uneven surface  activity   Assist Walk 10 feet on uneven surfaces activity did not occur: Safety/medical concerns         Wheelchair     Assist Will patient use wheelchair at discharge?: Yes Type of Wheelchair: Manual Wheelchair activity did not occur: Safety/medical concerns  Wheelchair assist level: Supervision/Verbal cueing Max  wheelchair distance: 150'    Wheelchair 50 feet with 2 turns activity    Assist    Wheelchair 50 feet with 2 turns activity did not occur: Safety/medical concerns   Assist Level: Supervision/Verbal cueing   Wheelchair 150 feet activity     Assist Wheelchair 150 feet activity did not occur: Safety/medical concerns   Assist Level: Supervision/Verbal cueing     Medical Problem List and Plan: 1.  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 Cervical collar at all times  -Continue CIR therapies including PT, OT   2.  DVT Prophylaxis/Anticoagulation: SCDs. Vascular study negative  -  lovenox 30mg  q12   3. Pain Management:  Oxycodone and Valium as needed.  -right ankle pain/swelling. 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 Mood:  Provide emotional support 5. Neuropsych: This patient is capable of making decisions on his own behalf.  -trazodone prn for sleep 6. Skin/Wound Care:  Routine skin checks 7. Fluids/Electrolytes/Nutrition, acute renal insufficiency:    Off ivf  -continue megace, appetite now much improved  -push po  -holding lisinopril and metformin,  -liberalized diet per RD recs 8. Diabetes mellitus. Glucophage 1500 mg daily, Actos 15 mg daily, Amaryl 8 mg daily.   -glucophage held. reduced  l   -labile/borderline 12/19  -increase amaryl to 8mg  daily  -watch cbg's with diet change to regular and better intake 9. Hypertension. Lisinopril 40 mg daily, clonidine 0.1 mg twice a day, Norvasc 10 mg daily.   -held lisinopril per above  -controlled 12/18  -no changes  10. Hyperlipidemia. Lipitor 11. Constipation. Laxative assistance 12. Leukocytosis:  Resolved but malodorous urine  -ucx + for 100k e coli  -sensitive to keflex---continue for 7 day course 13. Colonic ileus:   -belly improved   -stools now regular and loose--firming up. Continue to hold senokot-s  -advanced to regular diet  -continue OOB  -potassium 5 today 12/19,  d/c kdur     LOS: 16 days A FACE TO FACE EVALUATION WAS PERFORMED  Ranelle OysterZachary T Kyann Heydt 09/04/2018, 9:49 AM

## 2018-09-05 ENCOUNTER — Inpatient Hospital Stay (HOSPITAL_COMMUNITY): Payer: Managed Care, Other (non HMO) | Admitting: Physical Therapy

## 2018-09-05 ENCOUNTER — Inpatient Hospital Stay (HOSPITAL_COMMUNITY): Payer: Managed Care, Other (non HMO) | Admitting: Speech Pathology

## 2018-09-05 ENCOUNTER — Inpatient Hospital Stay (HOSPITAL_COMMUNITY): Payer: Managed Care, Other (non HMO)

## 2018-09-05 ENCOUNTER — Inpatient Hospital Stay (HOSPITAL_COMMUNITY): Payer: Managed Care, Other (non HMO) | Admitting: Occupational Therapy

## 2018-09-05 LAB — GLUCOSE, CAPILLARY
Glucose-Capillary: 111 mg/dL — ABNORMAL HIGH (ref 70–99)
Glucose-Capillary: 186 mg/dL — ABNORMAL HIGH (ref 70–99)
Glucose-Capillary: 203 mg/dL — ABNORMAL HIGH (ref 70–99)
Glucose-Capillary: 141 mg/dL — ABNORMAL HIGH (ref 70–99)

## 2018-09-05 NOTE — Progress Notes (Signed)
Speech Language Pathology Daily Session Note  Patient Details  Name: Brandon Collins MRN: [id number] Date of Birth: [birth year]  Today's Date: 09/05/2018 SLP Individual Time: [id number] SLP Individual Time Calculation (min): 41 min  Short Term Goals: Week 2: SLP Short Term Goal 1 (Week 2): Pt will consume regular and thin liquid diet with min overt s/s aspiration and mod I use of swallow strategies. SLP Short Term Goal 2 (Week 2): Pt will demonstrate orientation to place, situation and time with min A verbal cues for use of external aid. SLP Short Term Goal 3 (Week 2): Pt will utilize external memory aids to recall new, mildly complex information with min A verbal cues. SLP Short Term Goal 4 (Week 2): Pt will complete semi-complex problem solving tasks with supervision A verbal cues during functional tasks. SLP Short Term Goal 5 (Week 2): Pt will self-monitor and self-correct functional errors with min A verbal cues. SLP Short Term Goal 6 (Week 2): Pt will demonstrate use of safety precuations in functional tasks with supervision A verbal cues.  Skilled Therapeutic Interventions:  Pt was seen for skilled ST targeting goals for dysphagia and cognition.  Pt consumed a functional snack of crackers and peanut butter with intermittent throat clearing following mixed solid and liquid consistencies but no other overt s/s of aspiration.  Recommend that pt remain on his currently prescribed diet.  SLP also facilitated the session with medication management tasks to address recall goals.  Pt was able to name function of medication when named in ~50% of opportunities, which improved to ~75% with min assist verbal cues.  Pt was returned to room and left in wheelchair with chair alarm set and call bell within reach.  Continue per current plan of care.    Pain Pain Assessment Pain Scale: 0-10 Pain Score: 0-No pain  Therapy/Group: Individual Therapy  Rolland Steinert, Melanee SpryNicole L 09/05/2018, 10:19 AM

## 2018-09-05 NOTE — Progress Notes (Signed)
Occupational Therapy Weekly Progress Note  Patient Details  Name: Brandon Collins MRN: [id number] Date of Birth: May 15, 1951  Beginning of progress report period: 08/28/18 End of progress report period: 09/05/18  Today's Date: 09/05/2018 OT Individual Time: 1330-1408 OT Individual Time Calculation (min): 38 min  22 Minutes missed   Patient has met 4 of 4 short term goals.    Pt continues to make functional progress at time of report. At time of eval, he required 2 assist for self care tasks bedlevel and EOB. He presently requires Min A for bathing using lateral lean technique, Mod A for LB dressing, and supervision for donning shirt. Pt now completes BSC transfers via slideboard or squat pivot method with Min A, improving from time of eval where he required 2 assist. Pt is better able to follow instruction, sequence functional tasks, and recall events from previous therapy sessions. He is on track for LTG achievement set at [name] overall. Continue POC.   Patient continues to demonstrate the following deficits: muscle weakness, decreased cardiorespiratoy endurance, abnormal tone, unbalanced muscle activation, decreased coordination and decreased motor planning, decreased initiation, decreased attention, decreased awareness, decreased problem solving, decreased safety awareness and decreased memory and decreased sitting balance, decreased standing balance, decreased postural control and decreased balance strategies and therefore will continue to benefit from skilled OT intervention to enhance overall performance with BADL.  Patient progressing toward long term goals..  Continue plan of care.  OT Short Term Goals Week 2:  OT Short Term Goal 1 (Week 2): Pt will maintain sitting balance for 5 minutes at mod A or better.  OT Short Term Goal 1 - Progress (Week 2): Met OT Short Term Goal 2 (Week 2): Pt will engaged in functional task for 10 minutes before needing rest break secondary to fatigue.  OT  Short Term Goal 2 - Progress (Week 2): Met OT Short Term Goal 3 (Week 2): Pt will perform UB dressing with min A sitting on EOB.  OT Short Term Goal 3 - Progress (Week 2): Met OT Short Term Goal 4 (Week 2): Pt will be able to utilize STEDY with max A of 2 for shower or toilet transfer.  OT Short Term Goal 4 - Progress (Week 2): Met  Week 3: STGs=LTGs set at Min A overall   Skilled Therapeutic Interventions/Progress Updates:     22 minutes missed initially due to pt fatigue and wanting to rest. When OT returned, pt reported feeling too tired to get OOB. Also stated Rt knee was very painful. Provided pt with ice pack for pain mgt. During session, reviewed therapy schedule with pt. He recalled therapy events from this AM with mod vcs. Able to provide a few details, which were confirmed via therapist documentation. Oriented to month and place which is an improvement from last week during OT. Issued pt memory book and recorded today's therapeutic events. Pt writing in it with supervision. He then reported urgent need to void bladder. Pt transitioned EOB with steady assist and managed clothing. With Min A for urinal placement, pt with small void of bladder. He then returned to supine position. Pt left with all needs and bed alarm set. He reported Rt knee pain had absolved with use of ice.     Therapy Documentation Precautions:  Precautions Precautions: Cervical, Fall Precaution Comments: Reviewed cervical precautions Required Braces or Orthoses: Cervical Brace Cervical Brace: Hard collar, At all times Restrictions Weight Bearing Restrictions: No Pain: Stated and addressed as written above  ADL: ADL Eating: Supervision/safety Where Assessed-Eating: Bed level Grooming: Supervision/safety Where Assessed-Grooming: Wheelchair Upper Body Bathing: Supervision/safety Where Assessed-Upper Body Bathing: Other (Comment)(recliner) Lower Body Bathing: Other (comment), Minimal assistance Where  Assessed-Lower Body Bathing: Other (Comment)(recliner) Upper Body Dressing: Supervision/safety Where Assessed-Upper Body Dressing: Other (Comment)(recliner) Lower Body Dressing: Moderate cueing Where Assessed-Lower Body Dressing: Other (Comment)(recliner) Toileting: Maximal assistance Where Assessed-Toileting: Bedside Commode Toilet Transfer: Minimal assistance Toilet Transfer Method: Squat pivot Toilet Transfer Equipment: Geophysical data processor: Not assessed     Therapy/Group: Individual Therapy  Shamell Hittle A Joangel Vanosdol 09/05/2018, 4:18 PM

## 2018-09-05 NOTE — Progress Notes (Signed)
[facility]IC[facility]ILITATION PROGRESS NOTE   Subjective/Complaints:  No abd pain, having BMs regularly, poor appetite  ROS: Patient denies, nausea, vomiting, diarrhea, cough, shortness of breath or chest pain,    Objective:   No results found. No results for input(s): WBC, HGB, HCT, PLT in the last 72 hours. Recent Labs    09/04/18 0523  NA 138  K 5.0  CL 104  CO2 24  GLUCOSE 103*  BUN 23  CREATININE 0.73  CALCIUM 9.1    Intake/Output Summary (Last 24 hours) at 09/05/2018 0716 Last data filed at 09/05/2018 0400 Gross per 24 hour  Intake 720 ml  Output 1125 ml  Net -405 ml     Physical Exam: Vital Signs Blood pressure (!) 144/78, pulse 73, temperature 97.8 F (36.6 C), temperature source Oral, resp. rate 18, height 6' (1.829 m), weight 92.4 kg, SpO2 100 %.  Constitutional: No distress . Vital signs reviewed. HEENT: EOMI, oral membranes moist Neck: supple Cardiovascular: RRR without murmur. No JVD    Respiratory: CTA Bilaterally without wheezes or rales. Normal effort    GI: BS +, non-tender, non-distended  Musculoskeletal:  Lower extremity edema tr BLE Neurological. Alert, improved insight and awareness. Motor: Right upper extremity: 4+/5 prox to distal.  Left upper extremity: 5/5 proximal distal Bilateral lower extremities 2/5 RLE, 3- to 3/5 LLE Skin: Skin is warm and dry.  Surgical site remains  clean and dry  Psychiatric: more alert and animated     Assessment/Plan: 1. Functional deficits secondary to cervical myelopathy which require 3+ hours per day of interdisciplinary therapy in a comprehensive inpatient rehab setting.  Physiatrist is providing close team supervision and 24 hour management of active medical problems listed below.  Physiatrist and rehab team continue to assess barriers to discharge/monitor patient progress toward functional and medical goals  Care Tool:  Bathing    Body parts bathed by patient: Right arm, Left  arm, Chest, Abdomen, Front perineal area, Right upper leg, Left upper leg, Face   Body parts bathed by helper: Buttocks, Left lower leg, Right lower leg     Bathing assist Assist Level: Moderate Assistance - Patient 50 - 74%     Upper Body Dressing/Undressing Upper body dressing   What is the patient wearing?: Pull over shirt    Upper body assist Assist Level: Moderate Assistance - Patient 50 - 74%    Lower Body Dressing/Undressing Lower body dressing      What is the patient wearing?: Pants     Lower body assist Assist for lower body dressing: Maximal Assistance - Patient 25 - 49%     Toileting Toileting    Toileting assist Assist for toileting: Total Assistance - Patient < 25%     Transfers Chair/bed transfer  Transfers assist  Chair/bed transfer activity did not occur: Safety/medical concerns  Chair/bed transfer assist level: Minimal Assistance - Patient > 75%     Locomotion Ambulation   Ambulation assist   Ambulation activity did not occur: Safety/medical concerns          Walk 10 feet activity   Assist  Walk 10 feet activity did not occur: Safety/medical concerns        Walk 50 feet activity   Assist Walk 50 feet with 2 turns activity did not occur: Safety/medical concerns         Walk 150 feet activity   Assist Walk 150 feet activity did not occur: Safety/medical concerns  Walk 10 feet on uneven surface  activity   Assist Walk 10 feet on uneven surfaces activity did not occur: Safety/medical concerns         Wheelchair     Assist Will patient use wheelchair at discharge?: Yes Type of Wheelchair: Manual Wheelchair activity did not occur: Safety/medical concerns  Wheelchair assist level: Supervision/Verbal cueing Max wheelchair distance: 150'    Wheelchair 50 feet with 2 turns activity    Assist    Wheelchair 50 feet with 2 turns activity did not occur: Safety/medical concerns   Assist Level:  Supervision/Verbal cueing   Wheelchair 150 feet activity     Assist Wheelchair 150 feet activity did not occur: Safety/medical concerns   Assist Level: Supervision/Verbal cueing     Medical Problem List and Plan: 1.  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 Cervical collar at all times  -Continue CIR therapies including PT, OT   2.  DVT Prophylaxis/Anticoagulation: SCDs. Vascular study negative  -  lovenox 30mg  q12   3. Pain Management:  Oxycodone and Valium as needed.  -right ankle pain/swelling. 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 Mood:  Provide emotional support 5. Neuropsych: This patient is capable of making decisions on his own behalf.  -trazodone prn for sleep 6. Skin/Wound Care:  Routine skin checks 7. Fluids/Electrolytes/Nutrition, acute renal insufficiency:    Off ivf  -12/19 BMET nl  -push po  -holding lisinopril and metformin,  -liberalized diet per RD recs 8. Diabetes mellitus. Glucophage 1500 mg daily, Actos 15 mg daily, Amaryl 8 mg daily.   -glucophage held. reduced  l    CBG (last 3)  Recent Labs    09/04/18 1627 09/04/18 2159 09/05/18 0652  GLUCAP 123* 171* 111*    -increase amaryl to 8mg  daily  -in range 9. Hypertension. Lisinopril 40 mg daily, clonidine 0.1 mg twice a day, Norvasc 10 mg daily.   -held lisinopril per above  -controlled 12/18  -no changes  10. Hyperlipidemia. Lipitor 11. Constipation. Laxative assistance 12. Leukocytosis:  Resolved but malodorous urine  -ucx + for 100k e coli  -sensitive to keflex---continue for 7 day course 13. Colonic ileus: resolved  -belly soft , non distended   -stools now regular and loose--firming up. Continue to hold senokot-s  -advanced to regular diet  -continue OOB  -potassium 5 today 12/19, d/c kdur     LOS: 17 days A FACE TO FACE EVALUATION WAS PERFORMED  Erick Colacendrew E Sarvesh Meddaugh 09/05/2018, 7:16 AM

## 2018-09-05 NOTE — Progress Notes (Signed)
Physical Therapy Session Note  Patient Details  Name: Brandon Collins MRN: [id number] Date of Birth: 1951/07/18  Today's Date: 09/05/2018 PT Individual Time: 1100-1200 PT Individual Time Calculation (min): 60 min   Short Term Goals: Week 3:  PT Short Term Goal 1 (Week 3): Pt will complete bed mobility with min A consistently PT Short Term Goal 2 (Week 3): Pt will complete least restrictive transfer with Supervision consistently PT Short Term Goal 3 (Week 3): Pt will perform w/c mobility and parts management with Supervision consistently  Skilled Therapeutic Interventions/Progress Updates:    Pt received seated in w/c in room asleep, able to be aroused and agreeable to participate in therapy session. No complaints of pain. Manual w/c propulsion x 150 ft with BUE and Supervision. Visual and verbal cues for sequencing for setup of squat pivot transfer w/c to mat table with min A for transfer. Sit to stand from elevated mat table to stedy with mod to max A x 2. Pt exhibits improved tolerance for standing and is able to engage glute muscles for improved hip extension and upright posture this date. Use of mirror for visual feedback for upright posture. Pt fatigues quickly in standing and has onset of low back pain in standing that resolves once seated. Pt is able to stand x 5 reps before fatigue and unable to perform any further stands. Sit to/from supine with min A with tactile cueing for placement. Attempt supine B HS stretch, pt unable to tolerate this date due to onset of B knee pain. Pt declines any intervention for pain. Attempt to have pt perform bridges in supine, pt is able to complete x 1 repetition before muscle fatigue. Pt left seated in w/c in room with needs in reach, chair alarm and quick release belt in place.  Therapy Documentation Precautions:  Precautions Precautions: Cervical, Fall Precaution Comments: Reviewed cervical precautions Required Braces or Orthoses: Cervical  Brace Cervical Brace: Hard collar, At all times Restrictions Weight Bearing Restrictions: No Pain: Pain Assessment Pain Scale: 0-10 Pain Score: 0-No pain    Therapy/Group: Individual Therapy  Peter Congoaylor Rithwik Schmieg, PT, DPT  09/05/2018, 12:24 PM

## 2018-09-05 NOTE — Plan of Care (Signed)
  Problem: RH SAFETY Goal: RH STG ADHERE TO SAFETY PRECAUTIONS W/ASSISTANCE/DEVICE Description STG Adhere to Safety Precautions With Assistance/Device. Outcome: Progressing  Call light within reach, bed/chair alarm, proper footwear, pt does better whenever in chair (he notes bed is uncomfortable for him)

## 2018-09-05 NOTE — Progress Notes (Signed)
Occupational Therapy Session Note  Patient Details  Name: SHONN FARRUGGIA MRN: [id number] Date of Birth: 06-24-1951  Today's Date: 09/05/2018 OT Individual Time: 9518-8416 OT Individual Time Calculation (min): 72 min    Short Term Goals: Week 1:  OT Short Term Goal 1 (Week 1): Pt will roll R and L with max +1 to reduce caregiver burden with LB dressing OT Short Term Goal 1 - Progress (Week 1): Met OT Short Term Goal 2 (Week 1): Pt will don shirt with (S) sitting EOB OT Short Term Goal 2 - Progress (Week 1): Not met OT Short Term Goal 3 (Week 1): Pt will feed himself entire meal with LRD with CGA OT Short Term Goal 3 - Progress (Week 1): Progressing toward goal OT Short Term Goal 4 (Week 1): Pt will use STEDY with max +2 to transfer to w/c  OT Short Term Goal 4 - Progress (Week 1): Not met  Skilled Therapeutic Interventions/Progress Updates:    1;1. Pt greeted in recliner. Pt completes bathing and dressing at seated level with lateral leans to manage clothing and wash buttocks. Pt able to wash all body parts with A to wash buttocks thoroughly and RLE while sitting EOC with intermittant CGA for sitting balance suring lateral leans. Pt able to don shirt with S at [facility]. Pt dons pants with MIN A for A to advance pants past hips 25% while lateral leaning. Pt dons socks with sock aide and VC for technique after OT dons teds. Pt brushes teeth with set up. Pt eats with set up. Pt completes stedy transfer with MAX A of 2 to [facility] with VC for anterior weight shift. Pt completes palm<>finger translation of scrabble tiles with dropping 50% of time with palm>finger and forced decrease of compensatory strategies to improve FMC of BUE. Exited session with pt seated in w/c, call light in reach and belt alarm on  Therapy Documentation Precautions:  Precautions Precautions: Cervical, Fall Precaution Comments: Reviewed cervical precautions Required Braces or Orthoses: Cervical Brace Cervical Brace: Hard collar,  At all times Restrictions Weight Bearing Restrictions: No General:   Vital Signs: Therapy Vitals Temp: 97.8 F (36.6 C) Temp Source: Oral Pulse Rate: 73 Resp: 18 BP: (!) 144/78 Patient Position (if appropriate): Lying Oxygen Therapy SpO2: 100 % O2 Device: Room Air Pain:   ADL:   Vision   Perception    Praxis   Exercises:   Other Treatments:     Therapy/Group: Individual Therapy  Tonny Branch 09/05/2018, 8:22 AM

## 2018-09-06 ENCOUNTER — Inpatient Hospital Stay (HOSPITAL_COMMUNITY): Payer: Managed Care, Other (non HMO) | Admitting: Occupational Therapy

## 2018-09-06 ENCOUNTER — Other Ambulatory Visit: Payer: Self-pay | Admitting: Internal Medicine

## 2018-09-06 LAB — GLUCOSE, CAPILLARY
Glucose-Capillary: 80 mg/dL (ref 70–99)
Glucose-Capillary: 163 mg/dL — ABNORMAL HIGH (ref 70–99)
Glucose-Capillary: 169 mg/dL — ABNORMAL HIGH (ref 70–99)
Glucose-Capillary: 221 mg/dL — ABNORMAL HIGH (ref 70–99)

## 2018-09-06 MED ORDER — GLIMEPIRIDE 4 MG PO TABS
4.0000 mg | ORAL_TABLET | Freq: Every day | ORAL | Status: DC
  Administered 2018-09-07 – 2018-09-18 (×12): 4 mg via ORAL
  Filled 2018-09-06 (×13): qty 1

## 2018-09-06 NOTE — Progress Notes (Signed)
Brandon Collins is a 67 y.o. male who is admitted for CIR with quadriparesis secondary to cervical myelopathy.  He is status post ACDF C3-C7  Past Medical History:  Diagnosis Date  . Arthritis    "all over" (08/12/2018)  . High cholesterol   . Hypertension   . Type II diabetes mellitus (HCC)      Subjective: No new complaints. No new problems.  Still having significant neck and back discomfort  Objective: Vital signs in last 24 hours: Temp:  [97.7 F (36.5 C)-98 F (36.7 C)] 97.9 F (36.6 C) (12/21 0437) Pulse Rate:  [82-96] 93 (12/21 0812) Resp:  [17-20] 20 (12/21 0812) BP: (127-166)/(70-81) 134/81 (12/21 0812) SpO2:  [98 %-100 %] 99 % (12/21 0812) Weight:  [92.5 kg] 92.5 kg (12/21 0437) Weight change: 0.1 kg Last BM Date: 09/03/18  Intake/Output from previous day: 12/20 0701 - 12/21 0700 In: 560 [P.O.:560] Out: 1800 [Urine:1800] Last cbgs: CBG (last 3)  Recent Labs    09/05/18 1626 09/05/18 2123 09/06/18 0638  GLUCAP 203* 141* 80   No results found for: HGBA1C   Physical Exam General: No apparent distress   HEENT: not dry cervical collar in place Lungs: Normal effort. Lungs clear to auscultation, no crackles or wheezes. Cardiovascular: Regular rate and rhythm, no edema Abdomen: S/NT/ND; BS(+) Musculoskeletal:  unchanged Neurological: No new neurological deficits with lower extremity weakness Extremities.  No edema Skin: clear;  right heel pad in place Mental state: Alert, oriented, cooperative    Lab Results: BMET    Component Value Date/Time   NA 138 09/04/2018 0523   K 5.0 09/04/2018 0523   CL 104 09/04/2018 0523   CO2 24 09/04/2018 0523   GLUCOSE 103 (H) 09/04/2018 0523   BUN 23 09/04/2018 0523   CREATININE 0.73 09/04/2018 0523   CALCIUM 9.1 09/04/2018 0523   GFRNONAA >60 09/04/2018 0523   GFRAA >60 09/04/2018 0523   CBC    Component Value Date/Time   WBC 13.1 (H) 08/27/2018 0435   RBC 3.94 (L) 08/27/2018 0435   HGB 11.3 (L) 08/27/2018  0435   HCT 36.1 (L) 08/27/2018 0435   PLT 350 08/27/2018 0435   MCV 91.6 08/27/2018 0435   MCH 28.7 08/27/2018 0435   MCHC 31.3 08/27/2018 0435   RDW 12.6 08/27/2018 0435   LYMPHSABS 1.6 08/20/2018 0607   MONOABS 2.3 (H) 08/20/2018 0607   EOSABS 0.1 08/20/2018 0607   BASOSABS 0.1 08/20/2018 0607    Medications: I have reviewed the patient's current medications.  Assessment/Plan:  Functional deficits with quadriparesis secondary to cervical myelopathy DVT prophylaxis.  Continue SCDs Pain management.  Continue oxycodone as needed  Diabetes mellitus.  Continue oral regimen.  Fasting blood sugar today 80.  We will down titrate Amaryl  Length of stay, days: 18  Gordy SaversPeter F Kwiatkowski , MD 09/06/2018, 9:56 AM

## 2018-09-06 NOTE — Plan of Care (Signed)
  Problem: RH SAFETY Goal: RH STG ADHERE TO SAFETY PRECAUTIONS W/ASSISTANCE/DEVICE Description STG Adhere to Safety Precautions With Assistance/Device. Outcome: Progressing  Telesitter, call light within reach, bed/chair alarm, proper footwear  Problem: RH PAIN MANAGEMENT Goal: RH STG PAIN MANAGED AT OR BELOW PT'S PAIN GOAL Outcome: Progressing  Assess pain, administer pain regimen and reposition

## 2018-09-06 NOTE — Progress Notes (Signed)
Occupational Therapy Session Note  Patient Details  Name: Brandon Collins MRN: [id number] Date of Birth: 1951-04-23  Today's Date: 09/06/2018 OT Individual Time: 1524-1550 OT Individual Time Calculation (min): 26 min   Short Term Goals: Week 3:  OT Short Term Goal 1 (Week 3): STGs=LTGs set at [employer] overall   Skilled Therapeutic Interventions/Progress Updates:    Pt greeted in w/c, requesting to return to bed due to fatigue and increased R LE pain. Attempted squat pivot<bed however pt reported being unable to assist due to pain. Pt still unable to assist when slideboard was retrieved, and therefore he required 2 assist for slideboard<bed. Pt was repositioned for comfort in bed with ice applied to Rt knee. Pt left in care of RN at end of session to receive pain medicine.   Therapy Documentation Precautions:  Precautions Precautions: Cervical, Fall Precaution Comments: Reviewed cervical precautions Required Braces or Orthoses: Cervical Brace Cervical Brace: Hard collar, At all times Restrictions Weight Bearing Restrictions: No Pain: Described and addressed as written above   ADL: ADL Eating: Supervision/safety Where Assessed-Eating: Bed level Grooming: Supervision/safety Where Assessed-Grooming: Wheelchair Upper Body Bathing: Supervision/safety Where Assessed-Upper Body Bathing: Other (Comment)(recliner) Lower Body Bathing: Other (comment), Minimal assistance Where Assessed-Lower Body Bathing: Other (Comment)(recliner) Upper Body Dressing: Supervision/safety Where Assessed-Upper Body Dressing: Other (Comment)(recliner) Lower Body Dressing: Moderate cueing Where Assessed-Lower Body Dressing: Other (Comment)(recliner) Toileting: Maximal assistance Where Assessed-Toileting: Bedside Commode Toilet Transfer: Minimal assistance Toilet Transfer Method: Squat pivot Toilet Transfer Equipment: Psychiatric nurseBedside commode Walk-In Shower Transfer: Not assessed      Therapy/Group: Individual  Therapy  Murlean Seelye A Adrienne Trombetta 09/06/2018, 4:00 PM

## 2018-09-07 ENCOUNTER — Inpatient Hospital Stay (HOSPITAL_COMMUNITY): Payer: Managed Care, Other (non HMO)

## 2018-09-07 LAB — GLUCOSE, CAPILLARY
Glucose-Capillary: 76 mg/dL (ref 70–99)
Glucose-Capillary: 116 mg/dL — ABNORMAL HIGH (ref 70–99)
Glucose-Capillary: 118 mg/dL — ABNORMAL HIGH (ref 70–99)
Glucose-Capillary: 113 mg/dL — ABNORMAL HIGH (ref 70–99)

## 2018-09-07 NOTE — Progress Notes (Signed)
Physical Therapy Session Note  Patient Details  Name: Brandon Collins MRN: [id number] Date of Birth: 03-Aug-1951  Today's Date: 09/07/2018 PT Individual Time: 1105-1200 PT Individual Time Calculation (min): 55 min   Short Term Goals: Week 3:  PT Short Term Goal 1 (Week 3): Pt will complete bed mobility with min A consistently PT Short Term Goal 2 (Week 3): Pt will complete least restrictive transfer with Supervision consistently PT Short Term Goal 3 (Week 3): Pt will perform w/c mobility and parts management with Supervision consistently  Skilled Therapeutic Interventions/Progress Updates:    Pt supine in bed upon PT arrival, agreeable to therapy tx and reports pain in R LE 8/10. Pt performed rolling in both directions with mod assist while therapist assisted to don pants. Pt transferred to sitting EOB with mod assist and performed slideboard transfer to w/c with mod assist. Pt propelled w/c to the gym x 150 ft with supervision using B UEs. Pt performed slideboard transfer to mat with mod assist. While seated edge of mat this session pt worked on UE strengthening exercises with 3# dowel including shoulder press 2 x 10, bicep curls 2 x 10 and shoulder flexion 2 x 10. Pt performed L LE LAQ and hip flexion while seated edge of mat, 2 x 10 each. Pt unable to tolerate any R LE exercises or stretches this session secondary to pain, grimacing with all R LE movements/touch. Pt transferred to w/c with slideboard and min assist, transported back to room and left seated in w/c with needs in reach and chair alarm set.   Therapy Documentation Precautions:  Precautions Precautions: Cervical, Fall Precaution Comments: Reviewed cervical precautions Required Braces or Orthoses: Cervical Brace Cervical Brace: Hard collar, At all times Restrictions Weight Bearing Restrictions: No    Therapy/Group: Individual Therapy  Cresenciano GenreEmily van Schagen, PT, DPT 09/07/2018, 11:35 AM

## 2018-09-07 NOTE — Progress Notes (Signed)
Brandon Collins is a 67 y.o. male who is admitted for CIR due to cervical myelopathy with quadriparesis.  He is status post ACDF C3-C7  Past Medical History:  Diagnosis Date  . Arthritis    "all over" (08/12/2018)  . High cholesterol   . Hypertension   . Type II diabetes mellitus (HCC)    '  Subjective: No new complaints. No new problems.  Neck and back pain improved this a.m.  Objective: Vital signs in last 24 hours: Temp:  [98 F (36.7 C)-98.4 F (36.9 C)] 98 F (36.7 C) (12/22 0351) Pulse Rate:  [73-91] 73 (12/22 0351) Resp:  [18] 18 (12/22 0351) BP: (131-152)/(64-77) 138/77 (12/22 0351) SpO2:  [98 %-100 %] 100 % (12/22 0351) Weight:  [93.2 kg] 93.2 kg (12/22 0351) Weight change: 0.714 kg Last BM Date: 09/06/18  Intake/Output from previous day: 12/21 0701 - 12/22 0700 In: 760 [P.O.:760] Out: 1175 [Urine:1175] Last cbgs: CBG (last 3)  Recent Labs    09/06/18 1641 09/06/18 2104 09/07/18 0638  GLUCAP 169* 221* 76     Physical Exam General: No apparent distress   HEENT: Cervical collar in place Lungs: Normal effort. Lungs clear to auscultation, no crackles or wheezes. Cardiovascular: Regular rate and rhythm, no edema Abdomen: S/NT/ND; BS(+) Musculoskeletal:  unchanged Neurological: No new neurological deficits with stable lower extremity weakness Extremities no edema Skin: clear;  right heel bandage in place Mental state: Alert, oriented, cooperative    Lab Results: BMET    Component Value Date/Time   NA 138 09/04/2018 0523   K 5.0 09/04/2018 0523   CL 104 09/04/2018 0523   CO2 24 09/04/2018 0523   GLUCOSE 103 (H) 09/04/2018 0523   BUN 23 09/04/2018 0523   CREATININE 0.73 09/04/2018 0523   CALCIUM 9.1 09/04/2018 0523   GFRNONAA >60 09/04/2018 0523   GFRAA >60 09/04/2018 0523   CBC    Component Value Date/Time   WBC 13.1 (H) 08/27/2018 0435   RBC 3.94 (L) 08/27/2018 0435   HGB 11.3 (L) 08/27/2018 0435   HCT 36.1 (L) 08/27/2018 0435   PLT 350  08/27/2018 0435   MCV 91.6 08/27/2018 0435   MCH 28.7 08/27/2018 0435   MCHC 31.3 08/27/2018 0435   RDW 12.6 08/27/2018 0435   LYMPHSABS 1.6 08/20/2018 0607   MONOABS 2.3 (H) 08/20/2018 0607   EOSABS 0.1 08/20/2018 0607   BASOSABS 0.1 08/20/2018 0607    Medications: I have reviewed the patient's current medications.  Assessment/Plan:  Functional deficits secondary to cervical myelopathy with quadriparesis Diabetes mellitus.  Remains well controlled.  Fasting blood sugar 76.  Glimepiride down titrated yesterday DVT prophylaxis continue SCDs Pain management.  Continue as needed oxycodone    Length of stay, days: 19  Brandon Collins , MD 09/07/2018, 9:41 AM

## 2018-09-08 ENCOUNTER — Inpatient Hospital Stay (HOSPITAL_COMMUNITY): Payer: Managed Care, Other (non HMO) | Admitting: Occupational Therapy

## 2018-09-08 ENCOUNTER — Inpatient Hospital Stay (HOSPITAL_COMMUNITY): Payer: Managed Care, Other (non HMO) | Admitting: Speech Pathology

## 2018-09-08 ENCOUNTER — Inpatient Hospital Stay (HOSPITAL_COMMUNITY): Payer: Managed Care, Other (non HMO) | Admitting: Physical Therapy

## 2018-09-08 LAB — GLUCOSE, CAPILLARY
Glucose-Capillary: 79 mg/dL (ref 70–99)
Glucose-Capillary: 88 mg/dL (ref 70–99)
Glucose-Capillary: 112 mg/dL — ABNORMAL HIGH (ref 70–99)
Glucose-Capillary: 124 mg/dL — ABNORMAL HIGH (ref 70–99)

## 2018-09-08 NOTE — Progress Notes (Signed)
Occupational Therapy Session Note  Patient Details  Name: Brandon Collins MRN: [id number] Date of Birth: 07-06-1951  Today's Date: 09/08/2018 OT Individual Time: 1005-1030 OT Individual Time Calculation (min): 25 min    Short Term Goals: Week 1:  OT Short Term Goal 1 (Week 1): Pt will roll R and L with max +1 to reduce caregiver burden with LB dressing OT Short Term Goal 1 - Progress (Week 1): Met OT Short Term Goal 2 (Week 1): Pt will don shirt with (S) sitting EOB OT Short Term Goal 2 - Progress (Week 1): Not met OT Short Term Goal 3 (Week 1): Pt will feed himself entire meal with LRD with CGA OT Short Term Goal 3 - Progress (Week 1): Progressing toward goal OT Short Term Goal 4 (Week 1): Pt will use STEDY with max +2 to transfer to w/c  OT Short Term Goal 4 - Progress (Week 1): Not met Week 2:  OT Short Term Goal 1 (Week 2): Pt will maintain sitting balance for 5 minutes at mod A or better.  OT Short Term Goal 1 - Progress (Week 2): Met OT Short Term Goal 2 (Week 2): Pt will engaged in functional task for 10 minutes before needing rest break secondary to fatigue.  OT Short Term Goal 2 - Progress (Week 2): Met OT Short Term Goal 3 (Week 2): Pt will perform UB dressing with min A sitting on EOB.  OT Short Term Goal 3 - Progress (Week 2): Met OT Short Term Goal 4 (Week 2): Pt will be able to utilize STEDY with max A of 2 for shower or toilet transfer.  OT Short Term Goal 4 - Progress (Week 2): Met Week 3:  OT Short Term Goal 1 (Week 3): STGs=LTGs set at Min A overall   Skilled Therapeutic Interventions/Progress Updates:    Pt received in w/c with spouse in the room. Pt stated his hands were feeling very painful but agreeable to trying some exercises.  Pt taken to gym with his wife.  He worked on gentle AROM exercises and holding a soft medium size therapy ball. Worked on rolling ball forward and back, squeezing it to activate muscles, reaching ball up to shoulder height.  Discussed  bathroom set up and demonstrated to pt and spouse how to use a tub bench in ADL apt as that is the same set up as their home.  Pt will need to practice this with therapy and HHOT before trying this on their own at home.  Pt and his wife went to day room to wait for their next therapy.   Therapy Documentation Precautions:  Precautions Precautions: Cervical, Fall Precaution Comments: Reviewed cervical precautions Required Braces or Orthoses: Cervical Brace Cervical Brace: Hard collar, At all times Restrictions Weight Bearing Restrictions: No      Pain:   ADL: ADL Eating: Supervision/safety Where Assessed-Eating: Bed level Grooming: Supervision/safety Where Assessed-Grooming: Wheelchair Upper Body Bathing: Supervision/safety Where Assessed-Upper Body Bathing: Other (Comment)(recliner) Lower Body Bathing: Other (comment), Minimal assistance Where Assessed-Lower Body Bathing: Other (Comment)(recliner) Upper Body Dressing: Supervision/safety Where Assessed-Upper Body Dressing: Other (Comment)(recliner) Lower Body Dressing: Moderate cueing Where Assessed-Lower Body Dressing: Other (Comment)(recliner) Toileting: Maximal assistance Where Assessed-Toileting: Bedside Commode Toilet Transfer: Minimal assistance Toilet Transfer Method: Squat pivot Toilet Transfer Equipment: Geophysical data processor: Not assessed   Therapy/Group: Individual Therapy  Marco Adelson 09/08/2018, 11:40 AM

## 2018-09-08 NOTE — Progress Notes (Signed)
Occupational Therapy Session Note  Patient Details  Name: Brandon Collins MRN: [id number] Date of Birth: 25-Apr-1951  Today's Date: 09/08/2018 OT Individual Time: [id number] OT Individual Time Calculation (min): 70 min    Short Term Goals: Week 3:  OT Short Term Goal 1 (Week 3): STGs=LTGs set at [employer] A overall   Skilled Therapeutic Interventions/Progress Updates:    Pt greeted in w/c with spouse present. Spouse [name] discussed bathroom layout for home in previous OT session. Pt escorted to therapy apartment and attempted squat pivot<TTB (per home setup), and he was unable to complete due to Rt knee pain. Attempted shower transfer again in room shower with grab bar support, with pt still reporting that he couldn't tolerate moving because of R LE. Therefore had pt complete sponge bathing at sink with spouse providing hands on assist for family education. Educated spouse to sponge bathe at home vs shower until home health therapy deems this safe. She said she can set him up at the kitchen table with a basin and he can wash from the w/c. Spouse had a lot of difficultly assisting pt while donning Teds for R LE due to pt crying out in pain. Therefore this was completed by OT. She had hands on practice doffing/donning c-collar in seated position (per MD order) and also donning/doffing shower collar. She will benefit from additional practice with donning/doff c-collar and also for assisting with bedlevel selfcare. At end of session pt was left with safety belt fastened and visitors present.   Therapy Documentation Precautions:  Precautions Precautions: Cervical, Fall Precaution Comments: Reviewed cervical precautions Required Braces or Orthoses: Cervical Brace Cervical Brace: Hard collar, At all times Restrictions Weight Bearing Restrictions: No Pain: In R UE/LE. RN made aware. Per spouse, pt premedicated at start of session  Pain Assessment Pain Score: 3  ADL: ADL Eating: Supervision/safety Where  Assessed-Eating: Bed level Grooming: Supervision/safety Where Assessed-Grooming: Wheelchair Upper Body Bathing: Supervision/safety Where Assessed-Upper Body Bathing: Other (Comment)(recliner) Lower Body Bathing: Other (comment), Minimal assistance Where Assessed-Lower Body Bathing: Other (Comment)(recliner) Upper Body Dressing: Supervision/safety Where Assessed-Upper Body Dressing: Other (Comment)(recliner) Lower Body Dressing: Moderate cueing Where Assessed-Lower Body Dressing: Other (Comment)(recliner) Toileting: Maximal assistance Where Assessed-Toileting: Bedside Commode Toilet Transfer: Minimal assistance Toilet Transfer Method: Squat pivot Toilet Transfer Equipment: Psychiatric nurseBedside commode Walk-In Shower Transfer: Not assessed      Therapy/Group: Individual Therapy  [name] [name] 09/08/2018, 12:44 PM

## 2018-09-08 NOTE — Progress Notes (Addendum)
Physical Therapy Session Note  Patient Details  Name: Brandon Collins MRN: [id number] Date of Birth: 1950/10/22  Today's Date: 09/08/2018 PT Individual Time: 0800-0900; 1500-1530 PT Individual Time Calculation (min): 60 min and 30 min  Short Term Goals: Week 3:  PT Short Term Goal 1 (Week 3): Pt will complete bed mobility with min A consistently PT Short Term Goal 2 (Week 3): Pt will complete least restrictive transfer with Supervision consistently PT Short Term Goal 3 (Week 3): Pt will perform w/c mobility and parts management with Supervision consistently  Skilled Therapeutic Interventions/Progress Updates:    Session 1:  Pt received seated in bed, agreeable to PT. Pt reports significant pain in R wrist, knee, and ankle this AM with any movement of the joints. Pain is not rated, RN notified as pain in ankle and wrist is new this date. Rolling L/R with mod A and use of bedrails for dependent donning of brief and pants. Supine to sit with mod A with use of bedrails and max cueing for safety. Sliding board transfer bed to w/c with mod A to the L with increase time and cueing needed to complete transfer. Pt's wife present during 2nd half of therapy session and provided home measurement sheet. Education with wife about safe w/c mobility in the home, need to complete hands-on family training later this week prior to d/c home, goals for pt to be a Supervision level pending progress and pain this week. Pt left seated in w/c in room with needs in reach, quick release belt and chair alarm in place, wife present.  Session 2: Pt received seated in w/c in room, agreeable to PT. Pt reports pain has improved since this AM. Pt declines to participate in any w/c mobility due to ongoing pain in R hand. Dependent transport via w/c to therapy gym. Slide board transfer w/c to mat table to level surface with min A to the L. Demonstration and explanation of transfer to patient's wife who is present during therapy  session. Slide board transfer mat table to w/c to the R with mod A due to increase in pain in R wrist and knee with transfer. Demonstration and explanation of management of w/c parts with patient's wife. Will continue with family education this week with wife planning on being present during therapy sessions on Tuesday and Thursday. Pt left seated in w/c in room with needs in reach, quick release belt and chair alarm in place, wife present.  Therapy Documentation Precautions:  Precautions Precautions: Cervical, Fall Precaution Comments: Reviewed cervical precautions Required Braces or Orthoses: Cervical Brace Cervical Brace: Hard collar, At all times Restrictions Weight Bearing Restrictions: No   Therapy/Group: Individual Therapy  Peter Congoaylor Fumiko Cham, PT, DPT  09/08/2018, 12:18 PM

## 2018-09-08 NOTE — Progress Notes (Addendum)
Speech Language Pathology Daily Session Note  Patient Details  Name: Brandon Collins MRN: [id number] Date of Birth: May 31, 1951  Today's Date: 09/09/2018 SLP Individual Time: 1030-1100 SLP Individual Time Calculation (min): 30 min  Short Term Goals: Week 2: SLP Short Term Goal 1 (Week 2): Pt will consume regular and thin liquid diet with min overt s/s aspiration and mod I use of swallow strategies. SLP Short Term Goal 2 (Week 2): Pt will demonstrate orientation to place, situation and time with min A verbal cues for use of external aid. SLP Short Term Goal 3 (Week 2): Pt will utilize external memory aids to recall new, mildly complex information with min A verbal cues. SLP Short Term Goal 4 (Week 2): Pt will complete semi-complex problem solving tasks with supervision A verbal cues during functional tasks. SLP Short Term Goal 5 (Week 2): Pt will self-monitor and self-correct functional errors with min A verbal cues. SLP Short Term Goal 6 (Week 2): Pt will demonstrate use of safety precuations in functional tasks with supervision A verbal cues.  Skilled Therapeutic Interventions:Skilled ST services focused on cognitive skills. SLP facilitated recall of medication function and dosage of current medication given external aid, list, pt required supervision A. SLP facilitated semi-complex problem solving skills utilizing medication management, pt required min A verbal cues. SLP recommends to continue medication management in upcoming session. Downgraded cognitive goals to Min due to inconsistent performance. Pt was left in room with call bell within reach and bed alarm set. Recommend to continue skilled ST services.      Pain Pain Assessment Pain Score: 0-No pain  Therapy/Group: Individual Therapy  Lasaro Primm  [facility] 09/09/2018, 11:40 AM

## 2018-09-08 NOTE — Progress Notes (Signed)
[place] PHYSICAL MEDICINE & REHABILITATION PROGRESS NOTE   Subjective/Complaints:  No issues overnite, bowels doing ok, had BM on toilet yesterday  ROS: Patient denies, nausea, vomiting, diarrhea, cough, shortness of breath or chest pain,    Objective:   No results found. No results for input(s): WBC, HGB, HCT, PLT in the last 72 hours. No results for input(s): NA, K, CL, CO2, GLUCOSE, BUN, CREATININE, CALCIUM in the last 72 hours.  Intake/Output Summary (Last 24 hours) at 09/08/2018 0848 Last data filed at 09/08/2018 0800 Gross per 24 hour  Intake 660 ml  Output 900 ml  Net -240 ml     Physical Exam: Vital Signs Blood pressure (!) 157/71, pulse 85, temperature 99 F (37.2 C), temperature source Oral, resp. rate 15, height 6' (1.829 m), weight 93.1 kg, SpO2 100 %.  Constitutional: No distress . Vital signs reviewed. HEENT: EOMI, oral membranes moist Neck: supple Cardiovascular: RRR without murmur. No JVD    Respiratory: CTA Bilaterally without wheezes or rales. Normal effort    GI: BS +, non-tender, non-distended  Musculoskeletal:  Lower extremity edema tr BLE Neurological. Alert, improved insight and awareness. Motor: Right upper extremity: 4+/5 prox to distal.  Left upper extremity: 5/5 proximal distal Bilateral lower extremities 2/5 RLE, 3- to 3/5 LLE Skin: Skin is warm and dry.  Surgical site remains  clean and dry  Psychiatric: more alert and animated     Assessment/Plan: 1. Functional deficits secondary to cervical myelopathy which require 3+ hours per day of interdisciplinary therapy in a comprehensive inpatient rehab setting.  Physiatrist is providing close team supervision and 24 hour management of active medical problems listed below.  Physiatrist and rehab team continue to assess barriers to discharge/monitor patient progress toward functional and medical goals  Care Tool:  Bathing    Body parts bathed by patient: Right arm, Left arm, Chest,  Abdomen, Front perineal area, Right upper leg, Left upper leg, Face, Left lower leg   Body parts bathed by helper: Buttocks, Right lower leg     Bathing assist Assist Level: Minimal Assistance - Patient > 75%     Upper Body Dressing/Undressing Upper body dressing   What is the patient wearing?: Pull over shirt    Upper body assist Assist Level: Supervision/Verbal cueing    Lower Body Dressing/Undressing Lower body dressing      What is the patient wearing?: Pants     Lower body assist Assist for lower body dressing: Minimal Assistance - Patient > 75%     Toileting Toileting    Toileting assist Assist for toileting: Total Assistance - Patient < 25%     Transfers Chair/bed transfer  Transfers assist  Chair/bed transfer activity did not occur: Safety/medical concerns  Chair/bed transfer assist level: Moderate Assistance - Patient 50 - 74%     Locomotion Ambulation   Ambulation assist   Ambulation activity did not occur: Safety/medical concerns          Walk 10 feet activity   Assist  Walk 10 feet activity did not occur: Safety/medical concerns        Walk 50 feet activity   Assist Walk 50 feet with 2 turns activity did not occur: Safety/medical concerns         Walk 150 feet activity   Assist Walk 150 feet activity did not occur: Safety/medical concerns         Walk 10 feet on uneven surface  activity   Assist Walk 10 feet on uneven surfaces  activity did not occur: Safety/medical concerns         Wheelchair     Assist Will patient use wheelchair at discharge?: Yes Type of Wheelchair: Manual Wheelchair activity did not occur: Safety/medical concerns  Wheelchair assist level: Supervision/Verbal cueing Max wheelchair distance: 150'    Wheelchair 50 feet with 2 turns activity    Assist    Wheelchair 50 feet with 2 turns activity did not occur: Safety/medical concerns   Assist Level: Supervision/Verbal cueing    Wheelchair 150 feet activity     Assist Wheelchair 150 feet activity did not occur: Safety/medical concerns   Assist Level: Supervision/Verbal cueing     Medical Problem List and Plan: 1.  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 Cervical collar at all times  -Continue CIR therapies including PT, OT   2.  DVT Prophylaxis/Anticoagulation: SCDs. Vascular study negative  -  lovenox 30mg  q12   3. Pain Management:  Oxycodone and Valium as needed.  -right ankle pain/swelling. 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 Mood:  Provide emotional support 5. Neuropsych: This patient is capable of making decisions on his own behalf.  -trazodone prn for sleep 6. Skin/Wound Care:  Routine skin checks 7. Fluids/Electrolytes/Nutrition, acute renal insufficiency:    Off ivf  -12/19 BMET nl  -push po  -holding lisinopril and metformin,  -liberalized diet per RD recs 8. Diabetes mellitus. Glucophage 1500 mg daily, Actos 15 mg daily, Amaryl 8 mg daily.   -glucophage held. reduced  l    CBG (last 3)  Recent Labs    09/07/18 1639 09/07/18 2105 09/08/18 0637  GLUCAP 118* 113* 79    -increase amaryl to 8mg  daily  -in range 9. Hypertension. Lisinopril 40 mg daily, clonidine 0.1 mg twice a day, Norvasc 10 mg daily.   -held lisinopril per above  -controlled 12/18  -no changes  10. Hyperlipidemia. Lipitor 11. Constipation. Laxative assistance 12. Leukocytosis:  Resolved but malodorous urine  -ucx + for 100k e coli  -sensitive to keflex---continue for 7 day course through 12/25 13. Colonic ileus: resolved  -belly soft , non distended   -stools now regular and loose--firming up. Continue to hold senokot-s  -advanced to regular diet  -continue OOB  -potassium 5 today 12/19, d/c kdur     LOS: 20 days A FACE TO FACE EVALUATION WAS PERFORMED  Erick Colacendrew E Kirsteins 09/08/2018, 8:48 AM

## 2018-09-08 NOTE — Plan of Care (Signed)
Shower transfer goal d/c 12/23. Please see POC for details

## 2018-09-09 ENCOUNTER — Inpatient Hospital Stay (HOSPITAL_COMMUNITY): Payer: Managed Care, Other (non HMO) | Admitting: Physical Therapy

## 2018-09-09 ENCOUNTER — Inpatient Hospital Stay (HOSPITAL_COMMUNITY): Payer: Managed Care, Other (non HMO)

## 2018-09-09 ENCOUNTER — Inpatient Hospital Stay (HOSPITAL_COMMUNITY): Payer: Managed Care, Other (non HMO) | Admitting: Occupational Therapy

## 2018-09-09 LAB — GLUCOSE, CAPILLARY
Glucose-Capillary: 108 mg/dL — ABNORMAL HIGH (ref 70–99)
Glucose-Capillary: 124 mg/dL — ABNORMAL HIGH (ref 70–99)
Glucose-Capillary: 153 mg/dL — ABNORMAL HIGH (ref 70–99)
Glucose-Capillary: 99 mg/dL (ref 70–99)

## 2018-09-09 NOTE — Progress Notes (Signed)
[place] PHYSICAL MEDICINE & REHABILITATION PROGRESS NOTE   Subjective/Complaints:  Bowels moving ok, remains confused , needs cues to operate call bell.   ROS: Patient denies, nausea, vomiting, diarrhea, cough, shortness of breath or chest pain,    Objective:   No results found. No results for input(s): WBC, HGB, HCT, PLT in the last 72 hours. No results for input(s): NA, K, CL, CO2, GLUCOSE, BUN, CREATININE, CALCIUM in the last 72 hours.  Intake/Output Summary (Last 24 hours) at 09/09/2018 0758 Last data filed at 09/09/2018 0720 Gross per 24 hour  Intake 300 ml  Output 1675 ml  Net -1375 ml     Physical Exam: Vital Signs Blood pressure 97/66, pulse 91, temperature 98 F (36.7 C), temperature source Oral, resp. rate 18, height 6' (1.829 m), weight 93.2 kg, SpO2 100 %.  Constitutional: No distress . Vital signs reviewed. HEENT: EOMI, oral membranes moist Neck: supple Cardiovascular: RRR without murmur. No JVD    Respiratory: CTA Bilaterally without wheezes or rales. Normal effort    GI: BS +, non-tender, non-distended  Musculoskeletal:  Lower extremity edema tr BLE Neurological. Alert, improved insight and awareness. Motor: Right upper extremity: 4+/5 prox to distal.  Left upper extremity: 5/5 proximal distal Bilateral lower extremities 2/5 RLE, 3- to 3/5 LLE Skin: Skin is warm and dry.  Surgical site remains  clean and dry  Psychiatric: more alert and animated     Assessment/Plan: 1. Functional deficits secondary to cervical myelopathy which require 3+ hours per day of interdisciplinary therapy in a comprehensive inpatient rehab setting.  Physiatrist is providing close team supervision and 24 hour management of active medical problems listed below.  Physiatrist and rehab team continue to assess barriers to discharge/monitor patient progress toward functional and medical goals  Care Tool:  Bathing    Body parts bathed by patient: Right arm, Left arm, Chest,  Abdomen, Front perineal area, Right upper leg, Left upper leg, Face, Left lower leg   Body parts bathed by helper: Buttocks, Right lower leg     Bathing assist Assist Level: Minimal Assistance - Patient > 75%     Upper Body Dressing/Undressing Upper body dressing   What is the patient wearing?: Pull over shirt    Upper body assist Assist Level: Supervision/Verbal cueing    Lower Body Dressing/Undressing Lower body dressing      What is the patient wearing?: Pants     Lower body assist Assist for lower body dressing: Minimal Assistance - Patient > 75%     Toileting Toileting    Toileting assist Assist for toileting: Total Assistance - Patient < 25%     Transfers Chair/bed transfer  Transfers assist  Chair/bed transfer activity did not occur: Safety/medical concerns  Chair/bed transfer assist level: Moderate Assistance - Patient 50 - 74%     Locomotion Ambulation   Ambulation assist   Ambulation activity did not occur: Safety/medical concerns          Walk 10 feet activity   Assist  Walk 10 feet activity did not occur: Safety/medical concerns        Walk 50 feet activity   Assist Walk 50 feet with 2 turns activity did not occur: Safety/medical concerns         Walk 150 feet activity   Assist Walk 150 feet activity did not occur: Safety/medical concerns         Walk 10 feet on uneven surface  activity   Assist Walk 10 feet on uneven  surfaces activity did not occur: Safety/medical concerns         Wheelchair     Assist Will patient use wheelchair at discharge?: Yes Type of Wheelchair: Manual Wheelchair activity did not occur: Safety/medical concerns  Wheelchair assist level: Supervision/Verbal cueing Max wheelchair distance: 150'    Wheelchair 50 feet with 2 turns activity    Assist    Wheelchair 50 feet with 2 turns activity did not occur: Safety/medical concerns   Assist Level: Supervision/Verbal cueing    Wheelchair 150 feet activity     Assist Wheelchair 150 feet activity did not occur: Safety/medical concerns   Assist Level: Supervision/Verbal cueing     Medical Problem List and Plan: 1.  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 Cervical collar at all times  Team conference today please see physician documentation under team conference tab, met with team face-to-face to discuss problems,progress, and goals. Formulized individual treatment plan based on medical history, underlying problem and comorbidities. 2.  DVT Prophylaxis/Anticoagulation: SCDs. Vascular study negative  -  lovenox 13m q12   3. Pain Management:  Oxycodone and Valium as needed.  -right ankle pain/swelling. 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 Mood:  Provide emotional support 5. Neuropsych: This patient is capable of making decisions on his own behalf.  -trazodone prn for sleep 6. Skin/Wound Care:  Routine skin checks 7. Fluids/Electrolytes/Nutrition, acute renal insufficiency:  Resolved   -12/19 BMET nl  -push po  -holding lisinopril and metformin,  -liberalized diet per RD recs 8. Diabetes mellitus. Glucophage 1500 mg daily, Actos 15 mg daily, Amaryl 8 mg daily.   -glucophage held. reduced  l    CBG (last 3)  Recent Labs    09/08/18 1640 09/08/18 2107 09/09/18 0635  GLUCAP 112* 124* 108*    -cont  amaryl to 435mdaily  -CBGs ok 12/24 9. Hypertension. Lisinopril 40 mg daily, clonidine 0.1 mg twice a day, Norvasc 10 mg daily.   -held lisinopril per above  - Vitals:   09/08/18 2011 09/09/18 0530  BP: 139/76 97/66  Pulse: 91 91  Resp: 18 18  Temp: 98 F (36.7 C) 98 F (36.7 C)  SpO2: 98% 100%  controlled 12/24  - 10. Hyperlipidemia. Lipitor 11. Constipation. Laxative assistance 12. Leukocytosis:  Resolved but malodorous urine  -ucx + for 100k e coli  -sensitive to keflex---continue for 7 day course through 12/25 13. Colonic ileus: resolved  -belly  soft , non distended   -stools now regular and loose--firming up. Continue to hold senokot-s  -advanced to regular diet  -continue OOB  -potassium normalized   LOS: 21 days A FACE TO FACE EVALUATION WAS PERFORMED  AnCharlett Blake2/24/2019, 7:58 AM

## 2018-09-09 NOTE — Progress Notes (Signed)
Occupational Therapy Session Note  Patient Details  Name: Geralynn OchsCarl S Tasker MRN: [id number] Date of Birth: 1951-01-05  Today's Date: 09/09/2018 OT Individual Time: [id number] and [id number] OT Individual Time Calculation (min): 75 min and 38 min    Short Term Goals: Week 3:  OT Short Term Goal 1 (Week 3): STGs=LTGs set at [employer]Min A overall   Skilled Therapeutic Interventions/Progress Updates:    Session 1: Upon entering the room, pt supine in bed with no c/o pain and agreeable to OT intervention. Pt donning LB clothing from bed level with mod A for rolling L <> R and max A for LB clothing management. OT provided total A to don B thigh high ted hose.  Pt performing supine >sit with mod A for trunk and LEs. Pt able to verbalize slideboard steps with mod multimodal cuing. Pt needing max A for transfer from EOB to wheelchair. Pt seated at sink for grooming tasks with set up A to complete tasks. Pt propelled wheelchair 20' and then verbalized pain in R wrist and OT assisted pt the rest of the way to day room. OT provided pt with calendar with days clearly marked off and pt needing mod cuing for orientation. OT provided pt with card game of "old maid" which he was familiar with. Pt performing dealing cards and needing min cuing for attending to task as well as turn taking this session. Pt returning to room and remained in wheelchair with chair alarm donned for safety and wife entering the room. Call bell and all needed items within reach.   Session 2: Upon entering the room, pt supine in bed and reports just returning to bed secondary to fatigue. Pt agreeable to B UE strengthening exercises with use of 3 lb dowel rod. OT demonstrates exercises with pt returning demonstrations and needing min cuing for proper technique. Pt performing 3 sets of 15 bicep curls, straight arm raises, chest presses, forward rowing, and backwards rowing with rest breaks as needed secondary to fatigue. Pt needing min cuing for counting  secondary to decreased attention. Pt remained in bed at end of session with call bell and all needed items within reach. Bed alarm activated.   Therapy Documentation Precautions:  Precautions Precautions: Cervical, Fall Precaution Comments: Reviewed cervical precautions Required Braces or Orthoses: Cervical Brace Cervical Brace: Hard collar, At all times Restrictions Weight Bearing Restrictions: No Pain: Pain Assessment Pain Score: 0-No pain ADL: ADL Eating: Supervision/safety Where Assessed-Eating: Bed level Grooming: Supervision/safety Where Assessed-Grooming: Wheelchair Upper Body Bathing: Supervision/safety Where Assessed-Upper Body Bathing: Other (Comment)(recliner) Lower Body Bathing: Other (comment), Minimal assistance Where Assessed-Lower Body Bathing: Other (Comment)(recliner) Upper Body Dressing: Supervision/safety Where Assessed-Upper Body Dressing: Other (Comment)(recliner) Lower Body Dressing: Moderate cueing Where Assessed-Lower Body Dressing: Other (Comment)(recliner) Toileting: Maximal assistance Where Assessed-Toileting: Bedside Commode Toilet Transfer: Minimal assistance Toilet Transfer Method: Squat pivot Toilet Transfer Equipment: Psychiatric nurseBedside commode Walk-In Shower Transfer: Not assessed   Therapy/Group: Individual Therapy  [name], Mari Battaglia P 09/09/2018, 12:33 PM

## 2018-09-09 NOTE — Progress Notes (Addendum)
Physical Therapy Session Note  Patient Details  Name: Brandon Collins MRN: [id number] Date of Birth: 1951/02/21  Today's Date: 09/09/2018 PT Individual Time: 1300-1400 PT Individual Time Calculation (min): 60 min   Short Term Goals: Week 3:  PT Short Term Goal 1 (Week 3): Pt will complete bed mobility with min A consistently PT Short Term Goal 2 (Week 3): Pt will complete least restrictive transfer with Supervision consistently PT Short Term Goal 3 (Week 3): Pt will perform w/c mobility and parts management with Supervision consistently  Skilled Therapeutic Interventions/Progress Updates:    Pt received seated in bed finishing lunch with assist from his wife [name]. Pt agreeable to PT session. Pt reports 5/10 pain in R knee at rest that increases with ROM. Pt educated that he has to request pain medication and that if he wants to take pain medication the best time to take it is prior to start of therapy session. RN notified that pt would like pain medication for R knee pain and she provides it during session. Notified RN that pt's R knee appears more swollen this date. Pt's family asking about xray to R knee as pt fell and hit his right knee prior to surgery on day of surgery, will follow up with medical team. Rolling L/R with mod A and use of bedrails with max A to don pants. Supine to sit with mod A. Slide board transfer bed to w/c with max A with increased time and cueing needed to perform. Manual w/c propulsion x 50 ft with BUE and min A for steering. Pt has onset of R hand pain with w/c mobility, declines to perform any more this session. Reviewed setup of safe slide board transfer with patient's wife [name] and stepdaughter Ginger. Pt performs transfer w/c to/from mat table with CGA to min A with increased time and cues needed. Pt left seated in w/c in room with needs in reach, quick release belt and chair alarm in place.  Therapy Documentation Precautions:  Precautions Precautions: Cervical,  Fall Precaution Comments: Reviewed cervical precautions Required Braces or Orthoses: Cervical Brace Cervical Brace: Hard collar, At all times Restrictions Weight Bearing Restrictions: No    Therapy/Group: Individual Therapy  Peter Congoaylor Arleth Mccullar, PT, DPT  09/09/2018, 3:15 PM

## 2018-09-09 NOTE — Patient Care Conference (Signed)
Inpatient RehabilitationTeam Conference and Plan of Care Update Date: 09/09/2018   Time: 11:45 AM    Patient Name: Brandon Collins      Medical Record Number: [id number]  Date of Birth: [birth year] Sex: Male         Room/Bed: 4W08C/4W08C-01 Payor Info: Payor: [employer] / Plan: CIGNA MANAGED / Product Type: *No Product type* /    Admitting Diagnosis: ACDF with myelopathy  Admit Date/Time:  08/19/2018 11:36 AM Admission Comments: No comment available   Primary Diagnosis:  <principal problem not specified> Principal Problem: <principal problem not specified>  Patient Active Problem List   Diagnosis Date Noted  . Pain   . Cervical myelopathy ([place]) 08/19/2018  . Cervical cord compression with myelopathy ([place])   . Postoperative pain   . Diabetes mellitus type 2 in nonobese ([facility])   . Essential hypertension   . Dyslipidemia   . Drug induced constipation   . Myelopathy ([place]) 08/12/2018    Expected Discharge Date: Expected Discharge Date: 09/13/18  Team Members Present: Physician leading conference: Dr. Alysia Penna Social Worker Present: Lennart Pall, LCSW Nurse Present: Dorien Chihuahua, RN PT Present: Other (comment)(Taylor Ervin Knack, PT) OT Present: Darleen Crocker, OT SLP Present: Charolett Bumpers, SLP PPS Coordinator present : Daiva Nakayama, RN, CRRN     Current Status/Progress Goal Weekly Team Focus  Medical   ileus resolved  maintain stabl eelectrolytes to avoid ileus recurrence, manage neurogenic bowel and bladder  teaching pt bowel and bladder program   Bowel/Bladder   continent of bowel and bladder episodes of incontinent, LBM  09-07-18  continent of bowel and bladder maintain regular bowel pattern  Assist with tolieting needs prn   Swallow/Nutrition/ Hydration   regular and thin supervision-mod I  mod I - goal met  tolerance of diet    ADL's   Mod A-2 helper slideboard transfer, Min A UB self care, Max A LB self care. Functional status fluctuates due to R UE/LE pain.  Min A  overall   NMR, cognitive remediation, pain mgt, general strengthening and endurance, pt/family education and dc planning   Mobility   min to mod A bed mobility, varies from CGA with squat pivot transfer to mod A with SB depending on pain  Supervision level with bed mobility and transfers  family training with wife   Communication             Safety/Cognition/ Behavioral Observations  Mod-supervision A, incosistent  Min A - downgraded from some Sup goals  semi-complex problem solving, memory notebook/recall strategies, error and safety awareness   Pain   c/o neck and right ankle pain managed with prn tramdol  <3  Assess pain q shift and prn   Skin   neck incision             *See Care Plan and progress notes for long and short-term goals.     Barriers to Discharge  Current Status/Progress Possible Resolutions Date Resolved   Physician    Medical stability;Other (comments)  still with mild confusion     cont rehab, family teaching      Nursing                  PT  Other (comments)  pain              OT Decreased caregiver support;Lack of/limited family support;Other (comments)  (R UE/LE pain)             SLP  SW                Discharge Planning/Teaching Needs:  Pt to d/c home with wife who is planning to cover 24/7 support.  Teaching started with wife on Monday 12/23   Team Discussion:  Ileus resolved.  Still with confusion.  C/o back and arm pain and pain affected family ed that was attempted yesterday with wife.  Very different in his abilities from day to day.  Was overall min assist last week and today requires max assist.  Cognitive deficits are significant.  Team concerned about wife's ability to manage him at home.  SW to follow up on d/c plan.  Revisions to Treatment Plan:  NA    Continued Need for Acute Rehabilitation Level of Care: The patient requires daily medical management by a physician with specialized training in physical medicine and  rehabilitation for the following conditions: Daily direction of a multidisciplinary physical rehabilitation program to ensure safe treatment while eliciting the highest outcome that is of practical value to the patient.: Yes Daily medical management of patient stability for increased activity during participation in an intensive rehabilitation regime.: Yes Daily analysis of laboratory values and/or radiology reports with any subsequent need for medication adjustment of medical intervention for : Post surgical problems;Neurological problems;Urological problems;Nutritional problems   I attest that I was present, lead the team conference, and concur with the assessment and plan of the team.   Marcele Kosta 09/09/2018, 2:12 PM

## 2018-09-10 LAB — GLUCOSE, CAPILLARY
Glucose-Capillary: 100 mg/dL — ABNORMAL HIGH (ref 70–99)
Glucose-Capillary: 120 mg/dL — ABNORMAL HIGH (ref 70–99)
Glucose-Capillary: 124 mg/dL — ABNORMAL HIGH (ref 70–99)
Glucose-Capillary: 123 mg/dL — ABNORMAL HIGH (ref 70–99)

## 2018-09-11 ENCOUNTER — Encounter (HOSPITAL_COMMUNITY): Payer: Managed Care, Other (non HMO)

## 2018-09-11 ENCOUNTER — Inpatient Hospital Stay (HOSPITAL_COMMUNITY): Payer: Managed Care, Other (non HMO) | Admitting: Physical Therapy

## 2018-09-11 ENCOUNTER — Ambulatory Visit (HOSPITAL_COMMUNITY): Payer: Managed Care, Other (non HMO)

## 2018-09-11 ENCOUNTER — Inpatient Hospital Stay (HOSPITAL_COMMUNITY): Payer: Managed Care, Other (non HMO) | Admitting: Speech Pathology

## 2018-09-11 LAB — GLUCOSE, CAPILLARY: Glucose-Capillary: 84 mg/dL (ref 70–99)

## 2018-09-11 NOTE — Progress Notes (Signed)
Occupational Therapy Session Note  Patient Details  Name: Brandon Collins MRN: [id number] Date of Birth: 01/29/1951  Today's Date: 09/11/2018 OT Individual Time: 1100-1155 OT Individual Time Calculation (min): 55 min    Skilled Therapeutic Interventions/Progress Updates:    1:1. Pt/family with decision to go SNF at d/c therefore family education/hands on training not provided. Pt reporting need to toilet and completes lateral scoot transfer without board with min A with VC for hand placement. Pt requires total A for clothing management/hygiene after BM with lateral leans. Pt washes hands and brushes teeth at sink iwht set up. Pt propels w/c to/from all tx spaces and sets up for transfer to mat with mod VC. Pt slide board transfer w/c<>EOM with min A overall after VC to stop using momentum and push with arms. Pt completes lateral leans to obtain towels and fold and place in bin as prep activity prior to clothing management at EOB. Pt completes 9HPT LUE 1 min 49 seconds RUE 1 min 25 seconds. Exited session with pt seated in w/c, call light in reach, family present and NT in room  Therapy Documentation Precautions:  Precautions Precautions: Cervical, Fall Precaution Comments: Reviewed cervical precautions Required Braces or Orthoses: Cervical Brace Cervical Brace: Hard collar, At all times Restrictions Weight Bearing Restrictions: No General:   Vital Signs:   Pain: Pain Assessment Pain Scale: 0-10 Pain Score: 5  Pain Type: Acute pain Pain Location: Hand Pain Orientation: Right Pain Descriptors / Indicators: Aching;Throbbing Pain Frequency: Intermittent Pain Onset: Gradual Patients Stated Pain Goal: 1 Pain Intervention(s): Medication (See eMAR) Multiple Pain Sites: Yes 2nd Pain Site Pain Score: 5 Pain Type: Acute pain Pain Location: Knee Pain Orientation: Right Pain Descriptors / Indicators: Aching Pain Frequency: Intermittent Pain Onset: Gradual Patient's Stated Pain Goal:  1 Pain Intervention(s): Medication (See eMAR)    Therapy/Group: Individual Therapy  Shon HaleStephanie M Tavari Loadholt 09/11/2018, 12:02 PM

## 2018-09-11 NOTE — Progress Notes (Signed)
Speech Language Pathology Weekly Progress and Session Note  Patient Details  Name: Brandon Collins MRN: [id number] Date of Birth: 1951-05-04  Beginning of progress report period: 09/03/2018 End of progress report period: 09/11/2018  Today's Date: 09/11/2018 SLP Individual Time: 1937-9024 SLP Individual Time Calculation (min): 28 min  Short Term Goals: Week 2: SLP Short Term Goal 1 (Week 2): Pt will consume regular and thin liquid diet with min overt s/s aspiration and mod I use of swallow strategies. SLP Short Term Goal 1 - Progress (Week 2): Met SLP Short Term Goal 2 (Week 2): Pt will demonstrate orientation to place, situation and time with min A verbal cues for use of external aid. SLP Short Term Goal 2 - Progress (Week 2): Met SLP Short Term Goal 3 (Week 2): Pt will utilize external memory aids to recall new, mildly complex information with min A verbal cues. SLP Short Term Goal 3 - Progress (Week 2): Met SLP Short Term Goal 4 (Week 2): Pt will complete semi-complex problem solving tasks with supervision A verbal cues during functional tasks. SLP Short Term Goal 4 - Progress (Week 2): Progressing toward goal SLP Short Term Goal 5 (Week 2): Pt will self-monitor and self-correct functional errors with min A verbal cues. SLP Short Term Goal 5 - Progress (Week 2): Progressing toward goal SLP Short Term Goal 6 (Week 2): Pt will demonstrate use of safety precuations in functional tasks with supervision A verbal cues. SLP Short Term Goal 6 - Progress (Week 2): Progressing toward goal    New Short Term Goals: Week 3: SLP Short Term Goal 1 (Week 3): STG=LTG due to remaining length of stay, awaiting SNF placement  Weekly Progress Updates:   Pt has made functional gains this reporting period and has met 3 out of 6 short term goals.  Pt is currently min assist for tasks due to mild cognitive deficits.  Pt has demonstrated improved recall of daily information.  Pt is consuming regular textures,  thin liquids diet with supervision cues for use of swallowing precautions.  Pt and family education is ongoing.  Pt would continue to benefit from skilled ST while inpatient in order to maximize functional independence and reduce burden of care prior to discharge.  Anticipate that pt will need SNF placement at discharge in addition to ST follow up at next level of care.      Intensity: Minumum of 1-2 x/day, 30 to 90 minutes Frequency: 3 to 5 out of 7 days Duration/Length of Stay: pending SNF placement Treatment/Interventions: Dysphagia/aspiration precaution training;Cognitive remediation/compensation;Cueing hierarchy;Functional tasks   Daily Session  Skilled Therapeutic Interventions: Pt was seen for skilled ST targeting cognitive goals.  Upon arrival, pt was seated in wheelchair with complaints of right knee pain.  Pt requested to be transferred back to bed.  Knee was elevated and ice applied.  Pt and family reported that RN was already aware of pain and medication had been administered.  Pt needed mod cues for recall of specific therapy activities.  Therapist assisted in recording information into memory notebook.  Pt was left in bed with bed alarm set and call bell within reach, family at bedside.  Goals updated on this date to reflect current progress and plan of care.       General    Pain Pain Assessment Pain Scale: 0-10 Pain Score: 0-No pain Faces Pain Scale: Hurts little more Pain Type: Acute pain Pain Location: Knee Pain Orientation: Right Pain Descriptors / Indicators: Aching Pain Intervention(s): Repositioned;Cold  applied  Therapy/Group: Individual Therapy  Dezyrae Kensinger, Selinda Orion 09/11/2018, 9:57 PM

## 2018-09-11 NOTE — Progress Notes (Signed)
Physical Therapy Session Note  Patient Details  Name: Brandon Collins MRN: [id number] Date of Birth: June 18, 1951  Today's Date: 09/11/2018 PT Individual Time: 6440-3474 PT Individual Time Calculation (min): 27 min   Short Term Goals: Week 3:  PT Short Term Goal 1 (Week 3): Pt will complete bed mobility with min A consistently PT Short Term Goal 2 (Week 3): Pt will complete least restrictive transfer with Supervision consistently PT Short Term Goal 3 (Week 3): Pt will perform w/c mobility and parts management with Supervision consistently  Skilled Therapeutic Interventions/Progress Updates:  Pt presented in bed agreeable to therapy however stating very fatigued from today's sessions. Session focused on lateral scoots at EOB for reinforcement of SB transfers. Pt performed supine to sit with use of bed features and assist for BLE management. Pt performed lateral scoots 3-4 at a time x 5 bouts in each direction with multimodal cues for increased forward flexion to facilitate increased forward flexion in BLE.as pt initially only pushing upwards. Pt demonstrating fair carryover with cues and last 2 bouts able to scoot 1-3 inches. Pt returned to bed modA for BLE management and performed rolling L/R with minA for proper repositioning of pad. Pt left in bed with RLE propped under pillow, call bell within reach and needs met.      Therapy Documentation Precautions:  Precautions Precautions: Cervical, Fall Precaution Comments: Reviewed cervical precautions Required Braces or Orthoses: Cervical Brace Cervical Brace: Hard collar, At all times Restrictions Weight Bearing Restrictions: No General:   Vital Signs: Therapy Vitals Temp: 98.5 F (36.9 C) Pulse Rate: 92 Resp: 16 BP: 126/72 Patient Position (if appropriate): Sitting Oxygen Therapy SpO2: 99 % O2 Device: Room Air Pain: Pain Assessment Pain Scale: 0-10 Pain Score: 5  Pain Type: Acute pain Pain Location: Knee Pain Orientation:  Right Pain Descriptors / Indicators: Aching Pain Frequency: Intermittent Pain Onset: Gradual Patients Stated Pain Goal: 1 Pain Intervention(s): Medication (See eMAR)    Therapy/Group: Individual Therapy  Elmer Boutelle  Daymion Nazaire, PTA  09/11/2018, 3:36 PM

## 2018-09-11 NOTE — Progress Notes (Signed)
Nutrition Follow-up  DOCUMENTATION CODES:   Obesity unspecified  INTERVENTION:   - Continue Regular diet  - Continue Ensure Enlive po BID, each supplement provides 350 kcal and 20 grams of protein  - Continue Magic cup BID with meals, each supplement provides 290 kcal and 9 grams of protein  - Continue snacks BID between meals  - Encourage adequate PO intake  NUTRITION DIAGNOSIS:   Inadequate oral intake related to altered GI function as evidenced by meal completion < 50%, other (clear liquid diet order x 6 days).  Progressing, pt on Regular diet with PO 25-50% at meals  GOAL:   Patient will meet greater than or equal to 90% of their needs  Progressing  MONITOR:   PO intake, Supplement acceptance, Diet advancement, Skin, Labs, Weight trends  REASON FOR ASSESSMENT:   Consult Poor PO  ASSESSMENT:   67 year old male with PMH significant for HTN, T2DM, cervical myelopathy with cord compression, and myelomalacia C3-C7 with progressive decline for the past 6 months. Pt presented 07/2618 with new onset BUE weakness,LLE weakness, and balance deficits with noted falls as well as difficulty with basic ADLs. Pt underwent anterior cervical decompression and fusion C3-4, 4-5, 5-6 and C6-7 as well as placement of anterior instrumentation.  12/3 - admitted to rehab 12/5 - KUB consistent with ileus, clear liquid diet, NGT placed by IR but pt pulled out later, NGT replaced in the evening 12/9 - pt pulled NGT, reinserted, later removed 12/12 - diet advanced to full liquids 12/13 - diet advanced to Carb Mod  Overall, weight continues to trend down since admission to CIR. Suspect this is related to ongoing poor PO intake. Discussed with RN who reports pt drinks supplements if he does not like the food on his meal trays. Will continue to encourage PO intake.  Meal Completion: 25-50% x last 8 meals  Medications reviewed and include: vitamin D-3, Ensure Enlive TID, Megace, Protonix,  Actos  Labs reviewed. CBG's 84, 123, 124 x 24 hours  Diet Order:   Diet Order            Diet regular Room service appropriate? Yes; Fluid consistency: Thin  Diet effective now              EDUCATION NEEDS:   Education needs have been addressed  Skin:  Skin Assessment: Skin Integrity Issues: Stage I: right ankle Incisions: incision  Last BM:  12/26 (smear type 4)  Height:   Ht Readings from Last 1 Encounters:  08/19/18 6' (1.829 m)    Weight:   Wt Readings from Last 1 Encounters:  09/11/18 91.5 kg    Ideal Body Weight:  80.9 kg  BMI:  Body mass index is 27.36 kg/m.  Estimated Nutritional Needs:   Kcal:  2300-2500  Protein:  115-130 grams  Fluid:  >/= 2.3 L    Earma ReadingKate Jablonski Almee Pelphrey, MS, RD, LDN Inpatient Clinical Dietitian Pager: [phone] Weekend/After Hours: [phone]

## 2018-09-11 NOTE — Progress Notes (Addendum)
Physical Therapy Session Note  Patient Details  Name: Brandon Collins MRN: [id number] Date of Birth: 1951-02-23  Today's Date: 09/11/2018 PT Individual Time: 1000-1100 PT Individual Time Calculation (min): 60 min   Short Term Goals: Week 3:  PT Short Term Goal 1 (Week 3): Pt will complete bed mobility with min A consistently PT Short Term Goal 2 (Week 3): Pt will complete least restrictive transfer with Supervision consistently PT Short Term Goal 3 (Week 3): Pt will perform w/c mobility and parts management with Supervision consistently  Skilled Therapeutic Interventions/Progress Updates:   Assisted pt with donning of pants prior to OOB- PT threaded pants from bed level and pt able to assist with pulling them up some, did require assist to get over bottom. Min assist for rolling using bed rails for support. Mod assist for supine -> sit with multimodal cues for technique. Required max assist to complete slideboard transfer initially to w/c with cues for hand placement and technique. Pt tendency to try to just use momentum and extraneous movement during transfer. Focused mat work for Automatic DataMR to address controlling movement, utilizing LE's to assist with transfer, and improving trunk activation to aid with transfer technique. Mod assist transfer w/c -> mat and then min assist mat to w/c after NMR with cues as described above. Propped on elbow x 10 reps to R and to L with focus on trunk activation, supine <> sit and rolling on mat with focus on technique and trunk activation, modified bridging in supine x 10 reps x 2 sets, and scooting on edge of mat x 5 reps to the R and to the L x 2 sets each direction with focus on lifting bottom and pt demonstrating improved controlled movement. Demonstrated good carryover of technique during transfer back to w/c at end of session. Seated engaged in UE strengthening with 3# straight weight for chest press and shoulder retraction x 10 reps each and LE strength/ROM and  coordination to kick ball and perform kinetron from seated position (20 cycles x 2 sets). Family present to observe session but did not perform hands on family education as pt is now SNF placement.   Addendum: pt engaged in w/c mobility with BUE on unit throughout session for functional strengthening and mobility training at supervision level up to 150'  Therapy Documentation Precautions:  Precautions Precautions: Cervical, Fall Precaution Comments: Reviewed cervical precautions Required Braces or Orthoses: Cervical Brace Cervical Brace: Hard collar, At all times Restrictions Weight Bearing Restrictions: No    Pain: Initially denies pain but throughout session complains of knee pain in R knee with movement. Ok once repositioned or rest break provided.   Therapy/Group: Individual Therapy  Karolee StampsGray, Mansa Willers Darrol PokeBrescia  Tyrik Stetzer B. Phillipa Morden, PT, DPT, CBIS  09/11/2018, 11:15 AM

## 2018-09-11 NOTE — Progress Notes (Signed)
[place] PHYSICAL MEDICINE & REHABILITATION PROGRESS NOTE   Subjective/Complaints:  Up in bed. States he's feel;ing better.   ROS: Patient denies fever, rash, sore throat, blurred vision, nausea, vomiting, diarrhea, cough, shortness of breath or chest pain, joint or back pain, headache, or mood change.     Objective:   No results found. No results for input(s): WBC, HGB, HCT, PLT in the last 72 hours. No results for input(s): NA, K, CL, CO2, GLUCOSE, BUN, CREATININE, CALCIUM in the last 72 hours.  Intake/Output Summary (Last 24 hours) at 09/11/2018 0941 Last data filed at 09/11/2018 0700 Gross per 24 hour  Intake 437 ml  Output 550 ml  Net -113 ml     Physical Exam: Vital Signs Blood pressure 138/79, pulse 90, temperature 98.1 F (36.7 C), temperature source Oral, resp. rate 18, height 6' (1.829 m), weight 91.5 kg, SpO2 96 %.  Constitutional: No distress . Vital signs reviewed. HEENT: EOMI, oral membranes moist Neck: supple Cardiovascular: RRR without murmur. No JVD    Respiratory: CTA Bilaterally without wheezes or rales. Normal effort    GI: BS +, non-tender, non-distended  Musculoskeletal:  Lower extremity edema tr BLE Neurological. alert. Motor: Right upper extremity: 4+/5 prox to distal.  Left upper extremity: 5/5 proximal distal Bilateral lower extremities 2/5 RLE, 3- to 3/5 LLE Skin: Skin is warm and dry.  Surgical site cdi Psychiatric: pleasant     Assessment/Plan: 1. Functional deficits secondary to cervical myelopathy which require 3+ hours per day of interdisciplinary therapy in a comprehensive inpatient rehab setting.  Physiatrist is providing close team supervision and 24 hour management of active medical problems listed below.  Physiatrist and rehab team continue to assess barriers to discharge/monitor patient progress toward functional and medical goals  Care Tool:  Bathing    Body parts bathed by patient: Right arm, Left arm, Chest, Abdomen,  Front perineal area, Right upper leg, Left upper leg, Face, Left lower leg   Body parts bathed by helper: Buttocks, Right lower leg     Bathing assist Assist Level: Minimal Assistance - Patient > 75%     Upper Body Dressing/Undressing Upper body dressing   What is the patient wearing?: Pull over shirt    Upper body assist Assist Level: Supervision/Verbal cueing    Lower Body Dressing/Undressing Lower body dressing      What is the patient wearing?: Pants     Lower body assist Assist for lower body dressing: Minimal Assistance - Patient > 75%     Toileting Toileting    Toileting assist Assist for toileting: Total Assistance - Patient < 25%     Transfers Chair/bed transfer  Transfers assist  Chair/bed transfer activity did not occur: Safety/medical concerns  Chair/bed transfer assist level: Maximal Assistance - Patient 25 - 49%     Locomotion Ambulation   Ambulation assist   Ambulation activity did not occur: Safety/medical concerns          Walk 10 feet activity   Assist  Walk 10 feet activity did not occur: Safety/medical concerns        Walk 50 feet activity   Assist Walk 50 feet with 2 turns activity did not occur: Safety/medical concerns         Walk 150 feet activity   Assist Walk 150 feet activity did not occur: Safety/medical concerns         Walk 10 feet on uneven surface  activity   Assist Walk 10 feet on uneven surfaces activity  did not occur: Safety/medical concerns         Wheelchair     Assist Will patient use wheelchair at discharge?: Yes Type of Wheelchair: Manual Wheelchair activity did not occur: Safety/medical concerns  Wheelchair assist level: Minimal Assistance - Patient > 75% Max wheelchair distance: 6250'    Wheelchair 50 feet with 2 turns activity    Assist    Wheelchair 50 feet with 2 turns activity did not occur: Safety/medical concerns   Assist Level: Minimal Assistance - Patient >  75%   Wheelchair 150 feet activity     Assist Wheelchair 150 feet activity did not occur: Safety/medical concerns   Assist Level: Supervision/Verbal cueing     Medical Problem List and Plan: 1.  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 Cervical collar at all times  -Continue CIR therapies including PT, OT 2.  DVT Prophylaxis/Anticoagulation: SCDs. Vascular study negative  -  lovenox 30mg  q12   3. Pain Management:  Oxycodone and Valium as needed.  -right ankle pain/swelling. 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 Mood:  Provide emotional support 5. Neuropsych: This patient is capable of making decisions on his own behalf.  -trazodone prn for sleep 6. Skin/Wound Care:  Routine skin checks 7. Fluids/Electrolytes/Nutrition, acute renal insufficiency:  Resolved   -12/19 BMET nl  -push po  -holding lisinopril and metformin,  -liberalized diet per RD recs 8. Diabetes mellitus. Glucophage 1500 mg daily, Actos 15 mg daily, Amaryl 4 mg daily.   -glucophage held. reduced  l    CBG (last 3)  Recent Labs    09/10/18 1650 09/10/18 2134 09/11/18 0705  GLUCAP 124* 123* 84       -CBGs ok 12/26---can dc checks 9. Hypertension. Lisinopril 40 mg daily, clonidine 0.1 mg twice a day, Norvasc 10 mg daily.   -held lisinopril per above  - Vitals:   09/10/18 1955 09/11/18 0509  BP: (!) 149/85 138/79  Pulse: 98 90  Resp: 18 18  Temp: 98.6 F (37 C) 98.1 F (36.7 C)  SpO2: 97% 96%  controlled 12/26  - 10. Hyperlipidemia. Lipitor 11. Constipation. Laxative assistance 12. Leukocytosis:  Resolved but malodorous urine  -ucx + for 100k e coli  -sensitive to keflex---completed 13. Colonic ileus: resolved  -belly soft , non distended    -stools now regular and loose--firming up. Continue to hold senokot-s  -advanced to regular diet  -continue OOB  -potassium normal   LOS: 23 days A FACE TO FACE EVALUATION WAS PERFORMED  Brandon Collins 09/11/2018, 9:41 AM

## 2018-09-11 NOTE — Progress Notes (Signed)
Physical Therapy Session Note  Patient Details  Name: Brandon Collins MRN: [id number] Date of Birth: 1950/10/30  Today's Date: 09/11/2018 PT Individual Time: 1400-1430 PT Individual Time Calculation (min): 30 min   Short Term Goals: Week 3:  PT Short Term Goal 1 (Week 3): Pt will complete bed mobility with min A consistently PT Short Term Goal 2 (Week 3): Pt will complete least restrictive transfer with Supervision consistently PT Short Term Goal 3 (Week 3): Pt will perform w/c mobility and parts management with Supervision consistently  Skilled Therapeutic Interventions/Progress Updates:    Patient received in bed with ice on R knee, family present and supportive, and patient willing to participate in therapy. Focused on functional stretching of B heel cords and hamstrings to tolerance, noting mild contracture in R hamstrings group due to patient likely keeping it flexed for long periods of time, educated on positioning of R knee to avoid further contracture. Also performed gross edema massage to R knee as well as gentle overpressure into extension and gentle patella mobilizations as tolerated. Patient with difficulty giving feedback due to cognitive status, often stating "its getting interesting" instead of specific details in relation to how knee was feeling. He was left in bed with R knee positioned in extension instead of flexed position, family present, bed alarm active, and all needs otherwise met this afternoon.   Therapy Documentation Precautions:  Precautions Precautions: Cervical, Fall Precaution Comments: Reviewed cervical precautions Required Braces or Orthoses: Cervical Brace Cervical Brace: Hard collar, At all times Restrictions Weight Bearing Restrictions: No General:   Pain: Pain Assessment Pain Scale: 0-10 Pain Score: 4  Pain Type: Acute pain Pain Location: Knee Pain Orientation: Right Pain Descriptors / Indicators: Aching Pain Frequency: Intermittent Pain Onset:  On-going Patients Stated Pain Goal: 0 Pain Intervention(s): Cold applied;Repositioned;Elevated extremity Multiple Pain Sites: No    Therapy/Group: Individual Therapy  Deniece Ree PT, DPT, CBIS  Supplemental Physical Therapist [facility]    Pager [phone] Acute Rehab Office [phone]   09/11/2018, 3:47 PM

## 2018-09-11 NOTE — Progress Notes (Signed)
Social Work Patient ID: Brandon Collins, male   DOB: 07/23/1951, 67 y.o.   MRN: [id number]   Have met with pt and wife to discuss d/c plans.  Both are agreed that pt's current LOC is greater than wife can manage and request that I begin search for SNF bed to further continue therapies.  Tx team aware.  Marice Guidone, LCSW

## 2018-09-11 NOTE — Plan of Care (Signed)
Downgraded transfer goal and bed mobility to min assist due to fluctuating levels of assist needed. Discharged Car transfer goal due to now SNF placement.

## 2018-09-12 ENCOUNTER — Inpatient Hospital Stay (HOSPITAL_COMMUNITY): Payer: Managed Care, Other (non HMO) | Admitting: Occupational Therapy

## 2018-09-12 ENCOUNTER — Inpatient Hospital Stay (HOSPITAL_COMMUNITY): Payer: Managed Care, Other (non HMO) | Admitting: Speech Pathology

## 2018-09-12 ENCOUNTER — Inpatient Hospital Stay (HOSPITAL_COMMUNITY): Payer: Managed Care, Other (non HMO) | Admitting: Physical Therapy

## 2018-09-12 LAB — BASIC METABOLIC PANEL
Anion gap: 12 (ref 5–15)
BUN: 22 mg/dL (ref 8–23)
CO2: 21 mmol/L — ABNORMAL LOW (ref 22–32)
Calcium: 8.9 mg/dL (ref 8.9–10.3)
Chloride: 101 mmol/L (ref 98–111)
Creatinine, Ser: 0.78 mg/dL (ref 0.61–1.24)
GFR calc Af Amer: >60 mL/min (ref >60)
GFR calc non Af Amer: >60 mL/min (ref >60)
Glucose, Bld: 218 mg/dL — ABNORMAL HIGH (ref 70–99)
Potassium: 4 mmol/L (ref 3.5–5.1)
Sodium: 134 mmol/L — ABNORMAL LOW (ref 135–145)

## 2018-09-12 NOTE — Progress Notes (Signed)
Physical Therapy Session Note  Patient Details  Name: Brandon Collins MRN: [id number] Date of Birth: 19-Sep-1950  Today's Date: 09/12/2018 PT Individual Time: 0900-1011 PT Individual Time Calculation (min): 71 min   Short Term Goals: Week 3:  PT Short Term Goal 1 (Week 3): Pt will complete bed mobility with min A consistently PT Short Term Goal 2 (Week 3): Pt will complete least restrictive transfer with Supervision consistently PT Short Term Goal 3 (Week 3): Pt will perform w/c mobility and parts management with Supervision consistently  Skilled Therapeutic Interventions/Progress Updates:    pt rec'd in bed after just completing OT session.  Pt requests to brush teeth at sink.  Pt mod A for supine to sit, max A for sliding board transfer to w/c.  Pt able to complete brushing teeth with supervision.  Pt performs w/c mobility throughout unit with supervision.  W/c obstacle course with mod cuing, supervision assist.  Sliding board transfers to mat and w/c with min A for transfer, min A for board placement.  Attempt supine therex, pt too uncomfortable in supine despite trial of different positioning methods, pt states "I think I overdid it yesterday".  Pt mod A for supine to sit. Pt able to perform seated AROM on Lt LE and AAROM on RT LE for knee, ankle and hip.  Lateral leans for trunk control and strengthening 2 x 10 bilat.  Pt left in bed with needs at hand, alarm set.  Therapy Documentation Precautions:  Precautions Precautions: Cervical, Fall Precaution Comments: Reviewed cervical precautions Required Braces or Orthoses: Cervical Brace Cervical Brace: Hard collar, At all times Restrictions Weight Bearing Restrictions: No General: PT Amount of Missed Time (min): 19 Minutes PT Missed Treatment Reason: Patient fatigue Pain:  pt c/o bilat knee pain during knee extension, eases with rest and repositioning   Therapy/Group: Individual Therapy  Dozier Berkovich 09/12/2018, 10:15 AM

## 2018-09-12 NOTE — Progress Notes (Signed)
Speech Language Pathology Daily Session Note  Patient Details  Name: Brandon OchsCarl S Signer MRN: [id number] Date of Birth: Nov 08, 1950  Today's Date: 09/12/2018 SLP Individual Time: [id number] SLP Individual Time Calculation (min): 28 min  Short Term Goals: Week 3: SLP Short Term Goal 1 (Week 3): STG=LTG due to remaining length of stay, awaiting SNF placement  Skilled Therapeutic Interventions:  Pt was seen for skilled ST targeting cognitive goals.  SLP facilitated the session with medication management tasks to address problem solving goals.  Pt needed mod assist verbal cues for task organization and error awareness when loading pills into a 4x daily pill box.  Task was not completed due to slowed processing speed and time constraints.  May benefit from additional practice in future therapy sessions.  Pt was left in bed with bed alarm set and call bell within reach, wife at bedside.  Continue per current plan of care.    Pain Pain Assessment Pain Scale: 0-10 Pain Score: 0-No pain   Therapy/Group: Individual Therapy  Green [place], Melanee SpryNicole L 09/12/2018, 4:12 PM

## 2018-09-12 NOTE — Progress Notes (Signed)
[place] PHYSICAL MEDICINE & REHABILITATION PROGRESS NOTE   Subjective/Complaints:  No new complaints. Slept well. Denies pain. Right knee only bothers him a little bit and has been a chronic problem  ROS: Patient denies fever, rash, sore throat, blurred vision, nausea, vomiting, diarrhea, cough, shortness of breath or chest pain, joint or back pain, headache, or mood change.     Objective:   No results found. No results for input(s): WBC, HGB, HCT, PLT in the last 72 hours. No results for input(s): NA, K, CL, CO2, GLUCOSE, BUN, CREATININE, CALCIUM in the last 72 hours.  Intake/Output Summary (Last 24 hours) at 09/12/2018 0935 Last data filed at 09/12/2018 0912 Gross per 24 hour  Intake 240 ml  Output 325 ml  Net -85 ml     Physical Exam: Vital Signs Blood pressure (!) 146/68, pulse 84, temperature (!) 97.5 F (36.4 C), resp. rate 18, height 6' (1.829 m), weight 91.5 kg, SpO2 98 %.  Constitutional: No distress . Vital signs reviewed. HEENT: EOMI, oral membranes moist Neck: supple Cardiovascular: RRR without murmur. No JVD    Respiratory: CTA Bilaterally without wheezes or rales. Normal effort    GI: BS +, non-tender, non-distended  Musculoskeletal:  Lower extremity edema tr BLE Neurological. alert. Motor: Right upper extremity: 4+/5 prox to distal.  Left upper extremity: 5/5 proximal distal Bilateral lower extremities 2/5 RLE, 3- to 3/5 LLE Skin: Skin is warm and dry.  Surgical site cdi Psychiatric: pleasant     Assessment/Plan: 1. Functional deficits secondary to cervical myelopathy which require 3+ hours per day of interdisciplinary therapy in a comprehensive inpatient rehab setting.  Physiatrist is providing close team supervision and 24 hour management of active medical problems listed below.  Physiatrist and rehab team continue to assess barriers to discharge/monitor patient progress toward functional and medical goals  Care Tool:  Bathing    Body  parts bathed by patient: Right arm, Left arm, Chest, Abdomen, Front perineal area, Right upper leg, Left upper leg, Face   Body parts bathed by helper: Buttocks, Right lower leg, Left lower leg     Bathing assist Assist Level: Moderate Assistance - Patient 50 - 74%     Upper Body Dressing/Undressing Upper body dressing   What is the patient wearing?: Pull over shirt    Upper body assist Assist Level: Moderate Assistance - Patient 50 - 74%    Lower Body Dressing/Undressing Lower body dressing      What is the patient wearing?: Pants     Lower body assist Assist for lower body dressing: Maximal Assistance - Patient 25 - 49%     Toileting Toileting    Toileting assist Assist for toileting: Total Assistance - Patient < 25%     Transfers Chair/bed transfer  Transfers assist  Chair/bed transfer activity did not occur: Safety/medical concerns  Chair/bed transfer assist level: Maximal Assistance - Patient 25 - 49%     Locomotion Ambulation   Ambulation assist   Ambulation activity did not occur: Safety/medical concerns          Walk 10 feet activity   Assist  Walk 10 feet activity did not occur: Safety/medical concerns        Walk 50 feet activity   Assist Walk 50 feet with 2 turns activity did not occur: Safety/medical concerns         Walk 150 feet activity   Assist Walk 150 feet activity did not occur: Safety/medical concerns  Walk 10 feet on uneven surface  activity   Assist Walk 10 feet on uneven surfaces activity did not occur: Safety/medical concerns         Wheelchair     Assist Will patient use wheelchair at discharge?: Yes Type of Wheelchair: Manual Wheelchair activity did not occur: Safety/medical concerns  Wheelchair assist level: Supervision/Verbal cueing Max wheelchair distance: 150'    Wheelchair 50 feet with 2 turns activity    Assist    Wheelchair 50 feet with 2 turns activity did not occur:  Safety/medical concerns   Assist Level: Supervision/Verbal cueing   Wheelchair 150 feet activity     Assist Wheelchair 150 feet activity did not occur: Safety/medical concerns   Assist Level: Supervision/Verbal cueing     Medical Problem List and Plan: 1.  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 Cervical collar at all times  -Continue CIR therapies including PT, OT 2.  DVT Prophylaxis/Anticoagulation: SCDs. Vascular study negative  -  lovenox 30mg  q12   3. Pain Management:  Oxycodone and Valium as needed.  -right ankle pain/swelling. 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 Mood:  Provide emotional support 5. Neuropsych: This patient is capable of making decisions on his own behalf.  -trazodone prn for sleep 6. Skin/Wound Care:  Routine skin checks 7. Fluids/Electrolytes/Nutrition, acute renal insufficiency:  Resolved   -12/19 BMET nl--recheck bmet today  -push po  -holding lisinopril and metformin,    8. Diabetes mellitus. Glucophage 1500 mg daily (held), Actos 15 mg daily, Amaryl 4 mg daily.         CBG (last 3)  Recent Labs    09/10/18 1650 09/10/18 2134 09/11/18 0705  GLUCAP 124* 123* 84       -CBGs well controlled. dc'ed routine checks 9. Hypertension. Lisinopril 40 mg daily, clonidine 0.1 mg twice a day, Norvasc 10 mg daily.   -held lisinopril per above  - Vitals:   09/11/18 1953 09/12/18 0417  BP: 135/64 (!) 146/68  Pulse: 90 84  Resp: 20 18  Temp: 98.7 F (37.1 C) (!) 97.5 F (36.4 C)  SpO2: 97% 98%  controlled 12/27  - 10. Hyperlipidemia. Lipitor 11. Constipation. Laxative assistance 12. Leukocytosis:  Resolved but malodorous urine  -ucx + for 100k e coli  -sensitive to keflex---completed 13. Colonic ileus: resolved  -belly soft , non distended    -stools now regular and loose--firming up. Continue to hold senokot-s  -advanced to regular diet  -continue OOB  -potassium normal--recheck  today   LOS: 24 days A FACE TO FACE EVALUATION WAS PERFORMED  Brandon OysterZachary T Danyia Collins 09/12/2018, 9:35 AM

## 2018-09-12 NOTE — Progress Notes (Signed)
Occupational Therapy Weekly Progress Note  Patient Details  Name: Brandon Collins MRN: [id number] Date of Birth: 04-27-1951  Beginning of progress report period: 09/05/18 End of progress report period: 09/12/18  Today's Date: 09/12/2018 OT Individual Time: 7628-3151 OT Individual Time Calculation (min): 57 min    Patient has not met LTGs at time of report, which had been set for STGs due to ELOS. Pt's functional status fluctuates due to cognition and R UE/LE pain. He can complete slideboard or scoot transfers with Min-Max A, and self care tasks bedlevel/EOB with Min-Max A depending on the day. Spouse has been present for family education. She and pt have decided that SNF is most appropriate next venue of care until pts care is manageable for home. Pt planned to d/c SNF at this time.      Patient continues to demonstrate the following deficits: muscle weakness, decreased cardiorespiratoy endurance and pain, unbalanced muscle activation, decreased coordination and decreased motor planning, decreased initiation, decreased attention, decreased awareness, decreased problem solving, decreased safety awareness and decreased memory and decreased sitting balance, decreased standing balance and decreased balance strategies and therefore will continue to benefit from skilled OT intervention to enhance overall performance with BADL.  Patient progressing toward long term goals..  Continue plan of care.  OT Short Term Goals Week 3:  OT Short Term Goal 1 (Week 3): STGs=LTGs set at [employer] A overall  OT Short Term Goal 1 - Progress (Week 3): Progressing toward goal Week 4:  OT Short Term Goal 1 (Week 4): STGs=LTGs due to ELOS  Skilled Therapeutic Interventions/Progress Updates:    Pt greeted in bed, requesting to eat breakfast. Worked on sitting balance while eating breakfast EOB (Mod A supine<sit). Pt utilizing forward leaning and lateral leaning postures during rest periods. Per pt, "I just give out." Min vcs  for slowing rate of food consumption and supervision for balance. Afterwards pt engaged in UB/LE bathing EOB. Pt with posterior LOB when attempting to place L LE into figure 4 to wash foot. OT assisted with placing L LE into this position while washing foot, with pt requiring bilateral UE support for balance. Unable to tolerate much movement with R LE due to pain. OT washed Rt foot and donned B Teds and footwear. Perihygiene and LB dressing completed bedlevel with pt rolling Rt>Lt with Min-Mod A. Pt at this point reported back pain was too painful to assist with threading LEs into pants, and therefore required increased assist. He donned overhead shirt with Mod A while pulling himself up with B bedrails to lower shirt over trunk. At end of session pt was left comfortably in bed, opting to sit up with back unsupported for pain relief. He was left with all needs and bed alarm set, anticipating next therapist.   Therapy Documentation Precautions:  Precautions Precautions: Cervical, Fall Precaution Comments: Reviewed cervical precautions Required Braces or Orthoses: Cervical Brace Cervical Brace: Hard collar, At all times Restrictions Weight Bearing Restrictions: No Vital Signs: Therapy Vitals Temp: (!) 97.5 F (36.4 C) Pulse Rate: 84 Resp: 18 BP: (!) 146/68 Patient Position (if appropriate): Lying Oxygen Therapy SpO2: 98 % O2 Device: Room Air Pain: In back and R LE. RN made aware for pain medication. Multiple rest breaks provided throughout tx to accommodate pain    ADL: ADL Eating: Supervision/safety Where Assessed-Eating: Bed level Grooming: Supervision/safety Where Assessed-Grooming: Wheelchair Upper Body Bathing: Supervision/safety Where Assessed-Upper Body Bathing: Other (Comment)(recliner) Lower Body Bathing: Other (comment), Minimal assistance Where Assessed-Lower Body Bathing: Other (Comment)(recliner)  Upper Body Dressing: Supervision/safety Where Assessed-Upper Body Dressing:  Other (Comment)(recliner) Lower Body Dressing: Moderate cueing Where Assessed-Lower Body Dressing: Other (Comment)(recliner) Toileting: Maximal assistance Where Assessed-Toileting: Bedside Commode Toilet Transfer: Minimal assistance Toilet Transfer Method: Engineer, water: Geophysical data processor: Not assessed Vision   Perception    Praxis   Exercises:   Other Treatments:     Therapy/Group: Individual Therapy  Reegan Bouffard A Fatumata Kashani 09/12/2018, 7:12 AM

## 2018-09-12 NOTE — Progress Notes (Signed)
Physical Therapy Weekly Progress Note  Patient Details  Name: Brandon Collins MRN: [id number] Date of Birth: [birth year]  Beginning of progress report period: September 03, 2018 End of progress report period: September 12, 2018  Today's Date: 09/12/2018  Patient has met 1 of 3 short term goals.  Pt has made variable progress during his rehab stay ranging from [place] with squat pivot transfers to needing max A with slide board transfers depending on pt pain level and ability to assist with transfers. Pt is anywhere from min A to mod A with bed mobility. Pt is consistently Supervision level with w/c mobility.  Patient continues to demonstrate the following deficits muscle weakness and muscle joint tightness, abnormal tone, unbalanced muscle activation and decreased coordination and decreased postural control and decreased balance strategies and therefore will continue to benefit from skilled PT intervention to increase functional independence with mobility.  Patient not progressing toward long term goals.  See goal revision..  Continue plan of care. Goals have been downgrade due to inconsistencies with progress. Discharge plan is now for patient to go to SNF as his wife is not able to provide the level of physical assist he will require at home.  PT Short Term Goals Week 3:  PT Short Term Goal 1 (Week 3): Pt will complete bed mobility with min A consistently PT Short Term Goal 1 - Progress (Week 3): Progressing toward goal PT Short Term Goal 2 (Week 3): Pt will complete least restrictive transfer with Supervision consistently PT Short Term Goal 2 - Progress (Week 3): Not met PT Short Term Goal 3 (Week 3): Pt will perform w/c mobility and parts management with Supervision consistently PT Short Term Goal 3 - Progress (Week 3): Met Week 4:  PT Short Term Goal 1 (Week 4): =LTG due to ELOS   Therapy Documentation Precautions:  Precautions Precautions: Cervical, Fall Precaution Comments: Reviewed  cervical precautions Required Braces or Orthoses: Cervical Brace Cervical Brace: Hard collar, At all times Restrictions Weight Bearing Restrictions: No   Therapy/Group: Individual Therapy  Excell Seltzer, PT, DPT  09/12/2018, 12:57 PM

## 2018-09-12 NOTE — Progress Notes (Deleted)
PROCEDURE NOTE  DIAGNOSIS: OA right shoulder, right RTC tendonitis  INTERVENTION:  Major joint injection     After informed consent and preparation of the skin with betadine and isopropyl alcohol, I injected 40mg  of kenalog and 4cc of 1% lidocaine into the right subacromial space via lateral approach. Additionally, aspiration was performed prior to injection. The patient tolerated well, and no complications were encountered. Afterward the area was cleaned and dressed. Post- injection instructions were provided.      Ranelle OysterZachary T. Swartz, MD, [facility]AP[facility] 09/12/2018

## 2018-09-13 ENCOUNTER — Inpatient Hospital Stay (HOSPITAL_COMMUNITY): Payer: Managed Care, Other (non HMO) | Admitting: Occupational Therapy

## 2018-09-13 ENCOUNTER — Inpatient Hospital Stay (HOSPITAL_COMMUNITY): Payer: Managed Care, Other (non HMO)

## 2018-09-13 DIAGNOSIS — I10 Essential (primary) hypertension: Secondary | ICD-10-CM

## 2018-09-13 DIAGNOSIS — E871 Hypo-osmolality and hyponatremia: Secondary | ICD-10-CM

## 2018-09-13 DIAGNOSIS — E119 Type 2 diabetes mellitus without complications: Secondary | ICD-10-CM

## 2018-09-13 NOTE — Plan of Care (Signed)
  Problem: SCI BOWEL ELIMINATION Goal: RH STG MANAGE BOWEL WITH ASSISTANCE Description STG Manage Bowel with Min Assistance.  Outcome: Progressing Goal: RH STG SCI MANAGE BOWEL WITH MEDICATION WITH ASSISTANCE Description STG SCI Manage bowel with medication with min assistance.  Outcome: Progressing Goal: RH STG SCI MANAGE BOWEL PROGRAM W/ASSIST OR AS APPROPRIATE Description STG SCI Manage bowel program w/ min assist or as appropriate.  Outcome: Progressing   Problem: SCI BLADDER ELIMINATION Goal: RH STG MANAGE BLADDER WITH ASSISTANCE Description STG Manage Bladder With Assistance Outcome: Progressing Goal: RH STG MANAGE BLADDER WITH MEDICATION WITH ASSISTANCE Description STG Manage Bladder With Medication With Assistance. Outcome: Progressing Goal: RH STG SCI MANAGE BLADDER PROGRAM W/ASSISTANCE Outcome: Progressing   Problem: RH SKIN INTEGRITY Goal: RH STG SKIN FREE OF INFECTION/BREAKDOWN Outcome: Progressing Goal: RH STG MAINTAIN SKIN INTEGRITY WITH ASSISTANCE Description STG Maintain Skin Integrity With Assistance. Outcome: Progressing Goal: RH STG ABLE TO PERFORM INCISION/WOUND CARE W/ASSISTANCE Description STG Able To Perform Incision/Wound Care With Assistance. Outcome: Progressing   Problem: RH SAFETY Goal: RH STG ADHERE TO SAFETY PRECAUTIONS W/ASSISTANCE/DEVICE Description STG Adhere to Safety Precautions With Assistance/Device. Outcome: Progressing Goal: RH STG DECREASED RISK OF FALL WITH ASSISTANCE Description STG Decreased Risk of Fall With Assistance. Outcome: Progressing   Problem: RH PAIN MANAGEMENT Goal: RH STG PAIN MANAGED AT OR BELOW PT'S PAIN GOAL Outcome: Progressing   Problem: RH KNOWLEDGE DEFICIT SCI Goal: RH STG INCREASE KNOWLEDGE OF SELF CARE AFTER SCI Outcome: Progressing

## 2018-09-13 NOTE — Progress Notes (Addendum)
[place] PHYSICAL MEDICINE & REHABILITATION PROGRESS NOTE   Subjective/Complaints: Patient seen laying in bed this morning.  He states he slept well overnight.  ROS: Denies CP, SOB, N/V/D  Objective:   No results found. No results for input(s): WBC, HGB, HCT, PLT in the last 72 hours. Recent Labs    09/12/18 1040  NA 134*  K 4.0  CL 101  CO2 21*  GLUCOSE 218*  BUN 22  CREATININE 0.78  CALCIUM 8.9    Intake/Output Summary (Last 24 hours) at 09/13/2018 1145 Last data filed at 09/13/2018 0848 Gross per 24 hour  Intake 420 ml  Output 1250 ml  Net -830 ml     Physical Exam: Vital Signs Blood pressure (!) 149/72, pulse 82, temperature 98.2 F (36.8 C), resp. rate 20, height 6' (1.829 m), weight 91.5 kg, SpO2 99 %.  Constitutional: No distress . Vital signs reviewed. HENT: Normocephalic.  Atraumatic. Neck: C-collar in place Eyes: EOMI. No discharge. Cardiovascular: RRR. No JVD. Respiratory: CTA Bilaterally. Normal effort. GI: BS +.  Distended. Musculoskeletal: No edema or tenderness in extremities Neurological.  Alert  Motor: Right upper extremity: 4+/5 prox to distal.  Left upper extremity: 5/5 proximal distal Left lower extremity: 3+/5 proximal distal Right lower extremity: Hip flexion, knee extension 1/5, ankle dorsiflexion 1+/5 Skin: Skin is warm and dry. Surgical site cdi Psychiatric: pleasant     Assessment/Plan: 1. Functional deficits secondary to cervical myelopathy which require 3+ hours per day of interdisciplinary therapy in a comprehensive inpatient rehab setting.  Physiatrist is providing close team supervision and 24 hour management of active medical problems listed below.  Physiatrist and rehab team continue to assess barriers to discharge/monitor patient progress toward functional and medical goals  Care Tool:  Bathing    Body parts bathed by patient: Right arm, Left arm, Chest, Abdomen, Front perineal area, Right upper leg, Left upper  leg, Face   Body parts bathed by helper: Buttocks, Right lower leg, Left lower leg     Bathing assist Assist Level: Moderate Assistance - Patient 50 - 74%     Upper Body Dressing/Undressing Upper body dressing   What is the patient wearing?: Pull over shirt    Upper body assist Assist Level: Moderate Assistance - Patient 50 - 74%    Lower Body Dressing/Undressing Lower body dressing      What is the patient wearing?: Pants     Lower body assist Assist for lower body dressing: Maximal Assistance - Patient 25 - 49%     Toileting Toileting    Toileting assist Assist for toileting: Total Assistance - Patient < 25%     Transfers Chair/bed transfer  Transfers assist  Chair/bed transfer activity did not occur: Safety/medical concerns  Chair/bed transfer assist level: Maximal Assistance - Patient 25 - 49%     Locomotion Ambulation   Ambulation assist   Ambulation activity did not occur: Safety/medical concerns          Walk 10 feet activity   Assist  Walk 10 feet activity did not occur: Safety/medical concerns        Walk 50 feet activity   Assist Walk 50 feet with 2 turns activity did not occur: Safety/medical concerns         Walk 150 feet activity   Assist Walk 150 feet activity did not occur: Safety/medical concerns         Walk 10 feet on uneven surface  activity   Assist Walk 10 feet on uneven  surfaces activity did not occur: Safety/medical concerns         Wheelchair     Assist Will patient use wheelchair at discharge?: Yes Type of Wheelchair: Manual Wheelchair activity did not occur: Safety/medical concerns  Wheelchair assist level: Supervision/Verbal cueing Max wheelchair distance: 150'    Wheelchair 50 feet with 2 turns activity    Assist    Wheelchair 50 feet with 2 turns activity did not occur: Safety/medical concerns   Assist Level: Supervision/Verbal cueing   Wheelchair 150 feet activity      Assist Wheelchair 150 feet activity did not occur: Safety/medical concerns   Assist Level: Supervision/Verbal cueing     Medical Problem List and Plan: 1.  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 Cervical collar at all times  -Continue CIR therapies including PT, OT 2.  DVT Prophylaxis/Anticoagulation: SCDs. Vascular study negative  -  lovenox 30mg  q12   3. Pain Management:  Oxycodone and Valium as needed.  -right ankle pain/swelling. 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 Mood:  Provide emotional support 5. Neuropsych: This patient is capable of making decisions on his own behalf.  -trazodone prn for sleep 6. Skin/Wound Care:  Routine skin checks 7. Fluids/Electrolytes/Nutrition, acute renal insufficiency:   Resolved  -push po 8. Diabetes mellitus. Glucophage 1500 mg daily (held), Actos 15 mg daily, Amaryl 4 mg daily.       CBG (last 3)  Recent Labs    09/10/18 1650 09/10/18 2134 09/11/18 0705  GLUCAP 124* 123* 84       Blood glucose elevated on 12/27, continue to monitor 9. Hypertension.   Lisinopril 40 mg daily on hold  Continue clonidine 0.1 mg twice a day, Norvasc 10 mg daily.   Vitals:   09/12/18 2006 09/13/18 0609  BP: 130/64 (!) 149/72  Pulse: 91 82  Resp: 18 20  Temp: 98.1 F (36.7 C) 98.2 F (36.8 C)  SpO2: 97% 99%   Labile on 12/28 10. Hyperlipidemia. Lipitor 11. Constipation. Laxative assistance 12. Leukocytosis:  Resolved  -ucx + for 100k e coli  -sensitive to keflex---completed 13. Colonic ileus: resolved  -belly soft , non distended    -stools now regular and loose--firming up. Continue to hold senokot-s  -advanced to regular diet  -continue OOB  -potassium normal 14.  Hyponatremia  Sodium 134 on 12/27   Continue to monitor  LOS: 25 days A FACE TO FACE EVALUATION WAS PERFORMED   Brandon Collins  09/13/2018, 11:45 AM

## 2018-09-13 NOTE — Progress Notes (Signed)
Occupational Therapy Session Note  Patient Details  Name: EDSEL SHIVES MRN: [id number] Date of Birth: Jun 21, 1951  Today's Date: 09/13/2018 OT Individual Time: 0930-1040 OT Individual Time Calculation (min): 70 min    Short Term Goals: Week 3:  OT Short Term Goal 1 (Week 3): STGs=LTGs set at [employer] A overall  OT Short Term Goal 1 - Progress (Week 3): Progressing toward goal  Skilled Therapeutic Interventions/Progress Updates:    1;1. Pt received on bedpan. With with BM in bedpan with total A for cleansing buttocks and application of barrier cream. Pt completes rolling with min A for cleansing. Pt supine>sitting with min A and dons pants EOB with min A and VC for threading RLE first. OT dons teds and Pt dons L sock by crossing into figure 4. Pt educated on sock aide use and able to don R sock. Pt slide board transfer EOB>w/c with MOD A and VC for keeping anterior weight shift. Pt declines changing shirt as pt prefers tshirt and does not have one. Pt grooms at sink with set up. Pt propels w/c to tx gym with increased time and supervision. Pt completes game of large magnetic checkers on easel with pts game pieces at head height to work on functional reach/BUE strength/GMC. Pt requires min VC for keeping magnetic side of checker on playing board to keep checkers to stick to board. Exited session with tp returned to room, call light in reach and all needs met Therapy Documentation Precautions:  Precautions Precautions: Cervical, Fall Precaution Comments: Reviewed cervical precautions Required Braces or Orthoses: Cervical Brace Cervical Brace: Hard collar, At all times Restrictions Weight Bearing Restrictions: No General:   Vital Signs: Therapy Vitals Temp: 98.2 F (36.8 C) Pulse Rate: 82 Resp: 20 BP: (!) 149/72 Patient Position (if appropriate): Lying Oxygen Therapy SpO2: 99 % O2 Device: Room Air Pain: Pain Assessment Pain Scale: 0-10 Pain Score: 0-No pain Faces Pain Scale: No  hurt Vision   Perception    Praxis   Exercises:   Other Treatments:     Therapy/Group: Individual Therapy  Tonny Branch 09/13/2018, 10:05 AM

## 2018-09-13 NOTE — Progress Notes (Signed)
Occupational Therapy Session Note  Patient Details  Name: Brandon Collins MRN: [id number] Date of Birth: 1951-04-17  Today's Date: 09/13/2018 OT Individual Time: 1130-1210 OT Individual Time Calculation (min): 40 min   Short Term Goals: Week 4:  OT Short Term Goal 1 (Week 4): STGs=LTGs due to ELOS  Skilled Therapeutic Interventions/Progress Updates:    Pt greeted in w/c with spouse present. Amenable to tx with ADL needs met. He self propelled to dayroom and participated in new-learning activity focusing on bilateral UE coordination, memory, self organization, and problem solving (Scrabble). Pt requiring min vcs for accurate counting of tiles, mod-max vcs for building 3-4 letter words throughout game. Educated pt throughout on pressure relief due to buttocks discomfort (via anterior and lateral leans). At end of session pt self propelled back to room and able to pathfind his way back without vcs. Pt left with spouse and NT for lunch setup.   Therapy Documentation Precautions:  Precautions Precautions: Cervical, Fall Precaution Comments: Reviewed cervical precautions Required Braces or Orthoses: Cervical Brace Cervical Brace: Hard collar, At all times Restrictions Weight Bearing Restrictions: No Pain: In Rt knee. When OT asked if he wanted pain medicine from RN, he declined twice. Provided ice for cryotherapy.    ADL: ADL Eating: Supervision/safety Where Assessed-Eating: Bed level Grooming: Supervision/safety Where Assessed-Grooming: Wheelchair Upper Body Bathing: Supervision/safety Where Assessed-Upper Body Bathing: Other (Comment)(recliner) Lower Body Bathing: Other (comment), Minimal assistance Where Assessed-Lower Body Bathing: Other (Comment)(recliner) Upper Body Dressing: Supervision/safety Where Assessed-Upper Body Dressing: Other (Comment)(recliner) Lower Body Dressing: Moderate cueing Where Assessed-Lower Body Dressing: Other (Comment)(recliner) Toileting: Maximal  assistance Where Assessed-Toileting: Bedside Commode Toilet Transfer: Minimal assistance Toilet Transfer Method: Squat pivot Toilet Transfer Equipment: Geophysical data processor: Not assessed      Therapy/Group: Individual Therapy  Jeanee Fabre A Sakara Lehtinen 09/13/2018, 1:22 PM

## 2018-09-14 ENCOUNTER — Inpatient Hospital Stay (HOSPITAL_COMMUNITY): Payer: Managed Care, Other (non HMO) | Admitting: Occupational Therapy

## 2018-09-14 NOTE — Progress Notes (Signed)
[place] PHYSICAL MEDICINE & REHABILITATION PROGRESS NOTE   Subjective/Complaints: Patient seen laying in bed this morning.  He states he slept well overnight.  He denies complaints.  ROS: Denies CP, SOB, N/V/D  Objective:   No results found. No results for input(s): WBC, HGB, HCT, PLT in the last 72 hours. Recent Labs    09/12/18 1040  NA 134*  K 4.0  CL 101  CO2 21*  GLUCOSE 218*  BUN 22  CREATININE 0.78  CALCIUM 8.9    Intake/Output Summary (Last 24 hours) at 09/14/2018 2031 Last data filed at 09/14/2018 1700 Gross per 24 hour  Intake 700 ml  Output 1425 ml  Net -725 ml     Physical Exam: Vital Signs Blood pressure (!) 146/72, pulse 88, temperature 98.1 F (36.7 C), resp. rate 18, height 6' (1.829 m), weight 88.9 kg, SpO2 97 %.  Constitutional: No distress . Vital signs reviewed. HENT: Normocephalic.  Atraumatic. Neck: C-collar in place Eyes: EOMI. No discharge. Cardiovascular: RRR.  No JVD. Respiratory: CTA bilaterally.  Normal effort. GI: BS +.  Distended. Musculoskeletal: No edema or tenderness in extremities Neurological.  Alert  Motor: Right upper extremity: 4+/5 prox to distal.  Left upper extremity: 5/5 proximal distal Left lower extremity: 3+/5 proximal distal, stable Right lower extremity: Hip flexion, knee extension 2-/5, ankle dorsiflexion 1+/5 Skin: Skin is warm and dry. Surgical site cdi Psychiatric: pleasant     Assessment/Plan: 1. Functional deficits secondary to cervical myelopathy which require 3+ hours per day of interdisciplinary therapy in a comprehensive inpatient rehab setting.  Physiatrist is providing close team supervision and 24 hour management of active medical problems listed below.  Physiatrist and rehab team continue to assess barriers to discharge/monitor patient progress toward functional and medical goals  Care Tool:  Bathing    Body parts bathed by patient: Right arm, Left arm, Chest, Abdomen, Front perineal  area, Right upper leg, Left upper leg, Face, Right lower leg, Left lower leg   Body parts bathed by helper: Buttocks     Bathing assist Assist Level: Minimal Assistance - Patient > 75%     Upper Body Dressing/Undressing Upper body dressing   What is the patient wearing?: Pull over shirt    Upper body assist Assist Level: Supervision/Verbal cueing    Lower Body Dressing/Undressing Lower body dressing      What is the patient wearing?: Pants     Lower body assist Assist for lower body dressing: Maximal Assistance - Patient 25 - 49%     Toileting Toileting    Toileting assist Assist for toileting: Maximal Assistance - Patient 25 - 49%     Transfers Chair/bed transfer  Transfers assist  Chair/bed transfer activity did not occur: Safety/medical concerns  Chair/bed transfer assist level: Maximal Assistance - Patient 25 - 49%     Locomotion Ambulation   Ambulation assist   Ambulation activity did not occur: Safety/medical concerns          Walk 10 feet activity   Assist  Walk 10 feet activity did not occur: Safety/medical concerns        Walk 50 feet activity   Assist Walk 50 feet with 2 turns activity did not occur: Safety/medical concerns         Walk 150 feet activity   Assist Walk 150 feet activity did not occur: Safety/medical concerns         Walk 10 feet on uneven surface  activity   Assist Walk 10 feet  on uneven surfaces activity did not occur: Safety/medical concerns         Wheelchair     Assist Will patient use wheelchair at discharge?: Yes Type of Wheelchair: Manual Wheelchair activity did not occur: Safety/medical concerns  Wheelchair assist level: Supervision/Verbal cueing Max wheelchair distance: 150'    Wheelchair 50 feet with 2 turns activity    Assist    Wheelchair 50 feet with 2 turns activity did not occur: Safety/medical concerns   Assist Level: Supervision/Verbal cueing   Wheelchair 150  feet activity     Assist Wheelchair 150 feet activity did not occur: Safety/medical concerns   Assist Level: Supervision/Verbal cueing     Medical Problem List and Plan: 1.  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 Cervical collar at all times  Continue CIR  2.  DVT Prophylaxis/Anticoagulation: SCDs. Vascular study negative  -  lovenox 30mg  q12   3. Pain Management:  Oxycodone and Valium as needed. 4. Mood:  Provide emotional support 5. Neuropsych: This patient is capable of making decisions on his own behalf.  -trazodone prn for sleep 6. Skin/Wound Care:  Routine skin checks 7. Fluids/Electrolytes/Nutrition, acute renal insufficiency:   Resolved  -push po 8. Diabetes mellitus. Glucophage 1500 mg daily (held), Actos 15 mg daily, Amaryl 4 mg daily.       CBG (last 3)  No results for input(s): GLUCAP in the last 72 hours.     Blood glucose elevated on 12/27, continue to monitor  CBGs ordered 9. Hypertension.   Lisinopril 40 mg daily on hold  Continue clonidine 0.1 mg twice a day, Norvasc 10 mg daily.   Vitals:   09/14/18 1227 09/14/18 2018  BP: (!) 152/77 (!) 146/72  Pulse: 98 88  Resp: 18 18  Temp: 98 F (36.7 C) 98.1 F (36.7 C)  SpO2: 99% 97%   ?  Trending up, will consider resuming lisinopril if persistently elevated. 10. Hyperlipidemia. Lipitor 11. Constipation. Laxative assistance 12. Leukocytosis:  Resolved  -ucx + for 100k e coli  -sensitive to keflex---completed 13. Colonic ileus: resolved  -belly soft , non distended    -stools now regular and loose--firming up. Continue to hold senokot-s  -advanced to regular diet  -continue OOB  -potassium normal 14.  Hyponatremia  Sodium 134 on 12/27   Continue to monitor  LOS: 26 days A FACE TO FACE EVALUATION WAS PERFORMED  Dezerae Freiberger Karis Jubanil Malayna Noori 09/14/2018, 8:31 PM

## 2018-09-14 NOTE — Progress Notes (Signed)
Occupational Therapy Session Note  Patient Details  Name: Brandon OchsCarl S Moncure MRN: [id number] Date of Birth: [birth year]  Today's Date: 09/14/2018 OT Individual Time: [id number] OT Individual Time Calculation (min): 71 min   Short Term Goals: Week 3:  OT Short Term Goal 1 (Week 3): STGs=LTGs set at [employer] A overall  OT Short Term Goal 1 - Progress (Week 3): Progressing toward goal  Skilled Therapeutic Interventions/Progress Updates:    Pt greeted in bed and motivated to complete bathing. Tx focus on UB strengthening, endurance, and adaptive bathing/dressing skills during self care engagement. Pt able to use B bedrails to pull himself into unsupported long sitting. He completed UB self care in this position with reclined rest breaks as needed. Able to maintain upright posture without UE support. He washed both feet utilizing long and circle sitting positions. Rolling Rt>Lt completed with Mod A for perihygiene with pt actively having BM. Placed him on bedpan for a few minutes, and then perihygiene continued. Pt with increased redness on buttocks and RN made aware. He required Max A for donning pants due to fatigue at this point. Able to utilize figure 4 position to don Lt gripper sock with increased time and encouragement. He was even able to pull thigh high Ted stockings up past knees today! At end of session pt was left in bed with spouse [name] present.   Therapy Documentation Precautions:  Precautions Precautions: Cervical, Fall Precaution Comments: Reviewed cervical precautions Required Braces or Orthoses: Cervical Brace Cervical Brace: Hard collar, At all times Restrictions Weight Bearing Restrictions: No Pain: In R LE. Per pt, pain manageable during session with rest breaks  Pain Assessment Pain Scale: 0-10 Pain Score: 0-No pain ADL: ADL Eating: Supervision/safety Where Assessed-Eating: Bed level Grooming: Supervision/safety Where Assessed-Grooming: Wheelchair Upper Body Bathing:  Supervision/safety Where Assessed-Upper Body Bathing: Other (Comment)(recliner) Lower Body Bathing: Other (comment), Minimal assistance Where Assessed-Lower Body Bathing: Other (Comment)(recliner) Upper Body Dressing: Supervision/safety Where Assessed-Upper Body Dressing: Other (Comment)(recliner) Lower Body Dressing: Moderate cueing Where Assessed-Lower Body Dressing: Other (Comment)(recliner) Toileting: Maximal assistance Where Assessed-Toileting: Bedside Commode Toilet Transfer: Minimal assistance Toilet Transfer Method: Squat pivot Toilet Transfer Equipment: Psychiatric nurseBedside commode Walk-In Shower Transfer: Not assessed      Therapy/Group: Individual Therapy  Biagio Snelson A Calisha Tindel 09/14/2018, 4:02 PM

## 2018-09-15 ENCOUNTER — Inpatient Hospital Stay (HOSPITAL_COMMUNITY): Payer: Managed Care, Other (non HMO) | Admitting: Physical Therapy

## 2018-09-15 ENCOUNTER — Inpatient Hospital Stay (HOSPITAL_COMMUNITY): Payer: Managed Care, Other (non HMO) | Admitting: Occupational Therapy

## 2018-09-15 ENCOUNTER — Inpatient Hospital Stay (HOSPITAL_COMMUNITY): Payer: Managed Care, Other (non HMO)

## 2018-09-15 LAB — GLUCOSE, CAPILLARY
Glucose-Capillary: 139 mg/dL — ABNORMAL HIGH (ref 70–99)
Glucose-Capillary: 117 mg/dL — ABNORMAL HIGH (ref 70–99)
Glucose-Capillary: 94 mg/dL (ref 70–99)

## 2018-09-15 NOTE — Progress Notes (Signed)
[facility] PHYSICAL MEDICINE & REHABILITATION PROGRESS NOTE   Subjective/Complaints: No new issues. Is comfortable. Slept well again  ROS: Patient denies fever, rash, sore throat, blurred vision, nausea, vomiting, diarrhea, cough, shortness of breath or chest pain, joint or back pain, headache, or mood change.    Objective:   No results found. No results for input(s): WBC, HGB, HCT, PLT in the last 72 hours. Recent Labs    09/12/18 1040  NA 134*  K 4.0  CL 101  CO2 21*  GLUCOSE 218*  BUN 22  CREATININE 0.78  CALCIUM 8.9    Intake/Output Summary (Last 24 hours) at 09/15/2018 0858 Last data filed at 09/15/2018 0518 Gross per 24 hour  Intake 700 ml  Output 1475 ml  Net -775 ml     Physical Exam: Vital Signs Blood pressure 140/76, pulse 80, temperature 98.6 F (37 C), temperature source Oral, resp. rate 16, height 6' (1.829 m), weight 90.8 kg, SpO2 100 %.  Constitutional: No distress . Vital signs reviewed. HEENT: EOMI, oral membranes moist Neck: supple Cardiovascular: RRR without murmur. No JVD    Respiratory: CTA Bilaterally without wheezes or rales. Normal effort    GI: BS +, non-tender, non-distended  Musculoskeletal: No edema or tenderness in extremities Neurological.  Alert  Motor: Right upper extremity: 4+/5 prox to distal.  Left upper extremity: 5/5 proximal distal Left lower extremity: 3+/5 proximal distal, stable Right lower extremity: Hip flexion, knee extension 2-/5, ankle dorsiflexion 1+/5---motor exam stable Skin: Skin is warm and dry. Surgical site remains cdi Psychiatric: pleasant     Assessment/Plan: 1. Functional deficits secondary to cervical myelopathy which require 3+ hours per day of interdisciplinary therapy in a comprehensive inpatient rehab setting.  Physiatrist is providing close team supervision and 24 hour management of active medical problems listed below.  Physiatrist and rehab team continue to assess barriers to discharge/monitor  patient progress toward functional and medical goals  Care Tool:  Bathing    Body parts bathed by patient: Right arm, Left arm, Chest, Abdomen, Front perineal area, Right upper leg, Left upper leg, Face, Right lower leg, Left lower leg   Body parts bathed by helper: Buttocks     Bathing assist Assist Level: Minimal Assistance - Patient > 75%     Upper Body Dressing/Undressing Upper body dressing   What is the patient wearing?: Pull over shirt    Upper body assist Assist Level: Supervision/Verbal cueing    Lower Body Dressing/Undressing Lower body dressing      What is the patient wearing?: Pants     Lower body assist Assist for lower body dressing: Maximal Assistance - Patient 25 - 49%     Toileting Toileting    Toileting assist Assist for toileting: Maximal Assistance - Patient 25 - 49%     Transfers Chair/bed transfer  Transfers assist  Chair/bed transfer activity did not occur: Safety/medical concerns  Chair/bed transfer assist level: Maximal Assistance - Patient 25 - 49%     Locomotion Ambulation   Ambulation assist   Ambulation activity did not occur: Safety/medical concerns          Walk 10 feet activity   Assist  Walk 10 feet activity did not occur: Safety/medical concerns        Walk 50 feet activity   Assist Walk 50 feet with 2 turns activity did not occur: Safety/medical concerns         Walk 150 feet activity   Assist Walk 150 feet activity did not  occur: Safety/medical concerns         Walk 10 feet on uneven surface  activity   Assist Walk 10 feet on uneven surfaces activity did not occur: Safety/medical concerns         Wheelchair     Assist Will patient use wheelchair at discharge?: Yes Type of Wheelchair: Manual Wheelchair activity did not occur: Safety/medical concerns  Wheelchair assist level: Supervision/Verbal cueing Max wheelchair distance: 150'    Wheelchair 50 feet with 2 turns  activity    Assist    Wheelchair 50 feet with 2 turns activity did not occur: Safety/medical concerns   Assist Level: Supervision/Verbal cueing   Wheelchair 150 feet activity     Assist Wheelchair 150 feet activity did not occur: Safety/medical concerns   Assist Level: Supervision/Verbal cueing     Medical Problem List and Plan: 1.  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 Cervical collar at all times  Continue CIR  2.  DVT Prophylaxis/Anticoagulation: SCDs. Vascular study negative  -  lovenox 30mg  q12   3. Pain Management:  Oxycodone and Valium as needed. 4. Mood:  Provide emotional support 5. Neuropsych: This patient is capable of making decisions on his own behalf.  -trazodone prn for sleep 6. Skin/Wound Care:  Routine skin checks 7. Fluids/Electrolytes/Nutrition, acute renal insufficiency:   Resolved  -encourage po 8. Diabetes mellitus. Glucophage 1500 mg daily (held), Actos 15 mg daily, Amaryl 4 mg daily.       CBG (last 3)  No results for input(s): GLUCAP in the last 72 hours.     Blood glucose elevated on 12/27, continue to monitor  CBGs ordered 9. Hypertension.   Lisinopril 40 mg daily on hold  Continue clonidine 0.1 mg twice a day, Norvasc 10 mg daily.   Vitals:   09/14/18 2018 09/15/18 0514  BP: (!) 146/72 140/76  Pulse: 88 80  Resp: 18 16  Temp: 98.1 F (36.7 C) 98.6 F (37 C)  SpO2: 97% 100%   Borderline:  will consider resuming lisinopril if persistently elevated. 10. Hyperlipidemia. Lipitor 11. Constipation. Laxative assistance 12. Leukocytosis:  Resolved  -ucx + for 100k e coli  -sensitive to keflex---completed 13. Colonic ileus: resolved  -belly soft , non distended    -stools now regular and loose--firming up. Continue to hold senokot-s  -advanced to regular diet  -continue OOB  -potassium normal 14.  Hyponatremia  Sodium 134 on 12/27   Continue to monitor  LOS: 27 days A FACE TO FACE  EVALUATION WAS PERFORMED  Ranelle OysterZachary T Rayshawn Maney 09/15/2018, 8:58 AM

## 2018-09-15 NOTE — Progress Notes (Signed)
Physical Therapy Session Note  Patient Details  Name: Brandon Collins MRN: [id number] Date of Birth: October 26, 1950  Today's Date: 09/15/2018 PT Individual Time: 1445-1530 PT Individual Time Calculation (min): 45 min   Short Term Goals: Week 4:  PT Short Term Goal 1 (Week 4): =LTG due to ELOS  Skilled Therapeutic Interventions/Progress Updates:    Pt received seated in bed, agreeable to PT. No complaints of pain. Supine to sit with min A. Slide board transfer bed to w/c with CGA. Manual w/c propulsion 2 x 150 ft with BUE and Supervision. Slide board transfer w/c to mat table with min A. Sit to/from supine and rolling L/R with min A and cueing for positioning. Slide board transfer back to w/c with CGA. Seated BLE therex x 10 reps, increase in R knee pain with therex. Pt has pitting edema in RLE, set up with elevating leg rest. Ice pack to R knee at end of session for pain relief. Pt left seated in w/c in room with needs in reach, quick release belt and chair alarm in place, wife present.  Therapy Documentation Precautions:  Precautions Precautions: Cervical, Fall Precaution Comments: Reviewed cervical precautions Required Braces or Orthoses: Cervical Brace Cervical Brace: Hard collar, At all times Restrictions Weight Bearing Restrictions: No   Therapy/Group: Individual Therapy  Peter Congoaylor Alyce Inscore, PT, DPT  09/15/2018, 4:06 PM

## 2018-09-15 NOTE — Progress Notes (Signed)
Occupational Therapy Session Note  Patient Details  Name: Brandon Collins MRN: [id number] Date of Birth: May 23, 1951  Today's Date: 09/15/2018 OT Individual Time: 1350-1445 OT Individual Time Calculation (min): 55 min   Short Term Goals: Week 4:  OT Short Term Goal 1 (Week 4): STGs=LTGs due to ELOS  Skilled Therapeutic Interventions/Progress Updates:    Pt greeted in bed, stating "things are moving" and agreeable to get [facility] Mod A lateral scoot<BSC. He was able to lower pants using lateral lean technique. Educated pt on potty-squat position and provided him with makeshift footstool. Pt continent of bowels, and required assist for hygiene when he leaned towards Rt side onto elbow. Pt completed squat stand for OT to reapply brief and elevate pants over hips. Then he completed scoot transfer back to bed and returned to supine. He rolled Rt>Lt with supervision and lowered pants again so OT could make sure pink foam dressing was clean. He was able to elevate pants over hips while rolling with supervision, encouragement, and increased time! He pulled himself into long sitting using B bedrails and engaged in handwashing using sanitizer and lotion application. Also able to doff/don Rt sock today! Spouse Brandon Collins expressed concern about pts change in cognitive status post hospital admission. Educated her on orientation strategies she could implement in room to help him with cognition. Also advised her to push pt in w/c around unit to increase stimulation whenever possible. At end of session pt was left with spouse.   Therapy Documentation Precautions:  Precautions Precautions: Cervical, Fall Precaution Comments: Reviewed cervical precautions Required Braces or Orthoses: Cervical Brace Cervical Brace: Hard collar, At all times Restrictions Weight Bearing Restrictions: No Vital Signs: Therapy Vitals Pulse Rate: 89 Resp: 20 BP: (!) 143/71 Patient Position (if appropriate): Lying Oxygen  Therapy SpO2: 97 % O2 Device: Room Air Pain: Pain Assessment Pain Score: 0-No pain ADL: ADL Eating: Supervision/safety Where Assessed-Eating: Bed level Grooming: Supervision/safety Where Assessed-Grooming: Wheelchair Upper Body Bathing: Supervision/safety Where Assessed-Upper Body Bathing: Other (Comment)(recliner) Lower Body Bathing: Other (comment), Minimal assistance Where Assessed-Lower Body Bathing: Other (Comment)(recliner) Upper Body Dressing: Supervision/safety Where Assessed-Upper Body Dressing: Other (Comment)(recliner) Lower Body Dressing: Moderate cueing Where Assessed-Lower Body Dressing: Other (Comment)(recliner) Toileting: Maximal assistance Where Assessed-Toileting: Bedside Commode Toilet Transfer: Minimal assistance Toilet Transfer Method: Squat pivot Toilet Transfer Equipment: Psychiatric nurseBedside commode Walk-In Shower Transfer: Not assessed      Therapy/Group: Individual Therapy  Brandon Collins 09/15/2018, 4:35 PM

## 2018-09-15 NOTE — NC FL2 (Signed)
[place] MEDICAID FL2 LEVEL OF CARE SCREENING TOOL     IDENTIFICATION  Patient Name: Brandon OchsCarl S Platz Birthdate: 02/05/1951 Sex: male Admission Date (Current Location): 08/19/2018  [facility] [place]   Facility and Address:  [facility], [address], [place], [place] [address]      Provider Number: [id number]  Attending Physician Name and Address:  Ranelle OysterSwartz, Zachary T, MD  Relative Name and Phone Number:       Current Level of Care: Other (Comment)(Acute Inpatient Rehabilitation Unit) Recommended Level of Care: Skilled Nursing Facility Prior Approval Number:    Date Approved/Denied:   PASRR Number: [id number]  Discharge Plan: SNF    Current Diagnoses: Patient Active Problem List   Diagnosis Date Noted  . Hyponatremia   . Pain   . Cervical myelopathy (HCC) 08/19/2018  . Cervical cord compression with myelopathy (HCC)   . Postoperative pain   . Diabetes mellitus type 2 in nonobese (HCC)   . Essential hypertension   . Dyslipidemia   . Drug induced constipation   . Myelopathy (HCC) 08/12/2018    Orientation RESPIRATION BLADDER Height & Weight     Self, Place  Normal Incontinent(I/O caths) Weight: 90.8 kg Height:  6' (182.9 cm)  BEHAVIORAL SYMPTOMS/MOOD NEUROLOGICAL BOWEL NUTRITION STATUS      Incontinent Diet(regular, thin liquids)  AMBULATORY STATUS COMMUNICATION OF NEEDS Skin   Total Care Verbally Surgical wounds                       Personal Care Assistance Level of Assistance  Bathing, Dressing, Total care Bathing Assistance: Maximum assistance   Dressing Assistance: Limited assistance Total Care Assistance: Limited assistance   Functional Limitations Info             SPECIAL CARE FACTORS FREQUENCY  PT (By licensed PT), OT (By licensed OT), Bowel and bladder program     PT Frequency: 5x/wk OT Frequency: 5x/wk Bowel and Bladder Program Frequency: qd          Contractures Contractures Info:  Not present    Additional Factors Info  Code Status, Allergies Code Status Info: Full Allergies Info: NKDA           Current Medications (09/15/2018):  This is the current hospital active medication list Current Facility-Administered Medications  Medication Dose Route Frequency Provider Last Rate Last Dose  . acetaminophen (TYLENOL) tablet 650 mg  650 mg Oral Q4H PRN Charlton Amorngiulli, Daniel J, [name]   650 mg at 09/13/18 [id number]   Or  . acetaminophen (TYLENOL) suppository 650 mg  650 mg Rectal Q4H PRN Angiulli, Mcarthur Rossettianiel J, [name]      . alum & mag hydroxide-simeth (MAALOX/MYLANTA) 200-200-20 MG/5ML suspension 30 mL  30 mL Oral Q6H PRN Angiulli, Mcarthur Rossettianiel J, [name]      . amLODipine (NORVASC) tablet 10 mg  10 mg Oral Daily Charlton Amorngiulli, Daniel J, [name]   10 mg at 09/15/18 0931  . atorvastatin (LIPITOR) tablet 20 mg  20 mg Oral Once per day on Sun Tue Thu Sat Charlton Amorngiulli, Daniel J, [name]   20 mg at 09/14/18 1039  . baclofen (LIORESAL) tablet 10 mg  10 mg Oral TID PRN Ranelle OysterSwartz, Zachary T, MD   10 mg at 09/14/18 2303  . bisacodyl (DULCOLAX) EC tablet 5 mg  5 mg Oral Daily PRN Angiulli, Mcarthur Rossettianiel J, [name]      . cholecalciferol (VITAMIN D3) tablet 1,000 Units  1,000 Units Oral Daily Angiulli,  Mcarthur Rossettianiel J, [name]   1,000 Units at 09/15/18 0930  . cloNIDine (CATAPRES) tablet 0.1 mg  0.1 mg Oral BID AngiulliMcarthur Rossetti, Daniel J, [name]   0.1 mg at 09/15/18 0930  . enoxaparin (LOVENOX) injection 30 mg  30 mg Subcutaneous Q12H Faith RogueSwartz, Zachary T, MD   30 mg at 09/15/18 0930  . feeding supplement (BOOST / RESOURCE BREEZE) liquid 1 Container  1 Container Oral TID PRN Ranelle OysterSwartz, Zachary T, MD      . feeding supplement (ENSURE ENLIVE) (ENSURE ENLIVE) liquid 237 mL  237 mL Oral TID BM Ranelle OysterSwartz, Zachary T, MD   237 mL at 09/15/18 0931  . glimepiride (AMARYL) tablet 4 mg  4 mg Oral Q breakfast Gordy SaversKwiatkowski, Peter F, MD   4 mg at 09/15/18 0930  . megestrol (MEGACE) 400 MG/10ML suspension 400 mg  400 mg Oral BID Ranelle OysterSwartz, Zachary T, MD   400 mg at 09/15/18 0930  .  MUSCLE RUB CREA   Topical PRN Charlton Amorngiulli, Daniel J, [name]   1 application at 09/11/18 1240  . ondansetron (ZOFRAN) tablet 4 mg  4 mg Oral Q6H PRN Charlton Amorngiulli, Daniel J, [name]   4 mg at 08/31/18 1613   Or  . ondansetron (ZOFRAN) injection 4 mg  4 mg Intravenous Q6H PRN Angiulli, Mcarthur Rossettianiel J, [name]      . pantoprazole (PROTONIX) EC tablet 40 mg  40 mg Oral Q supper Charlton Amorngiulli, Daniel J, [name]   40 mg at 09/14/18 1807  . pioglitazone (ACTOS) tablet 15 mg  15 mg Oral Daily Charlton Amorngiulli, Daniel J, [name]   15 mg at 09/15/18 0931  . sodium chloride flush (NS) 0.9 % injection 10-40 mL  10-40 mL Intracatheter PRN Ranelle OysterSwartz, Zachary T, MD   10 mL at 08/25/18 1608  . sorbitol 70 % solution 30 mL  30 mL Oral Daily PRN Charlton Amorngiulli, Daniel J, [name]   30 mL at 09/10/18 2034  . traMADol (ULTRAM) tablet 50 mg  50 mg Oral Q6H PRN Erick ColaceKirsteins, Andrew E, MD   50 mg at 09/14/18 2303  . traZODone (DESYREL) tablet 50 mg  50 mg Oral QHS PRN Ranelle OysterSwartz, Zachary T, MD   50 mg at 09/13/18 2149     Discharge Medications: Please see discharge summary for a list of discharge medications.  Relevant Imaging Results:  Relevant Lab Results:   Additional Information SS# [id number]  Amada JupiterHOYLE, Mahogony Gilchrest, LCSW

## 2018-09-15 NOTE — Progress Notes (Signed)
Speech Language Pathology Daily Session Note  Patient Details  Name: Brandon Collins MRN: [id number] Date of Birth: 03-29-1951  Today's Date: 09/15/2018 SLP Individual Time: 1600-1630 SLP Individual Time Calculation (min): 30 min  Short Term Goals: Week 3: SLP Short Term Goal 1 (Week 3): STG=LTG due to remaining length of stay, awaiting SNF placement  Skilled Therapeutic Interventions:Skilled ST services focused on cognitive skills. SLP facilitated completion of medication management task, pt required max A fade to mod A verbal cues for problem solving, recall and sustained attention to task. Pt demonstrated inconsistent clarity of cognition and stated " this confuses me. Its not like it is at home." SLP provided education and pt agreed that his thinking skills are still "foggy".Pt was left in room with call bell within reach and chair alarm set. SLP reccomends to continue skilled services.      Pain Pain Assessment Pain Score: 0-No pain  Therapy/Group: Individual Therapy  Chalise Pe  [facility] 09/15/2018, 4:34 PM

## 2018-09-15 NOTE — Progress Notes (Signed)
Occupational Therapy Session Note  Patient Details  Name: Brandon Collins MRN: [id number] Date of Birth: 01/21/1951  Today's Date: 09/15/2018 OT Individual Time: 1191-47820915-1012 OT Individual Time Calculation (min): 57 min    Short Term Goals: Week 3:  OT Short Term Goal 1 (Week 3): STGs=LTGs set at [employer]Min A overall  OT Short Term Goal 1 - Progress (Week 3): Progressing toward goal  Skilled Therapeutic Interventions/Progress Updates:    Upon entering the room, pt supine in bed with no c/o pain. Pt agreeable to OT intervention. RN arrived with medications. OT provided total A to don B TED hose. Pt donning pants in bed with max A and min A to roll  L <> R . Supine >sit with mod A to EOB . Static sitting EOB with close supervision for 3 minutes. OT positioning slide board and pt transferring into wheelchair with mod A. OT attempting to orient pt to time with mod cuing and use of external aides.  Pt propelled wheelchair to sink for grooming tasks at supervision level with min cuing for attention. Pt remained in wheelchair with call bell and all needed items within reach upon exiting the room.   Therapy Documentation Precautions:  Precautions Precautions: Cervical, Fall Precaution Comments: Reviewed cervical precautions Required Braces or Orthoses: Cervical Brace Cervical Brace: Hard collar, At all times Restrictions Weight Bearing Restrictions: No Pain: Pain Assessment Pain Scale: 0-10 Pain Score: 0-No pain ADL: ADL Eating: Supervision/safety Where Assessed-Eating: Bed level Grooming: Supervision/safety Where Assessed-Grooming: Wheelchair Upper Body Bathing: Supervision/safety Where Assessed-Upper Body Bathing: Other (Comment)(recliner) Lower Body Bathing: Other (comment), Minimal assistance Where Assessed-Lower Body Bathing: Other (Comment)(recliner) Upper Body Dressing: Supervision/safety Where Assessed-Upper Body Dressing: Other (Comment)(recliner) Lower Body Dressing: Moderate  cueing Where Assessed-Lower Body Dressing: Other (Comment)(recliner) Toileting: Maximal assistance Where Assessed-Toileting: Bedside Commode Toilet Transfer: Minimal assistance Toilet Transfer Method: Squat pivot Toilet Transfer Equipment: Psychiatric nurseBedside commode Walk-In Shower Transfer: Not assessed   Therapy/Group: Individual Therapy  Brandon Collins, Brandon Collins 09/15/2018, 1:03 PM

## 2018-09-16 ENCOUNTER — Inpatient Hospital Stay (HOSPITAL_COMMUNITY): Payer: Managed Care, Other (non HMO) | Admitting: Occupational Therapy

## 2018-09-16 ENCOUNTER — Inpatient Hospital Stay (HOSPITAL_COMMUNITY): Payer: Managed Care, Other (non HMO) | Admitting: Physical Therapy

## 2018-09-16 LAB — GLUCOSE, CAPILLARY
Glucose-Capillary: 79 mg/dL (ref 70–99)
Glucose-Capillary: 120 mg/dL — ABNORMAL HIGH (ref 70–99)

## 2018-09-16 NOTE — Plan of Care (Signed)
  Problem: RH SKIN INTEGRITY Goal: RH STG SKIN FREE OF INFECTION/BREAKDOWN Outcome: Progressing Goal: RH STG MAINTAIN SKIN INTEGRITY WITH ASSISTANCE Description STG Maintain Skin Integrity With min.Assistance.  Outcome: Progressing Goal: RH STG ABLE TO PERFORM INCISION/WOUND CARE W/ASSISTANCE Description STG Able To Perform Incision/Wound Care With min. Assistance.  Outcome: Progressing   Problem: RH SAFETY Goal: RH STG ADHERE TO SAFETY PRECAUTIONS W/ASSISTANCE/DEVICE Description STG Adhere to Safety Precautions With min.Assistance/Device.  Outcome: Progressing Goal: RH STG DECREASED RISK OF FALL WITH ASSISTANCE Description STG Decreased Risk of Fall With min.Assistance.  Outcome: Progressing   Problem: RH PAIN MANAGEMENT Goal: RH STG PAIN MANAGED AT OR BELOW PT'S PAIN GOAL Description Less than 3  Outcome: Progressing   Problem: RH KNOWLEDGE DEFICIT SCI Goal: RH STG INCREASE KNOWLEDGE OF SELF CARE AFTER SCI Outcome: Progressing   Problem: SCI BOWEL ELIMINATION Goal: RH STG MANAGE BOWEL WITH ASSISTANCE Description STG Manage Bowel with Min Assistance.  Outcome: Not Progressing Goal: RH STG SCI MANAGE BOWEL WITH MEDICATION WITH ASSISTANCE Description STG SCI Manage bowel with medication with min assistance.  Outcome: Not Progressing Goal: RH STG SCI MANAGE BOWEL PROGRAM W/ASSIST OR AS APPROPRIATE Description STG SCI Manage bowel program w/ min assist or as appropriate.  Outcome: Not Progressing   Problem: SCI BLADDER ELIMINATION Goal: RH STG MANAGE BLADDER WITH ASSISTANCE Description STG Manage Bladder With min.Assistance  Outcome: Not Progressing Goal: RH STG MANAGE BLADDER WITH MEDICATION WITH ASSISTANCE Description STG Manage Bladder With Medication With min.Assistance.  Outcome: Not Progressing Goal: RH STG SCI MANAGE BLADDER PROGRAM W/ASSISTANCE Outcome: Not Progressing

## 2018-09-16 NOTE — Progress Notes (Signed)
Speech Language Pathology Daily Session Note  Patient Details  Name: Brandon Collins MRN: [id number] Date of Birth: [birth year]  Today's Date: 09/16/2018 SLP Individual Time: [id number] SLP Individual Time Calculation (min): 27 min  Short Term Goals: Week 3: SLP Short Term Goal 1 (Week 3): STG=LTG due to remaining length of stay, awaiting SNF placement  Skilled Therapeutic Interventions:Skilled ST services focused on cognitive skills. SLP facilitated recall of medictaion name, functional and dosage, pt required min A verbal cues with external aid. SLP facilitated verbal problem solving with medication management pt required mod A verbal cues. SLP facilitated safety awareness given picture scenes, pt required min A verbal cues. Pt was left in room with call bell within reach and chair alarm set. SLP reccomends to continue skilled services.      Pain Pain Assessment Pain Score: 0-No pain  Therapy/Group: Individual Therapy  Dorcas Melito  [facility] 09/16/2018, 1:40 PM

## 2018-09-16 NOTE — Progress Notes (Signed)
Physical Therapy Session Note  Patient Details  Name: Brandon OchsCarl S Collins MRN: [id number] Date of Birth: Jan 11, 1951  Today's Date: 09/16/2018 PT Individual Time: 0900-1000; 1500-1530 PT Individual Time Calculation (min): 60 min and 30 min  Short Term Goals: Week 4:  PT Short Term Goal 1 (Week 4): =LTG due to ELOS  Skilled Therapeutic Interventions/Progress Updates:    Session 1: Pt received seated in w/c in room, agreeable to PT. Pt reports no pain initially but with activity has onset of R knee and ankle pain, RN notified and pain medication administered during therapy session. Manual w/c propulsion 2 x 150 ft with BUE and Supervision. Slide board transfer w/c to/from mat table uphill with min A and downhill with CGA. Pt still requires step by step cueing for sequencing of transfer. Sit to/from supine with max A due to increase in RLE pain this date and pt inability to follow cues to assist with transfer as he has in previous sessions. Manual w/c propulsion x 150 ft with BUE and Supervision, increased time needed due to fatigue this date. Pt left seated in w/c in room with needs in reach, quick release belt and chair alarm in place.  Session 2: Pt received seated in w/c in room, agreeable to PT session. Pt reports ongoing pain in R knee, declines any intervention. Pt reports feeling like he is "done" for the day and that he is fatigued. Pt agreeable to bed level therex. Slide board transfer w/c to bed with CGA with increased time and cues for pt to problem solve next step of transfer. Sit to supine mod A for BLE management. Supine BLE therex x 10-15 reps: heel slides, hip abd, BKFO, ankle pumps, SAQ. Pt left seated in bed with needs in reach, bed alarm in place, wife present.  Therapy Documentation Precautions:  Precautions Precautions: Cervical, Fall Precaution Comments: Reviewed cervical precautions Required Braces or Orthoses: Cervical Brace Cervical Brace: Hard collar, At all  times Restrictions Weight Bearing Restrictions: No Pain: Pain Assessment Pain Scale: 0-10 Pain Score: 5  Pain Type: Acute pain Pain Location: Knee Pain Orientation: Right;Left Pain Descriptors / Indicators: Aching Pain Onset: Gradual Pain Intervention(s): Medication (See eMAR)    Therapy/Group: Individual Therapy  Peter Congoaylor Ilijah Doucet, PT, DPT  09/16/2018, 10:57 AM

## 2018-09-16 NOTE — Progress Notes (Signed)
Occupational Therapy Session Note  Patient Details  Name: Geralynn OchsCarl S Kennan MRN: [id number] Date of Birth: Jan 05, 1951  Today's Date: 09/16/2018 OT Individual Time: 1105-1200 and 1400-1445 OT Individual Time Calculation (min): 55 min and 45 min    Short Term Goals: Week 4:  OT Short Term Goal 1 (Week 4): STGs=LTGs due to ELOS  Skilled Therapeutic Interventions/Progress Updates:   Session 1: Upon entering the room, pt seated in wheelchair with wife present in room for observation. Pt agreeable to self care tasks from sink level. Pt performed grooming tasks with set up A to obtain all needed items to brush hair and teeth. Pt performing UB bathing and dressing with min A for sequencing and attention to task. Pt then asking , " Well how do I was my lower half?" Pt has always performed LB self care from bed level for safety and required mod cuing for safety awareness to come to this conclusion as well. Pt verbalized being very uncomfortable in wheelchair and requesting to return to bed. Pt positioned in R side lying position to decrease pressure relief. Pt remained in bed with bed alarm activated and call bell within reach. Wife reports feeling very concerned regarding current cognitive deficits. OT notified RN of concerns.  Session 2:  Upon entering the room, pt seated in wheelchair with no c/o pain and wife present for observation. Pt propelled wheelchair 150' for B UE strengthening and endurance with supervision overall. Skilled OT session with focus on uneven slide board transfers. Pt needing mod cuing for set up and sequencing of wheelchair for transfer. Pt transferred into recliner chair and sofa , similar to home environment, with mod A into furniture going downhill and max - total A for uphill transfer back into wheelchair. Pt unable to lift and clear buttocks from wheelchair wheelchair with transfer. Pt returning to room at end of session with chair alarm activated and call bell within reach.    Therapy Documentation Precautions:  Precautions Precautions: Cervical, Fall Precaution Comments: Reviewed cervical precautions Required Braces or Orthoses: Cervical Brace Cervical Brace: Hard collar, At all times Restrictions Weight Bearing Restrictions: No    Pain: Pain Assessment Pain Scale: 0-10 Pain Score: 5  Pain Type: Acute pain Pain Location: Knee Pain Orientation: Right;Left Pain Descriptors / Indicators: Aching Pain Onset: Gradual Pain Intervention(s): Medication (See eMAR) ADL: ADL Eating: Supervision/safety Where Assessed-Eating: Bed level Grooming: Supervision/safety Where Assessed-Grooming: Wheelchair Upper Body Bathing: Supervision/safety Where Assessed-Upper Body Bathing: Other (Comment)(recliner) Lower Body Bathing: Other (comment), Minimal assistance Where Assessed-Lower Body Bathing: Other (Comment)(recliner) Upper Body Dressing: Supervision/safety Where Assessed-Upper Body Dressing: Other (Comment)(recliner) Lower Body Dressing: Moderate cueing Where Assessed-Lower Body Dressing: Other (Comment)(recliner) Toileting: Maximal assistance Where Assessed-Toileting: Bedside Commode Toilet Transfer: Minimal assistance Toilet Transfer Method: Squat pivot Toilet Transfer Equipment: Psychiatric nurseBedside commode Walk-In Shower Transfer: Not assessed   Therapy/Group: Individual Therapy  [name], Malek Skog P 09/16/2018, 12:51 PM

## 2018-09-16 NOTE — Progress Notes (Signed)
[place] PHYSICAL MEDICINE & REHABILITATION PROGRESS NOTE   Subjective/Complaints: No new complaints. Has some right knee and back pain but manageable.   ROS: Patient denies fever, rash, sore throat, blurred vision, nausea, vomiting, diarrhea, cough, shortness of breath or chest pain,   headache, or mood change.   Objective:   No results found. No results for input(s): WBC, HGB, HCT, PLT in the last 72 hours. No results for input(s): NA, K, CL, CO2, GLUCOSE, BUN, CREATININE, CALCIUM in the last 72 hours.  Intake/Output Summary (Last 24 hours) at 09/16/2018 0901 Last data filed at 09/16/2018 0811 Gross per 24 hour  Intake 480 ml  Output 1375 ml  Net -895 ml     Physical Exam: Vital Signs Blood pressure 134/78, pulse 84, temperature 98 F (36.7 C), temperature source Oral, resp. rate 19, height 6' (1.829 m), weight 92.3 kg, SpO2 100 %.  Constitutional: No distress . Vital signs reviewed. HEENT: EOMI, oral membranes moist Neck: supple Cardiovascular: RRR without murmur. No JVD    Respiratory: CTA Bilaterally without wheezes or rales. Normal effort    GI: BS +, non-tender, non-distended  Musculoskeletal: No edema or tenderness in extremities Neurological.  Alert  Motor: Right upper extremity: 4+/5 prox to distal.  Left upper extremity: 5/5 proximal distal Left lower extremity: 3+/5 proximal distal, stable Right lower extremity: Hip flexion, knee extension 2-/5, ankle dorsiflexion 1+ to 2-/5--- Skin: Skin is warm and dry. Surgical site remains cdi Psychiatric: pleasant     Assessment/Plan: 1. Functional deficits secondary to cervical myelopathy which require 3+ hours per day of interdisciplinary therapy in a comprehensive inpatient rehab setting.  Physiatrist is providing close team supervision and 24 hour management of active medical problems listed below.  Physiatrist and rehab team continue to assess barriers to discharge/monitor patient progress toward functional and  medical goals  Care Tool:  Bathing    Body parts bathed by patient: Right arm, Left arm, Chest, Abdomen, Front perineal area, Right upper leg, Left upper leg, Face, Right lower leg, Left lower leg   Body parts bathed by helper: Buttocks     Bathing assist Assist Level: Minimal Assistance - Patient > 75%     Upper Body Dressing/Undressing Upper body dressing   What is the patient wearing?: Pull over shirt    Upper body assist Assist Level: Supervision/Verbal cueing    Lower Body Dressing/Undressing Lower body dressing      What is the patient wearing?: Pants     Lower body assist Assist for lower body dressing: Maximal Assistance - Patient 25 - 49%     Toileting Toileting    Toileting assist Assist for toileting: Maximal Assistance - Patient 25 - 49%     Transfers Chair/bed transfer  Transfers assist  Chair/bed transfer activity did not occur: Safety/medical concerns  Chair/bed transfer assist level: Minimal Assistance - Patient > 75%     Locomotion Ambulation   Ambulation assist   Ambulation activity did not occur: Safety/medical concerns          Walk 10 feet activity   Assist  Walk 10 feet activity did not occur: Safety/medical concerns        Walk 50 feet activity   Assist Walk 50 feet with 2 turns activity did not occur: Safety/medical concerns         Walk 150 feet activity   Assist Walk 150 feet activity did not occur: Safety/medical concerns         Walk 10 feet on  uneven surface  activity   Assist Walk 10 feet on uneven surfaces activity did not occur: Safety/medical concerns         Wheelchair     Assist Will patient use wheelchair at discharge?: Yes Type of Wheelchair: Manual Wheelchair activity did not occur: Safety/medical concerns  Wheelchair assist level: Supervision/Verbal cueing Max wheelchair distance: 150'    Wheelchair 50 feet with 2 turns activity    Assist    Wheelchair 50 feet with  2 turns activity did not occur: Safety/medical concerns   Assist Level: Supervision/Verbal cueing   Wheelchair 150 feet activity     Assist Wheelchair 150 feet activity did not occur: Safety/medical concerns   Assist Level: Supervision/Verbal cueing     Medical Problem List and Plan: 1.  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 Cervical collar at all times  Continue CIR, team conf  -SNF pending 2.  DVT Prophylaxis/Anticoagulation: SCDs. Vascular study negative  -  lovenox 30mg  q12   3. Pain Management:  Oxycodone and Valium as needed. 4. Mood:  Provide emotional support 5. Neuropsych: This patient is capable of making decisions on his own behalf.  -trazodone prn for sleep 6. Skin/Wound Care:  Routine skin checks 7. Fluids/Electrolytes/Nutrition, acute renal insufficiency:   Resolved  -encourage po 8. Diabetes mellitus. Glucophage 1500 mg daily (held), Actos 15 mg daily, Amaryl 4 mg daily.       CBG (last 3)  Recent Labs    09/15/18 1208 09/15/18 2202 09/16/18 0656  GLUCAP 117* 94 79       cbg's qam only 9. Hypertension.   Lisinopril 40 mg daily on hold  Continue clonidine 0.1 mg twice a day, Norvasc 10 mg daily.   Vitals:   09/16/18 0352 09/16/18 0758  BP: (!) 105/93 134/78  Pulse: 74 84  Resp: 19   Temp: 98 F (36.7 C)   SpO2: 100%    Borderline to improved:  will consider resuming lisinopril if persistently elevated. 10. Hyperlipidemia. Lipitor 11. Constipation. Laxative assistance 12. Leukocytosis:  Resolved  -ucx + for 100k e coli  -sensitive to keflex---completed 13. Colonic ileus: resolved  -belly soft , non distended    -stools now regular and loose--firming up. Continue to hold senokot-s  -advanced to regular diet  -continue OOB  -potassium normal 14.  Hyponatremia  Sodium 134 on 12/27   Continue to monitor  LOS: 28 days A FACE TO FACE EVALUATION WAS PERFORMED  Brandon Collins 09/16/2018, 9:01  AM

## 2018-09-17 ENCOUNTER — Inpatient Hospital Stay (HOSPITAL_COMMUNITY): Payer: Managed Care, Other (non HMO) | Admitting: Physical Therapy

## 2018-09-17 ENCOUNTER — Inpatient Hospital Stay (HOSPITAL_COMMUNITY): Payer: Managed Care, Other (non HMO) | Admitting: Occupational Therapy

## 2018-09-17 LAB — GLUCOSE, CAPILLARY
Glucose-Capillary: 87 mg/dL (ref 70–99)
Glucose-Capillary: 162 mg/dL — ABNORMAL HIGH (ref 70–99)

## 2018-09-17 MED ORDER — SENNOSIDES-DOCUSATE SODIUM 8.6-50 MG PO TABS: 1.0000 | ORAL_TABLET | Freq: Every day | ORAL | Status: DC

## 2018-09-17 MED ORDER — SENNOSIDES-DOCUSATE SODIUM 8.6-50 MG PO TABS
1.0000 | ORAL_TABLET | Freq: Every day | ORAL | Status: DC
  Administered 2018-09-17: 1 via ORAL
  Filled 2018-09-17: qty 1

## 2018-09-17 NOTE — Progress Notes (Signed)
[place] PHYSICAL MEDICINE & REHABILITATION PROGRESS NOTE   Subjective/Complaints: No new problems. In good spirits. Pain controlled  ROS: Patient denies fever, rash, sore throat, blurred vision, nausea, vomiting, diarrhea, cough, shortness of breath or chest pain, joint or back pain, headache, or mood change.    Objective:   No results found. No results for input(s): WBC, HGB, HCT, PLT in the last 72 hours. No results for input(s): NA, K, CL, CO2, GLUCOSE, BUN, CREATININE, CALCIUM in the last 72 hours.  Intake/Output Summary (Last 24 hours) at 09/17/2018 1035 Last data filed at 09/17/2018 0750 Gross per 24 hour  Intake 600 ml  Output 1875 ml  Net -1275 ml     Physical Exam: Vital Signs Blood pressure 140/72, pulse 82, temperature (!) 97.2 F (36.2 C), temperature source Oral, resp. rate 18, height 6' (1.829 m), weight 88.3 kg, SpO2 100 %.  Constitutional: No distress . Vital signs reviewed. HEENT: EOMI, oral membranes moist Neck: supple Cardiovascular: RRR without murmur. No JVD    Respiratory: CTA Bilaterally without wheezes or rales. Normal effort    GI: BS +, non-tender, non-distended  Musculoskeletal: No edema or tenderness in extremities Neurological.  Alert  Motor: Right upper extremity: 4+/5 prox to distal.  Left upper extremity: 5/5 proximal distal Left lower extremity: 3+/5 proximal distal, stable Right lower extremity: Hip flexion, knee extension 2-/5, ankle dorsiflexion 1+ to 2-/5---    Skin is warm and dry. Surgical site intact Psychiatric: cooperative   Assessment/Plan: 1. Functional deficits secondary to cervical myelopathy which require 3+ hours per day of interdisciplinary therapy in a comprehensive inpatient rehab setting.  Physiatrist is providing close team supervision and 24 hour management of active medical problems listed below.  Physiatrist and rehab team continue to assess barriers to discharge/monitor patient progress toward functional and  medical goals  Care Tool:  Bathing    Body parts bathed by patient: Right arm, Left arm, Chest, Abdomen   Body parts bathed by helper: Buttocks     Bathing assist Assist Level: Supervision/Verbal cueing     Upper Body Dressing/Undressing Upper body dressing   What is the patient wearing?: Pull over shirt    Upper body assist Assist Level: Set up assist    Lower Body Dressing/Undressing Lower body dressing      What is the patient wearing?: Pants     Lower body assist Assist for lower body dressing: Maximal Assistance - Patient 25 - 49%     Toileting Toileting    Toileting assist Assist for toileting: Maximal Assistance - Patient 25 - 49%     Transfers Chair/bed transfer  Transfers assist  Chair/bed transfer activity did not occur: Safety/medical concerns  Chair/bed transfer assist level: Minimal Assistance - Patient > 75%     Locomotion Ambulation   Ambulation assist   Ambulation activity did not occur: Safety/medical concerns          Walk 10 feet activity   Assist  Walk 10 feet activity did not occur: Safety/medical concerns        Walk 50 feet activity   Assist Walk 50 feet with 2 turns activity did not occur: Safety/medical concerns         Walk 150 feet activity   Assist Walk 150 feet activity did not occur: Safety/medical concerns         Walk 10 feet on uneven surface  activity   Assist Walk 10 feet on uneven surfaces activity did not occur: Safety/medical concerns  Wheelchair     Assist Will patient use wheelchair at discharge?: Yes Type of Wheelchair: Manual Wheelchair activity did not occur: Safety/medical concerns  Wheelchair assist level: Supervision/Verbal cueing Max wheelchair distance: 150'    Wheelchair 50 feet with 2 turns activity    Assist    Wheelchair 50 feet with 2 turns activity did not occur: Safety/medical concerns   Assist Level: Supervision/Verbal cueing    Wheelchair 150 feet activity     Assist Wheelchair 150 feet activity did not occur: Safety/medical concerns   Assist Level: Supervision/Verbal cueing     Medical Problem List and Plan: 1.  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 Cervical collar at all times  -Continue CIR therapies including PT, OT   -SNF pending 2.  DVT Prophylaxis/Anticoagulation: SCDs. Vascular study negative  -  lovenox 30mg  q12   3. Pain Management:  Oxycodone and Valium as needed. 4. Mood:  Provide emotional support 5. Neuropsych: This patient is capable of making decisions on his own behalf.  -trazodone prn for sleep 6. Skin/Wound Care:  Routine skin checks 7. Fluids/Electrolytes/Nutrition, acute renal insufficiency:   Resolved  -encourage po 8. Diabetes mellitus. Glucophage 1500 mg daily (held), Actos 15 mg daily, Amaryl 4 mg daily.       CBG (last 3)  Recent Labs    09/16/18 0656 09/16/18 2116 09/17/18 0631  GLUCAP 79 120* 87       cbg's qam only 9. Hypertension.   Lisinopril 40 mg daily on hold  Continue clonidine 0.1 mg twice a day, Norvasc 10 mg daily.   Vitals:   09/16/18 2017 09/17/18 0441  BP: 137/67 140/72  Pulse: 81 82  Resp: 18 18  Temp: 98.5 F (36.9 C) (!) 97.2 F (36.2 C)  SpO2: 98% 100%   Borderline to improved:  will consider resuming lisinopril if persistently elevated. 10. Hyperlipidemia. Lipitor 11. Constipation. Laxative assistance 12. Leukocytosis:  Resolved  -ucx + for 100k e coli  -sensitive to keflex---completed 13. Colonic ileus: resolved  -belly soft , non distended    -stools now regular but hard, resume senna-s  -advanced to regular diet  -continue OOB  -potassium normal 14.  Hyponatremia  Sodium 134 on 12/27   Continue to monitor  LOS: 29 days A FACE TO FACE EVALUATION WAS PERFORMED  Ranelle Oyster 09/17/2018, 10:35 AM

## 2018-09-17 NOTE — Progress Notes (Addendum)
Occupational Therapy Session Note  Patient Details  Name: [name] MRN: [id number] Date of Birth: Jun 02, 1951  Today's Date: 09/17/2018 OT Individual Time: 1100-1200 OT Individual Time Calculation (min): 60 min   Skilled Therapeutic Interventions/Progress Updates: patient participated in the following this session:   Patient participated in bilateral UE motor control at shoulder flexion greater than 110 degrees; bilateral UE strengthening and fine motor coordination and strengthening  Patient state he was fatigued to stand to provide great pressure relief to painful right hip  (RN made aware of c/o right hip discomfort)  Patient left with wife seated in w/c with alarm belt engaged at the end of the session  Therapy Documentation Precautions:  Precautions Precautions: Cervical, Fall Precaution Comments: Reviewed cervical precautions Required Braces or Orthoses: Cervical Brace Cervical Brace: Hard collar, At all times Restrictions Weight Bearing Restrictions: No   Pain:left hip pain.  Patient did not rate but stated, "It is a bit irritation more than anything."   nursing was informed of patient discomfort/complaine  Therapy/Group: Individual Therapy  Bud Face [facility] 09/17/2018, 7:44 PM

## 2018-09-17 NOTE — Progress Notes (Signed)
Physical Therapy Session Note  Patient Details  Name: Brandon Collins MRN: [id number] Date of Birth: 1950/12/07  Today's Date: 09/17/2018 PT Individual Time: 9798-9211; 1330-1430 PT Individual Time Calculation (min): 75 min and 60 min  Short Term Goals: Week 4:  PT Short Term Goal 1 (Week 4): =LTG due to ELOS  Skilled Therapeutic Interventions/Progress Updates:    Session 1: Pt received seated in bed, agreeable to PT. No complaints of pain at rest and reports he just received pain medication from RN. Rolling L/R with min A and use of bedrails for max A donning of brief and pants. Supine to sit with mod A with HOB elevated. Slide board transfer bed to w/c with min A, multimodal cueing for head/hips relationship during transfer. Setup A to brush teeth at sink while seated in w/c. Manual w/c propulsion x 150 ft with BUE and Supervision. Slide board transfer w/c to uphill mat table with min A. Sit to supine mod A for BLE management. Attempt to have pt perform supine bridges, pt is able to elicit glute contraction but unable to lift hips from mat table. Supine to sit with max A due to pain in RLE and need for assist with BLE and trunk during transfer. Slide board transfer mat table to w/c with CGA going downhill. Attempt to have pt perform seated w/c pushups in chair, pt unable to clear buttocks from chair due to pain in R knee with WBing. UBE x 5 min going forwards at level 5 for BUE strengthening. Pt left seated in w/c in room with needs in reach, quick release belt and chair alarm in place. Pt declines ice pack to R knee for pain relief.  Session 2: Pt received seated in w/c in room, agreeable to PT. Pt reports ongoing R knee pain and onset of low back pain this PM, declines intervention. Manual w/c propulsion x 150 ft with BUE and Supervision. Slide board transfer w/c to elevated mat table with CGA. Seated core therex while EOM with 2 kg weighted ball: punch-outs and L/R diagonals 2 x 15 reps each. Seated  lateral leans L/R x 15 reps each to mat table with return to midline, CGA for sitting balance. Seated reaching activity with alt UE reaching outside BOS and across midline with manual cues for WBing through BLE, close SBA for sitting balance. Seated BLE therex x 10 reps: marches, LAQ with max encouragement and AAROM for RLE due to ongoing pain with knee ROM. Pt requests to lay back down after therapy session due to back pain. Slide board transfer with CGA. Sit to supine mod A for BLE management. Pt left seated in bed with needs in reach, bed alarm in place, wife present.   Therapy Documentation Precautions:  Precautions Precautions: Cervical, Fall Precaution Comments: Reviewed cervical precautions Required Braces or Orthoses: Cervical Brace Cervical Brace: Hard collar, At all times Restrictions Weight Bearing Restrictions: No   Therapy/Group: Individual Therapy  Peter Congo, PT, DPT  09/17/2018, 10:22 AM

## 2018-09-17 NOTE — Plan of Care (Signed)
  Problem: SCI BOWEL ELIMINATION Goal: RH STG MANAGE BOWEL WITH ASSISTANCE Description STG Manage Bowel with Min Assistance.  Outcome: Progressing Goal: RH STG SCI MANAGE BOWEL WITH MEDICATION WITH ASSISTANCE Description STG SCI Manage bowel with medication with min assistance.  Outcome: Progressing Goal: RH STG SCI MANAGE BOWEL PROGRAM W/ASSIST OR AS APPROPRIATE Description STG SCI Manage bowel program w/ min assist or as appropriate.  Outcome: Progressing   Problem: SCI BLADDER ELIMINATION Goal: RH STG MANAGE BLADDER WITH ASSISTANCE Description STG Manage Bladder With min.Assistance  Outcome: Progressing Goal: RH STG MANAGE BLADDER WITH MEDICATION WITH ASSISTANCE Description STG Manage Bladder With Medication With min.Assistance.  Outcome: Progressing Goal: RH STG SCI MANAGE BLADDER PROGRAM W/ASSISTANCE Outcome: Progressing   Problem: RH SKIN INTEGRITY Goal: RH STG SKIN FREE OF INFECTION/BREAKDOWN Outcome: Progressing Goal: RH STG MAINTAIN SKIN INTEGRITY WITH ASSISTANCE Description STG Maintain Skin Integrity With min.Assistance.  Outcome: Progressing Goal: RH STG ABLE TO PERFORM INCISION/WOUND CARE W/ASSISTANCE Description STG Able To Perform Incision/Wound Care With min. Assistance.  Outcome: Progressing   Problem: RH SAFETY Goal: RH STG ADHERE TO SAFETY PRECAUTIONS W/ASSISTANCE/DEVICE Description STG Adhere to Safety Precautions With min.Assistance/Device.  Outcome: Progressing Goal: RH STG DECREASED RISK OF FALL WITH ASSISTANCE Description STG Decreased Risk of Fall With min.Assistance.  Outcome: Progressing   Problem: RH PAIN MANAGEMENT Goal: RH STG PAIN MANAGED AT OR BELOW PT'S PAIN GOAL Description Less than 3  Outcome: Progressing   Problem: RH KNOWLEDGE DEFICIT SCI Goal: RH STG INCREASE KNOWLEDGE OF SELF CARE AFTER SCI Outcome: Progressing

## 2018-09-18 ENCOUNTER — Inpatient Hospital Stay (HOSPITAL_COMMUNITY): Payer: Managed Care, Other (non HMO)

## 2018-09-18 ENCOUNTER — Inpatient Hospital Stay (HOSPITAL_COMMUNITY): Payer: Managed Care, Other (non HMO) | Admitting: Physical Therapy

## 2018-09-18 ENCOUNTER — Inpatient Hospital Stay (HOSPITAL_COMMUNITY): Payer: Managed Care, Other (non HMO) | Admitting: Occupational Therapy

## 2018-09-18 ENCOUNTER — Inpatient Hospital Stay (HOSPITAL_COMMUNITY): Payer: Managed Care, Other (non HMO) | Admitting: Speech Pathology

## 2018-09-18 LAB — GLUCOSE, CAPILLARY: Glucose-Capillary: 88 mg/dL (ref 70–99)

## 2018-09-18 MED ORDER — ACETAMINOPHEN 325 MG PO TABS: 650.0000 mg | ORAL_TABLET | ORAL | Status: DC | PRN

## 2018-09-18 MED ORDER — BACLOFEN 10 MG PO TABS: 10.0000 mg | ORAL_TABLET | Freq: Three times a day (TID) | ORAL | 0 refills | Status: DC | PRN

## 2018-09-18 MED ORDER — GLIMEPIRIDE 4 MG PO TABS: 4.0000 mg | ORAL_TABLET | Freq: Every day | ORAL | Status: DC

## 2018-09-18 MED ORDER — SENNOSIDES-DOCUSATE SODIUM 8.6-50 MG PO TABS: 1.0000 | ORAL_TABLET | Freq: Every day | ORAL | Status: DC

## 2018-09-18 MED ORDER — PANTOPRAZOLE SODIUM 40 MG PO TBEC: 40.0000 mg | DELAYED_RELEASE_TABLET | Freq: Every day | ORAL | Status: DC

## 2018-09-18 MED ORDER — VITAMIN D 1000 UNITS PO TABS: 1000.0000 [IU] | ORAL_TABLET | Freq: Every day | ORAL | Status: AC

## 2018-09-18 MED ORDER — TRAMADOL HCL 50 MG PO TABS: 50.0000 mg | ORAL_TABLET | Freq: Four times a day (QID) | ORAL | 0 refills | Status: DC | PRN

## 2018-09-18 NOTE — Progress Notes (Signed)
Speech Language Pathology Discharge Summary  Patient Details  Name: Brandon Collins MRN: [id number] Date of Birth: 1951-01-27  Today's Date: 09/18/2018 SLP Individual Time: 0815-0900 SLP Individual Time Calculation (min): 45 min   Skilled Therapeutic Interventions:  Skilled treatment session focused on cognition goals. SLP facilitated session by providing Min A cues for recall of general information facilitated by use of memory notebook. SLP provided education regarding need for further rehab at SNF d/t pt with no awareness of deficits and how they impact independence within home setting. Pt continues to exhibit mild throat clears (very subtle) after swallow but has not demonstrated any respiratory compromise. Pt demonstrates little mental flexibility which impedes safety and comprehension of strategies for safety (such as wearing fall risk bracelet).     Patient has met 5 of 5 long term goals.  Patient to discharge at [facility] level.    Clinical Impression/Discharge Summary:   Pt has made good progress in skill ST sessions and as a result he is discharging at [place] [facility] for cognition specifically with complex problem solving (such as medication management) and emergent/anticipatory awareness. Pt continues to be safe on regular diet with thin liquids. Continue to recommend follow up skilled ST to address above mentioned deficits to improve independence and safety at discharge.    Care Partner:  Caregiver Able to Provide Assistance: No  Type of Caregiver Assistance: Physical;Cognitive  Recommendation:  Skilled Nursing facility  Rationale for SLP Follow Up: Maximize cognitive function and independence;Reduce caregiver burden    Reasons for discharge: Treatment goals met;Discharged from hospital   Patient/Family Agrees with Progress Made and Goals Achieved: Yes    Truc Winfree 09/18/2018, 8:29 AM

## 2018-09-18 NOTE — Discharge Summary (Signed)
Discharge summary job [phone]

## 2018-09-18 NOTE — Discharge Summary (Signed)
NAME: Brandon Collins, Brandon Collins MEDICAL RECORD ZM:[id number] ACCOUNT [id number] DATE OF BIRTH:May 13, 1951 FACILITY: [facility] LOCATION: [facility] PHYSICIAN:ZACHARY SWARTZ, MD  DISCHARGE SUMMARY  DATE OF DISCHARGE:  09/18/2018  DISCHARGE DIAGNOSES: 1.  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   2.  Subcutaneous Lovenox for deep venous thrombosis prophylaxis. 3.  Pain management.   4.  Diabetes mellitus. 5.  Hypertension 6.  Hyperlipidemia. 7.  Constipation.  8.  Colonic ileus.   9.  Hyponatremia, resolved.  HISTORY OF PRESENT ILLNESS:  This is a 68 year old right-handed male with history of hypertension, diabetes mellitus, cervical myelopathy with cord compression C3 through C7 with progressive decline over the past 6 months.  He lives with spouse, was  independent, working up until 2 weeks prior.  Presented 08/12/2018 with new onset of bilateral upper extremity weakness, left lower extremity weakness, some mild balance deficits with noted falls.  Underwent anterior cervical decompression and fusion  C3-4, C4-5, C5-6 and C6-7 as well as placement of anterior instrumentation per Dr. Yevette Edwards.  HOSPITAL COURSE:  Pain management.  Cervical collar at all times.  Therapy evaluation completed and the patient was admitted for a comprehensive rehab program.  PAST MEDICAL HISTORY:  See discharge diagnoses.  SOCIAL HISTORY:  Lives with spouse, was independent up until 2 weeks prior to surgery.  FUNCTIONAL STATUS:  Upon admission to rehab services was max assist sit to stand, max assist supine to sit, max total assist with ADLs.  PHYSICAL EXAMINATION: VITAL SIGNS:  Blood pressure 152/79, pulse 87, temperature 99, respirations 18. GENERAL:  Alert male, in no acute distress.  Cervical collar in place. HEENT:  EOMs intact. CARDIOVASCULAR:  Rate controlled. LUNGS:  Clear to auscultation. ABDOMEN:  Mildly distended with  positive bowel sounds.  REHABILITATION HOSPITAL COURSE:  The patient was admitted to inpatient rehab services.  Therapies initiated on a 3-hour daily basis, consisting of physical therapy, occupational therapy, speech therapy and rehab nursing.  The following issues were  addressed during patient's rehabilitation stay.  Pertaining to the patient's cervical myelopathy quadriparesis, status post ACDF C3 through C7 on 08/12/2018, cervical collar at all times.  He would follow up with Dr. Yevette Edwards.  DVT prophylaxis with  subcutaneous Lovenox.  Vascular studies negative.  Pain management with the use of oxycodone later changed to tramadol; was also using baclofen as needed.  Blood pressure is controlled on Norvasc as well as clonidine.  Hospital course complicated by  colonic ileus.  Nasogastric tube initially in place.  Diet slowly advanced as ileus resolved with followup KUBs.  He was now tolerating a regular diet.  Blood sugars controlled, monitored.  He continued on Amaryl as well as Actos.  The patient received  weekly collaborative interdisciplinary team conferences to discuss estimated length of stay, family teaching, any barriers to discharge.  He rolls left to right with minimal assist and use of bed rail with max assist to don his pants, supine to sit with  moderate assist, sliding board transfers to the bed, wheelchair with minimal assist.  He can propel his wheelchair 150 feet supervision sliding board transfers, wheelchair to uphill mat, minimal assist.  Multiple attempts to perform seated wheelchair  pushups in chair.  Activities of daily living and homemaking.  The patient personally participated in bilateral upper extremity motor control shoulder flexion activities.  Again, needing min mod assist for ADLs.  Due to ongoing physical needs, wife did  not feel she could provide the  necessary assistance at home.  Plans for skilled nursing facility.  DISCHARGE MEDICATIONS:  Included Norvasc 10 mg  p.o. daily, Lipitor 20 mg 4 times weekly, vitamin D 1000 units p.o. daily, clonidine 0.1 mg p.o. b.i.d., Amaryl 4 mg p.o. daily, Protonix 40 mg p.o. b.i.d.  He has been with Protonix 40 mg p.o. daily, Actos  15 mg p.o. daily, Senokot-S 1 tablet p.o. at bedtime, baclofen 10 mg p.o. t.i.d. as needed spasms, tramadol 50 mg p.o. every 6 hours as needed for pain.  DIET:  Regular consistency 1800 calorie ADA diet.  The patient would follow up with Dr. Faith RogueZachary Swartz at the outpatient rehab service office as directed; Dr. Estill BambergMark Dumonski, call for appointment.  SPECIAL INSTRUCTIONS:  Cervical collar at all times.  JN/NUANCE D:09/18/2018 T:09/18/2018 JOB:[id number]

## 2018-09-18 NOTE — Progress Notes (Signed)
Occupational Therapy Discharge Summary  Patient Details  Name: Brandon Collins MRN: [id number] Date of Birth: 1951-07-17  Today's Date: 09/18/2018 OT Individual Time: 1100-1138 OT Individual Time Calculation (min): 38 min    Patient has met 5 of 8 long term goals due to improved activity tolerance, improved balance and ability to compensate for deficits.  Patient to discharge at overall S- max A level.  Min A slide board transfers. Reasons goals not met: goals not met for LB dressing, toileting, and bathing as pt needing increased assistance for safety and due to deficits  Recommendation:  Patient will benefit from ongoing skilled OT services in skilled nursing facility setting to continue to advance functional skills in the area of BADL and Reduce care partner burden.  Equipment: defer to next venue of care  Reasons for discharge: lack of progress toward goals and discharge from hospital  Patient/family agrees with progress made and goals achieved: Yes   OT Intervention: Pt transitioned from PT session without difficulty but very fatigued. Pt requesting to transfer into recliner chair with min A slide board transfer and min cuing for set up. Caregiver present with several questions regarding expectations for SNF rehab stay. OT answering questions until caregiver satisfied and with no further questions at this time. Pt required min multimodal day for orientation questions this session and to verbalize what he had done in prior sessions. Pt continues to report fatigue at this point and remains seated in recliner chair with call bell and all needed items within reach.   OT Discharge Precautions/Restrictions  Precautions Precautions: Cervical;Fall Required Braces or Orthoses: Cervical Brace Cervical Brace: Hard collar;At all times Restrictions Weight Bearing Restrictions: No General   Vital Signs  Pain Pain Assessment Pain Scale: Faces Faces Pain Scale: Hurts little more Pain Type:  Acute pain Pain Location: Leg Pain Orientation: Right Pain Descriptors / Indicators: Discomfort Pain Onset: Gradual ADL ADL Eating: Supervision/safety Where Assessed-Eating: Bed level Grooming: Supervision/safety Where Assessed-Grooming: Wheelchair Upper Body Bathing: Supervision/safety Where Assessed-Upper Body Bathing: Other (Comment)(recliner) Lower Body Bathing: Other (comment), Minimal assistance Where Assessed-Lower Body Bathing: Other (Comment)(recliner) Upper Body Dressing: Supervision/safety Where Assessed-Upper Body Dressing: Other (Comment)(recliner) Lower Body Dressing: Moderate cueing Where Assessed-Lower Body Dressing: Other (Comment)(recliner) Toileting: Maximal assistance Where Assessed-Toileting: Bedside Commode Toilet Transfer: Minimal assistance Toilet Transfer Method: Squat pivot Toilet Transfer Equipment: Geophysical data processor: Not assessed Vision Baseline Vision/History: Wears glasses Wears Glasses: At all times Patient Visual Report: No change from baseline Perception    Praxis Praxis: Intact Cognition Overall Cognitive Status: Impaired/Different from baseline Arousal/Alertness: Awake/alert Orientation Level: Oriented to person;Oriented to place Attention: Selective Sustained Attention: Appears intact Selective Attention: Impaired Selective Attention Impairment: Verbal complex;Functional complex Memory: Impaired Memory Impairment: Decreased recall of new information;Decreased short term memory Decreased Short Term Memory: Verbal complex;Functional complex Problem Solving: Impaired Problem Solving Impairment: Verbal complex;Functional complex Executive Function: Self Monitoring;Self Correcting;Reasoning Reasoning: Impaired Reasoning Impairment: Verbal complex;Functional complex Self Monitoring: Impaired Self Monitoring Impairment: Verbal complex;Functional complex Sensation Sensation Light Touch: Appears  Intact Proprioception: Impaired Detail Proprioception Impaired Details: Absent RLE;Absent LLE Coordination Gross Motor Movements are Fluid and Coordinated: Yes Fine Motor Movements are Fluid and Coordinated: No Coordination and Movement Description: improved since eval Finger Nose Finger Test: Overshoots, slow Motor  Motor Motor: Tetraplegia Motor - Discharge Observations: improved BUE strength since eval, ongoing BLE weakness R>L Mobility  Bed Mobility Bed Mobility: Rolling Right;Rolling Left;Supine to Sit;Sit to Supine Rolling Right: Minimal Assistance - Patient > 75% Rolling Left: Minimal  Assistance - Patient > 75% Supine to Sit: Moderate Assistance - Patient 50-74% Sit to Supine: Minimal Assistance - Patient > 75%  Trunk/Postural Assessment  Cervical Assessment Cervical Assessment: Exceptions to WFL(limited due to precautions) Thoracic Assessment Thoracic Assessment: Exceptions to WFL(forward shoulders) Lumbar Assessment Lumbar Assessment: Exceptions to WFL(posterior pelvic tilit) Postural Control Postural Control: Within Functional Limits  Balance Balance Balance Assessed: Yes Static Sitting Balance Static Sitting - Balance Support: Feet supported;No upper extremity supported Static Sitting - Level of Assistance: 6: Modified independent (Device/Increase time) Dynamic Sitting Balance Dynamic Sitting - Balance Support: Feet supported;During functional activity;No upper extremity supported Dynamic Sitting - Level of Assistance: 5: Stand by assistance Extremity/Trunk Assessment RUE Assessment Active Range of Motion (AROM) Comments: WFL General Strength Comments: 3+/5 LUE Assessment LUE Assessment: Exceptions to [facility] Active Range of Motion (AROM) Comments: WFL General Strength Comments: 3+/5   Gypsy Decant 09/18/2018, 4:44 PM

## 2018-09-18 NOTE — Progress Notes (Signed)
PA aware of lower extremity edema ted hose placed by therapist.

## 2018-09-18 NOTE — Progress Notes (Signed)
Occupational Therapy Session Note  Patient Details  Name: Brandon Collins MRN: [id number] Date of Birth: 04-28-1951  Today's Date: 09/18/2018 OT Individual Time: 3559-7416 OT Individual Time Calculation (min): 33 min    Short Term Goals: Week 4:  OT Short Term Goal 1 (Week 4): STGs=LTGs due to ELOS  Skilled Therapeutic Interventions/Progress Updates:    Session focused on core stabilization and B UE strengthening/conditioning in prep for functional use in ADLs. Pt received in w/c with no c/o pain eager to participate. Pt completed slideboard transfer to mat with min A to set up transfer and to support B LE. Pt sat EOM and completed B UE coordination with active engagement of trunk, vc provided throughout for technique/form. Pt used 3lb dumbbell to complete endurance based circuit, no cervical pain or strain reported throughout. Pt returned to his w/c with CGA during slideboard transfer. 150 ft of w/c propulsion completed, no cueing but slow pace. Pt was handed off to PT in gym.   Therapy Documentation Precautions:  Precautions Precautions: Cervical, Fall Precaution Comments: Reviewed cervical precautions Required Braces or Orthoses: Cervical Brace Cervical Brace: Hard collar, At all times Restrictions Weight Bearing Restrictions: No Pain: Pain Assessment Pain Scale: 0-10 Pain Score: 0-No pain Faces Pain Scale: Hurts little more Pain Type: Acute pain Pain Location: Ankle Pain Orientation: Right Pain Descriptors / Indicators: Discomfort Pain Frequency: Occasional Pain Intervention(s): Medication (See eMAR)   Therapy/Group: Individual Therapy  Crissie Reese 09/18/2018, 12:06 PM

## 2018-09-18 NOTE — Patient Care Conference (Cosign Needed)
Inpatient RehabilitationTeam Conference and Plan of Care Update Date: 09/16/2018   Time: 2:10 PM    Patient Name: Brandon Collins      Medical Record Number: [id number]  Date of Birth: [birth year] Sex: Male         Room/Bed: 4W08C/4W08C-01 Payor Info: Payor: [employer] / Plan: C[employer] MANAGED / Product Type: *No Product type* /    Admitting Diagnosis: ACDF with myelopathy  Admit Date/Time:  08/19/2018 11:36 AM Admission Comments: No comment available   Primary Diagnosis:  <principal problem not specified> Principal Problem: <principal problem not specified>  Patient Active Problem List   Diagnosis Date Noted  . Hyponatremia   . Pain   . Cervical myelopathy (HCC) 08/19/2018  . Cervical cord compression with myelopathy (HCC)   . Postoperative pain   . Diabetes mellitus type 2 in nonobese (HCC)   . Essential hypertension   . Dyslipidemia   . Drug induced constipation   . Myelopathy (HCC) 08/12/2018    Expected Discharge Date: Expected Discharge Date: (SNF)  Team Members Present: Physician leading conference: Dr. Faith Rogue Social Worker Present: Amada Jupiter, LCSW Nurse Present: Ronny Bacon, RN PT Present: Karolee Stamps, PT OT Present: Roney Mans, OT SLP Present: Feliberto Gottron, SLP PPS Coordinator present : Fae Pippin     Current Status/Progress Goal Weekly Team Focus  Medical   Eating better.  Bowels and bladder improving.  Cognitively has shown improvement but still with limited insight and awareness  Manage bowel and bladder  Blood pressure management, nutrition   Bowel/Bladder   continent of B/B episodes of incontinence                  lbm 12/30  continent of B/B normal bowel pattern  assist with toileting needs prn   Swallow/Nutrition/ Hydration             ADL's   Mod-Max A slideboard or scoot transfers, Min-Max A self care bedlevel. Functional status fluctuates due to R UE/LE pain  Min A overall- d/c SNF  NMR, cognitive remediation, pain mgt, general  strengthening and endurance, pt/family education, d/c planning   Mobility   min to max A bed mobility, transfers vary with mostly CGA to min a with SB this week, S w/c mobility  downgraded to min A overall  continued focus on increasing independence with transfers and bed mobility, BLE NMR   Communication             Safety/Cognition/ Behavioral Observations  Mod-supervision A, inconsistent   Min A - downgraded from some Sup goals  medication management (semi-complex), memory notebook, orientation and error awareness   Pain   neck and bilateral ankle pain managed with tramadol prn  pain <+ 2/10  assess pain qshift and prn   Skin   neck incision   maintain skin integrity no s/sx of infection  assess skin qshift and prn    Rehab Goals Patient on target to meet rehab goals: No *See Care Plan and progress notes for long and short-term goals.     Barriers to Discharge  Current Status/Progress Possible Resolutions Date Resolved   Physician    Medical stability        Continue management as above      Nursing                  PT  Other (comments)  pt's wife unable to provide physical assist pt will require at d/c  OT                  SLP                SW                Discharge Planning/Teaching Needs:  PLan changed to SNF due to care demands too great for wife.  NA   Team Discussion:  Medically ready for d/c to SNF when bed available.  Still variable level of assist but overall min - max assist.  Revisions to Treatment Plan:  Change of d/c plan to SNF    Continued Need for Acute Rehabilitation Level of Care: The patient requires daily medical management by a physician with specialized training in physical medicine and rehabilitation for the following conditions: Daily direction of a multidisciplinary physical rehabilitation program to ensure safe treatment while eliciting the highest outcome that is of practical value to the patient.: Yes Daily medical  management of patient stability for increased activity during participation in an intensive rehabilitation regime.: Yes Daily analysis of laboratory values and/or radiology reports with any subsequent need for medication adjustment of medical intervention for : Post surgical problems;Neurological problems   I attest that I was present, lead the team conference, and concur with the assessment and plan of the team.   Kamalei Roeder 09/18/2018, 11:02 AM

## 2018-09-18 NOTE — Progress Notes (Signed)
Patient to be discharged today per ambulance per social work. Report called to [facility] [phone]) report given to [employer].

## 2018-09-18 NOTE — Progress Notes (Signed)
Social Work Patient ID: Brandon Collins, male   DOB: [birth year], 68 y.o.   MRN: [id number]  Have received SNF bed offer from [facility] and pt/ wife have accepted with plan to transfer today after lunch via ambulance.  Tx team aware.  Amirra Herling, LCSW

## 2018-09-18 NOTE — Progress Notes (Signed)
[place] PHYSICAL MEDICINE & REHABILITATION PROGRESS NOTE   Subjective/Complaints: In good spirits. Told me about the book he's reading.   ROS: Patient denies fever, rash, sore throat, blurred vision, nausea, vomiting, diarrhea, cough, shortness of breath or chest pain, joint or back pain, headache, or mood change.     Objective:   No results found. No results for input(s): WBC, HGB, HCT, PLT in the last 72 hours. No results for input(s): NA, K, CL, CO2, GLUCOSE, BUN, CREATININE, CALCIUM in the last 72 hours.  Intake/Output Summary (Last 24 hours) at 09/18/2018 0921 Last data filed at 09/18/2018 0700 Gross per 24 hour  Intake 240 ml  Output 1350 ml  Net -1110 ml     Physical Exam: Vital Signs Blood pressure (!) 152/78, pulse 79, temperature 98.3 F (36.8 C), temperature source Oral, resp. rate 16, height 6' (1.829 m), weight 88.3 kg, SpO2 100 %.  Constitutional: No distress . Vital signs reviewed. HEENT: EOMI, oral membranes moist Neck: supple Cardiovascular: RRR without murmur. No JVD    Respiratory: CTA Bilaterally without wheezes or rales. Normal effort    GI: BS +, non-tender, non-distended  Musculoskeletal: No edema or tenderness in extremities Neurological.  Alert  Motor: Right upper extremity: 4+/5 prox to distal.  Left upper extremity: 5/5 proximal distal Left lower extremity: 3+/5 proximal distal, stable Right lower extremity: Hip flexion, knee extension 2-/5, ankle dorsiflexion  2-/5---    Skin is warm and dry. Surgical site cdi Psychiatric: cooperative   Assessment/Plan: 1. Functional deficits secondary to cervical myelopathy which require 3+ hours per day of interdisciplinary therapy in a comprehensive inpatient rehab setting.  Physiatrist is providing close team supervision and 24 hour management of active medical problems listed below.  Physiatrist and rehab team continue to assess barriers to discharge/monitor patient progress toward functional and  medical goals  Care Tool:  Bathing    Body parts bathed by patient: Right arm, Left arm, Chest, Abdomen   Body parts bathed by helper: Buttocks     Bathing assist Assist Level: Supervision/Verbal cueing     Upper Body Dressing/Undressing Upper body dressing   What is the patient wearing?: Pull over shirt    Upper body assist Assist Level: Set up assist    Lower Body Dressing/Undressing Lower body dressing      What is the patient wearing?: Pants     Lower body assist Assist for lower body dressing: Maximal Assistance - Patient 25 - 49%     Toileting Toileting    Toileting assist Assist for toileting: Set up assist Assistive Device Comment: (help place a urinal)   Transfers Chair/bed transfer  Transfers assist  Chair/bed transfer activity did not occur: Safety/medical concerns  Chair/bed transfer assist level: Minimal Assistance - Patient > 75%     Locomotion Ambulation   Ambulation assist   Ambulation activity did not occur: Safety/medical concerns          Walk 10 feet activity   Assist  Walk 10 feet activity did not occur: Safety/medical concerns        Walk 50 feet activity   Assist Walk 50 feet with 2 turns activity did not occur: Safety/medical concerns         Walk 150 feet activity   Assist Walk 150 feet activity did not occur: Safety/medical concerns         Walk 10 feet on uneven surface  activity   Assist Walk 10 feet on uneven surfaces activity did not occur:  Safety/medical concerns         Wheelchair     Assist Will patient use wheelchair at discharge?: Yes Type of Wheelchair: Manual Wheelchair activity did not occur: Safety/medical concerns  Wheelchair assist level: Supervision/Verbal cueing Max wheelchair distance: 150'    Wheelchair 50 feet with 2 turns activity    Assist    Wheelchair 50 feet with 2 turns activity did not occur: Safety/medical concerns   Assist Level:  Supervision/Verbal cueing   Wheelchair 150 feet activity     Assist Wheelchair 150 feet activity did not occur: Safety/medical concerns   Assist Level: Supervision/Verbal cueing     Medical Problem List and Plan: 1.  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 Cervical collar at all times   -follow up in office in 4-6 weeks  -SNF today? 2.  DVT Prophylaxis/Anticoagulation: SCDs. Vascular study negative  -  lovenox 30mg  q12   3. Pain Management:  Oxycodone and Valium as needed. 4. Mood:  Provide emotional support 5. Neuropsych: This patient is capable of making decisions on his own behalf.  -trazodone prn for sleep 6. Skin/Wound Care:  Routine skin checks 7. Fluids/Electrolytes/Nutrition, acute renal insufficiency:   Resolved  -encourage po 8. Diabetes mellitus. Glucophage 1500 mg daily (held), Actos 15 mg daily, Amaryl 4 mg daily.       CBG (last 3)  Recent Labs    09/17/18 0631 09/17/18 1149 09/18/18 0621  GLUCAP 87 162* 88       cbg's qam only 9. Hypertension.   Lisinopril 40 mg daily on hold  Continue clonidine 0.1 mg twice a day, Norvasc 10 mg daily.   Vitals:   09/17/18 2037 09/18/18 0516  BP: (!) 151/61 (!) 152/78  Pulse: 81 79  Resp: 18 16  Temp: 98.5 F (36.9 C) 98.3 F (36.8 C)  SpO2: 99% 100%   Borderline to improved:    consider resuming lisinopril if persistently elevated. 10. Hyperlipidemia. Lipitor 11. Constipation. Laxative assistance 12. Leukocytosis:  Resolved  -ucx + for 100k e coli  -sensitive to keflex---completed 13. Colonic ileus: resolved  -belly soft , non distended    -stools now regular but hard, resume senna-s  -advanced to regular diet  -continue OOB  -potassium normal 14.  Hyponatremia  Sodium 134 on 12/27   Continue to monitor  LOS: 30 days A FACE TO FACE EVALUATION WAS PERFORMED  Ranelle Oyster 09/18/2018, 9:21 AM

## 2018-09-18 NOTE — Progress Notes (Signed)
Physical Therapy Session Note  Patient Details  Name: Brandon OchsCarl S Viramontes MRN: [id number] Date of Birth: December 29, 1950  Today's Date: 09/18/2018 PT Individual Time: 0920-0945 PT Individual Time Calculation (min): 25 min   Short Term Goals: Week 4:  PT Short Term Goal 1 (Week 4): =LTG due to ELOS  Skilled Therapeutic Interventions/Progress Updates:    Session 1: Pt received seated in bed, agreeable to PT. Pt appears in better spirits this AM and reports improvement in RLE pain since yesterday. Max A to don TED hose and socks. Supine to sit with min A with HOB elevated. Pt is max A to don pants while seated EOB, is able to perform mini-stand to get pants up over bottom. Slide board transfer bed to w/c with min A. Pt is setup A to brush teeth while seated in w/c at sink. Pt left seated in w/c in room with needs in reach and quick release belt/chair alarm in place.  Therapy Documentation Precautions:  Precautions Precautions: Cervical, Fall Precaution Comments: Reviewed cervical precautions Required Braces or Orthoses: Cervical Brace Cervical Brace: Hard collar, At all times Restrictions Weight Bearing Restrictions: No Pain: Pain Assessment Pain Scale: 0-10 Pain Score: 0-No pain Faces Pain Scale: Hurts little more Pain Type: Acute pain Pain Location: Ankle Pain Orientation: Right Pain Descriptors / Indicators: Discomfort Pain Frequency: Occasional Pain Intervention(s): Medication (See eMAR)   Therapy/Group: Individual Therapy  Peter Congoaylor Estes Lehner, PT, DPT  09/18/2018, 10:06 AM

## 2018-09-18 NOTE — Progress Notes (Signed)
Social Work  Discharge Note  The overall goal for the admission was met for:   Discharge location: No - plan changed to SNF  Length of Stay: No - extended slightly from initial LOS due to change in plan to SNF  Discharge activity level: No - several goals unmet  Home/community participation: No  Services provided included: MD, RD, PT, OT, SLP, RN, TR, Pharmacy, [place]: Medicare and Private Insurance: [place]  Follow-up services arranged: Other: SNF at [facility]  Comments (or additional information):  Patient/Family verbalized understanding of follow-up arrangements: Yes  Individual responsible for coordination of the follow-up plan: pt/ spouse  Confirmed correct DME delivered: NA  Delyla Sandeen

## 2018-09-18 NOTE — Progress Notes (Signed)
Physical Therapy Discharge Summary  Patient Details  Name: Brandon Collins MRN: [id number] Date of Birth: 11-Apr-1951  Today's Date: 09/18/2018 PT Individual Time: 1015-1100 PT Individual Time Calculation (min): 45 min    Patient has met 4 of 4 long term goals due to improved activity tolerance, improved balance, improved postural control, increased strength and ability to compensate for deficits.  Patient to discharge at a wheelchair level [place] to a skilled nursing facility.   Patient's care partner is unable to provide necessary physical assist patient at discharge.  Reasons goals not met: Pt has met all rehab goals.  Recommendation:  Patient will benefit from ongoing skilled PT services in skilled nursing facility setting to continue to advance safe functional mobility, address ongoing impairments in BLE strength, coordination, safety, independence with functional mobility, and minimize fall risk.  Equipment: No equipment provided. Equipment needs to be determined by next accepting facility.  Reasons for discharge: treatment goals met and discharge from hospital  Patient/family agrees with progress made and goals achieved: Yes   Skilled Intervention: Pt received seated in w/c in therapy gym handed off from OT, agreeable to PT session. No complaints of pain. Slide board transfer w/c to/from mat table with CGA to min A, v/c for safe setup and sequencing. Manual w/c propulsion x 150 ft with BUE and Supervision. Pt requests to use the bathroom. Slide board transfer w/c to/from commode over toilet seat with min A. Pt is able to lean L/R while seated on commode as well as perform mini-stand with min to mod A for dependent clothing management and pericare. Pt left seated in w/c in room handed off to OT for next therapy session.  PT Discharge Precautions/Restrictions Precautions Precautions: Cervical;Fall Required Braces or Orthoses: Cervical Brace Cervical Brace: Hard collar;At all  times Restrictions Weight Bearing Restrictions: No Vision/Perception  Vision - History Baseline Vision: Wears glasses only for reading Perception Perception: Within Functional Limits Praxis Praxis: Intact  Cognition Overall Cognitive Status: Impaired/Different from baseline Arousal/Alertness: Awake/alert Orientation Level: Oriented X4 Attention: Selective Selective Attention: Impaired Selective Attention Impairment: Verbal complex;Functional complex Memory: Impaired Memory Impairment: Decreased recall of new information;Decreased short term memory Decreased Short Term Memory: Verbal complex;Functional complex Awareness: Impaired Awareness Impairment: Emergent impairment;Anticipatory impairment Problem Solving: Impaired Problem Solving Impairment: Verbal complex;Functional complex Executive Function: Self Monitoring;Self Correcting;Reasoning Reasoning: Impaired Reasoning Impairment: Verbal complex;Functional complex Self Monitoring: Impaired Self Monitoring Impairment: Verbal complex;Functional complex Self Correcting: Impaired Self Correcting Impairment: Verbal complex;Functional complex Behaviors: Impulsive Safety/Judgment: Impaired Sensation Sensation Light Touch: Appears Intact(does report ongoing N/T in B hands, has improved since eval) Proprioception: Impaired Detail Proprioception Impaired Details: Absent RLE;Absent LLE Coordination Gross Motor Movements are Fluid and Coordinated: Yes Coordination and Movement Description: improved since eval Heel Shin Test: unable to assess 2/2 weakness, ROM deficits, and pain Motor  Motor Motor: Tetraplegia Motor - Discharge Observations: improved BUE strength since eval, ongoing BLE weakness R>L  Mobility Bed Mobility Bed Mobility: Rolling Right;Rolling Left;Supine to Sit;Sit to Supine Rolling Right: Minimal Assistance - Patient > 75% Rolling Left: Minimal Assistance - Patient > 75% Supine to Sit: Moderate Assistance -  Patient 50-74% Sit to Supine: Minimal Assistance - Patient > 75% Transfers Transfers: Lateral/Scoot Transfers Lateral/Scoot Transfers: Minimal Assistance - Patient > 75% Transfer (Assistive device): Other (Comment)(slide board) Locomotion  Gait Ambulation: No Gait Gait: No Stairs / Additional Locomotion Stairs: No Wheelchair Mobility Wheelchair Mobility: Yes Wheelchair Assistance: Chartered loss adjuster: Both upper extremities Wheelchair Parts Management: Needs assistance Distance: 150  Trunk/Postural Assessment  Cervical Assessment Cervical Assessment: Exceptions to WFL(limited due to precautions) Thoracic Assessment Thoracic Assessment: Exceptions to WFL(rounded shoulders) Lumbar Assessment Lumbar Assessment: Exceptions to WFL(posterior pelvic tilt) Postural Control Postural Control: Within Functional Limits Trunk Control: improved since eval Righting Reactions: improved since eval  Balance Balance Balance Assessed: Yes Static Sitting Balance Static Sitting - Balance Support: Feet supported;No upper extremity supported Static Sitting - Level of Assistance: 6: Modified independent (Device/Increase time) Dynamic Sitting Balance Dynamic Sitting - Balance Support: Feet supported;During functional activity;No upper extremity supported Dynamic Sitting - Level of Assistance: 5: Stand by assistance Extremity Assessment   RLE Assessment RLE Assessment: Exceptions to [facility] Passive Range of Motion (PROM) Comments: tight HS and hip flexors General Strength Comments: improved since eval, ongoing weakness RLE Strength Right Hip Flexion: 2-/5 Right Knee Flexion: 2-/5 Right Knee Extension: 2-/5 Right Ankle Dorsiflexion: 3/5 LLE Assessment LLE Assessment: Exceptions to [facility] General Strength Comments: improved since eval, ongoing weakness LLE Strength Left Hip Flexion: 3/5 Left Knee Flexion: 3+/5 Left Knee Extension: 4/5 Left Ankle Dorsiflexion:  4/5    Excell Seltzer, PT, DPT 09/18/2018, 12:12 PM

## 2018-09-18 NOTE — Progress Notes (Signed)
Patient discharged to SNF per ambulance report given to receiving RN.

## 2018-09-18 NOTE — Progress Notes (Signed)
Speech Language Pathology Daily Session Note  Patient Details  Name: Brandon Collins MRN: [id number] Date of Birth: [birth year]  Today's Date: 09/18/2018 SLP Individual Time: 0815-0900 SLP Individual Time Calculation (min): 45 min  Short Term Goals: Week 3: SLP Short Term Goal 1 (Week 3): STG=LTG due to remaining length of stay, awaiting SNF placement  Skilled Therapeutic Interventions:  Skilled treatment session focused on cognition goals. SLP facilitated session by providing Min A cues for recall of general information facilitated by use of memory notebook. SLP provided education regarding need for further rehab at SNF d/t pt with no awareness of deficits and how they impact independence within home setting. Pt continues to exhibit mild throat clears (very subtle) after swallow but has not demonstrated any respiratory compromise. Pt demonstrates little mental flexibility which impedes safety and comprehension of strategies for safety (such as wearing fall risk bracelet).          Pain Pain Assessment Pain Scale: 0-10 Pain Score: 0-No pain Faces Pain Scale: Hurts little more Pain Type: Acute pain Pain Location: Ankle Pain Orientation: Right Pain Descriptors / Indicators: Discomfort Pain Frequency: Occasional Pain Intervention(s): Medication (See eMAR)  Therapy/Group: Individual Therapy  [name] 09/18/2018, 9:37 AM

## 2018-09-19 ENCOUNTER — Encounter: Payer: Self-pay | Admitting: Physical Medicine & Rehabilitation

## 2018-09-19 ENCOUNTER — Inpatient Hospital Stay (HOSPITAL_COMMUNITY): Payer: Managed Care, Other (non HMO) | Admitting: Speech Pathology

## 2018-10-21 ENCOUNTER — Encounter: Payer: Medicare HMO | Attending: Physical Medicine & Rehabilitation | Admitting: Physical Medicine & Rehabilitation

## 2018-10-21 ENCOUNTER — Other Ambulatory Visit: Payer: Self-pay

## 2018-10-21 ENCOUNTER — Encounter: Payer: Self-pay | Admitting: Physical Medicine & Rehabilitation

## 2018-10-21 VITALS — BP 113/71 | HR 103 | Ht 72.0 in | Wt 190.0 lb

## 2018-10-21 DIAGNOSIS — G959 Disease of spinal cord, unspecified: Secondary | ICD-10-CM | POA: Diagnosis present

## 2018-10-21 DIAGNOSIS — Z1331 Encounter for screening for depression: Secondary | ICD-10-CM | POA: Diagnosis not present

## 2018-10-21 DIAGNOSIS — G9520 Unspecified cord compression: Secondary | ICD-10-CM

## 2018-10-21 DIAGNOSIS — E7800 Pure hypercholesterolemia, unspecified: Secondary | ICD-10-CM | POA: Diagnosis not present

## 2018-10-21 DIAGNOSIS — L97311 Non-pressure chronic ulcer of right ankle limited to breakdown of skin: Secondary | ICD-10-CM | POA: Diagnosis not present

## 2018-10-21 DIAGNOSIS — M1711 Unilateral primary osteoarthritis, right knee: Secondary | ICD-10-CM | POA: Diagnosis not present

## 2018-10-21 DIAGNOSIS — G8250 Quadriplegia, unspecified: Secondary | ICD-10-CM | POA: Insufficient documentation

## 2018-10-21 DIAGNOSIS — N319 Neuromuscular dysfunction of bladder, unspecified: Secondary | ICD-10-CM | POA: Insufficient documentation

## 2018-10-21 DIAGNOSIS — I10 Essential (primary) hypertension: Secondary | ICD-10-CM | POA: Insufficient documentation

## 2018-10-21 DIAGNOSIS — E119 Type 2 diabetes mellitus without complications: Secondary | ICD-10-CM | POA: Insufficient documentation

## 2018-10-21 DIAGNOSIS — Z981 Arthrodesis status: Secondary | ICD-10-CM | POA: Diagnosis not present

## 2018-10-21 DIAGNOSIS — K592 Neurogenic bowel, not elsewhere classified: Secondary | ICD-10-CM | POA: Insufficient documentation

## 2018-10-21 DIAGNOSIS — Z87891 Personal history of nicotine dependence: Secondary | ICD-10-CM | POA: Diagnosis not present

## 2018-10-21 MED ORDER — COLLAGENASE 250 UNIT/GM EX OINT: 1.0000 "application " | TOPICAL_OINTMENT | Freq: Every day | CUTANEOUS | 2 refills | Status: DC

## 2018-10-21 NOTE — Progress Notes (Signed)
Subjective:    Patient ID: Brandon Collins, male    DOB: [birth year], 68 y.o.   MRN: [id number]  HPI   Brandon Collins is here in follow up of his cervical spinal cord injury. He was discharged to home from the SNF on Saturday. [facility]   Since being home he has been transferring well. He hasn't walked yet. He is emptying into his diaper but knows when he's voiding. He's emptying sometimes into urinal while sidelying in bed.   Pain levels have improved. He is rx'ed tylenol and tramadol for pain. He is rarely using tramadol.   He has an ulcer on his right ankle and a "red" spot where his knee is braced.   He has had some knee pain. Ortho is following. He had an injection which did not help.   His cervical spine is coming along nicely.    Pain Inventory Average Pain 3 Pain Right Now 0 My pain is dull and aching  In the last 24 hours, has pain interfered with the following? General activity 0 Relation with others 0 Enjoyment of life 0 What TIME of day is your pain at its worst? night Sleep (in general) Good  Pain is worse with: unsure Pain improves with: none Relief from Meds: 6  Mobility ability to climb steps?  no do you drive?  no use a wheelchair needs help with transfers  Function retired I need assistance with the following:  feeding, dressing, bathing, meal prep and household duties  Neuro/Psych tingling trouble walking spasms  Prior Studies Any changes since last visit?  no  Physicians involved in your care Any changes since last visit?  no   Family History  Problem Relation Age of Onset  . Cancer Mother   . Stroke Father        multiple   Social History   Socioeconomic History  . Marital status: Married    Spouse name: Not on file  . Number of children: Not on file  . Years of education: Not on file  . Highest education level: Not on file  Occupational History  . Not on file  Social Needs  . Financial resource strain: Not on file    . Food insecurity:    Worry: Not on file    Inability: Not on file  . Transportation needs:    Medical: Not on file    Non-medical: Not on file  Tobacco Use  . Smoking status: Former Smoker    Packs/day: 1.00    Years: 30.00    Pack years: 30.00    Types: Cigarettes  . Smokeless tobacco: Never Used  . Tobacco comment: 08/12/2018 "nothing in the 2000s"  Substance and Sexual Activity  . Alcohol use: Not Currently    Comment: 08/12/2018 "nothing in the 2000s"  . Drug use: Never  . Sexual activity: Not Currently  Lifestyle  . Physical activity:    Days per week: Not on file    Minutes per session: Not on file  . Stress: Not on file  Relationships  . Social connections:    Talks on phone: Not on file    Gets together: Not on file    Attends religious service: Not on file    Active member of club or organization: Not on file    Attends meetings of clubs or organizations: Not on file    Relationship status: Not on file  Other Topics Concern  . Not on file  Social  History Narrative  . Not on file   Past Surgical History:  Procedure Laterality Date  . ANTERIOR CERVICAL DECOMP/DISCECTOMY FUSION  08/12/2018   CERVICAL 3-4, CERVICAL 4-5, CERVICAL 5-6, CERVICAL 6-7 WITH INSTRUMENTATION AND ALLOGRAFT  . ANTERIOR CERVICAL DECOMPRESSION/DISCECTOMY FUSION 4 LEVELS N/A 08/12/2018   Procedure: ANTERIOR CERVICAL DECOMPRESSION FUSION CERVICAL 3-4, CERVICAL 4-5, CERVICAL 5-6, CERVICAL 6-7 WITH INSTRUMENTATION AND ALLOGRAFT;  Surgeon: Estill Bambergumonski, Mark, MD;  Location: [facility] OR;  Service: Orthopedics;  Laterality: N/A;  ANTERIOR CERVICAL DECOMPRESSION FUSION CERVICAL 3-4, CERVICAL 4-5, CERVICAL 5-6, CERVICAL 6-7 WITH INSTRUMENTATION AND ALLOGRAFT  . BACK SURGERY    . INGUINAL HERNIA REPAIR  1958   "? side"  . KNEE ARTHROSCOPY Right   . PILONIDAL CYST EXCISION     Past Medical History:  Diagnosis Date  . Arthritis    "all over" (08/12/2018)  . High cholesterol   . Hypertension   . Type II  diabetes mellitus (HCC)    BP 113/71   Pulse (!) 103   Ht 6' (1.829 m) Comment: pt reported  Wt 190 lb (86.2 kg) Comment: pt reported, in wheelchair  SpO2 98%   BMI 25.77 kg/m   Opioid Risk Score:   Fall Risk Score:  `1  Depression screen PHQ 2/9  Depression screen PHQ 2/9 10/21/2018  Decreased Interest 0  Down, Depressed, Hopeless 0  PHQ - 2 Score 0  Altered sleeping 0  Tired, decreased energy 2  Change in appetite 2  Feeling bad or failure about yourself  0  Trouble concentrating 1  Moving slowly or fidgety/restless 0  Suicidal thoughts 0  PHQ-9 Score 5  Difficult doing work/chores Somewhat difficult    Review of Systems  Constitutional: Negative.   HENT: Negative.   Eyes: Negative.   Respiratory: Negative.   Cardiovascular:       Limb swelling  Gastrointestinal: Negative.   Endocrine: Negative.   Genitourinary: Negative.   Musculoskeletal: Negative.   Skin: Negative.   Allergic/Immunologic: Negative.   Neurological: Negative.   Hematological: Negative.   Psychiatric/Behavioral: Negative.   All other systems reviewed and are negative.      Objective:   Physical Exam   General: Alert and oriented x 3, No apparent distress HEENT: Head is normocephalic, atraumatic, PERRLA, EOMI, sclera anicteric, oral mucosa pink and moist, dentition intact, ext ear canals clear,  Neck: Supple without JVD or lymphadenopathy Heart: Reg rate and rhythm. No murmurs rubs or gallops Chest: CTA bilaterally without wheezes, rales, or rhonchi; no distress Abdomen: Soft, non-tender, non-distended, bowel sounds positive. Extremities: No clubbing, cyanosis, or edema. Pulses are 2+ Skin: He is a stage II wound along the right lateral malleolus that measures about 2 cm in diameter.  Significant slough tissue in the wound.  Minimal odor. Neuro: Patient demonstrates improved insight and awareness.  Memory seems functional.  Language is normal.  Upper extremity strength is nearly 5 out of  5.  Left lower extremity is 4- to 4 out of 5 proximal distal.  Right lower extremity is 2- out of 5 with hip flexion and knee extension.  Ankle dorsiflexion plantarflexion 2 out of 5.  Right knee contracture is limiting.  Sensation is diminished to light touch and pain but he is able to sense both.  Musculoskeletal: Right knee is limited to -30 a full extension.  Knee appears sclerotic.  Nontender to palpation.  He did have tenderness when I tried to extend his knee further, a lot of that was in the hamstring area.  Psych: Pt's affect is appropriate. Pt is cooperative        Assessment & Plan:  1. 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 Cervical collar at all times            Getting home health therapies.  Also will need home health nursing given his wound issues.   2.  Pain Management: tylenol.  Tramadol rarely 3. Skin/Wound Care: Reviewed wound care with patient.  Wrote prescription for collagenase ointment to be applied to the ankle daily.  Home health nurse to follow also. 4. Diabetes mellitus.  Per primary 9. Hypertension.   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  The main issues appear to be spacing and set up at home.  They may continue with how they are doing things currently.  I did tell them I want to occupational therapy to address voiding at least at the bedside.  Perhaps if bedside commode could be placed near the bed that would be an option to help stimulate emptying more effectively.  Right now the bathroom does not accommodate a wheelchair due to its size. 6.  Osteoarthritis right knee.  Continue per orthopedic surgery recommendations.  However given the ongoing weakness in his right lower extremity, any type of orthopedic intervention at this point would probably not add much benefit given his poor knee control and sensory deficits.  -Will likely benefit from another type of knee brace long-term to help with gait.  He is  not at that point yet.  -Reviewed knee extension exercises with the patient today. About 30 minutes was spent with the patient and his wife today in discussing his treatment plan and performing examination.  I will see him back in about 6 weeks time.

## 2018-10-21 NOTE — Patient Instructions (Addendum)
SANTYL (COLLAGENASE)  1. IN MORNING, SCRUB THE WOUND WITH SLIGHTLY MOIST GAUZE TO LOOSEN DEBRIS ON WOUND  2. THEN SLIGHTLY MOISTEN ANOTHER PIECE OF GAUZE AND PLACE A DAB OF COLLAGENASE ON THE WOUND.   3. COVER WOUND WITH DRY DRESSING AND TAPE TO HOLD.       RIGHT KNEE:  YOU NEED TO STRETCH THE HECK OUT OF THAT THING!!!!

## 2018-12-02 ENCOUNTER — Encounter: Payer: Medicare HMO | Attending: Physical Medicine & Rehabilitation | Admitting: Physical Medicine & Rehabilitation

## 2018-12-02 ENCOUNTER — Encounter: Payer: Self-pay | Admitting: Physical Medicine & Rehabilitation

## 2018-12-02 ENCOUNTER — Other Ambulatory Visit: Payer: Self-pay

## 2018-12-02 VITALS — BP 111/67 | HR 75 | Resp 14 | Ht 72.0 in | Wt 190.0 lb

## 2018-12-02 DIAGNOSIS — E7800 Pure hypercholesterolemia, unspecified: Secondary | ICD-10-CM | POA: Diagnosis not present

## 2018-12-02 DIAGNOSIS — Z1331 Encounter for screening for depression: Secondary | ICD-10-CM | POA: Insufficient documentation

## 2018-12-02 DIAGNOSIS — M1711 Unilateral primary osteoarthritis, right knee: Secondary | ICD-10-CM | POA: Insufficient documentation

## 2018-12-02 DIAGNOSIS — G959 Disease of spinal cord, unspecified: Secondary | ICD-10-CM | POA: Diagnosis present

## 2018-12-02 DIAGNOSIS — Z87891 Personal history of nicotine dependence: Secondary | ICD-10-CM | POA: Insufficient documentation

## 2018-12-02 DIAGNOSIS — G8250 Quadriplegia, unspecified: Secondary | ICD-10-CM | POA: Diagnosis not present

## 2018-12-02 DIAGNOSIS — I10 Essential (primary) hypertension: Secondary | ICD-10-CM | POA: Insufficient documentation

## 2018-12-02 DIAGNOSIS — E119 Type 2 diabetes mellitus without complications: Secondary | ICD-10-CM | POA: Diagnosis not present

## 2018-12-02 DIAGNOSIS — Z981 Arthrodesis status: Secondary | ICD-10-CM | POA: Insufficient documentation

## 2018-12-02 DIAGNOSIS — G9520 Unspecified cord compression: Secondary | ICD-10-CM | POA: Diagnosis not present

## 2018-12-02 DIAGNOSIS — K592 Neurogenic bowel, not elsewhere classified: Secondary | ICD-10-CM | POA: Insufficient documentation

## 2018-12-02 DIAGNOSIS — N319 Neuromuscular dysfunction of bladder, unspecified: Secondary | ICD-10-CM | POA: Insufficient documentation

## 2018-12-02 NOTE — Progress Notes (Signed)
Subjective:    Patient ID: Brandon Collins, male    DOB: [birth year], 68 y.o.   MRN: [id number]  HPI   Brandon Collins is here in follow up of his cervical SCI and multiple other issues. He is still working with [facility] therapies on strengthening and basic standing and mobility. He hasn't taken any steps yet. Orthopedics ordered an MRI of his right knee which showed "torn cartilage" per pt and the plan is conservative and therapy for now. His recent cervical surgery follow up was positive and the surgical site is healing.   Overall sugars have improved. He still doesn't have much of an appetite but is trying to eat and take supplements. He is now continent of his bowels and bladder.   Pain levels are improved.  He is seeing the wound care clinic for his foot which developed further breakdown. He is wearing a dressing currently.      Pain Inventory Average Pain 2 Pain Right Now 1 My pain is dull  In the last 24 hours, has pain interfered with the following? General activity 3 Relation with others 0 Enjoyment of life 0 What TIME of day is your pain at its worst? night Sleep (in general) Good  Pain is worse with: some activites Pain improves with: medication Relief from Meds: 7  Mobility use a wheelchair Do you have any goals in this area?  yes  Function retired I need assistance with the following:  bathing, meal prep and household duties Do you have any goals in this area?  yes  Neuro/Psych tingling  Prior Studies Any changes since last visit?  no  Physicians involved in your care Any changes since last visit?  no   Family History  Problem Relation Age of Onset  . Cancer Mother   . Stroke Father        multiple   Social History   Socioeconomic History  . Marital status: Married    Spouse name: Not on file  . Number of children: Not on file  . Years of education: Not on file  . Highest education level: Not on file  Occupational History  . Not on file  Social Needs   . Financial resource strain: Not on file  . Food insecurity:    Worry: Not on file    Inability: Not on file  . Transportation needs:    Medical: Not on file    Non-medical: Not on file  Tobacco Use  . Smoking status: Former Smoker    Packs/day: 1.00    Years: 30.00    Pack years: 30.00    Types: Cigarettes  . Smokeless tobacco: Never Used  . Tobacco comment: 08/12/2018 "nothing in the 2000s"  Substance and Sexual Activity  . Alcohol use: Not Currently    Comment: 08/12/2018 "nothing in the 2000s"  . Drug use: Never  . Sexual activity: Not Currently  Lifestyle  . Physical activity:    Days per week: Not on file    Minutes per session: Not on file  . Stress: Not on file  Relationships  . Social connections:    Talks on phone: Not on file    Gets together: Not on file    Attends religious service: Not on file    Active member of club or organization: Not on file    Attends meetings of clubs or organizations: Not on file    Relationship status: Not on file  Other Topics Concern  . Not on  file  Social History Narrative  . Not on file   Past Surgical History:  Procedure Laterality Date  . ANTERIOR CERVICAL DECOMP/DISCECTOMY FUSION  08/12/2018   CERVICAL 3-4, CERVICAL 4-5, CERVICAL 5-6, CERVICAL 6-7 WITH INSTRUMENTATION AND ALLOGRAFT  . ANTERIOR CERVICAL DECOMPRESSION/DISCECTOMY FUSION 4 LEVELS N/A 08/12/2018   Procedure: ANTERIOR CERVICAL DECOMPRESSION FUSION CERVICAL 3-4, CERVICAL 4-5, CERVICAL 5-6, CERVICAL 6-7 WITH INSTRUMENTATION AND ALLOGRAFT;  Surgeon: Estill Bambergumonski, Mark, MD;  Location: [facility] OR;  Service: Orthopedics;  Laterality: N/A;  ANTERIOR CERVICAL DECOMPRESSION FUSION CERVICAL 3-4, CERVICAL 4-5, CERVICAL 5-6, CERVICAL 6-7 WITH INSTRUMENTATION AND ALLOGRAFT  . BACK SURGERY    . INGUINAL HERNIA REPAIR  1958   "? side"  . KNEE ARTHROSCOPY Right   . PILONIDAL CYST EXCISION     Past Medical History:  Diagnosis Date  . Arthritis    "all over" (08/12/2018)  . High  cholesterol   . Hypertension   . Type II diabetes mellitus (HCC)    BP 111/67   Pulse 75   Resp 14   Ht 6' (1.829 m)   Wt 190 lb (86.2 kg) Comment: last fall Nov. 2019  SpO2 97%   BMI 25.77 kg/m   Opioid Risk Score:   Fall Risk Score:  `1  Depression screen PHQ 2/9  Depression screen PHQ 2/9 10/21/2018  Decreased Interest 0  Down, Depressed, Hopeless 0  PHQ - 2 Score 0  Altered sleeping 0  Tired, decreased energy 2  Change in appetite 2  Feeling bad or failure about yourself  0  Trouble concentrating 1  Moving slowly or fidgety/restless 0  Suicidal thoughts 0  PHQ-9 Score 5  Difficult doing work/chores Somewhat difficult    Review of Systems  Constitutional: Negative.   HENT: Negative.   Eyes: Negative.   Respiratory: Negative.   Cardiovascular: Negative.   Gastrointestinal: Negative.   Endocrine: Negative.   Genitourinary: Negative.   Musculoskeletal: Positive for arthralgias and gait problem.  Skin: Negative.   Allergic/Immunologic: Negative.   Neurological:       Tingling  Hematological: Negative.   Psychiatric/Behavioral: Negative.   All other systems reviewed and are negative.      Objective:   Physical Exam General: No acute distress HEENT: EOMI, oral membranes moist Cards: reg rate  Chest: normal effort Abdomen: Soft, NT, ND Skin: right ankle incision not examind, dressed Extremities: 1+. Neuro: Patient demonstrates improved insight and awareness.  Memory seems functional.  Language is normal.  Upper extremity strength is nearly 5 out of 5.  Left lower extremity is   4 out of 5 proximal distal.  Right lower extremity is 3 to 3+ out of 5 with hip flexion and knee extension BUT limited by flexion contracture.  Ankle dorsiflexion plantarflexion 2+ to 3 out of 5.     Sensation appears improved to LT and pain bilateral LE's Musculoskeletal: with force I could extend right knee to -10 degrees, hamstrings tight. sclerotic  Nontender to palpation.    Psych: Pt's affect is appropriate. Pt is cooperative. Cognition improved        Assessment & Plan:  1. 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 Cervical collar at all times -home health therapies continue    -should be an ambulator at some point, knee has to be more flexible first! 2.  Pain Management: tylenol.  Tramadol rarely 3. Skin/Wound Care: Reviewed wound care with patient.    -wound care is following his foot wounds 4. Diabetes mellitus.  back on actos and amaryl with better control.  9. Hypertension.  5.  Neurogenic bowel and bladder:   He is now continent of bowels and bladder.  6.  Osteoarthritis right knee.  Continue per orthopedic surgery recommendations.    -MRI with ?meniscal tear but no other structural damage apparently  -Needs to work on more aggressive stretching            -Reviewed knee extension exercises with the patient today.   15 minutes was spent with the patient and his wife today in discussing his treatment plan and performing examination.  I will see him back in about 2 months time.

## 2019-01-09 ENCOUNTER — Other Ambulatory Visit: Payer: Self-pay

## 2019-01-09 ENCOUNTER — Ambulatory Visit (HOSPITAL_COMMUNITY)
Admission: RE | Admit: 2019-01-09 | Discharge: 2019-01-09 | Disposition: A | Discharge status: Home / Self Care | Payer: Medicare HMO | Source: Ambulatory Visit | Attending: Family | Admitting: Family

## 2019-01-09 ENCOUNTER — Encounter: Payer: Self-pay | Admitting: *Deleted

## 2019-01-09 ENCOUNTER — Ambulatory Visit: Payer: Medicare HMO | Admitting: Vascular Surgery

## 2019-01-09 ENCOUNTER — Encounter: Payer: Self-pay | Admitting: Vascular Surgery

## 2019-01-09 VITALS — BP 119/64 | HR 72 | Temp 98.0°F | Resp 18 | Ht 72.0 in | Wt 200.0 lb

## 2019-01-09 DIAGNOSIS — I739 Peripheral vascular disease, unspecified: Secondary | ICD-10-CM

## 2019-01-09 NOTE — Progress Notes (Signed)
Patient ID: LAIF Brandon Collins, male   DOB: May 20, 1951, 68 y.o.   MRN: [id number]  Reason for Consult: No chief complaint on file.   Referred by Brandon Collins., MD, Liston Alba, MD  Subjective:     HPI:  Brandon Collins is a 68 y.o. male with history of recent neck surgery.  Has history of diabetes was previously a smoker but quit at the birth of his grandson.  Currently does not walk but did walk prior to this procedure.  Now has wound on his right lateral ankle.  Is never had DVT.  Does have swelling of his legs worse with dependent position.  Being treated at the wound care center in [place].  Has not had fevers or chills.  Past Medical History:  Diagnosis Date  . Arthritis    "all over" (08/12/2018)  . High cholesterol   . Hypertension   . Type II diabetes mellitus (HCC)    Family History  Problem Relation Age of Onset  . Cancer Mother   . Stroke Father        multiple   Past Surgical History:  Procedure Laterality Date  . ANTERIOR CERVICAL DECOMP/DISCECTOMY FUSION  08/12/2018   CERVICAL 3-4, CERVICAL 4-5, CERVICAL 5-6, CERVICAL 6-7 WITH INSTRUMENTATION AND ALLOGRAFT  . ANTERIOR CERVICAL DECOMPRESSION/DISCECTOMY FUSION 4 LEVELS N/A 08/12/2018   Procedure: ANTERIOR CERVICAL DECOMPRESSION FUSION CERVICAL 3-4, CERVICAL 4-5, CERVICAL 5-6, CERVICAL 6-7 WITH INSTRUMENTATION AND ALLOGRAFT;  Surgeon: Estill Bamberg, MD;  Location: [facility] OR;  Service: Orthopedics;  Laterality: N/A;  ANTERIOR CERVICAL DECOMPRESSION FUSION CERVICAL 3-4, CERVICAL 4-5, CERVICAL 5-6, CERVICAL 6-7 WITH INSTRUMENTATION AND ALLOGRAFT  . BACK SURGERY    . INGUINAL HERNIA REPAIR  1958   "? side"  . KNEE ARTHROSCOPY Right   . PILONIDAL CYST EXCISION      Short Social History:  Social History   Tobacco Use  . Smoking status: Former Smoker    Packs/day: 1.00    Years: 30.00    Pack years: 30.00    Types: Cigarettes  . Smokeless tobacco: Never Used  . Tobacco comment: 08/12/2018 "nothing in the 2000s"   Substance Use Topics  . Alcohol use: Not Currently    Comment: 08/12/2018 "nothing in the 2000s"    No Known Allergies  Current Outpatient Medications  Medication Sig Dispense Refill  . acetaminophen (TYLENOL) 325 MG tablet Take 2 tablets (650 mg total) by mouth every 4 (four) hours as needed for mild pain ((score 1 to 3) or temp > 100.5).    Marland Kitchen amLODipine (NORVASC) 10 MG tablet Take 10 mg by mouth daily.    Marland Kitchen atorvastatin (LIPITOR) 20 MG tablet Take 20 mg by mouth 4 (four) times a week. Evern Bio, Thur, and Sat  1  . baclofen (LIORESAL) 10 MG tablet Take 1 tablet (10 mg total) by mouth 3 (three) times daily as needed for muscle spasms. 30 each 0  . cholecalciferol (VITAMIN D) 1000 units tablet Take 1 tablet (1,000 Units total) by mouth daily.    . cloNIDine (CATAPRES) 0.1 MG tablet Take 0.1 mg by mouth 2 (two) times daily.    . collagenase (SANTYL) ointment Apply 1 application topically daily. To right ankle 30 g 2  . glimepiride (AMARYL) 4 MG tablet Take 1 tablet (4 mg total) by mouth daily with breakfast.    . pantoprazole (PROTONIX) 40 MG tablet Take 1 tablet (40 mg total) by mouth daily with supper.    . pioglitazone (ACTOS) 15  MG tablet Take 15 mg by mouth daily.    Marland Kitchen. senna-docusate (SENOKOT-S) 8.6-50 MG tablet Take 1 tablet by mouth at bedtime.    . traMADol (ULTRAM) 50 MG tablet Take 1 tablet (50 mg total) by mouth every 6 (six) hours as needed for severe pain. 6 tablet 0   No current facility-administered medications for this visit.     Review of Systems  Constitutional:  Constitutional negative. HENT: HENT negative.  Eyes: Eyes negative.  Respiratory: Respiratory negative.  Cardiovascular: Positive for leg swelling.  GI: Gastrointestinal negative.  Skin: Positive for wound.  Neurological: Neurological negative. Hematologic: Hematologic/lymphatic negative.  Psychiatric: Psychiatric negative.        Objective:  Objective  Vitals:   01/09/19 1418  BP: 119/64  Pulse:  72  Resp: 18  Temp: 98 F (36.7 C)  SpO2: 100%    Physical Exam HENT:     Head: Normocephalic.  Eyes:     Pupils: Pupils are equal, round, and reactive to light.  Neck:     Musculoskeletal: Normal range of motion.  Cardiovascular:     Rate and Rhythm: Normal rate.     Pulses:          Femoral pulses are 2+ on the right side and 2+ on the left side. Pulmonary:     Effort: Pulmonary effort is normal.  Abdominal:     Palpations: Abdomen is soft.  Musculoskeletal:        General: Swelling present.  Skin:    Capillary Refill: Capillary refill takes 2 to 3 seconds.     Comments: Right lateral malleolar 2cm ulceration with clean base  Neurological:     General: No focal deficit present.     Mental Status: He is alert.  Psychiatric:        Mood and Affect: Mood normal.        Behavior: Behavior normal.        Thought Content: Thought content normal.        Judgment: Judgment normal.     Data: I have independently interpreted his ABIs to be monophasic bilaterally with bilateral toe pressures of 0 ABI on the right 0.67 left 0.60     Assessment/Plan:    68 year old male with right lower extremity wound.  Currently being followed by wound care.  Does not have any surrounding erythema no fevers.  Being treated with Santyl.  We will plan angiogram possible right lower extremity intervention given toe pressures of 0.  Hopefully can get him through the pandemic and then get him treated.  Discussed possible endovascular intervention versus need for surgical bypass with patient and his wife.    Brandon HarmanBrandon Christopher Cain MD [facility]

## 2019-01-12 ENCOUNTER — Other Ambulatory Visit: Payer: Self-pay | Admitting: *Deleted

## 2019-01-22 ENCOUNTER — Other Ambulatory Visit: Payer: Self-pay

## 2019-01-22 ENCOUNTER — Ambulatory Visit (HOSPITAL_COMMUNITY)
Admission: RE | Admit: 2019-01-22 | Discharge: 2019-01-22 | Disposition: A | Discharge status: Home / Self Care | Payer: Medicare HMO | Attending: Vascular Surgery | Admitting: Vascular Surgery

## 2019-01-22 ENCOUNTER — Encounter (HOSPITAL_COMMUNITY)
Admission: RE | Disposition: A | Discharge status: Home / Self Care | Payer: Medicare HMO | Source: Home / Self Care | Attending: Vascular Surgery

## 2019-01-22 DIAGNOSIS — I998 Other disorder of circulatory system: Secondary | ICD-10-CM

## 2019-01-22 DIAGNOSIS — S81801A Unspecified open wound, right lower leg, initial encounter: Secondary | ICD-10-CM | POA: Insufficient documentation

## 2019-01-22 DIAGNOSIS — X58XXXA Exposure to other specified factors, initial encounter: Secondary | ICD-10-CM | POA: Insufficient documentation

## 2019-01-22 HISTORY — PX: ABDOMINAL AORTOGRAM W/LOWER EXTREMITY: CATH118223

## 2019-01-22 LAB — POCT I-STAT CREATININE: Creatinine, Ser: 0.8 mg/dL (ref 0.61–1.24)

## 2019-01-22 LAB — GLUCOSE, CAPILLARY
Glucose-Capillary: 58 mg/dL — ABNORMAL LOW (ref 70–99)
Glucose-Capillary: 112 mg/dL — ABNORMAL HIGH (ref 70–99)

## 2019-01-22 LAB — POCT I-STAT 4, (NA,K, GLUC, HGB,HCT)
Glucose, Bld: 81 mg/dL (ref 70–99)
HCT: 35 % — ABNORMAL LOW (ref 39.0–52.0)
Hemoglobin: 11.9 g/dL — ABNORMAL LOW (ref 13.0–17.0)
Potassium: 3.9 mmol/L (ref 3.5–5.1)
Sodium: 140 mmol/L (ref 135–145)

## 2019-01-22 SURGERY — ABDOMINAL AORTOGRAM W/LOWER EXTREMITY
Anesthesia: LOCAL

## 2019-01-22 MED ORDER — MIDAZOLAM HCL 2 MG/2ML IJ SOLN
INTRAMUSCULAR | Status: AC
  Filled 2019-01-22: qty 2

## 2019-01-22 MED ORDER — SODIUM CHLORIDE 0.9% FLUSH: 3.0000 mL | INTRAVENOUS | Status: DC | PRN

## 2019-01-22 MED ORDER — MORPHINE SULFATE (PF) 10 MG/ML IV SOLN: 2.0000 mg | INTRAVENOUS | Status: DC | PRN

## 2019-01-22 MED ORDER — HEPARIN (PORCINE) IN NACL 1000-0.9 UT/500ML-% IV SOLN
INTRAVENOUS | Status: DC | PRN
  Administered 2019-01-22: 500 mL

## 2019-01-22 MED ORDER — ACETAMINOPHEN 325 MG PO TABS: 650.0000 mg | ORAL_TABLET | ORAL | Status: DC | PRN

## 2019-01-22 MED ORDER — FENTANYL CITRATE (PF) 100 MCG/2ML IJ SOLN
INTRAMUSCULAR | Status: AC
  Filled 2019-01-22: qty 2

## 2019-01-22 MED ORDER — DEXTROSE 50 % IV SOLN
25.0000 mL | Freq: Once | INTRAVENOUS | Status: AC
  Administered 2019-01-22: 25 mL via INTRAVENOUS

## 2019-01-22 MED ORDER — DEXTROSE 50 % IV SOLN
INTRAVENOUS | Status: AC
  Administered 2019-01-22: 25 mL via INTRAVENOUS
  Filled 2019-01-22: qty 50

## 2019-01-22 MED ORDER — MIDAZOLAM HCL 2 MG/2ML IJ SOLN
INTRAMUSCULAR | Status: DC | PRN
  Administered 2019-01-22 (×3): 1 mg via INTRAVENOUS

## 2019-01-22 MED ORDER — LIDOCAINE HCL (PF) 1 % IJ SOLN
INTRAMUSCULAR | Status: DC | PRN
  Administered 2019-01-22: 15 mL

## 2019-01-22 MED ORDER — SODIUM CHLORIDE 0.9 % WEIGHT BASED INFUSION: 1.0000 mL/kg/h | INTRAVENOUS | Status: DC

## 2019-01-22 MED ORDER — SODIUM CHLORIDE 0.9% FLUSH: 3.0000 mL | Freq: Two times a day (BID) | INTRAVENOUS | Status: DC

## 2019-01-22 MED ORDER — HEPARIN (PORCINE) IN NACL 1000-0.9 UT/500ML-% IV SOLN
INTRAVENOUS | Status: AC
  Filled 2019-01-22: qty 1000

## 2019-01-22 MED ORDER — ONDANSETRON HCL 4 MG/2ML IJ SOLN: 4.0000 mg | Freq: Four times a day (QID) | INTRAMUSCULAR | Status: DC | PRN

## 2019-01-22 MED ORDER — FENTANYL CITRATE (PF) 100 MCG/2ML IJ SOLN
INTRAMUSCULAR | Status: DC | PRN
  Administered 2019-01-22: 25 ug via INTRAVENOUS
  Administered 2019-01-22 (×2): 50 ug via INTRAVENOUS

## 2019-01-22 MED ORDER — LIDOCAINE HCL (PF) 1 % IJ SOLN
INTRAMUSCULAR | Status: AC
  Filled 2019-01-22: qty 30

## 2019-01-22 MED ORDER — LABETALOL HCL 5 MG/ML IV SOLN: 10.0000 mg | INTRAVENOUS | Status: DC | PRN

## 2019-01-22 MED ORDER — IODIXANOL 320 MG/ML IV SOLN
INTRAVENOUS | Status: DC | PRN
  Administered 2019-01-22: 13:00:00 105 mL via INTRA_ARTERIAL

## 2019-01-22 MED ORDER — OXYCODONE HCL 5 MG PO TABS: 5.0000 mg | ORAL_TABLET | ORAL | Status: DC | PRN

## 2019-01-22 MED ORDER — HYDRALAZINE HCL 20 MG/ML IJ SOLN: 5.0000 mg | INTRAMUSCULAR | Status: DC | PRN

## 2019-01-22 MED ORDER — SODIUM CHLORIDE 0.9 % IV SOLN: 250.0000 mL | INTRAVENOUS | Status: DC | PRN

## 2019-01-22 MED ORDER — SODIUM CHLORIDE 0.9 % IV SOLN
INTRAVENOUS | Status: DC
  Administered 2019-01-22: 09:00:00 via INTRAVENOUS

## 2019-01-22 SURGICAL SUPPLY — 11 items
CATH CROSS OVER TEMPO 5F (CATHETERS) ×1 IMPLANT
CATH OMNI FLUSH 5F 65CM (CATHETERS) ×2 IMPLANT
CLOSURE MYNX CONTROL 5F (Vascular Products) ×1 IMPLANT
KIT MICROPUNCTURE NIT STIFF (SHEATH) ×2 IMPLANT
KIT PV (KITS) ×2 IMPLANT
SHEATH PINNACLE 5F 10CM (SHEATH) ×1 IMPLANT
SHEATH PROBE COVER 6X72 (BAG) ×2 IMPLANT
SYR MEDRAD MARK V 150ML (SYRINGE) ×1 IMPLANT
TRANSDUCER W/STOPCOCK (MISCELLANEOUS) ×2 IMPLANT
TRAY PV CATH (CUSTOM PROCEDURE TRAY) ×2 IMPLANT
WIRE BENTSON .035X145CM (WIRE) ×4 IMPLANT

## 2019-01-22 NOTE — Discharge Instructions (Signed)
Femoral Site Care °This sheet gives you information about how to care for yourself after your procedure. Your health care provider may also give you more specific instructions. If you have problems or questions, contact your health care provider. °What can I expect after the procedure? °After the procedure, it is common to have: °· Bruising that usually fades within 1-2 weeks. °· Tenderness at the site. °Follow these instructions at home: °Wound care °· Follow instructions from your health care provider about how to take care of your insertion site. Make sure you: °? Wash your hands with soap and water before you change your bandage (dressing). If soap and water are not available, use hand sanitizer. °? Change your dressing as told by your health care provider. °? Leave stitches (sutures), skin glue, or adhesive strips in place. These skin closures may need to stay in place for 2 weeks or longer. If adhesive strip edges start to loosen and curl up, you may trim the loose edges. Do not remove adhesive strips completely unless your health care provider tells you to do that. °· Do not take baths, swim, or use a hot tub until your health care provider approves. °· You may shower 24-48 hours after the procedure or as told by your health care provider. °? Gently wash the site with plain soap and water. °? Pat the area dry with a clean towel. °? Do not rub the site. This may cause bleeding. °· Do not apply powder or lotion to the site. Keep the site clean and dry. °· Check your femoral site every day for signs of infection. Check for: °? Redness, swelling, or pain. °? Fluid or blood. °? Warmth. °? Pus or a bad smell. °Activity °· For the first 2-3 days after your procedure, or as long as directed: °? Avoid climbing stairs as much as possible. °? Do not squat. °· Do not lift anything that is heavier than 10 lb (4.5 kg), or the limit that you are told, until your health care provider says that it is safe. °· Rest as  directed. °? Avoid sitting for a long time without moving. Get up to take short walks every 1-2 hours. °· Do not drive for 24 hours if you were given a medicine to help you relax (sedative). °General instructions °· Take over-the-counter and prescription medicines only as told by your health care provider. °· Keep all follow-up visits as told by your health care provider. This is important. °Contact a health care provider if you have: °· A fever or chills. °· You have redness, swelling, or pain around your insertion site. °Get help right away if: °· The catheter insertion area swells very fast. °· You pass out. °· You suddenly start to sweat or your skin gets clammy. °· The catheter insertion area is bleeding, and the bleeding does not stop when you hold steady pressure on the area. °· The area near or just beyond the catheter insertion site becomes pale, cool, tingly, or numb. °These symptoms may represent a serious problem that is an emergency. Do not wait to see if the symptoms will go away. Get medical help right away. Call your local emergency services (911 in the [place] Do not drive yourself to the hospital. °Summary °· After the procedure, it is common to have bruising that usually fades within 1-2 weeks. °· Check your femoral site every day for signs of infection. °· Do not lift anything that is heavier than 10 lb (4.5 kg), or the   limit that you are told, until your health care provider says that it is safe. °This information is not intended to replace advice given to you by your health care provider. Make sure you discuss any questions you have with your health care provider. °Document Released: 05/07/2014 Document Revised: 09/16/2017 Document Reviewed: 09/16/2017 °Elsevier Interactive Patient Education © 2019 [employer] Inc[employer] ° °

## 2019-01-22 NOTE — Progress Notes (Signed)
D/c instructions given to wife, Lupita Leash, via telephone due to COVID19 restrictions.  No questions at this time and Lupita Leash verbalized understanding of instructions

## 2019-01-22 NOTE — H&P (Signed)
   History and Physical Update  The patient was interviewed and re-examined.  The patient's previous History and Physical has been reviewed and is unchanged from recent office visit. Plan for aortogram with runoff and possible intervention today in pv lab.  Brandon Collins C. Randie Heinz, MD [facility] [place] Office: [phone] Pager: [phone]   01/22/2019, 9:15 AM

## 2019-01-22 NOTE — Op Note (Signed)
    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 Randie Heinz, MD Procedure Performed: 1.  Ultrasound-guided cannulation left common femoral artery 2.  Aortogram 3.  Selection of right common femoral artery and right lower extremity angiogram 4.  Minx device closure left common femoral artery 5.  Moderate sedation with fentanyl and Versed for 37 minutes.  Indications: 68 year old male with history of a right lower extremity wound.  He has toe pressure of 0 is indicated for angiogram possible invention.  Findings: Patient has difficulty laying on the table and straightening his legs.  His aorta and iliac segments are free of disease.  Left lower extremity was not evaluated given patient positioning and discomfort.  Right lower extremity common femoral artery appears minimally diseased.  The SFA is heavily diseased from the takeoff where then occludes for a long segment greater than 15 cm reconstitutes above the knee popliteal artery which is still disease.  Below the knee popliteal artery appears diminutive but undiseased.  He is runoff dominant via the peroneal of the ankle and the PT also runs off to the foot.  There is reconstitution of the dorsalis pedis at the level of the foot.   Procedure:  The patient was identified in the holding area and taken to room 8.  The patient was then placed supine on the table and prepped and draped in the usual sterile fashion.  A time out was called.  Ultrasound was used to evaluate the left common femoral artery this was noted to be patent and compressible.  The area was anesthetized 1% lidocaine cannulated with direct ultrasound visualization a micropuncture needle followed the wire and sheath.  Images saved the permanent record.  We placed a Bentson wire followed by 5 [place] sheath.  Omni catheter was placed  to the level of L1 aortogram performed.  I had to use crossover catheter to cross with Bentson wire then exchanged from the catheter.  The right common femoral artery was selected.  We performed right lower extremity angiogram with the above findings.  I elected patient will need bypass given long segment of disease.  He will be scheduled for this.  He tolerated procedure well without immediate complication.  Catheter was removed sheath was kept in place a minx device deployed.  Contrast:105cc    Rodolph Hagemann C. Randie Heinz, MD [facility] [place] Office: [phone] Pager: [phone]

## 2019-01-23 ENCOUNTER — Encounter (HOSPITAL_COMMUNITY): Payer: Self-pay | Admitting: Vascular Surgery

## 2019-01-23 ENCOUNTER — Other Ambulatory Visit: Payer: Self-pay | Admitting: *Deleted

## 2019-01-23 NOTE — Progress Notes (Signed)
Call to patient and wife instructed to be at [facility] admitting at 9 am on 01/27/2019 for surgery. NPO past MN night prior. Expect a call and follow the detailed surgery instructions received from the hospital pre-admission department. Continue aspirin.  Reviewed visitor restrictions, length of stay. Verbalized understanding.

## 2019-01-26 ENCOUNTER — Other Ambulatory Visit: Payer: Self-pay | Admitting: *Deleted

## 2019-01-26 ENCOUNTER — Other Ambulatory Visit (HOSPITAL_COMMUNITY)
Admission: RE | Admit: 2019-01-26 | Discharge: 2019-01-26 | Disposition: A | Discharge status: Home / Self Care | Payer: Medicare HMO | Source: Ambulatory Visit | Attending: Vascular Surgery | Admitting: Vascular Surgery

## 2019-01-26 ENCOUNTER — Other Ambulatory Visit: Payer: Self-pay

## 2019-01-26 ENCOUNTER — Encounter (HOSPITAL_COMMUNITY): Payer: Self-pay | Admitting: *Deleted

## 2019-01-26 DIAGNOSIS — Z1159 Encounter for screening for other viral diseases: Secondary | ICD-10-CM | POA: Diagnosis present

## 2019-01-26 LAB — SARS CORONAVIRUS 2 BY RT PCR (HOSPITAL ORDER, PERFORMED IN ~~LOC~~ HOSPITAL LAB): SARS Coronavirus 2: NEGATIVE

## 2019-01-26 NOTE — Progress Notes (Addendum)
Mr Basciano denies chest pain or shortness of breath. Mr Simoes said that he was confused for a while after surgery last year.  Mrs Eckenrode said that he was confused for 2 weeks, "no one every said why."  Mr. Famiglietti reported that he had some low CBGs while in the hospitalPCP is Dr Jonny Ruiz [facility] in [place]. Mr. Afridi denies that he or his family has experienced any of the following: Cough Fever >100.4 Runny Nose Sore Throat Difficulty breathing/ shortness of breath Travel in past 14 days- none Patient goes for COVID test at [facility] at 1135.  Mr Spaziani has type II diabetes. I instructed patient to hold Glimepirde tonight and Amaryl in am. Patient reports that CBGs run 101- 140. I instructed patient to check CBG after awaking and every 2 hours until arrival  to the hospital.  I Instructed patient if CBG is less than 70 to drink 1/2 cup of a clear juice. Recheck CBG in 15 minutes then call pre- op desk at [phone] for further instructions.

## 2019-01-27 ENCOUNTER — Encounter (HOSPITAL_COMMUNITY)
Admission: RE | Disposition: A | Discharge status: Home Health | Payer: Self-pay | Source: Home / Self Care | Attending: Vascular Surgery

## 2019-01-27 ENCOUNTER — Encounter (HOSPITAL_COMMUNITY): Payer: Self-pay

## 2019-01-27 ENCOUNTER — Other Ambulatory Visit: Payer: Self-pay

## 2019-01-27 ENCOUNTER — Inpatient Hospital Stay (HOSPITAL_COMMUNITY)
Admission: RE | Admit: 2019-01-27 | Discharge: 2019-02-02 | DRG: 271 | Disposition: A | Discharge status: Home Health | Payer: Medicare HMO | Attending: Vascular Surgery | Admitting: Vascular Surgery

## 2019-01-27 ENCOUNTER — Inpatient Hospital Stay (HOSPITAL_COMMUNITY): Payer: Medicare HMO | Admitting: Anesthesiology

## 2019-01-27 DIAGNOSIS — I739 Peripheral vascular disease, unspecified: Secondary | ICD-10-CM | POA: Diagnosis not present

## 2019-01-27 DIAGNOSIS — I7025 Atherosclerosis of native arteries of other extremities with ulceration: Secondary | ICD-10-CM | POA: Diagnosis present

## 2019-01-27 DIAGNOSIS — L97311 Non-pressure chronic ulcer of right ankle limited to breakdown of skin: Secondary | ICD-10-CM | POA: Diagnosis present

## 2019-01-27 DIAGNOSIS — Z87891 Personal history of nicotine dependence: Secondary | ICD-10-CM

## 2019-01-27 DIAGNOSIS — M159 Polyosteoarthritis, unspecified: Secondary | ICD-10-CM | POA: Diagnosis present

## 2019-01-27 DIAGNOSIS — E1151 Type 2 diabetes mellitus with diabetic peripheral angiopathy without gangrene: Principal | ICD-10-CM | POA: Diagnosis present

## 2019-01-27 DIAGNOSIS — Z823 Family history of stroke: Secondary | ICD-10-CM

## 2019-01-27 DIAGNOSIS — E11621 Type 2 diabetes mellitus with foot ulcer: Secondary | ICD-10-CM | POA: Diagnosis present

## 2019-01-27 DIAGNOSIS — Z7984 Long term (current) use of oral hypoglycemic drugs: Secondary | ICD-10-CM

## 2019-01-27 DIAGNOSIS — E11622 Type 2 diabetes mellitus with other skin ulcer: Secondary | ICD-10-CM | POA: Diagnosis present

## 2019-01-27 DIAGNOSIS — L98499 Non-pressure chronic ulcer of skin of other sites with unspecified severity: Secondary | ICD-10-CM | POA: Diagnosis present

## 2019-01-27 DIAGNOSIS — Z981 Arthrodesis status: Secondary | ICD-10-CM

## 2019-01-27 DIAGNOSIS — H9190 Unspecified hearing loss, unspecified ear: Secondary | ICD-10-CM | POA: Diagnosis present

## 2019-01-27 DIAGNOSIS — Z7982 Long term (current) use of aspirin: Secondary | ICD-10-CM | POA: Diagnosis not present

## 2019-01-27 DIAGNOSIS — T148XXA Other injury of unspecified body region, initial encounter: Secondary | ICD-10-CM | POA: Diagnosis present

## 2019-01-27 DIAGNOSIS — Z809 Family history of malignant neoplasm, unspecified: Secondary | ICD-10-CM

## 2019-01-27 DIAGNOSIS — E7800 Pure hypercholesterolemia, unspecified: Secondary | ICD-10-CM | POA: Diagnosis present

## 2019-01-27 DIAGNOSIS — I10 Essential (primary) hypertension: Secondary | ICD-10-CM | POA: Diagnosis present

## 2019-01-27 DIAGNOSIS — I70233 Atherosclerosis of native arteries of right leg with ulceration of ankle: Secondary | ICD-10-CM

## 2019-01-27 HISTORY — DX: Unspecified hearing loss, unspecified ear: H91.90

## 2019-01-27 HISTORY — PX: FEMORAL-POPLITEAL BYPASS GRAFT: SHX937

## 2019-01-27 LAB — COMPREHENSIVE METABOLIC PANEL
ALT: 21 U/L (ref 0–44)
AST: 13 U/L — ABNORMAL LOW (ref 15–41)
Albumin: 3.5 g/dL (ref 3.5–5.0)
Alkaline Phosphatase: 75 U/L (ref 38–126)
Anion gap: 11 (ref 5–15)
BUN: 21 mg/dL (ref 8–23)
CO2: 24 mmol/L (ref 22–32)
Calcium: 10 mg/dL (ref 8.9–10.3)
Chloride: 104 mmol/L (ref 98–111)
Creatinine, Ser: 0.79 mg/dL (ref 0.61–1.24)
GFR calc Af Amer: >60 mL/min (ref >60)
GFR calc non Af Amer: >60 mL/min (ref >60)
Glucose, Bld: 151 mg/dL — ABNORMAL HIGH (ref 70–99)
Potassium: 4 mmol/L (ref 3.5–5.1)
Sodium: 139 mmol/L (ref 135–145)
Total Bilirubin: 0.4 mg/dL (ref 0.3–1.2)
Total Protein: 6.4 g/dL — ABNORMAL LOW (ref 6.5–8.1)

## 2019-01-27 LAB — GLUCOSE, CAPILLARY
Glucose-Capillary: 140 mg/dL — ABNORMAL HIGH (ref 70–99)
Glucose-Capillary: 176 mg/dL — ABNORMAL HIGH (ref 70–99)
Glucose-Capillary: 183 mg/dL — ABNORMAL HIGH (ref 70–99)
Glucose-Capillary: 173 mg/dL — ABNORMAL HIGH (ref 70–99)

## 2019-01-27 LAB — CBC
HCT: 36.9 % — ABNORMAL LOW (ref 39.0–52.0)
Hemoglobin: 11.9 g/dL — ABNORMAL LOW (ref 13.0–17.0)
MCH: 29.1 pg (ref 26.0–34.0)
MCHC: 32.2 g/dL (ref 30.0–36.0)
MCV: 90.2 fL (ref 80.0–100.0)
Platelets: 217 10*3/uL (ref 150–400)
RBC: 4.09 MIL/uL — ABNORMAL LOW (ref 4.22–5.81)
RDW: 13.4 % (ref 11.5–15.5)
WBC: 9.4 10*3/uL (ref 4.0–10.5)
nRBC: 0 % (ref 0.0–0.2)

## 2019-01-27 LAB — PROTIME-INR
INR: 1 (ref 0.8–1.2)
Prothrombin Time: 13.2 seconds (ref 11.4–15.2)

## 2019-01-27 LAB — TYPE AND SCREEN
ABO/RH(D): O POS
Antibody Screen: NEGATIVE

## 2019-01-27 LAB — APTT: aPTT: 32 seconds (ref 24–36)

## 2019-01-27 SURGERY — BYPASS GRAFT FEMORAL-POPLITEAL ARTERY
Anesthesia: General | Site: Leg Upper | Laterality: Right

## 2019-01-27 MED ORDER — PROPOFOL 10 MG/ML IV BOLUS
INTRAVENOUS | Status: AC
  Filled 2019-01-27: qty 20

## 2019-01-27 MED ORDER — HEPARIN SODIUM (PORCINE) 5000 UNIT/ML IJ SOLN
5000.0000 [IU] | Freq: Three times a day (TID) | INTRAMUSCULAR | Status: DC
  Administered 2019-01-28 – 2019-02-02 (×16): 5000 [IU] via SUBCUTANEOUS
  Filled 2019-01-27 (×15): qty 1

## 2019-01-27 MED ORDER — LABETALOL HCL 5 MG/ML IV SOLN: 10.0000 mg | INTRAVENOUS | Status: DC | PRN

## 2019-01-27 MED ORDER — POLYETHYLENE GLYCOL 3350 17 G PO PACK
17.0000 g | PACK | Freq: Every day | ORAL | Status: DC | PRN
  Administered 2019-01-31: 17 g via ORAL
  Filled 2019-01-27: qty 1

## 2019-01-27 MED ORDER — HYDROCHLOROTHIAZIDE 25 MG PO TABS
25.0000 mg | ORAL_TABLET | Freq: Every day | ORAL | Status: DC
  Administered 2019-01-28 – 2019-02-02 (×6): 25 mg via ORAL
  Filled 2019-01-27 (×6): qty 1

## 2019-01-27 MED ORDER — ALUM & MAG HYDROXIDE-SIMETH 200-200-20 MG/5ML PO SUSP: 15.0000 mL | ORAL | Status: DC | PRN

## 2019-01-27 MED ORDER — CEFAZOLIN SODIUM-DEXTROSE 2-4 GM/100ML-% IV SOLN
2.0000 g | Freq: Three times a day (TID) | INTRAVENOUS | Status: AC
  Administered 2019-01-27 – 2019-01-28 (×2): 2 g via INTRAVENOUS
  Filled 2019-01-27 (×2): qty 100

## 2019-01-27 MED ORDER — POTASSIUM CHLORIDE CRYS ER 20 MEQ PO TBCR: 20.0000 meq | EXTENDED_RELEASE_TABLET | Freq: Every day | ORAL | Status: DC | PRN

## 2019-01-27 MED ORDER — ATORVASTATIN CALCIUM 10 MG PO TABS
20.0000 mg | ORAL_TABLET | ORAL | Status: DC
  Administered 2019-01-27 – 2019-02-01 (×4): 20 mg via ORAL
  Filled 2019-01-27 (×5): qty 2

## 2019-01-27 MED ORDER — PROPOFOL 10 MG/ML IV BOLUS
INTRAVENOUS | Status: DC | PRN
  Administered 2019-01-27: 20 mg via INTRAVENOUS
  Administered 2019-01-27: 120 mg via INTRAVENOUS

## 2019-01-27 MED ORDER — HEMOSTATIC AGENTS (NO CHARGE) OPTIME
TOPICAL | Status: DC | PRN
  Administered 2019-01-27: 1 via TOPICAL

## 2019-01-27 MED ORDER — GLIMEPIRIDE 4 MG PO TABS
4.0000 mg | ORAL_TABLET | Freq: Every evening | ORAL | Status: DC
  Administered 2019-01-27 – 2019-01-29 (×3): 4 mg via ORAL
  Filled 2019-01-27 (×5): qty 1

## 2019-01-27 MED ORDER — LISINOPRIL 10 MG PO TABS
20.0000 mg | ORAL_TABLET | Freq: Every day | ORAL | Status: DC
  Administered 2019-01-28 – 2019-02-02 (×5): 20 mg via ORAL
  Filled 2019-01-27 (×7): qty 2

## 2019-01-27 MED ORDER — ACETAMINOPHEN 325 MG PO TABS
325.0000 mg | ORAL_TABLET | ORAL | Status: DC | PRN
  Administered 2019-02-01 – 2019-02-02 (×2): 650 mg via ORAL
  Filled 2019-01-27 (×2): qty 2

## 2019-01-27 MED ORDER — SODIUM CHLORIDE 0.9 % IV SOLN
INTRAVENOUS | Status: AC
  Filled 2019-01-27: qty 1.2

## 2019-01-27 MED ORDER — HYDRALAZINE HCL 20 MG/ML IJ SOLN: 5.0000 mg | INTRAMUSCULAR | Status: DC | PRN

## 2019-01-27 MED ORDER — GUAIFENESIN-DM 100-10 MG/5ML PO SYRP: 15.0000 mL | ORAL_SOLUTION | ORAL | Status: DC | PRN

## 2019-01-27 MED ORDER — DEXAMETHASONE SODIUM PHOSPHATE 10 MG/ML IJ SOLN
INTRAMUSCULAR | Status: DC | PRN
  Administered 2019-01-27: 5 mg via INTRAVENOUS

## 2019-01-27 MED ORDER — MORPHINE SULFATE (PF) 2 MG/ML IV SOLN
2.0000 mg | INTRAVENOUS | Status: DC | PRN
  Administered 2019-01-27 – 2019-02-01 (×12): 2 mg via INTRAVENOUS
  Filled 2019-01-27 (×13): qty 1

## 2019-01-27 MED ORDER — MIDAZOLAM HCL 5 MG/5ML IJ SOLN
INTRAMUSCULAR | Status: DC | PRN
  Administered 2019-01-27: 2 mg via INTRAVENOUS

## 2019-01-27 MED ORDER — INSULIN ASPART 100 UNIT/ML ~~LOC~~ SOLN
0.0000 [IU] | Freq: Three times a day (TID) | SUBCUTANEOUS | Status: DC
  Administered 2019-01-27: 2 [IU] via SUBCUTANEOUS
  Administered 2019-01-28 – 2019-01-31 (×2): 1 [IU] via SUBCUTANEOUS
  Administered 2019-01-31 – 2019-02-01 (×2): 3 [IU] via SUBCUTANEOUS
  Administered 2019-02-01: 07:00:00 1 [IU] via SUBCUTANEOUS
  Administered 2019-02-01: 3 [IU] via SUBCUTANEOUS
  Administered 2019-02-02: 2 [IU] via SUBCUTANEOUS
  Administered 2019-02-02: 1 [IU] via SUBCUTANEOUS

## 2019-01-27 MED ORDER — PIOGLITAZONE HCL 15 MG PO TABS
15.0000 mg | ORAL_TABLET | ORAL | Status: DC
  Administered 2019-01-28 – 2019-02-02 (×6): 15 mg via ORAL
  Filled 2019-01-27 (×7): qty 1

## 2019-01-27 MED ORDER — SODIUM CHLORIDE 0.9 % IV SOLN
INTRAVENOUS | Status: DC | PRN
  Administered 2019-01-27: 500 mL

## 2019-01-27 MED ORDER — ACETAMINOPHEN 325 MG RE SUPP: 325.0000 mg | RECTAL | Status: DC | PRN

## 2019-01-27 MED ORDER — PHENYLEPHRINE HCL (PRESSORS) 10 MG/ML IV SOLN
INTRAVENOUS | Status: DC | PRN
  Administered 2019-01-27: 80 ug via INTRAVENOUS
  Administered 2019-01-27: 40 ug via INTRAVENOUS
  Administered 2019-01-27: 80 ug via INTRAVENOUS

## 2019-01-27 MED ORDER — ONDANSETRON HCL 4 MG/2ML IJ SOLN
INTRAMUSCULAR | Status: AC
  Filled 2019-01-27: qty 2

## 2019-01-27 MED ORDER — PHENYLEPHRINE 40 MCG/ML (10ML) SYRINGE FOR IV PUSH (FOR BLOOD PRESSURE SUPPORT)
PREFILLED_SYRINGE | INTRAVENOUS | Status: AC
  Filled 2019-01-27: qty 10

## 2019-01-27 MED ORDER — VITAMIN D 25 MCG (1000 UNIT) PO TABS
1000.0000 [IU] | ORAL_TABLET | Freq: Every day | ORAL | Status: DC
  Administered 2019-01-27 – 2019-02-02 (×7): 1000 [IU] via ORAL
  Filled 2019-01-27 (×10): qty 1

## 2019-01-27 MED ORDER — PROTAMINE SULFATE 10 MG/ML IV SOLN
INTRAVENOUS | Status: AC
  Filled 2019-01-27: qty 5

## 2019-01-27 MED ORDER — HYDROMORPHONE HCL 1 MG/ML IJ SOLN
0.2500 mg | INTRAMUSCULAR | Status: DC | PRN
  Administered 2019-01-27 (×2): 0.5 mg via INTRAVENOUS

## 2019-01-27 MED ORDER — ALBUMIN HUMAN 5 % IV SOLN
INTRAVENOUS | Status: DC | PRN
  Administered 2019-01-27: 14:00:00 via INTRAVENOUS

## 2019-01-27 MED ORDER — SODIUM CHLORIDE 0.9 % IV SOLN
500.0000 mL | Freq: Once | INTRAVENOUS | Status: AC | PRN
  Administered 2019-01-27 (×2): 500 mL via INTRAVENOUS

## 2019-01-27 MED ORDER — LIDOCAINE 2% (20 MG/ML) 5 ML SYRINGE
INTRAMUSCULAR | Status: AC
  Filled 2019-01-27: qty 5

## 2019-01-27 MED ORDER — PROTAMINE SULFATE 10 MG/ML IV SOLN
INTRAVENOUS | Status: DC | PRN
  Administered 2019-01-27: 25 mg via INTRAVENOUS

## 2019-01-27 MED ORDER — EPHEDRINE SULFATE 50 MG/ML IJ SOLN
INTRAMUSCULAR | Status: DC | PRN
  Administered 2019-01-27 (×2): 10 mg via INTRAVENOUS

## 2019-01-27 MED ORDER — FENTANYL CITRATE (PF) 100 MCG/2ML IJ SOLN
INTRAMUSCULAR | Status: DC | PRN
  Administered 2019-01-27: 50 ug via INTRAVENOUS
  Administered 2019-01-27: 100 ug via INTRAVENOUS
  Administered 2019-01-27 (×3): 50 ug via INTRAVENOUS

## 2019-01-27 MED ORDER — SUCCINYLCHOLINE CHLORIDE 200 MG/10ML IV SOSY
PREFILLED_SYRINGE | INTRAVENOUS | Status: DC | PRN
  Administered 2019-01-27: 100 mg via INTRAVENOUS

## 2019-01-27 MED ORDER — COLLAGENASE 250 UNIT/GM EX OINT
1.0000 "application " | TOPICAL_OINTMENT | Freq: Every day | CUTANEOUS | Status: DC
  Administered 2019-01-29 – 2019-02-02 (×5): 1 via TOPICAL
  Filled 2019-01-27: qty 30

## 2019-01-27 MED ORDER — SODIUM CHLORIDE 0.9 % IV SOLN
INTRAVENOUS | Status: DC
  Administered 2019-01-27: 17:00:00 via INTRAVENOUS

## 2019-01-27 MED ORDER — ONDANSETRON HCL 4 MG/2ML IJ SOLN: 4.0000 mg | Freq: Four times a day (QID) | INTRAMUSCULAR | Status: DC | PRN

## 2019-01-27 MED ORDER — LIDOCAINE 2% (20 MG/ML) 5 ML SYRINGE
INTRAMUSCULAR | Status: DC | PRN
  Administered 2019-01-27: 60 mg via INTRAVENOUS

## 2019-01-27 MED ORDER — EPHEDRINE 5 MG/ML INJ
INTRAVENOUS | Status: AC
  Filled 2019-01-27: qty 10

## 2019-01-27 MED ORDER — METOPROLOL TARTRATE 5 MG/5ML IV SOLN: 2.0000 mg | INTRAVENOUS | Status: DC | PRN

## 2019-01-27 MED ORDER — CEFAZOLIN SODIUM-DEXTROSE 2-4 GM/100ML-% IV SOLN
2.0000 g | INTRAVENOUS | Status: AC
  Administered 2019-01-27: 11:00:00 2 g via INTRAVENOUS
  Filled 2019-01-27: qty 100

## 2019-01-27 MED ORDER — DEXAMETHASONE SODIUM PHOSPHATE 10 MG/ML IJ SOLN
INTRAMUSCULAR | Status: AC
  Filled 2019-01-27: qty 1

## 2019-01-27 MED ORDER — CHLORHEXIDINE GLUCONATE 4 % EX LIQD: 60.0000 mL | Freq: Once | CUTANEOUS | Status: DC

## 2019-01-27 MED ORDER — FENTANYL CITRATE (PF) 250 MCG/5ML IJ SOLN
INTRAMUSCULAR | Status: AC
  Filled 2019-01-27: qty 5

## 2019-01-27 MED ORDER — LACTATED RINGERS IV SOLN
INTRAVENOUS | Status: DC
  Administered 2019-01-27 (×2): via INTRAVENOUS

## 2019-01-27 MED ORDER — BISACODYL 10 MG RE SUPP: 10.0000 mg | Freq: Every day | RECTAL | Status: DC | PRN

## 2019-01-27 MED ORDER — MIDAZOLAM HCL 2 MG/2ML IJ SOLN
INTRAMUSCULAR | Status: AC
  Filled 2019-01-27: qty 2

## 2019-01-27 MED ORDER — SODIUM CHLORIDE 0.9 % IV SOLN: INTRAVENOUS | Status: DC

## 2019-01-27 MED ORDER — HYDROMORPHONE HCL 1 MG/ML IJ SOLN
INTRAMUSCULAR | Status: AC
  Filled 2019-01-27: qty 1

## 2019-01-27 MED ORDER — AMLODIPINE BESYLATE 10 MG PO TABS
10.0000 mg | ORAL_TABLET | Freq: Every day | ORAL | Status: DC
  Administered 2019-01-28 – 2019-02-02 (×5): 10 mg via ORAL
  Filled 2019-01-27 (×6): qty 1

## 2019-01-27 MED ORDER — SUCCINYLCHOLINE CHLORIDE 200 MG/10ML IV SOSY
PREFILLED_SYRINGE | INTRAVENOUS | Status: AC
  Filled 2019-01-27: qty 10

## 2019-01-27 MED ORDER — SODIUM CHLORIDE 0.9 % IV SOLN
INTRAVENOUS | Status: DC | PRN
  Administered 2019-01-27: 12:00:00 25 ug/min via INTRAVENOUS

## 2019-01-27 MED ORDER — 0.9 % SODIUM CHLORIDE (POUR BTL) OPTIME
TOPICAL | Status: DC | PRN
  Administered 2019-01-27: 1000 mL

## 2019-01-27 MED ORDER — HEPARIN SODIUM (PORCINE) 1000 UNIT/ML IJ SOLN
INTRAMUSCULAR | Status: DC | PRN
  Administered 2019-01-27: 5000 [IU] via INTRAVENOUS
  Administered 2019-01-27: 10000 [IU] via INTRAVENOUS

## 2019-01-27 MED ORDER — HEPARIN SODIUM (PORCINE) 1000 UNIT/ML IJ SOLN
INTRAMUSCULAR | Status: AC
  Filled 2019-01-27: qty 1

## 2019-01-27 MED ORDER — DOCUSATE SODIUM 100 MG PO CAPS
100.0000 mg | ORAL_CAPSULE | Freq: Every day | ORAL | Status: DC
  Administered 2019-01-28 – 2019-02-01 (×5): 100 mg via ORAL
  Filled 2019-01-27 (×5): qty 1

## 2019-01-27 MED ORDER — BACLOFEN 10 MG PO TABS
10.0000 mg | ORAL_TABLET | Freq: Three times a day (TID) | ORAL | Status: DC | PRN
  Administered 2019-01-29 – 2019-02-01 (×4): 10 mg via ORAL
  Filled 2019-01-27 (×5): qty 1

## 2019-01-27 MED ORDER — PHENOL 1.4 % MT LIQD: 1.0000 | OROMUCOSAL | Status: DC | PRN

## 2019-01-27 MED ORDER — PANTOPRAZOLE SODIUM 40 MG PO TBEC
40.0000 mg | DELAYED_RELEASE_TABLET | Freq: Every day | ORAL | Status: DC
  Administered 2019-01-27 – 2019-02-01 (×6): 40 mg via ORAL
  Filled 2019-01-27 (×6): qty 1

## 2019-01-27 MED ORDER — MAGNESIUM SULFATE 2 GM/50ML IV SOLN: 2.0000 g | Freq: Every day | INTRAVENOUS | Status: DC | PRN

## 2019-01-27 MED ORDER — TRAMADOL HCL 50 MG PO TABS
50.0000 mg | ORAL_TABLET | Freq: Four times a day (QID) | ORAL | Status: DC | PRN
  Administered 2019-01-27 – 2019-02-02 (×14): 50 mg via ORAL
  Filled 2019-01-27 (×14): qty 1

## 2019-01-27 MED ORDER — ASPIRIN EC 81 MG PO TBEC
81.0000 mg | DELAYED_RELEASE_TABLET | Freq: Every day | ORAL | Status: DC
  Administered 2019-01-28 – 2019-02-02 (×6): 81 mg via ORAL
  Filled 2019-01-27 (×6): qty 1

## 2019-01-27 MED ORDER — ACETAMINOPHEN 500 MG PO TABS: 1000.0000 mg | ORAL_TABLET | Freq: Once | ORAL | Status: DC

## 2019-01-27 MED ORDER — DICLOFENAC SODIUM 1 % TD GEL
1.0000 "application " | Freq: Three times a day (TID) | TRANSDERMAL | Status: DC | PRN
  Filled 2019-01-27: qty 100

## 2019-01-27 SURGICAL SUPPLY — 66 items
ADH SKN CLS APL DERMABOND .7 (GAUZE/BANDAGES/DRESSINGS) ×1
BANDAGE ELASTIC 4 VELCRO ST LF (GAUZE/BANDAGES/DRESSINGS) ×1 IMPLANT
BANDAGE ESMARK 6X9 LF (GAUZE/BANDAGES/DRESSINGS) IMPLANT
BNDG CMPR 9X6 STRL LF SNTH (GAUZE/BANDAGES/DRESSINGS)
BNDG ESMARK 6X9 LF (GAUZE/BANDAGES/DRESSINGS)
CANISTER SUCT 3000ML PPV (MISCELLANEOUS) ×2 IMPLANT
CANNULA VESSEL 3MM 2 BLNT TIP (CANNULA) ×1 IMPLANT
CATH EMB 3FR 40CM (CATHETERS) ×2 IMPLANT
CLIP VESOCCLUDE MED 24/CT (CLIP) ×2 IMPLANT
CLIP VESOCCLUDE SM WIDE 24/CT (CLIP) ×4 IMPLANT
COVER PROBE W GEL 5X96 (DRAPES) ×1 IMPLANT
COVER SURGICAL LIGHT HANDLE (MISCELLANEOUS) ×1 IMPLANT
COVER WAND RF STERILE (DRAPES) IMPLANT
CUFF TOURNIQUET SINGLE 24IN (TOURNIQUET CUFF) IMPLANT
CUFF TOURNIQUET SINGLE 34IN LL (TOURNIQUET CUFF) IMPLANT
CUFF TOURNIQUET SINGLE 44IN (TOURNIQUET CUFF) IMPLANT
DERMABOND ADVANCED (GAUZE/BANDAGES/DRESSINGS) ×1
DERMABOND ADVANCED .7 DNX12 (GAUZE/BANDAGES/DRESSINGS) ×1 IMPLANT
DRAIN CHANNEL 15F RND FF W/TCR (WOUND CARE) IMPLANT
DRAPE C-ARM 42X72 X-RAY (DRAPES) IMPLANT
DRAPE HALF SHEET 40X57 (DRAPES) IMPLANT
DRAPE SHEET LG 3/4 BI-LAMINATE (DRAPES) ×2 IMPLANT
DRAPE X-RAY CASS 24X20 (DRAPES) IMPLANT
DRSG COVADERM 4X10 (GAUZE/BANDAGES/DRESSINGS) ×2 IMPLANT
DRSG MEPILEX BORDER 4X4 (GAUZE/BANDAGES/DRESSINGS) ×1 IMPLANT
ELECT REM PT RETURN 9FT ADLT (ELECTROSURGICAL) ×2
ELECTRODE REM PT RTRN 9FT ADLT (ELECTROSURGICAL) ×1 IMPLANT
EVACUATOR SILICONE 100CC (DRAIN) IMPLANT
GLOVE BIO SURGEON STRL SZ7.5 (GLOVE) ×2 IMPLANT
GOWN STRL REUS W/ TWL LRG LVL3 (GOWN DISPOSABLE) ×2 IMPLANT
GOWN STRL REUS W/ TWL XL LVL3 (GOWN DISPOSABLE) ×1 IMPLANT
GOWN STRL REUS W/TWL LRG LVL3 (GOWN DISPOSABLE) ×4
GOWN STRL REUS W/TWL XL LVL3 (GOWN DISPOSABLE) ×2
GRAFT PROPATEN W/RING 6X80X60 (Vascular Products) ×1 IMPLANT
HEMOSTAT SNOW SURGICEL 2X4 (HEMOSTASIS) ×2 IMPLANT
INSERT FOGARTY SM (MISCELLANEOUS) IMPLANT
KIT BASIN OR (CUSTOM PROCEDURE TRAY) ×2 IMPLANT
KIT TURNOVER KIT B (KITS) ×2 IMPLANT
LOOP VESSEL MINI RED (MISCELLANEOUS) ×2 IMPLANT
MARKER GRAFT CORONARY BYPASS (MISCELLANEOUS) IMPLANT
NS IRRIG 1000ML POUR BTL (IV SOLUTION) ×4 IMPLANT
PACK PERIPHERAL VASCULAR (CUSTOM PROCEDURE TRAY) ×2 IMPLANT
PAD ARMBOARD 7.5X6 YLW CONV (MISCELLANEOUS) ×4 IMPLANT
SET COLLECT BLD 21X3/4 12 (NEEDLE) IMPLANT
SLEEVE SURGEON STRL (DRAPES) ×1 IMPLANT
SPONGE LAP 18X18 RF (DISPOSABLE) ×2 IMPLANT
STAPLER VISISTAT 35W (STAPLE) ×1 IMPLANT
STOPCOCK 4 WAY LG BORE MALE ST (IV SETS) IMPLANT
SUT ETHILON 3 0 PS 1 (SUTURE) IMPLANT
SUT MNCRL AB 4-0 PS2 18 (SUTURE) ×4 IMPLANT
SUT PROLENE 5 0 C 1 24 (SUTURE) ×6 IMPLANT
SUT PROLENE 6 0 BV (SUTURE) ×10 IMPLANT
SUT PROLENE 7 0 BV 1 (SUTURE) IMPLANT
SUT SILK 2 0 SH (SUTURE) ×2 IMPLANT
SUT SILK 3 0 (SUTURE) ×2
SUT SILK 3-0 18XBRD TIE 12 (SUTURE) ×1 IMPLANT
SUT VIC AB 2-0 CT1 27 (SUTURE) ×8
SUT VIC AB 2-0 CT1 TAPERPNT 27 (SUTURE) ×4 IMPLANT
SUT VIC AB 3-0 SH 27 (SUTURE) ×8
SUT VIC AB 3-0 SH 27X BRD (SUTURE) ×4 IMPLANT
SYR 20CC LL (SYRINGE) ×2 IMPLANT
SYR 3ML LL SCALE MARK (SYRINGE) ×2 IMPLANT
TOWEL GREEN STERILE (TOWEL DISPOSABLE) ×2 IMPLANT
TRAY FOLEY MTR SLVR 16FR STAT (SET/KITS/TRAYS/PACK) ×2 IMPLANT
UNDERPAD 30X30 (UNDERPADS AND DIAPERS) ×2 IMPLANT
WATER STERILE IRR 1000ML POUR (IV SOLUTION) ×2 IMPLANT

## 2019-01-27 NOTE — Progress Notes (Signed)
  Progress Note    01/27/2019 3:19 PM Day of Surgery  Subjective:  No complaints    Vitals:   01/27/19 1457 01/27/19 1512  BP: (!) 98/55 (!) 90/59  Pulse: 89 73  Resp: (!) 22 12  Temp: (!) 97 F (36.1 C)   SpO2: 100% 100%    Physical Exam: general:  No distress Lungs:  Non labored Incisions:  Left groin and other incisions are clean and dry.  Lower leg incisions covered by ace wrap, which is clean and dry. Extremities:  Brisk right PT doppler signal.  Monophasic right DP signal   CBC    Component Value Date/Time   WBC 9.4 01/27/2019 0913   RBC 4.09 (L) 01/27/2019 0913   HGB 11.9 (L) 01/27/2019 0913   HCT 36.9 (L) 01/27/2019 0913   PLT 217 01/27/2019 0913   MCV 90.2 01/27/2019 0913   MCH 29.1 01/27/2019 0913   MCHC 32.2 01/27/2019 0913   RDW 13.4 01/27/2019 0913   LYMPHSABS 1.6 08/20/2018 0607   MONOABS 2.3 (H) 08/20/2018 0607   EOSABS 0.1 08/20/2018 0607   BASOSABS 0.1 08/20/2018 0607    BMET    Component Value Date/Time   NA 139 01/27/2019 0913   K 4.0 01/27/2019 0913   CL 104 01/27/2019 0913   CO2 24 01/27/2019 0913   GLUCOSE 151 (H) 01/27/2019 0913   BUN 21 01/27/2019 0913   CREATININE 0.79 01/27/2019 0913   CALCIUM 10.0 01/27/2019 0913   GFRNONAA >60 01/27/2019 0913   GFRAA >60 01/27/2019 0913    INR    Component Value Date/Time   INR 1.0 01/27/2019 0913     Intake/Output Summary (Last 24 hours) at 01/27/2019 1519 Last data filed at 01/27/2019 1447 Gross per 24 hour  Intake 1650 ml  Output 600 ml  Net 1050 ml     Assessment:  68 y.o. male is s/p:  Right femoral to popliteal bypass graft with PTFE  Day of Surgery  Plan: -pt doing well in pacu with patent bypass graft -to 4 east later this afternoon -DVT prophylaxis: sq heparin to start tomorrow afternoon.   Doreatha Massed, [name] [facility] [phone] 01/27/2019 3:19 PM

## 2019-01-27 NOTE — Transfer of Care (Signed)
Immediate Anesthesia Transfer of Care Note  Patient: Brandon Collins  Procedure(s) Performed: right FEMORAL ENDARTERECTOMY, RIGHT  FEMORAL- BELOW THE KNEE POPLITEAL ARTERY BYPASS GRAFT using 47mm propaten ringed gore graft, attempted right femoral to below knee popliteal artery bypass using right nonreversed great saphenous vein (Right Leg Upper)  Patient Location: PACU  Anesthesia Type:General  Level of Consciousness: drowsy and patient cooperative  Airway & Oxygen Therapy: Patient Spontanous Breathing and Patient connected to nasal cannula oxygen  Post-op Assessment: Report given to RN, Post -op Vital signs reviewed and stable and Patient moving all extremities  Post vital signs: Reviewed and stable  Last Vitals:  Vitals Value Taken Time  BP 98/55 01/27/2019  2:57 PM  Temp    Pulse 80 01/27/2019  3:00 PM  Resp 17 01/27/2019  3:00 PM  SpO2 100 % 01/27/2019  3:00 PM  Vitals shown include unvalidated device data.  Last Pain:  Vitals:   01/27/19 0920  TempSrc:   PainSc: 4          Complications: No apparent anesthesia complications

## 2019-01-27 NOTE — Progress Notes (Signed)
Patient came in for surgery with abrasion on left knee.  Patient stated he was trying to transfer from couch to wheelchair in the middle of the night and his chair shifted.  Dressing placed over abrasion.

## 2019-01-27 NOTE — Op Note (Signed)
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 Randie Heinz, MD Assistant: Doreatha Massed, PA Procedure Performed: 1.  Right external iliac artery, common femoral artery and profunda femoris artery endarterectomy with vein patch angioplasty 2.  Harvest of right greater saphenous vein 3.  Right common femoral to below-knee popliteal artery bypass with 6 mm ringed PTFE  Indications: 68 year old male history of right lower extremity ulceration was found to have ABI that is monophasic in the 0.6 range toe pressure of 0.  He has undergone angiography is now indicated for femoral to popliteal artery bypass with possible femoral endarterectomy.  Findings: Common femoral artery was subtotally occluded heavily calcified.  This extended up onto the external iliac artery.  After endarterectomy we used an accessory greater saphenous vein for patch angioplasty.  Saphenous vein was harvested from common femoral artery to below-knee knee unfortunately had 2 areas of sclerosis this was not suitable.  The bypass comes off of the common femoral vein patch there is a hood of a bypass vein and interposition PTFE that is sewn directly to the below-knee popliteal artery which was healthy.  At completion there was strong posterior tibial signal at the ankle that did augment with compression of the graft.   Procedure:  The patient was identified in the holding area and taken to the operating room where he was put supine operative general anesthesia induced.  He was sterilely prepped draped the right lower extremity in the usual fashion antibiotics were administered and timeout was called.  We began using ultrasound to identify the common femoral artery this was marked in the skin.  We also identified the greater saphenous vein he did appear quite diminutive in the mid  and distal thigh but we elected to attempt harvest anyway.  Transverse incision was made we dissected down to the common femoral artery.  There was no pulsation in this..  Identified the profunda femoris artery placed a vessel loop around this as well as the SFA.  Medial circumflex artery was looped.  We dissected up onto the external neck artery placed loops around the branches.  We finally got to an area where there was pulsatility appeared safer clamping on the external leg artery.  The crossing vein had to be divided for this closure.  Placed a vessel loop around the external iliac artery.  Through the same incision we identified the greater saphenous vein which was large at that site.  We then dissected this through to skip incisions to above the knee taking branches tween clips and ties.  Below the knee we made 2 separate incisions.  Ultimately the vein was quite diminutive distally this was transected.  We then completed dissecting it out fully.  Was clamped with a side-biting clamp, femoral vein and removed.  Saphenofemoral junction was oversewn with 5-0 Prolene suture in running mattress fashion.  Vein was prepared on the back table noted to have 2 areas of sclerosis.  Concomitantly I dissected out the below-knee popliteal artery which was noted to be soft amenable for clamping.  A tunneler was placed between the 2 sites.  Patient was fully heparinized at this time.  I clamped the profunda and SFA followed by the external neck artery and open common femoral artery longitudinally.  Extensive endarterectomy was performed including into the SFA down to the profunda until there was good backbleeding.  Endarterectomy was  taken up onto the external iliac artery where the artery wall did appear quite thin.  I used a separate incision to harvest an accessory saphenous vein branch that had been identified during her previous dissection.  This vein was then opened longitudinally.  This vein was sewn as a vein patch  angioplasty.  Upon completion of the anastomosis we did have good pulsatility there was some thinning of the artery wall that had to be repaired interrupted suture.  We then used our saphenous vein remove the first valve.  The common femoral artery again was reclamped.  We opened our patch longitudinally and sewed our vein end-to-side with 6-0 Prolene suture.  Upon completion we then flushed through our vein bypass graft.  We used a valvulotome to lyse the valves that have some antegrade bleeding.  There were 2 areas of sclerosis I attempted to use a 3 Fogarty to dilate these this did tear the vein.  I then elected that this was not going to be a suitable vein.  The vein was then clamped just after the anastomosis.  I transected it after 2 cm.  I then tunneled a 6 mm ringed PTFE graft.  I sewed it end-to-end to the vein bypass in the groin wound with 6-0 Prolene suture.  Upon completion we flushed through the bypass graft there was good strong pulsatility to the below-knee pop incision.  The graft was then clamped and flushed with heparinized saline.  Below the knee we clamped the popliteal artery distally proximally opened longitudinally.  There was disease but it was all soft.  The leg was attempted to be straighten as much as possible the graft was trimmed to size and sewn end-to-side with 6-0 Prolene suture.  This time prior to the anastomosis without flushing all directions.  Upon completion there was good pulsatility in the below-knee popliteal incision.  There was strong signal at the posterior tibial at the ankle which was nearly absent with compression of the graft.  Satisfied with this we administered 25 mg of protamine.  We obtained hemostasis irrigated the wounds closed in layers with Vicryl Monocryl above the knee.  Below the knee we closed the incisions with staples given the amount of excess fluid he had in his leg.  He was then away from anesthesia having tolerated procedure without immediate  complication.  All counts were correct at completion.  EBL 250 cc   [place] C. Randie Heinzain, MD [facility] [place] Office: [phone] Pager: [phone]

## 2019-01-27 NOTE — Anesthesia Postprocedure Evaluation (Signed)
Anesthesia Post Note  Patient: Brandon Collins  Procedure(s) Performed: right FEMORAL ENDARTERECTOMY, RIGHT  FEMORAL- BELOW THE KNEE POPLITEAL ARTERY BYPASS GRAFT using 30mm propaten ringed gore graft, attempted right femoral to below knee popliteal artery bypass using right nonreversed great saphenous vein (Right Leg Upper)     Patient location during evaluation: PACU Anesthesia Type: General Level of consciousness: awake and alert Pain management: pain level controlled Vital Signs Assessment: post-procedure vital signs reviewed and stable Respiratory status: spontaneous breathing, nonlabored ventilation, respiratory function stable and patient connected to nasal cannula oxygen Cardiovascular status: blood pressure returned to baseline and stable Postop Assessment: no apparent nausea or vomiting Anesthetic complications: no    Last Vitals:  Vitals:   01/27/19 1512 01/27/19 1530  BP: (!) 90/59 (!) 111/53  Pulse: 73 74  Resp: 12 14  Temp:    SpO2: 100% 100%    Last Pain:  Vitals:   01/27/19 1534  TempSrc:   PainSc: 5                  Ariellah Faust,W. EDMOND

## 2019-01-27 NOTE — H&P (Signed)
HPI:  Brandon OchsCarl S Collins is a 68 y.o. male with history of recent neck surgery.  Has history of diabetes was previously a smoker but quit at the birth of his grandson.  Currently does not walk but did walk prior to this procedure.  Now has wound on his right lateral ankle.  Is never had DVT.  Does have swelling of his legs worse with dependent position.  [place] at [place] care center in [place].  Has not had fevers or chills.  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      Past Medical History:  Diagnosis Date  . Arthritis    "all over" (08/12/2018)  . High cholesterol   . Hypertension   . Type II diabetes mellitus (HCC)         Family History  Problem Relation Age of Onset  . Cancer Mother   . Stroke Father        multiple        Past Surgical History:  Procedure Laterality Date  . ANTERIOR CERVICAL DECOMP/DISCECTOMY FUSION  08/12/2018   CERVICAL 3-4, CERVICAL 4-5, CERVICAL 5-6, CERVICAL 6-7 WITH INSTRUMENTATION AND ALLOGRAFT  . ANTERIOR CERVICAL DECOMPRESSION/DISCECTOMY FUSION 4 LEVELS N/A 08/12/2018   Procedure: ANTERIOR CERVICAL DECOMPRESSION FUSION CERVICAL 3-4, CERVICAL 4-5, CERVICAL 5-6, CERVICAL 6-7 WITH INSTRUMENTATION AND ALLOGRAFT;  Surgeon: Estill Bambergumonski, Mark, MD;  Location: [facility] OR;  Service: Orthopedics;  Laterality: N/A;  ANTERIOR CERVICAL DECOMPRESSION FUSION CERVICAL 3-4, CERVICAL 4-5, CERVICAL 5-6, CERVICAL 6-7 WITH INSTRUMENTATION AND ALLOGRAFT  . BACK SURGERY    . INGUINAL HERNIA REPAIR  1958   "? side"  . KNEE ARTHROSCOPY Right   . PILONIDAL CYST EXCISION      Short Social History:  Social History        Tobacco Use  . Smoking status: Former Smoker    Packs/day: 1.00    Years: 30.00    Pack years: 30.00    Types: Cigarettes  . Smokeless tobacco: Never Used  . Tobacco comment: 08/12/2018 "nothing in the 2000s"  Substance Use Topics  . Alcohol use:  Not Currently    Comment: 08/12/2018 "nothing in the 2000s"    No Known Allergies        Current Outpatient Medications  Medication Sig Dispense Refill  . acetaminophen (TYLENOL) 325 MG tablet Take 2 tablets (650 mg total) by mouth every 4 (four) hours as needed for mild pain ((score 1 to 3) or temp > 100.5).    Marland Kitchen. amLODipine (NORVASC) 10 MG tablet Take 10 mg by mouth daily.    Marland Kitchen. atorvastatin (LIPITOR) 20 MG tablet Take 20 mg by mouth 4 (four) times a week. Evern BioSun, Tues, Thur, and Sat  1  . baclofen (LIORESAL) 10 MG tablet Take 1 tablet (10 mg total) by mouth 3 (three) times daily as needed for muscle spasms. 30 each 0  . cholecalciferol (VITAMIN D) 1000 units tablet Take 1 tablet (1,000 Units total) by mouth daily.    . cloNIDine (CATAPRES) 0.1 MG tablet Take 0.1 mg by mouth 2 (two) times daily.    . collagenase (SANTYL) ointment Apply 1 application topically daily. To right ankle 30 g 2  . glimepiride (AMARYL) 4 MG tablet Take 1 tablet (4 mg total) by mouth daily with breakfast.    . pantoprazole (PROTONIX) 40 MG tablet Take 1 tablet (40 mg total) by mouth daily with supper.    . pioglitazone (ACTOS) 15 MG  tablet Take 15 mg by mouth daily.    Marland Kitchen senna-docusate (SENOKOT-S) 8.6-50 MG tablet Take 1 tablet by mouth at bedtime.    . traMADol (ULTRAM) 50 MG tablet Take 1 tablet (50 mg total) by mouth every 6 (six) hours as needed for severe pain. 6 tablet 0   No current facility-administered medications for this visit.     Review of Systems  Constitutional:  Constitutional negative. HENT: HENT negative.  Eyes: Eyes negative.  Respiratory: Respiratory negative.  Cardiovascular: Positive for leg swelling.  GI: Gastrointestinal negative.  Skin: Positive for wound.  Neurological: Neurological negative. Hematologic: Hematologic/lymphatic negative.  Psychiatric: Psychiatric negative.        Objective:   Vitals:   01/27/19 0905  BP: 127/71  Pulse: 83  Resp:  18  Temp: 98.2 F (36.8 C)  SpO2: 100%    Physical Exam HENT:     Head: Normocephalic.  Eyes:     Pupils: Pupils are equal, round, and reactive to light.  Neck:     Musculoskeletal: Normal range of motion.  Cardiovascular:     Rate and Rhythm: Normal rate.     Pulses:          Femoral pulses are 2+ on the right side and 2+ on the left side. Pulmonary:     Effort: Pulmonary effort is normal.  Abdominal:     Palpations: Abdomen is soft.  Musculoskeletal:        General: Swelling present.  Skin:    Capillary Refill: Capillary refill takes 2 to 3 seconds.     Comments: Right lateral malleolar 2cm ulceration with clean base  Neurological:     General: No focal deficit present.     Mental Status: He is alert.  Psychiatric:        Mood and Affect: Mood normal.        Behavior: Behavior normal.        Thought Content: Thought content normal.        Judgment: Judgment normal.     Data: I have independently interpreted his ABIs to be monophasic bilaterally with bilateral toe pressures of 0 ABI on the right 0.67 left 0.60  I have again reviewed his angiogram we will plan for possible right common femoral endarterectomy with femoral to below the knee popliteal artery bypass graft.     Assessment/Plan:    68 year old male with right lower extremity wound.  Currently being followed by wound care and is treated with Santyl.  He is undergone angiography will now need femoropopliteal bypass with possible femoral endarterectomy on the right.  I discussed risk benefits alternatives we will proceed today.  Maeola Harman MD [facility]

## 2019-01-27 NOTE — Anesthesia Preprocedure Evaluation (Addendum)
Anesthesia Evaluation  Patient identified by MRN, date of birth, ID band Patient awake    Reviewed: Allergy & Precautions, H&P , NPO status , Patient's Chart, lab work & pertinent test results  Airway Mallampati: II  TM Distance: >3 FB Neck ROM: Full    Dental no notable dental hx. (+) Teeth Intact, Dental Advisory Given   Pulmonary neg pulmonary ROS, former smoker,    Pulmonary exam normal breath sounds clear to auscultation       Cardiovascular hypertension, Pt. on medications + Peripheral Vascular Disease   Rhythm:Regular Rate:Normal     Neuro/Psych negative neurological ROS  negative psych ROS   GI/Hepatic negative GI ROS, Neg liver ROS,   Endo/Other  diabetes, Type 2, Oral Hypoglycemic Agents  Renal/GU negative Renal ROS  negative genitourinary   Musculoskeletal  (+) Arthritis , Osteoarthritis,    Abdominal   Peds  Hematology negative hematology ROS (+)   Anesthesia Other Findings   Reproductive/Obstetrics negative OB ROS                            Anesthesia Physical Anesthesia Plan  ASA: III  Anesthesia Plan: General   Post-op Pain Management:    Induction: Intravenous  PONV Risk Score and Plan: 3 and Ondansetron, Dexamethasone and Midazolam  Airway Management Planned: Oral ETT  Additional Equipment:   Intra-op Plan:   Post-operative Plan: Extubation in OR  Informed Consent: I have reviewed the patients History and Physical, chart, labs and discussed the procedure including the risks, benefits and alternatives for the proposed anesthesia with the patient or authorized representative who has indicated his/her understanding and acceptance.     Dental advisory given  Plan Discussed with: CRNA  Anesthesia Plan Comments:         Anesthesia Quick Evaluation

## 2019-01-27 NOTE — Discharge Instructions (Signed)
 [facility] [place]  Discharge instructions  Lower Extremity Bypass Surgery  Please refer to the following instruction for your post-procedure care. Your surgeon or physician assistant will discuss any changes with you.  Activity  You are encouraged to walk as much as you can. You can slowly return to normal activities during the month after your surgery. Avoid strenuous activity and heavy lifting until your doctor tells you it's OK. Avoid activities such as vacuuming or swinging a golf club. Do not drive until your doctor give the OK and you are no longer taking prescription pain medications. It is also normal to have difficulty with sleep habits, eating and bowel movement after surgery. These will go away with time.  Bathing/Showering  Shower daily after you go home. Do not soak in a bathtub, hot tub, or swim until the incision heals completely.  Incision Care  Clean your incision with mild soap and water. Shower every day. Pat the area dry with a clean towel. You do not need a bandage unless otherwise instructed. Do not apply any ointments or creams to your incision. If you have open wounds you will be instructed how to care for them or a visiting nurse may be arranged for you. If you have staples or sutures along your incision they will be removed at your post-op appointment. You may have skin glue on your incision. Do not peel it off. It will come off on its own in about one week.  Wash the groin wound with soap and water daily and pat dry. (No tub bath-only shower)  Then put a dry gauze or washcloth in the groin to keep this area dry to help prevent wound infection.  Do this daily and as needed.  Do not use Vaseline or neosporin on your incisions.  Only use soap and water on your incisions and then protect and keep dry.  Diet  Resume your normal diet. There are no special food restrictions following this procedure. A low fat/ low cholesterol diet is  recommended for all patients with vascular disease. In order to heal from your surgery, it is CRITICAL to get adequate nutrition. Your body requires vitamins, minerals, and protein. Vegetables are the best source of vitamins and minerals. Vegetables also provide the perfect balance of protein. Processed food has little nutritional value, so try to avoid this.  Medications  Resume taking all your medications unless your doctor or physician assistant tells you not to. If your incision is causing pain, you may take over-the-counter pain relievers such as acetaminophen (Tylenol). If you were prescribed a stronger pain medication, please aware these medication can cause nausea and constipation. Prevent nausea by taking the medication with a snack or meal. Avoid constipation by drinking plenty of fluids and eating foods with high amount of fiber, such as fruits, vegetables, and grains. Take Colace 100 mg (an over-the-counter stool softener) twice a day as needed for constipation.  Do not take Tylenol if you are taking prescription pain medications.  Follow Up  Our office will schedule a follow up appointment 2-3 weeks following discharge.  Please call us immediately for any of the following conditions  Severe or worsening pain in your legs or feet while at rest or while walking Increase pain, redness, warmth, or drainage (pus) from your incision site(s) Fever of 101 degree or higher The swelling in your leg with the bypass suddenly worsens and becomes more painful than when you were in the hospital If you have   been instructed to feel your graft pulse then you should do so every day. If you can no longer feel this pulse, call the office immediately. Not all patients are given this instruction.  Leg swelling is common after leg bypass surgery.  The swelling should improve over a few months following surgery. To improve the swelling, you may elevate your legs above the level of your heart while you are  sitting or resting. Your surgeon or physician assistant may ask you to apply an ACE wrap or wear compression (TED) stockings to help to reduce swelling.  Reduce your risk of vascular disease  Stop smoking. If you would like help call [facility] at 1-800-QUIT-NOW (1-800-784-8669) or [place] at 336-586-4000.  Manage your cholesterol Maintain a desired weight Control your diabetes weight Control your diabetes Keep your blood pressure down  If you have any questions, please call the office at [phone]  

## 2019-01-27 NOTE — Anesthesia Procedure Notes (Signed)
Procedure Name: Intubation Date/Time: 01/27/2019 10:49 AM Performed by: Moshe Salisbury, CRNA Pre-anesthesia Checklist: Patient identified, Emergency Drugs available, Suction available and Patient being monitored Patient Re-evaluated:Patient Re-evaluated prior to induction Oxygen Delivery Method: Circle System Utilized Preoxygenation: Pre-oxygenation with 100% oxygen Induction Type: IV induction and Rapid sequence Laryngoscope Size: Mac and 4 Grade View: Grade I Tube type: Oral Tube size: 8.0 mm Number of attempts: 1 Airway Equipment and Method: Stylet Placement Confirmation: ETT inserted through vocal cords under direct vision,  positive ETCO2 and breath sounds checked- equal and bilateral Secured at: 22 cm Tube secured with: Tape Dental Injury: Teeth and Oropharynx as per pre-operative assessment

## 2019-01-28 ENCOUNTER — Encounter (HOSPITAL_COMMUNITY): Payer: Self-pay | Admitting: Vascular Surgery

## 2019-01-28 LAB — BASIC METABOLIC PANEL
Anion gap: 14 (ref 5–15)
BUN: 24 mg/dL — ABNORMAL HIGH (ref 8–23)
CO2: 22 mmol/L (ref 22–32)
Calcium: 8.7 mg/dL — ABNORMAL LOW (ref 8.9–10.3)
Chloride: 102 mmol/L (ref 98–111)
Creatinine, Ser: 0.99 mg/dL (ref 0.61–1.24)
GFR calc Af Amer: >60 mL/min (ref >60)
GFR calc non Af Amer: >60 mL/min (ref >60)
Glucose, Bld: 94 mg/dL (ref 70–99)
Potassium: 3.6 mmol/L (ref 3.5–5.1)
Sodium: 138 mmol/L (ref 135–145)

## 2019-01-28 LAB — CBC
HCT: 24.5 % — ABNORMAL LOW (ref 39.0–52.0)
Hemoglobin: 8.2 g/dL — ABNORMAL LOW (ref 13.0–17.0)
MCH: 28.8 pg (ref 26.0–34.0)
MCHC: 33.5 g/dL (ref 30.0–36.0)
MCV: 86 fL (ref 80.0–100.0)
Platelets: 186 10*3/uL (ref 150–400)
RBC: 2.85 MIL/uL — ABNORMAL LOW (ref 4.22–5.81)
RDW: 13.2 % (ref 11.5–15.5)
WBC: 7.3 10*3/uL (ref 4.0–10.5)
nRBC: 0 % (ref 0.0–0.2)

## 2019-01-28 LAB — GLUCOSE, CAPILLARY
Glucose-Capillary: 83 mg/dL (ref 70–99)
Glucose-Capillary: 92 mg/dL (ref 70–99)
Glucose-Capillary: 144 mg/dL — ABNORMAL HIGH (ref 70–99)
Glucose-Capillary: 105 mg/dL — ABNORMAL HIGH (ref 70–99)

## 2019-01-28 NOTE — Progress Notes (Addendum)
[facility] [place]  Subjective  - Appears slightly confused.  Nursing staff states he did not sleep at all last night.   Objective (!) 114/50 88 97.9 F (36.6 C) (Oral) 14 97%  Intake/Output Summary (Last 24 hours) at 01/28/2019 0757 Last data filed at 01/28/2019 0000 Gross per 24 hour  Intake 2218.45 ml  Output 1150 ml  Net 1068.45 ml    Right LE incision healing well, left the ace wrap in place. Brisk DP/PT right LE signals Right groin soft without hematoma Heart RRR Lungs non labored breathing Gen slow talking and slightly confused  Assessment/Planning: POD #1  1.  Right external iliac artery, common femoral artery and profunda femoris artery endarterectomy with vein patch angioplasty 2.  Harvest of right greater saphenous vein 3.  Right common femoral to below-knee popliteal artery bypass with 6 mm ringed PTFE SQ heparin for DVT preventative PT/OT consult for mobility Plan to change dressing tomorrow.  Right foot edema, patient states it has been swollen for a while.  Ace does not appear to be restrictive, encourage elevation as tolerates.   Mosetta Pigeon 01/28/2019 7:57 AM --  Laboratory Lab Results: Recent Labs    01/27/19 0913  WBC 9.4  HGB 11.9*  HCT 36.9*  PLT 217   BMET Recent Labs    01/27/19 0913  NA 139  K 4.0  CL 104  CO2 24  GLUCOSE 151*  BUN 21  CREATININE 0.79  CALCIUM 10.0    COAG Lab Results  Component Value Date   INR 1.0 01/27/2019   INR 1.02 08/12/2018   No results found for: PTT   I agree with the above.  I have seen and examined the patient.  Brisk signals in right foot.  Incisions all ok  Continue home meds SQ heparin for DVT prophylaxis Acute blood loss anemia:  Hb 8.2 today.  Repeat CBC in am Mobilize with PT.  Needs OOB  Wyonia Hough

## 2019-01-28 NOTE — Evaluation (Signed)
Physical Therapy Evaluation Patient Details Name: Brandon Collins MRN: [id number] DOB: 1951-04-05 Today's Date: 01/28/2019   History of Present Illness  Ptis a 68 y.o.malewith history of recent cervical surgery (08/12/2018). Pt has a PMH including Arthritis, High cholesterol, HOH, HTN, and Type II diabetes mellitus. 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  Clinical Impression  Pt admitted with above diagnosis. Pt currently with functional limitations due to the deficits listed below (see PT Problem List). Pt was able to squat pivot to recliner with mod assist of 2 with pt needing assist to clear the armrest of chair as he normally moves the wheelchair armrest.  Pt would benefit from CIR as he is motivated to try and stand and walk.  Will follow acutely.   Pt will benefit from skilled PT to increase their independence and safety with mobility to allow discharge to the venue listed below.      Follow Up Recommendations CIR    Equipment Recommendations  Other (comment)(TBA)    Recommendations for Other Services       Precautions / Restrictions Precautions Precautions: Fall Restrictions Weight Bearing Restrictions: No      Mobility  Bed Mobility Overal bed mobility: Needs Assistance Bed Mobility: Supine to Sit     Supine to sit: Min assist;HOB elevated     General bed mobility comments: A to initiate BLE to EOB, assist by pulling on stabilized therapist to bring hips EOB  Transfers Overall transfer level: Needs assistance Equipment used: 2 person hand held assist Transfers: Squat Pivot Transfers     Squat pivot transfers: Mod assist;+2 physical assistance;+2 safety/equipment     General transfer comment: assist for boost and for lateral movement to recliner, Pt typically has a drop arm and needed assist to get over recliner arm rest  Ambulation/Gait                Stairs            Wheelchair  Mobility    Modified Rankin (Stroke Patients Only)       Balance Overall balance assessment: Needs assistance Sitting-balance support: Single extremity supported;Feet supported Sitting balance-Leahy Scale: Fair     Standing balance support: Bilateral upper extremity supported Standing balance-Leahy Scale: Zero Standing balance comment: unable to achieve standing                             Pertinent Vitals/Pain Pain Assessment: Faces Pain Score: 6  Faces Pain Scale: Hurts even more Pain Location: R leg Pain Descriptors / Indicators: Constant;Discomfort;Grimacing;Guarding;Penetrating;Sore Pain Intervention(s): Limited activity within patient's tolerance;Monitored during session;Repositioned    Home Living Family/patient expects to be discharged to:: Private residence Living Arrangements: Spouse/significant other Available Help at Discharge: Family Type of Home: House Home Access: Ramped entrance     Home Layout: One level Home Equipment: Bedside commode;Wheelchair - manual Additional Comments: has been at SNF - only recently home    Prior Function Level of Independence: Needs assistance   Gait / Transfers Assistance Needed: independently transfers at wheelchair level  ADL's / Homemaking Assistance Needed: assist for bathing/dressing  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, 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     Hand Dominance   Dominant Hand: Right    Extremity/Trunk Assessment   Upper Extremity Assessment Upper Extremity Assessment: Defer to OT evaluation    Lower Extremity Assessment Lower Extremity Assessment: RLE deficits/detail RLE Deficits /  Details: Appears to have slight knee contracture at at least 15 degrees RLE: Unable to fully assess due to pain    Cervical / Trunk Assessment Cervical / Trunk Assessment: Other exceptions Cervical / Trunk Exceptions: cervical sx in Nov 2019  Communication   Communication: No difficulties   Cognition Arousal/Alertness: Awake/alert Behavior During Therapy: Flat affect Overall Cognitive Status: No family/caregiver present to determine baseline cognitive functioning Area of Impairment: Safety/judgement;Problem solving;Awareness                         Safety/Judgement: Decreased awareness of safety;Decreased awareness of deficits Awareness: Emergent Problem Solving: Requires verbal cues        General Comments General comments (skin integrity, edema, etc.): R knee very painful and unable to achieve extension at this time    Exercises General Exercises - Lower Extremity Ankle Circles/Pumps: AROM;Both;5 reps;Supine Quad Sets: AAROM;Both;5 reps;Seated   Assessment/Plan    PT Assessment Patient needs continued PT services  PT Problem List Decreased activity tolerance;Decreased balance;Decreased strength;Decreased range of motion;Decreased mobility;Decreased knowledge of use of DME;Decreased safety awareness;Decreased knowledge of precautions       PT Treatment Interventions DME instruction;Gait training;Functional mobility training;Therapeutic activities;Therapeutic exercise;Balance training;Patient/family education;Wheelchair mobility training    PT Goals (Current goals can be found in the Care Plan section)  Acute Rehab PT Goals Patient Stated Goal: to be able to walk again PT Goal Formulation: With patient Time For Goal Achievement: 02/11/19 Potential to Achieve Goals: Good    Frequency Min 3X/week   Barriers to discharge Decreased caregiver support      Co-evaluation PT/OT/SLP Co-Evaluation/Treatment: Yes Reason for Co-Treatment: Complexity of the patient's impairments (multi-system involvement);For patient/therapist safety PT goals addressed during session: Mobility/safety with mobility OT goals addressed during session: ADL's and self-care       AM-PAC PT "6 Clicks" Mobility  Outcome Measure Help needed turning from your back to your side  while in a flat bed without using bedrails?: A Little Help needed moving from lying on your back to sitting on the side of a flat bed without using bedrails?: A Lot Help needed moving to and from a bed to a chair (including a wheelchair)?: A Lot Help needed standing up from a chair using your arms (e.g., wheelchair or bedside chair)?: Total Help needed to walk in hospital room?: Total Help needed climbing 3-5 steps with a railing? : Total 6 Click Score: 10    End of Session Equipment Utilized During Treatment: Gait belt Activity Tolerance: Patient limited by fatigue;Patient limited by pain Patient left: in chair;with call bell/phone within reach;with chair alarm set Nurse Communication: Mobility status PT Visit Diagnosis: Unsteadiness on feet (R26.81);Muscle weakness (generalized) (M62.81)    Time: [id number] PT Time Calculation (min) (ACUTE ONLY): 15 min   Charges:   PT Evaluation $PT Eval Moderate Complexity: 1 Mod          Trelyn Vanderlinde,PT Acute Rehabilitation Services Pager:  [phone]  Office:  [phone]    Berline LopesDawn F Tammera Engert 01/28/2019, 1:28 PM

## 2019-01-28 NOTE — Progress Notes (Signed)
Rehab Admissions Coordinator Note:  Per OT recommendation, this pt was screened by Nanine Means for appropriateness for an Inpatient Acute Rehab Consult.  At this time, we are recommending Inpatient Rehab consult. AC will contact MD to request an order.   Nanine Means 01/28/2019, 1:22 PM  I can be reached at [phone].

## 2019-01-28 NOTE — Evaluation (Signed)
Occupational Therapy Evaluation Patient Details Name: Brandon Collins MRN: [id number] DOB: 11-27-1950 Today's Date: 01/28/2019    History of Present Illness Ptis a 68 y.o.malewith history of recent cervical surgery (08/12/2018). Pt has a PMH including Arthritis, High cholesterol, HOH, HTN, and Type II diabetes mellitus. 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   Clinical Impression   PTA Pt was recently at SNF able to perform wheelchair transfers independently with drop arm. Supervision for LB ADL and mod I for UB ADL. Pt currently mod A +2 for squat pivot transfers, max A for LB ADL, min guard for UB ADL. Pt will benefit from skilled OT at the acute setting as well as at CIR level as Pt is very motivated to progress - prior to cervical sx in Nov was working full time and ambulating with no DME. OT will continue to follow acutely. Next session to practice transfers as well as AE for LB ADL.     Follow Up Recommendations  CIR;Supervision/Assistance - 24 hour    Equipment Recommendations  Other (comment)(defer to next venue of care)    Recommendations for Other Services Rehab consult     Precautions / Restrictions Precautions Precautions: Fall Restrictions Weight Bearing Restrictions: No      Mobility Bed Mobility Overal bed mobility: Needs Assistance Bed Mobility: Supine to Sit     Supine to sit: Min assist;HOB elevated     General bed mobility comments: A to initiate BLE to EOB, assist by pulling on stabilized therapist to bring hips EOB  Transfers Overall transfer level: Needs assistance Equipment used: 2 person hand held assist Transfers: Squat Pivot Transfers     Squat pivot transfers: Mod assist;+2 physical assistance;+2 safety/equipment     General transfer comment: assist for boost and for lateral movement to recliner, Pt typically has a drop arm and needed assist to get over recliner arm rest     Balance Overall balance assessment: Needs assistance Sitting-balance support: Single extremity supported;Feet supported Sitting balance-Leahy Scale: Fair     Standing balance support: Bilateral upper extremity supported Standing balance-Leahy Scale: Zero Standing balance comment: unable to achieve standing                           ADL either performed or assessed with clinical judgement   ADL Overall ADL's : Needs assistance/impaired Eating/Feeding: Modified independent   Grooming: Set up;Sitting   Upper Body Bathing: Minimal assistance;Sitting   Lower Body Bathing: Maximal assistance;Sitting/lateral leans Lower Body Bathing Details (indicate cue type and reason): unable to reach past knees currently Upper Body Dressing : Minimal assistance   Lower Body Dressing: Maximal assistance;Sitting/lateral leans Lower Body Dressing Details (indicate cue type and reason): unable to access RLE at this time Toilet Transfer: Moderate assistance;+2 for physical assistance;+2 for safety/equipment;Squat-pivot;BSC;Requires drop arm   Toileting- Clothing Manipulation and Hygiene: Minimal assistance;Sitting/lateral lean       Functional mobility during ADLs: Moderate assistance;+2 for physical assistance;+2 for safety/equipment(squat pivot only) General ADL Comments: decreased access to LB due to new sx, potentially cog deficits, blanace, safety awareness,     Vision Baseline Vision/History: Wears glasses Wears Glasses: At all times Patient Visual Report: No change from baseline       Perception     Praxis      Pertinent Vitals/Pain Pain Assessment: 0-10 Pain Score: 6  Pain Location: R leg Pain Descriptors / Indicators: Constant;Discomfort;Grimacing;Guarding;Penetrating;Sore Pain Intervention(s): Monitored during session;Repositioned;Limited  activity within patient's tolerance     Hand Dominance Right   Extremity/Trunk Assessment Upper Extremity Assessment Upper  Extremity Assessment: Overall WFL for tasks assessed   Lower Extremity Assessment Lower Extremity Assessment: Defer to PT evaluation   Cervical / Trunk Assessment Cervical / Trunk Assessment: Other exceptions Cervical / Trunk Exceptions: cervical sx in Nov 2019   Communication Communication Communication: No difficulties   Cognition Arousal/Alertness: Awake/alert Behavior During Therapy: Flat affect Overall Cognitive Status: No family/caregiver present to determine baseline cognitive functioning Area of Impairment: Safety/judgement;Problem solving;Awareness                         Safety/Judgement: Decreased awareness of safety;Decreased awareness of deficits Awareness: Emergent Problem Solving: Requires verbal cues     General Comments  R knee very painful and unable to achieve extension at this time    Exercises     Shoulder Instructions      Home Living Family/patient expects to be discharged to:: Private residence Living Arrangements: Spouse/significant other Available Help at Discharge: Family Type of Home: House Home Access: Ramped entrance     Home Layout: One level     Bathroom Shower/Tub: Tub/shower unit(has been sponge bathing since cervical sx)   Bathroom Toilet: Standard     Home Equipment: Bedside commode;Wheelchair - manual   Additional Comments: has been at SNF - only recently home      Prior Functioning/Environment Level of Independence: Needs assistance  Gait / Transfers Assistance Needed: independently transfers at wheelchair level ADL's / Homemaking Assistance Needed: assist for bathing/dressing   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, 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        OT Problem List: Decreased range of motion;Decreased activity tolerance;Impaired balance (sitting and/or standing);Decreased cognition;Decreased safety awareness;Decreased knowledge of use of DME or AE;Decreased knowledge of  precautions;Pain;Increased edema      OT Treatment/Interventions: Self-care/ADL training;Therapeutic exercise;DME and/or AE instruction;Therapeutic activities;Cognitive remediation/compensation;Patient/family education;Balance training    OT Goals(Current goals can be found in the care plan section) Acute Rehab OT Goals Patient Stated Goal: to be able to walk again OT Goal Formulation: With patient Time For Goal Achievement: 02/11/19 Potential to Achieve Goals: Good ADL Goals Pt Will Perform Grooming: with modified independence;sitting Pt Will Perform Lower Body Bathing: with modified independence;with adaptive equipment;sitting/lateral leans Pt Will Perform Lower Body Dressing: with supervision;with adaptive equipment;sit to/from stand Pt Will Transfer to Toilet: squat pivot transfer;with supervision Pt Will Perform Toileting - Clothing Manipulation and hygiene: with modified independence;sitting/lateral leans Pt Will Perform Tub/Shower Transfer: Tub transfer;with supervision;with caregiver independent in assisting;tub bench Additional ADL Goal #1: Pt will perform bed mobility at mod I level prior to engaging in ADL  OT Frequency: Min 3X/week   Barriers to D/C:            Co-evaluation PT/OT/SLP Co-Evaluation/Treatment: Yes Reason for Co-Treatment: Complexity of the patient's impairments (multi-system involvement);For patient/therapist safety;To address functional/ADL transfers PT goals addressed during session: Mobility/safety with mobility;Balance OT goals addressed during session: ADL's and self-care      AM-PAC OT "6 Clicks" Daily Activity     Outcome Measure Help from another person eating meals?: None Help from another person taking care of personal grooming?: A Little Help from another person toileting, which includes using toliet, bedpan, or urinal?: A Lot Help from another person bathing (including washing, rinsing, drying)?: A Lot Help from another person to put on  and taking off regular upper body clothing?:  A Little Help from another person to put on and taking off regular lower body clothing?: A Lot 6 Click Score: 16   End of Session Equipment Utilized During Treatment: Gait belt Nurse Communication: Mobility status  Activity Tolerance: Patient tolerated treatment well Patient left: in chair;with call bell/phone within reach;with chair alarm set  OT Visit Diagnosis: Unsteadiness on feet (R26.81);Other abnormalities of gait and mobility (R26.89);Pain Pain - Right/Left: Right Pain - part of body: Knee;Leg                Time: 1131-1151 OT Time Calculation (min): 20 min Charges:  OT General Charges $OT Visit: 1 Visit OT Evaluation $OT Eval Moderate Complexity: 1 Mod  Sherryl MangesLaura Esaiah Wanless OTR/L Acute Rehabilitation Services Pager: [phone] Office: [phone] Evern BioLaura J Rodrigues [place] 01/28/2019, 1:03 PM

## 2019-01-29 ENCOUNTER — Telehealth: Payer: Self-pay | Admitting: Vascular Surgery

## 2019-01-29 ENCOUNTER — Inpatient Hospital Stay (HOSPITAL_COMMUNITY): Payer: Medicare HMO

## 2019-01-29 DIAGNOSIS — I739 Peripheral vascular disease, unspecified: Secondary | ICD-10-CM

## 2019-01-29 LAB — GLUCOSE, CAPILLARY
Glucose-Capillary: 56 mg/dL — ABNORMAL LOW (ref 70–99)
Glucose-Capillary: 78 mg/dL (ref 70–99)
Glucose-Capillary: 109 mg/dL — ABNORMAL HIGH (ref 70–99)
Glucose-Capillary: 144 mg/dL — ABNORMAL HIGH (ref 70–99)
Glucose-Capillary: 65 mg/dL — ABNORMAL LOW (ref 70–99)
Glucose-Capillary: 84 mg/dL (ref 70–99)

## 2019-01-29 MED ORDER — ADULT MULTIVITAMIN W/MINERALS CH
1.0000 | ORAL_TABLET | Freq: Every day | ORAL | Status: DC
  Administered 2019-01-29 – 2019-02-02 (×5): 1 via ORAL
  Filled 2019-01-29 (×5): qty 1

## 2019-01-29 MED ORDER — ENSURE ENLIVE PO LIQD
237.0000 mL | Freq: Two times a day (BID) | ORAL | Status: DC
  Administered 2019-01-29 – 2019-02-01 (×7): 237 mL via ORAL

## 2019-01-29 NOTE — Progress Notes (Signed)
Physical Therapy Treatment Patient Details Name: Brandon OchsCarl S Ottaviano MRN: [id number] DOB: [birth year] Today's Date: 01/29/2019    History of Present Illness Ptis a 68 y.o.malewith history of recent cervical surgery (08/12/2018). Pt has a PMH including Arthritis, High cholesterol, HOH, HTN, and Type II diabetes mellitus. 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    PT Comments    Pt admitted with above diagnosis. Pt currently with functional limitations due to balance and endurance deficits. Pt was able to scoot and pivot to the drop arm recliner with min guard assist and cues only.  Also worked on ROM on right knee.  MD:  Would CPM be helpful?  Will follow acutely.   Pt will benefit from skilled PT to increase their independence and safety with mobility to allow discharge to the venue listed below.     Follow Up Recommendations  CIR     Equipment Recommendations  Other (comment)(TBA)    Recommendations for Other Services       Precautions / Restrictions Precautions Precautions: Fall Restrictions Weight Bearing Restrictions: No    Mobility  Bed Mobility Overal bed mobility: Needs Assistance Bed Mobility: Supine to Sit     Supine to sit: Min assist;HOB elevated     General bed mobility comments: A to initiate BLE to EOB, assist by pulling on stabilized therapist to bring hips EOB  Transfers Overall transfer level: Needs assistance Equipment used: None Transfers: Lateral/Scoot Transfers          Lateral/Scoot Transfers: Min guard;Supervision General transfer comment: No assist needed to scoot pivot to drop arm recliner to pts left side.    Ambulation/Gait                 Stairs             Wheelchair Mobility    Modified Rankin (Stroke Patients Only)       Balance Overall balance assessment: Needs assistance Sitting-balance support: Single extremity supported;Feet supported Sitting  balance-Leahy Scale: Fair                                      Cognition Arousal/Alertness: Awake/alert Behavior During Therapy: Flat affect Overall Cognitive Status: No family/caregiver present to determine baseline cognitive functioning Area of Impairment: Safety/judgement;Problem solving;Awareness                         Safety/Judgement: Decreased awareness of safety;Decreased awareness of deficits Awareness: Emergent Problem Solving: Requires verbal cues        Exercises General Exercises - Lower Extremity Ankle Circles/Pumps: AROM;Both;5 reps;Supine Quad Sets: AAROM;Both;5 reps;Seated Heel Slides: AAROM;Both;10 reps;Supine Straight Leg Raises: AAROM;Both;10 reps;Seated    General Comments General comments (skin integrity, edema, etc.): R knee very painful and unable to achieve extension at this time however worked on knee extension      Pertinent Vitals/Pain Pain Assessment: Faces Faces Pain Scale: Hurts whole lot Pain Location: R leg Pain Descriptors / Indicators: Constant;Discomfort;Grimacing;Guarding;Penetrating;Sore Pain Intervention(s): Limited activity within patient's tolerance;Monitored during session;Repositioned    Home Living                      Prior Function            PT Goals (current goals can now be found in the care plan section) Acute Rehab PT Goals Patient Stated Goal:  to be able to walk again Progress towards PT goals: Progressing toward goals    Frequency    Min 3X/week      PT Plan Current plan remains appropriate    Co-evaluation              AM-PAC PT "6 Clicks" Mobility   Outcome Measure  Help needed turning from your back to your side while in a flat bed without using bedrails?: A Little Help needed moving from lying on your back to sitting on the side of a flat bed without using bedrails?: A Little Help needed moving to and from a bed to a chair (including a wheelchair)?: A  Little Help needed standing up from a chair using your arms (e.g., wheelchair or bedside chair)?: Total Help needed to walk in hospital room?: Total Help needed climbing 3-5 steps with a railing? : Total 6 Click Score: 12    End of Session Equipment Utilized During Treatment: Gait belt Activity Tolerance: Patient limited by fatigue;Patient limited by pain Patient left: in chair;with call bell/phone within reach;with chair alarm set Nurse Communication: Mobility status PT Visit Diagnosis: Unsteadiness on feet (R26.81);Muscle weakness (generalized) (M62.81)     Time: 4782-9562 PT Time Calculation (min) (ACUTE ONLY): 15 min  Charges:  $Therapeutic Activity: 8-22 mins                     Jax Kentner,PT Acute Rehabilitation Services Pager:  [phone]  Office:  [phone]     Berline Lopes 01/29/2019, 10:59 AM

## 2019-01-29 NOTE — Progress Notes (Signed)
Initial Nutrition Assessment  DOCUMENTATION CODES:   Not applicable  INTERVENTION:    Ensure Enlive po BID, each supplement provides 350 kcal and 20 grams of protein  MVI daily  NUTRITION DIAGNOSIS:   Increased nutrient needs related to post-op healing as evidenced by estimated needs.  GOAL:   Patient will meet greater than or equal to 90% of their needs  MONITOR:   Weight trends, Labs, Skin, I & O's, Supplement acceptance, PO intake  REASON FOR ASSESSMENT:   Malnutrition Screening Tool    ASSESSMENT:   Patient with PMH significant for DM, HTN, and recent neck surgery. Presents this admission with right lower extremity wound.    5/7- angiography revealed occlusion of SFA  5/12- right common femoral to below-knee popliteal artery bypass  RD working remotely.  Unable to reach pt via phone. Meal completions charted as 60-100% for pt's last two meals. Will need to obtain nutrition/weight history if possible. Pt was seen by clinical nutrition in December and consumed Ensure. RD to provide them this admission to maximize kcals and protein.   Weight records limited in history. Current weight looks to be stated. Will need to obtain recent weight to assess for weight loss.   Plan for CIR at discharge.   I/O: +609 ml since admit UOP: 1,150 ml x 24 hrs   Drips: NS @ 75 ml/hr, 2 g Mg sulfate Medications: Vit D, colace, SS novolog,  Labs: CBG 56-144  Diet Order:   Diet Order            Diet heart healthy/carb modified Room service appropriate? Yes with Assist; Fluid consistency: Thin  Diet effective now              EDUCATION NEEDS:   Not appropriate for education at this time  Skin:  Skin Assessment: Skin Integrity Issues: Skin Integrity Issues:: Incisions Incisions: right leg, neck   Last BM:  PTA  Height:   Ht Readings from Last 1 Encounters:  01/27/19 6' (1.829 m)    Weight:   Wt Readings from Last 1 Encounters:  01/27/19 90.7 kg    Ideal  Body Weight:  80.9 kg  BMI:  Body mass index is 27.12 kg/m.  Estimated Nutritional Needs:   Kcal:  2150-2350 kcal  Protein:  105-120 grams  Fluid:  >/= 2 L/day    Vanessa Kick RD, LDN Clinical Nutrition Pager # - [phone]

## 2019-01-29 NOTE — Progress Notes (Signed)
Pt is resting well on the recliner eyes closed.

## 2019-01-29 NOTE — Progress Notes (Signed)
Inpatient Rehab Admissions:  Inpatient Rehab Consult received.  I met with patient at the bedside for rehabilitation assessment and to discuss goals and expectations of an inpatient rehab admission.  He is open to CIR stay but asked that I call his wife to discuss. Butch Penny (wife) would like to talk to patient prior to making any decision.  I will follow up with them tomorrow.   Signed: Shann Medal, PT, DPT Admissions Coordinator [phone] 01/29/19  2:40 PM

## 2019-01-29 NOTE — Progress Notes (Signed)
Pr c/o pain on right leg pain and discomfort on his back. Patient asked to be on the chair. Patient not able to put pressure on right leg . With 2 assist, we helped Pt slide to the recliner. Pain medication given.we'll continue to monitor.

## 2019-01-29 NOTE — Progress Notes (Addendum)
     Subjective  - Doing a little better today.   Objective 119/76 98 98.2 F (36.8 C) (Oral) (!) 22 100%  Intake/Output Summary (Last 24 hours) at 01/29/2019 0947 Last data filed at 01/29/2019 0750 Gross per 24 hour  Intake 720 ml  Output 1100 ml  Net -380 ml    Right DP/PT/peroneal doppler signals Incisions healing well, right groin soft Moderate right foot edema, baseline Lungs non labored breathing  Assessment/Planning: POD #2  1.Right external iliac artery, common femoral artery and profunda femoris artery endarterectomy with vein patch angioplasty 2.Harvest of right greater saphenous vein 3.Right common femoral to below-knee popliteal artery bypass with 6 mm ringed PTFE SQ heparin for DVT preventative  PT recommend CIR consult.  This was ordered. Pending mobility disposition stable with patent by pass and brisk doppler signals.  Mosetta Pigeon 01/29/2019 9:47 AM --  Laboratory Lab Results: Recent Labs    01/27/19 0913 01/28/19 0728  WBC 9.4 7.3  HGB 11.9* 8.2*  HCT 36.9* 24.5*  PLT 217 186   BMET Recent Labs    01/27/19 0913 01/28/19 0728  NA 139 138  K 4.0 3.6  CL 104 102  CO2 24 22  GLUCOSE 151* 94  BUN 21 24*  CREATININE 0.79 0.99  CALCIUM 10.0 8.7*    COAG Lab Results  Component Value Date   INR 1.0 01/27/2019   INR 1.02 08/12/2018   No results found for: PTT  I have independently interviewed and examined patient and agree with PA assessment and plan above.   Shakesha Soltau C. Randie Heinz, MD [facility] [place] Office: [phone] Pager: [phone]

## 2019-01-29 NOTE — Telephone Encounter (Signed)
sch appt lvm mld ltr 02/20/2019 1220pm p/o MD

## 2019-01-29 NOTE — Telephone Encounter (Signed)
-----   Message from Dara Lords, [place] sent at 01/27/2019  3:00 PM EDT ----- S/p right fem pop bypass 01/27/2019.  F/u with Dr. Randie Heinz in 2-3 weeks.  Thanks

## 2019-01-29 NOTE — Progress Notes (Signed)
ABI's have been completed. Preliminary results can be found in CV Proc through chart review.   01/29/19 11:17 AM Olen Cordial RVT

## 2019-01-29 NOTE — Progress Notes (Signed)
Hypoglycemic Event  CBG: 56  Treatment: 8 ounces orange juice  Symptoms: None  Follow-up CBG: Time: 0700 CBG Result:78  Possible Reasons for Event: Poor POs   Comments/MD notified: No   Anne Ng

## 2019-01-30 LAB — GLUCOSE, CAPILLARY
Glucose-Capillary: 45 mg/dL — ABNORMAL LOW (ref 70–99)
Glucose-Capillary: 111 mg/dL — ABNORMAL HIGH (ref 70–99)
Glucose-Capillary: 91 mg/dL (ref 70–99)
Glucose-Capillary: 148 mg/dL — ABNORMAL HIGH (ref 70–99)

## 2019-01-30 NOTE — Care Management Important Message (Signed)
Important Message  Patient Details  Name: Brandon Collins MRN: [id number] Date of Birth: 13-Apr-1951   Medicare Important Message Given:  Yes    [name] 01/30/2019, 2:24 PM

## 2019-01-30 NOTE — TOC Initial Note (Addendum)
Transition of Care (TOC) - Initial/Assessment Note  Donn Pierini RN, BSN Transitions of Care Unit 4E- RN Case Manager [phone]   Patient Details  Name: Brandon Collins MRN: [id number] Date of Birth: 08/30/1951  Transition of Care [facility]) CM/SW Contact:    Darrold Span, RN Phone Number: 01/30/2019, 3:14 PM  Clinical Narrative:                 Pt admitted s/p bypass graft with angioplasty. CIR consulted for possible admission- however was notified by Luther Parody, [facility] admission coordinator that wife has chosen to take pt home and would like [facility] services. HH orders for PT/OT have been placed- CM in to speak with pt but he deferred to wife- TC placed with no answer, and msg left to offer choice for [facility] agency. Awaiting return call.  1600- received call return from wife- Brandon Collins- per conversation pt has [facility] services already with [facility] wife would like to continue services with them. Pt has all needed DME, and will borrow RW as he is not eligible for one under Medicare (has received DME less than 5 yrs) pt also will continue to f/u with [facility] for wound care needs per wife. Call made to [facility], spoke with Kennyth Arnold to notify for continued [facility] needs- faxed new orders via epic to [phone]    Expected Discharge Plan: IP Rehab Facility Barriers to Discharge: No Barriers Identified   Patient Goals and CMS Choice Patient states their goals for this hospitalization and ongoing recovery are:: "home or whatever my wife feels is best" CMS Medicare.gov Compare Post Acute Care list provided to:: Patient Choice offered to / list presented to : Spouse  Expected Discharge Plan and Services Expected Discharge Plan: IP Rehab Facility   Discharge Planning Services: CM Consult Post Acute Care Choice: Home Health Living arrangements for the past 2 months: Single Family Home                 DME Arranged: N/A DME Agency: NA       HH Arranged: PT, OT           Prior Living Arrangements/Services Living arrangements for the past 2 months: Single Family Home Lives with:: Self, Spouse Patient language and need for interpreter reviewed:: Yes        Need for Family Participation in Patient Care: Yes (Comment) Care giver support system in place?: Yes (comment) Current home services: DME Criminal Activity/Legal Involvement Pertinent to Current Situation/Hospitalization: No - Comment as needed  Activities of Daily Living Home Assistive Devices/Equipment: Wheelchair, CBG Meter, Eyeglasses ADL Screening (condition at time of admission) Patient's cognitive ability adequate to safely complete daily activities?: Yes Is the patient deaf or have difficulty hearing?: No Does the patient have difficulty seeing, even when wearing glasses/contacts?: No Does the patient have difficulty concentrating, remembering, or making decisions?: No Patient able to express need for assistance with ADLs?: Yes Does the patient have difficulty dressing or bathing?: Yes Independently performs ADLs?: Yes (appropriate for developmental age) Does the patient have difficulty walking or climbing stairs?: Yes Weakness of Legs: Both Weakness of Arms/Hands: None  Permission Sought/Granted Permission sought to share information with : Case Manager, Family Supports, Magazine features editor Permission granted to share information with : Yes, Verbal Permission Granted  Share Information with NAME: Brandon Collins     Permission granted to share info w Relationship: wife  Permission granted to share info w Contact Information: [phone]  Emotional Assessment Appearance:: Appears older than stated age Attitude/Demeanor/Rapport: Engaged Affect (typically observed): Appropriate, Pleasant Orientation: : Oriented to Self, Oriented to Place, Oriented to Situation, Oriented to  Time Alcohol / Substance Use: Not Applicable Psych Involvement: No (comment)  Admission  diagnosis:  PERIPHERAL ARTERY DISEASE RIGHT LEG Patient Active Problem List   Diagnosis Date Noted  . PAD (peripheral artery disease) (HCC) 01/27/2019  . Ankle ulcer, right, limited to breakdown of skin (HCC) 10/21/2018  . Primary osteoarthritis of right knee 10/21/2018  . Hyponatremia   . Pain   . Cervical myelopathy (HCC) 08/19/2018  . Cervical cord compression with myelopathy (HCC)   . Postoperative pain   . Diabetes mellitus type 2 in nonobese (HCC)   . Essential hypertension   . Dyslipidemia   . Drug induced constipation   . Myelopathy (HCC) 08/12/2018   PCP:  Nonnie DoneSlatosky, John J., MD Pharmacy:   [facility] [phone][id number] - [place] - [address] [facility] [place] [id number] Phone: [phone] Fax: [phone]     Social Determinants of Health (SDOH) Interventions    Readmission Risk Interventions No flowsheet data found.

## 2019-01-30 NOTE — Progress Notes (Signed)
Inpatient Diabetes Program Recommendations  AACE/ADA: New Consensus Statement on Inpatient Glycemic Control (2015)  Target Ranges:  Prepandial:   less than 140 mg/dL      Peak postprandial:   less than 180 mg/dL (1-2 hours)      Critically ill patients:  140 - 180 mg/dL   Results for Brandon Collins, Brandon Collins (MRN [id number]) as of 01/30/2019 10:45  Ref. Range 01/29/2019 06:17 01/29/2019 07:01 01/29/2019 11:26 01/29/2019 17:23 01/29/2019 21:34 01/29/2019 22:21 01/30/2019 06:05  Glucose-Capillary Latest Ref Range: 70 - 99 mg/dL 56 (L) 78 737 (H) 106 (H) 65 (L) 84 45 (L)    Review of Glycemic Control  Diabetes history: DM 2 Outpatient Diabetes medications: Glimepiride 4 mg Daily at supper, Actos 15 mg qam Current orders for Inpatient glycemic control: Glimepiride 4 mg Daily at supper, Actos 15 mg qam, Novolog 0-9 units tid  Inpatient Diabetes Program Recommendations:    Frequent hypoglycemia. Patient prescribed DM oral meds from home while inpatient. Patient on sulfonylurea, known to cause hypoglycemia. Please d/c Glimepiride while inpatient.  Thanks,  Christena Deem RN, MSN, BC-ADM Inpatient Diabetes Coordinator Team Pager [phone] (8a-5p)

## 2019-01-30 NOTE — Progress Notes (Signed)
Occupational Therapy Treatment Patient Details Name: Brandon Collins MRN: [id number] DOB: [birth year] Today's Date: 01/30/2019    History of present illness Ptis a 68 y.o.malewith history of recent cervical surgery (08/12/2018). Pt has a PMH including Arthritis, High cholesterol, HOH, HTN, and Type II diabetes mellitus. 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   OT comments  Pt is aware of compensatory strategies for LB bathing and dressing. He is accustomed to sponge bathing and transferring by lateral scoot. He has all necessary equipment at home.Updated d/c to home health.  Follow Up Recommendations  Home health OT;Supervision/Assistance - 24 hour    Equipment Recommendations  None recommended by OT    Recommendations for Other Services      Precautions / Restrictions Precautions Precautions: Fall       Mobility Bed Mobility Overal bed mobility: Needs Assistance Bed Mobility: Supine to Sit;Sit to Supine     Supine to sit: Min assist Sit to supine: Min assist   General bed mobility comments: assist for R LE  Transfers                 General transfer comment: pt declined OOB to chair, worked on lateral scooting along EOB    Balance Overall balance assessment: Needs assistance   Sitting balance-Leahy Scale: Fair                                     ADL either performed or assessed with clinical judgement   ADL Overall ADL's : Needs assistance/impaired     Grooming: Wash/dry hands;Wash/dry face;Oral care;Sitting;Set up Grooming Details (indicate cue type and reason): at EOB         Upper Body Dressing : Minimal assistance Upper Body Dressing Details (indicate cue type and reason): changed gown Lower Body Dressing: Bed level;Set up Lower Body Dressing Details (indicate cue type and reason): can reach feet in long sitting at bed level to don socks                      Vision       Perception     Praxis      Cognition Arousal/Alertness: Awake/alert Behavior During Therapy: Flat affect Overall Cognitive Status: No family/caregiver present to determine baseline cognitive functioning Area of Impairment: Safety/judgement;Problem solving;Awareness                         Safety/Judgement: Decreased awareness of safety;Decreased awareness of deficits Awareness: Emergent Problem Solving: Requires verbal cues General Comments: pt deferring discharge decisions to medical staff and wife        Exercises     Shoulder Instructions       General Comments      Pertinent Vitals/ Pain       Pain Assessment: Faces Faces Pain Scale: Hurts even more Pain Location: back Pain Descriptors / Indicators: Grimacing;Guarding Pain Intervention(s): Patient requesting pain meds-RN notified;Repositioned;Limited activity within patient's tolerance  Home Living                                          Prior Functioning/Environment              Frequency  Min 3X/week  Progress Toward Goals  OT Goals(current goals can now be found in the care plan section)  Progress towards OT goals: Progressing toward goals  Acute Rehab OT Goals Patient Stated Goal: to be able to walk again OT Goal Formulation: With patient Time For Goal Achievement: 02/11/19 Potential to Achieve Goals: Good  Plan Discharge plan needs to be updated    Co-evaluation                 AM-PAC OT "6 Clicks" Daily Activity     Outcome Measure   Help from another person eating meals?: None Help from another person taking care of personal grooming?: A Little Help from another person toileting, which includes using toliet, bedpan, or urinal?: A Lot Help from another person bathing (including washing, rinsing, drying)?: A Little Help from another person to put on and taking off regular upper body clothing?: A Little Help from another  person to put on and taking off regular lower body clothing?: A Little 6 Click Score: 18    End of Session    OT Visit Diagnosis: Unsteadiness on feet (R26.81);Other abnormalities of gait and mobility (R26.89);Pain   Activity Tolerance Patient limited by pain   Patient Left in bed;with call bell/phone within reach;with bed alarm set   Nurse Communication Patient requests pain meds        Time: [id number] OT Time Calculation (min): 19 min  Charges: OT General Charges $OT Visit: 1 Visit OT Treatments $Self Care/Home Management : 8-22 mins  Martie RoundJulie Brittainy Bucker, OTR/L Acute Rehabilitation Services Pager: [phone] Office: [phone]    Evern BioMayberry, [name] 01/30/2019, 12:18 PM

## 2019-01-30 NOTE — Progress Notes (Signed)
Inpatient Rehab Admissions Coordinator:   Discussed with pt's wife at length.  She feels at min assist level she will be able to care for patient at home and she would prefer him to come home with f/u therapies than stay in any facility.  Will sign off at this time.  I let patient and CM know.   Estill Dooms, PT, DPT Admissions Coordinator [phone] 01/30/19  9:55 AM

## 2019-01-30 NOTE — Progress Notes (Addendum)
  Progress Note    01/30/2019 7:29 AM 3 Days Post-Op  Subjective:  No new complaints   Vitals:   01/30/19 0049 01/30/19 0525  BP: (!) 114/59 (!) 86/50  Pulse: 85 65  Resp: 15 20  Temp: 98.3 F (36.8 C) 97.6 F (36.4 C)  SpO2: 100% 94%   Physical Exam: Lungs:  Non labored Incisions:  R groin healing well; popliteal and lower leg incisions no active drainage Extremities:  Dressing just changed on foot ulcer; brisk PT and AT by doppler Neurologic: A&O  CBC    Component Value Date/Time   WBC 7.3 01/28/2019 0728   RBC 2.85 (L) 01/28/2019 0728   HGB 8.2 (L) 01/28/2019 0728   HCT 24.5 (L) 01/28/2019 0728   PLT 186 01/28/2019 0728   MCV 86.0 01/28/2019 0728   MCH 28.8 01/28/2019 0728   MCHC 33.5 01/28/2019 0728   RDW 13.2 01/28/2019 0728   LYMPHSABS 1.6 08/20/2018 0607   MONOABS 2.3 (H) 08/20/2018 0607   EOSABS 0.1 08/20/2018 0607   BASOSABS 0.1 08/20/2018 0607    BMET    Component Value Date/Time   NA 138 01/28/2019 0728   K 3.6 01/28/2019 0728   CL 102 01/28/2019 0728   CO2 22 01/28/2019 0728   GLUCOSE 94 01/28/2019 0728   BUN 24 (H) 01/28/2019 0728   CREATININE 0.99 01/28/2019 0728   CALCIUM 8.7 (L) 01/28/2019 0728   GFRNONAA >60 01/28/2019 0728   GFRAA >60 01/28/2019 0728    INR    Component Value Date/Time   INR 1.0 01/27/2019 0913     Intake/Output Summary (Last 24 hours) at 01/30/2019 0729 Last data filed at 01/29/2019 0750 Gross per 24 hour  Intake -  Output 150 ml  Net -150 ml     Assessment/Plan:  68 y.o. male is s/p 1.Right external iliac artery, common femoral artery and profunda femoris artery endarterectomy with vein patch angioplasty 2.Harvest of right greater saphenous vein 3.Right common femoral to below-knee popliteal artery bypass with 6 mm ringed PTFE  3 Days Post-Op   Patent bypass with brisk PT and ATA by doppler Dry gauze to R groin to wick moisture Continue dressing changes R foot CIR consulted D/c when  accepted/approved   Emilie Rutter, [name] [facility] [phone] 01/30/2019 7:29 AM  I have seen and evaluated the patient. I agree with the PA note as documented above. Brisk DP /PT signals in right foot.  Has not been OOB yet.  States he wants to go home and needs another day or two.  Will let PT/OT see him as well.  Cephus Shelling, MD [facility] [place] Office: [phone] Pager: [phone]

## 2019-01-31 LAB — GLUCOSE, CAPILLARY
Glucose-Capillary: 117 mg/dL — ABNORMAL HIGH (ref 70–99)
Glucose-Capillary: 224 mg/dL — ABNORMAL HIGH (ref 70–99)
Glucose-Capillary: 147 mg/dL — ABNORMAL HIGH (ref 70–99)
Glucose-Capillary: 144 mg/dL — ABNORMAL HIGH (ref 70–99)

## 2019-01-31 MED ORDER — GLIMEPIRIDE 4 MG PO TABS: 4.0000 mg | ORAL_TABLET | Freq: Every evening | ORAL | Status: DC

## 2019-01-31 MED ORDER — TRAMADOL HCL 50 MG PO TABS: 50.0000 mg | ORAL_TABLET | Freq: Four times a day (QID) | ORAL | 0 refills | Status: DC | PRN

## 2019-01-31 MED ORDER — ADULT MULTIVITAMIN W/MINERALS CH: 1.0000 | ORAL_TABLET | Freq: Every day | ORAL | Status: DC

## 2019-01-31 MED ORDER — ENSURE ENLIVE PO LIQD: 237.0000 mL | Freq: Two times a day (BID) | ORAL | 12 refills | Status: DC

## 2019-01-31 NOTE — Progress Notes (Signed)
[facility] [place]  Subjective  - He is ready to go home and he uses a WC for mobility.   Objective 125/60 90 98.1 F (36.7 C) (Oral) 11 100%  Intake/Output Summary (Last 24 hours) at 01/31/2019 6283 Last data filed at 01/31/2019 0751 Gross per 24 hour  Intake -  Output 1500 ml  Net -1500 ml    Right groin healing well soft, right LE incision healing well Foot ulcer lateral malleolus dressing changed    Doppler signals brisk PT/AT Lungs non labored breathing  Assessment/Planning: POD # 4 Assessment/Plan:  68 y.o. male is s/p 1.Right external iliac artery, common femoral artery and profunda femoris artery endarterectomy with vein patch angioplasty 2.Harvest of right greater saphenous vein 3.Right common femoral to below-knee popliteal artery bypass with 6 mm ringed PTFE  3 Days Post-Op    Patent bypass with brisk doppler signals Pending pain control and mobility for discharge home.  HH ordered. Plan for discharge today    Mosetta Pigeon 01/31/2019 9:09 AM --  Laboratory Lab Results: No results for input(s): WBC, HGB, HCT, PLT in the last 72 hours. BMET No results for input(s): NA, K, CL, CO2, GLUCOSE, BUN, CREATININE, CALCIUM in the last 72 hours.  COAG Lab Results  Component Value Date   INR 1.0 01/27/2019   INR 1.02 08/12/2018   No results found for: PTT

## 2019-01-31 NOTE — Progress Notes (Signed)
Attempted to assist patient to the [facility].  Patient unable to move legs while in the bed to the side of the bed.  Nurse used maximum assist to move legs.  Once the legs were on the side of the bed, patient was able to pull self up to a sitting position.  Multiple attempts were made to assist patient to a standing position, ie: with assist and with the stedy.  Patient became very frustrated and demanded that he could not stand up.  Assisted back to bed and assisted onto the bedpan.  Patient was unable to assist with moving from side to side in the bed.  Page made to Dr. Darrick Penna and update given.  Will cancel discharge for now.  Message left for spouse.

## 2019-02-01 LAB — URINALYSIS, ROUTINE W REFLEX MICROSCOPIC
Bilirubin Urine: NEGATIVE
Glucose, UA: NEGATIVE mg/dL
Hgb urine dipstick: NEGATIVE
Ketones, ur: NEGATIVE mg/dL
Leukocytes,Ua: NEGATIVE
Nitrite: NEGATIVE
Protein, ur: NEGATIVE mg/dL
Specific Gravity, Urine: 1.014 (ref 1.005–1.030)
pH: 5 (ref 5.0–8.0)

## 2019-02-01 LAB — GLUCOSE, CAPILLARY
Glucose-Capillary: 131 mg/dL — ABNORMAL HIGH (ref 70–99)
Glucose-Capillary: 211 mg/dL — ABNORMAL HIGH (ref 70–99)
Glucose-Capillary: 211 mg/dL — ABNORMAL HIGH (ref 70–99)
Glucose-Capillary: 190 mg/dL — ABNORMAL HIGH (ref 70–99)

## 2019-02-01 NOTE — Plan of Care (Signed)
Care plans reviewed and patient is progressing.  

## 2019-02-01 NOTE — Progress Notes (Addendum)
[facility] [place]  Subjective  - He states he can't stand.  He needs to be able to transfer to a WC prior to going home.  He continues to have pain with WB on the left LE.   Objective (!) 103/58 86 98.3 F (36.8 C) (Oral) 18 97%  Intake/Output Summary (Last 24 hours) at 02/01/2019 0816 Last data filed at 02/01/2019 0759 Gross per 24 hour  Intake 714 ml  Output 2745 ml  Net -2031 ml    Right lateral wound dressing clean and dry    Right Doppler signals DP/PT brisk Incision healing well Lungs non labored breathing   Assessment/Planning: POD # 5 68 y.o.maleis s/p 1.Right external iliac artery, common femoral artery and profunda femoris artery endarterectomy with vein patch angioplasty 2.Harvest of right greater saphenous vein 3.Right common femoral to below-knee popliteal artery bypass with 6 mm ringed PTFE   Mobility and ain control with WB activity are his issues. PT/OT need to work with him further.  He uses a WC for mobility at home and needs to be able to transfer prior to discharge.  He may need to consider CIR verses SNF.   I am asking Care management to discuss CIR verses SNF with him again.  Mosetta Pigeon 02/01/2019 8:16 AM --  Agree with above Patent bypass Limited mobility currently, not very motivated  Fabienne Bruns, MD [facility] [place] Office: [phone] Pager: [phone]   Laboratory Lab Results: No results for input(s): WBC, HGB, HCT, PLT in the last 72 hours. BMET No results for input(s): NA, K, CL, CO2, GLUCOSE, BUN, CREATININE, CALCIUM in the last 72 hours.  COAG Lab Results  Component Value Date   INR 1.0 01/27/2019   INR 1.02 08/12/2018   No results found for: PTT

## 2019-02-01 NOTE — Progress Notes (Signed)
Occupational Therapy Treatment Patient Details Name: Brandon Collins MRN: [id number] DOB: 11/05/1950 Today's Date: 02/01/2019    History of present illness Ptis a 68 y.o.malewith history of recent cervical surgery (08/12/2018). Pt has a PMH including Arthritis, High cholesterol, HOH, HTN, and Type II diabetes mellitus. 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   OT comments  Pt. Seen for skilled OT.  Agreeable to oob.  Able to transfer to/from bsc for toileting then to recliner. Partial sit/stand for peri care assistance.  Still requiring 2 person assist for any mobility beyond lateral transfers.    Follow Up Recommendations  Home health OT;Supervision/Assistance - 24 hour    Equipment Recommendations  None recommended by OT    Recommendations for Other Services Rehab consult    Precautions / Restrictions Precautions Precautions: Fall       Mobility Bed Mobility Overal bed mobility: Needs Assistance Bed Mobility: Supine to Sit     Supine to sit: Supervision;HOB elevated        Transfers Overall transfer level: Needs assistance Equipment used: None Transfers: Lateral/Scoot Transfers;Squat Pivot Transfers     Squat pivot transfers: Mod assist;+2 physical assistance;+2 safety/equipment    Lateral/Scoot Transfers: Min guard;Supervision General transfer comment: eob to 3n1 (R), 3n1 to eob (L), eob to recliner (L) (cna assisted as +2)    Balance                                           ADL either performed or assessed with clinical judgement   ADL Overall ADL's : Needs assistance/impaired     Grooming: Wash/dry hands;Sitting;Set up                   Toilet Transfer: Moderate assistance;+2 for physical assistance;+2 for safety/equipment;Squat-pivot;BSC;Requires drop arm   Toileting- Clothing Manipulation and Hygiene: Maximal assistance;Sit to/from stand       Functional  mobility during ADLs: Moderate assistance;+2 for physical assistance;+2 for safety/equipment General ADL Comments: decreased access to LB due to new sx, potentially cog deficits, blanace, safety awareness,     Vision       Perception     Praxis      Cognition Arousal/Alertness: Awake/alert Behavior During Therapy: WFL for tasks assessed/performed Overall Cognitive Status: Within Functional Limits for tasks assessed                                          Exercises     Shoulder Instructions       General Comments      Pertinent Vitals/ Pain       Faces Pain Scale: Hurts even more Pain Location: back Pain Descriptors / Indicators: Grimacing;Guarding  Home Living                                          Prior Functioning/Environment              Frequency  Min 3X/week        Progress Toward Goals  OT Goals(current goals can now be found in the care plan section)  Progress towards OT goals: Progressing toward goals  Plan Discharge plan needs to be updated    Co-evaluation                 AM-PAC OT "6 Clicks" Daily Activity     Outcome Measure   Help from another person eating meals?: None Help from another person taking care of personal grooming?: A Little Help from another person toileting, which includes using toliet, bedpan, or urinal?: A Lot Help from another person bathing (including washing, rinsing, drying)?: A Little Help from another person to put on and taking off regular upper body clothing?: A Little Help from another person to put on and taking off regular lower body clothing?: A Little 6 Click Score: 18    End of Session    OT Visit Diagnosis: Unsteadiness on feet (R26.81);Other abnormalities of gait and mobility (R26.89);Pain Pain - Right/Left: Right Pain - part of body: Knee;Leg   Activity Tolerance Patient tolerated treatment well   Patient Left in chair;with call bell/phone  within reach;with nursing/sitter in room   Nurse Communication Other (comment)(told rn small skin tear present on top of L hand, also reviewed need for drop arm 3n1)        Time: [id number] OT Time Calculation (min): 22 min  Charges: OT General Charges $OT Visit: 1 Visit OT Treatments $Self Care/Home Management : 8-22 mins  Robet LeuMorris, Rico Massar Lorraine, COTA/L 02/01/2019, 11:29 AM

## 2019-02-02 ENCOUNTER — Telehealth: Payer: Self-pay | Admitting: Physical Medicine & Rehabilitation

## 2019-02-02 LAB — GLUCOSE, CAPILLARY
Glucose-Capillary: 135 mg/dL — ABNORMAL HIGH (ref 70–99)
Glucose-Capillary: 190 mg/dL — ABNORMAL HIGH (ref 70–99)

## 2019-02-02 NOTE — TOC Transition Note (Signed)
Transition of Care [facility]) - CM/SW Discharge Note Donn Pierini RN, BSN Transitions of Care Unit 4E- RN Case Manager [phone]   Patient Details  Name: NIGUEL HARGAN MRN: [id number] Date of Birth: 06-14-1951  Transition of Care [facility]) CM/SW Contact:  Darrold Span, RN Phone Number: 02/02/2019, 3:15 PM   Clinical Narrative:    Pt stable for transition home, pt states he is functioning with transfers at baseline and that he has all needed DME at home including transfer sliding board. Pt reports that he has been non-ambulatory since Nov. 2019. HH services have been active in the home for therapies. Orders were faxed on 5/15 to [facility] for resumption of care. Call made today to [facility] to notify of discharge home today they will f/u for restart of [facility] services, d/c summary faxed via epic to [phone]. Wife plans to come and transport pt home.    Final next level of care: Home w Home Health Services Barriers to Discharge: No Barriers Identified   Patient Goals and CMS Choice Patient states their goals for this hospitalization and ongoing recovery are:: "home or whatever my wife feels is best" CMS Medicare.gov Compare Post Acute Care list provided to:: Patient Choice offered to / list presented to : Spouse  Discharge Placement  Home with wife and [facility] services.                 Name of family member notified: Lupita Leash Patient and family notified of of transfer: 02/02/19  Discharge Plan and Services   Discharge Planning Services: CM Consult Post Acute Care Choice: Home Health          DME Arranged: N/A DME Agency: NA       HH Arranged: PT, OT HH [facility] Date [facility] Agency Contacted: 02/02/19 Time HH Agency Contacted: 1430 Representative spoke with at [facility] Agency: Misty Stanley  Social Determinants of Health (SDOH) Interventions     Readmission Risk Interventions No flowsheet data found.

## 2019-02-02 NOTE — Progress Notes (Addendum)
  Progress Note    02/02/2019 7:18 AM 6 Days Post-Op  Subjective:  No complaints; says he worked with PT yesterday to [facility] and to chair.    Afebrile HR 70's-90's NSR 110's-120's systolic 100% RA  Vitals:   02/01/19 2351 02/02/19 0456  BP: 118/60 (!) 116/59  Pulse: 86   Resp: (!) 24 (!) 22  Temp: 97.8 F (36.6 C) 98.8 F (37.1 C)  SpO2: 98% 95%    Physical Exam: Cardiac:  regular Lungs:  Non labored Incisions:  Healing nicely Extremities:  Right lateral malleolus wound clean; brisk peroneal > PT  CBC    Component Value Date/Time   WBC 7.3 01/28/2019 0728   RBC 2.85 (L) 01/28/2019 0728   HGB 8.2 (L) 01/28/2019 0728   HCT 24.5 (L) 01/28/2019 0728   PLT 186 01/28/2019 0728   MCV 86.0 01/28/2019 0728   MCH 28.8 01/28/2019 0728   MCHC 33.5 01/28/2019 0728   RDW 13.2 01/28/2019 0728   LYMPHSABS 1.6 08/20/2018 0607   MONOABS 2.3 (H) 08/20/2018 0607   EOSABS 0.1 08/20/2018 0607   BASOSABS 0.1 08/20/2018 0607    BMET    Component Value Date/Time   NA 138 01/28/2019 0728   K 3.6 01/28/2019 0728   CL 102 01/28/2019 0728   CO2 22 01/28/2019 0728   GLUCOSE 94 01/28/2019 0728   BUN 24 (H) 01/28/2019 0728   CREATININE 0.99 01/28/2019 0728   CALCIUM 8.7 (L) 01/28/2019 0728   GFRNONAA >60 01/28/2019 0728   GFRAA >60 01/28/2019 0728    INR    Component Value Date/Time   INR 1.0 01/27/2019 0913     Intake/Output Summary (Last 24 hours) at 02/02/2019 0718 Last data filed at 02/02/2019 0500 Gross per 24 hour  Intake 1437 ml  Output 1620 ml  Net -183 ml     Assessment:  68 y.o. 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  Per PT note on 5/15, PT signed off as the pt's wife felt she did not want him to go to any facility.  Will ask PT to come back and work with pt as he is not motivated to get out of bed.  -DVT prophylaxis:  Sq heparin.   Doreatha Massed, [name] [facility] [phone] 02/02/2019 7:18 AM   I have independently interviewed and examined patient and agree with PA assessment and plan above. Discussed progress with patient's wife and he does have home health nursing and sees wound care in [place]. Would also plan for outpatient rehab as patient and wife do not want him to go to rehab or SNF.  Mera Gunkel C. Randie Heinz, MD [facility] [place] Office: [phone] Pager: [phone]

## 2019-02-02 NOTE — Telephone Encounter (Signed)
I'll try to get by tomorrow afternoon. Looks as if he might be going home soon., however.   Ranelle Oyster, MD, [facility] Physical Medicine & Rehabilitation 02/02/2019

## 2019-02-02 NOTE — Progress Notes (Signed)
Occupational Therapy Treatment Patient Details Name: Brandon Collins MRN: [id number] DOB: 11/13/1950 Today's Date: 02/02/2019    History of present illness Ptis a 68 y.o.malewith history of recent cervical surgery (08/12/2018). Pt has a PMH including Arthritis, High cholesterol, HOH, HTN, and Type II diabetes mellitus. 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   OT comments  Pt progressing towards OT goals this session. Pt seen with last session focusing on dressing for anticipated dc home where he will be assisted by wife. Pt was able to don UB and LB at min guard. Pt utilizing good compensatory strategies for LB dressing at bed level, and going back and forth between sitting<>supine. Pt with heavy use of bed rails throughout session even though he does not have these - "my wife will help me" able to perform transfer to recliner at supervision level. Pt remains with deficits into insight/awareness and safety but is confident that wife will be able to assist at current level. Strongly suggest HHOT follow up.    Follow Up Recommendations  Home health OT;Supervision/Assistance - 24 hour    Equipment Recommendations  None recommended by OT    Recommendations for Other Services      Precautions / Restrictions Precautions Precautions: Fall       Mobility Bed Mobility Overal bed mobility: Needs Assistance Bed Mobility: Supine to Sit;Sit to Supine     Supine to sit: Supervision;HOB elevated Sit to supine: Min guard   General bed mobility comments: Pt requires increased time, use of bed rail for multiple supine<>sit during dressing  Transfers Overall transfer level: Needs assistance Equipment used: None Transfers: Lateral/Scoot Transfers          Lateral/Scoot Transfers: Supervision General transfer comment: supervision for safety     Balance Overall balance assessment: Needs assistance Sitting-balance support:  Single extremity supported;Feet supported Sitting balance-Leahy Scale: Fair                                     ADL either performed or assessed with clinical judgement   ADL Overall ADL's : Needs assistance/impaired                 Upper Body Dressing : Set up;Sitting Upper Body Dressing Details (indicate cue type and reason): seated EOB Lower Body Dressing: Set up;Bed level Lower Body Dressing Details (indicate cue type and reason): got dressed for DC home. Pt utilized LB compensatory strategies Toilet Transfer: Radiographer, therapeutic Details (indicate cue type and reason): simulated through recliner transfer           General ADL Comments: Pt anxious to get home to wife. Continue to see deficits in safety awareness, problem solving, and overall ADL     Vision       Perception     Praxis      Cognition Arousal/Alertness: Awake/alert Behavior During Therapy: WFL for tasks assessed/performed Overall Cognitive Status: Impaired/Different from baseline Area of Impairment: Safety/judgement;Awareness;Problem solving                         Safety/Judgement: Decreased awareness of safety;Decreased awareness of deficits Awareness: Emergent Problem Solving: Requires verbal cues General Comments: Poor awareness of safety/deficits.        Exercises     Shoulder Instructions       General Comments R knee continues to be painful  Pertinent Vitals/ Pain       Pain Assessment: Faces Faces Pain Scale: Hurts even more Pain Location: RLE with movement and back Pain Descriptors / Indicators: Grimacing;Guarding;Sore;Aching;Tender Pain Intervention(s): Monitored during session;Repositioned  Home Living                                          Prior Functioning/Environment              Frequency  Min 3X/week        Progress Toward Goals  OT Goals(current goals can now be found in the care plan  section)  Progress towards OT goals: Progressing toward goals  Acute Rehab OT Goals Patient Stated Goal: to be able to walk again OT Goal Formulation: With patient Time For Goal Achievement: 02/11/19 Potential to Achieve Goals: Good  Plan Discharge plan remains appropriate;Frequency remains appropriate    Co-evaluation                 AM-PAC OT "6 Clicks" Daily Activity     Outcome Measure   Help from another person eating meals?: None Help from another person taking care of personal grooming?: A Little Help from another person toileting, which includes using toliet, bedpan, or urinal?: A Little Help from another person bathing (including washing, rinsing, drying)?: A Little Help from another person to put on and taking off regular upper body clothing?: A Little Help from another person to put on and taking off regular lower body clothing?: A Little 6 Click Score: 19    End of Session    OT Visit Diagnosis: Unsteadiness on feet (R26.81);Other abnormalities of gait and mobility (R26.89);Pain Pain - Right/Left: Right Pain - part of body: Knee;Leg   Activity Tolerance Patient tolerated treatment well   Patient Left in chair;with call bell/phone within reach   Nurse Communication Mobility status(dressed for dc)        Time: 1553-1610 OT Time Calculation (min): 17 min  Charges: OT General Charges $OT Visit: 1 Visit OT Treatments $Self Care/Home Management : 8-22 mins  Sherryl MangesLaura Derricka Mertz OTR/L Acute Rehabilitation Services Pager: [phone] Office: [phone]   Evern BioLaura J Kensley Valladares 02/02/2019, 5:03 PM

## 2019-02-02 NOTE — Progress Notes (Signed)
Pt discharged home with wife. Pt received discharge instructions and all questions were answered. Pt instructed too pick up his pain medication at his pharmacy. Pt verbalized understanding. Pt left with all of his belongings. Pt discharged via wheelchair and was accompanied by this RN.   Ardeen Jourdain BSN, RN

## 2019-02-02 NOTE — Progress Notes (Signed)
Physical Therapy Treatment Patient Details Name: Brandon OchsCarl S Januszewski MRN: [id number] DOB: 1950/09/24 Today's Date: 02/02/2019    History of Present Illness Ptis a 68 y.o.malewith history of recent cervical surgery (08/12/2018). Pt has a PMH including Arthritis, High cholesterol, HOH, HTN, and Type II diabetes mellitus. 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    PT Comments    Patient maintaining mobility. Lengthy discussion with pt re: PLOF, mobility at home, assist needed etc prior to admission to hospital. Able to perform lateral scoot transfers with supervision for safety today. Pt performed transfer in both directions x2 without difficulty. Reports this is how he was doing them at home PTA. Pt discussed how to perform all transfers within home environment as well as car transfer using slide board and assist of wife. Per notes, wife and pt want him to return home so discharge recommendation updated to home with HHPT. Pt continues to demonstrate pain in RLE and generalized weakness so not quite functioning at baseline so would benefit from HHPT to work on more complex transfers and using slide board. Will follow acutely.   Follow Up Recommendations  Home health PT;Supervision for mobility/OOB;Supervision/Assistance - 24 hour     Equipment Recommendations  None recommended by PT    Recommendations for Other Services       Precautions / Restrictions Precautions Precautions: Fall Restrictions Weight Bearing Restrictions: No    Mobility  Bed Mobility Overal bed mobility: Needs Assistance Bed Mobility: Supine to Sit     Supine to sit: Supervision;HOB elevated     General bed mobility comments: Able to bring RLE to EOB and heavy use of rail which pt reports he uses his dresser at home; would not let PT lower HOB despite cues he did not have this at home.  Transfers Overall transfer level: Needs assistance Equipment  used: None Transfers: Lateral/Scoot Transfers          Lateral/Scoot Transfers: Supervision General transfer comment: Supervision for lateral scoot transfers from bed to/from chair x2 going both directions. Explained/demonstrated how he does car transfers with help of wife and slide board and transfers at home to/from toilet.  Ambulation/Gait             General Gait Details: has not been ambulating   Optometristtairs             Wheelchair Mobility    Modified Rankin (Stroke Patients Only)       Balance Overall balance assessment: Needs assistance Sitting-balance support: Single extremity supported;Feet supported Sitting balance-Leahy Scale: Fair                                      Cognition Arousal/Alertness: Awake/alert Behavior During Therapy: WFL for tasks assessed/performed Overall Cognitive Status: Impaired/Different from baseline Area of Impairment: Safety/judgement;Awareness                         Safety/Judgement: Decreased awareness of safety;Decreased awareness of deficits Awareness: Emergent   General Comments: Poor awareness of safety/deficits.      Exercises      General Comments General comments (skin integrity, edema, etc.): RLE painful and tender to touch. Limited knee extension- reports this has been the case for a few weeks. Pt continues to place pillow under knee despite education on importance of positioning and gaining full knee AROM.  Pertinent Vitals/Pain Pain Assessment: Faces Faces Pain Scale: Hurts even more Pain Location: RLE with movement and back Pain Descriptors / Indicators: Grimacing;Guarding;Sore;Aching;Tender Pain Intervention(s): Monitored during session;Repositioned;Limited activity within patient's tolerance    Home Living                      Prior Function            PT Goals (current goals can now be found in the care plan section) Progress towards PT goals: Progressing  toward goals    Frequency    Min 3X/week      PT Plan Discharge plan needs to be updated    Co-evaluation              AM-PAC PT "6 Clicks" Mobility   Outcome Measure  Help needed turning from your back to your side while in a flat bed without using bedrails?: A Little Help needed moving from lying on your back to sitting on the side of a flat bed without using bedrails?: A Little Help needed moving to and from a bed to a chair (including a wheelchair)?: A Little Help needed standing up from a chair using your arms (e.g., wheelchair or bedside chair)?: Total Help needed to walk in hospital room?: Total Help needed climbing 3-5 steps with a railing? : Total 6 Click Score: 12    End of Session   Activity Tolerance: Patient tolerated treatment well Patient left: in chair;with call bell/phone within reach;with chair alarm set Nurse Communication: Mobility status PT Visit Diagnosis: Unsteadiness on feet (R26.81);Muscle weakness (generalized) (M62.81);Pain Pain - Right/Left: Right Pain - part of body: Leg(back)     Time: 0258-5277 PT Time Calculation (min) (ACUTE ONLY): 25 min  Charges:  $Therapeutic Activity: 8-22 mins $Self Care/Home Management: 8-22                     Mylo Red, PT, DPT Acute Rehabilitation Services Pager [phone] Office [phone]       Blake Divine A Lanier Ensign 02/02/2019, 10:56 AM

## 2019-02-02 NOTE — Telephone Encounter (Signed)
Patient/s wife  would like to know if Dr. Riley Kill could come by and see the patient while he is in the hospital.  I did explain to them I didn't know if he would have time, but would let him know.

## 2019-02-02 NOTE — Discharge Summary (Signed)
Discharge Summary     Brandon Collins 11-25-1950 68 y.o. male  [id number]  Admission Date: 01/27/2019  Discharge Date: 02/02/2019  Physician: Juventino Slovak*  Admission Diagnosis: PERIPHERAL ARTERY DISEASE RIGHT LEG   HPI:   This is a 68 y.o. male with history of recent neck surgery. Has history of diabetes was previously a smoker but quit at the birth of his grandson. Currently does not walk but did walk prior to this procedure. Now has wound on his right lateral ankle. Is never had DVT. Does have swelling of his legs worse with dependent position. Being treated at the wound care center in [place]. Has not had fevers or chills.  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  Hospital Course:  The patient was admitted to the hospital and taken to the operating room on 01/27/2019 and underwent: 1.  Right external iliac artery, common femoral artery and profunda femoris artery endarterectomy with vein patch angioplasty 2.  Harvest of right greater saphenous vein 3.  Right common femoral to below-knee popliteal artery bypass with 6 mm ringed PTFE    Findings:  Common femoral artery was subtotally occluded heavily calcified.  This extended up onto the external iliac artery.  After endarterectomy we used an accessory greater saphenous vein for patch angioplasty.  Saphenous vein was harvested from common femoral artery to below-knee knee unfortunately had 2 areas of sclerosis this was not suitable.  The bypass comes off of the common femoral vein patch there is a hood of a bypass vein and interposition PTFE that is sewn directly to the below-knee popliteal artery which was healthy.  At completion there was strong posterior tibial signal at the ankle that did augment with compression of the graft.  The pt tolerated the procedure well and was transported to the PACU in good condition.   By POD 1, pt had a brisk left  DP/PT doppler signals.  This was maintained throughout stay.  His wounds are healing nicely throughout as well.   Pt was evaluated by PT and it was determined the pt will discharge home as he has all HH needs arranged prior to admission.   His wife is in agreement with this.  Dr. Randie Heinz talked with wife, and he sees wound care in [place] and [facility] RN arranged.  Continue current wound dressing.  The remainder of the hospital course consisted of increasing mobilization and increasing intake of solids without difficulty.  He worked with PT and continues with transfers, which is at baseline.  Will continue HHPT at discharge.    CBC    Component Value Date/Time   WBC 7.3 01/28/2019 0728   RBC 2.85 (L) 01/28/2019 0728   HGB 8.2 (L) 01/28/2019 0728   HCT 24.5 (L) 01/28/2019 0728   PLT 186 01/28/2019 0728   MCV 86.0 01/28/2019 0728   MCH 28.8 01/28/2019 0728   MCHC 33.5 01/28/2019 0728   RDW 13.2 01/28/2019 0728   LYMPHSABS 1.6 08/20/2018 0607   MONOABS 2.3 (H) 08/20/2018 0607   EOSABS 0.1 08/20/2018 0607   BASOSABS 0.1 08/20/2018 0607    BMET    Component Value Date/Time   NA 138 01/28/2019 0728   K 3.6 01/28/2019 0728   CL 102 01/28/2019 0728   CO2 22 01/28/2019 0728   GLUCOSE 94 01/28/2019 0728   BUN 24 (H) 01/28/2019 0728   CREATININE 0.99 01/28/2019 0728   CALCIUM 8.7 (L) 01/28/2019 0728   GFRNONAA >60 01/28/2019  0211   GFRAA >60 01/28/2019 0728     Discharge Instructions    Call MD for:  redness, tenderness, or signs of infection (pain, swelling, bleeding, redness, odor or green/yellow discharge around incision site)   Complete by:  As directed    Call MD for:  severe or increased pain, loss or decreased feeling  in affected limb(s)   Complete by:  As directed    Call MD for:  temperature >100.5   Complete by:  As directed    Discharge patient   Complete by:  As directed    Discharge disposition:  01-Home or Self Care   Discharge patient date:  02/02/2019   Resume previous  diet   Complete by:  As directed       Discharge Diagnosis:  PERIPHERAL ARTERY DISEASE RIGHT LEG  Secondary Diagnosis: Patient Active Problem List   Diagnosis Date Noted   PAD (peripheral artery disease) (HCC) 01/27/2019   Ankle ulcer, right, limited to breakdown of skin (HCC) 10/21/2018   Primary osteoarthritis of right knee 10/21/2018   Hyponatremia    Pain    Cervical myelopathy (HCC) 08/19/2018   Cervical cord compression with myelopathy (HCC)    Postoperative pain    Diabetes mellitus type 2 in nonobese [facility])    Essential hypertension    Dyslipidemia    Drug induced constipation    Myelopathy (HCC) 08/12/2018   Past Medical History:  Diagnosis Date   Arthritis    "all over" (08/12/2018)   Complication of anesthesia 07/2018   "out of it for 2 weeks."  "Low Blood Sugar."   High cholesterol    HOH (hard of hearing)    Hypertension    Type II diabetes mellitus (HCC)      Allergies as of 02/02/2019   No Known Allergies     Medication List    TAKE these medications   acetaminophen 325 MG tablet Commonly known as:  TYLENOL Take 2 tablets (650 mg total) by mouth every 4 (four) hours as needed for mild pain ((score 1 to 3) or temp > 100.5).   amLODipine 10 MG tablet Commonly known as:  NORVASC Take 10 mg by mouth daily.   aspirin EC 81 MG tablet Take 81 mg by mouth daily.   atorvastatin 20 MG tablet Commonly known as:  LIPITOR Take 20 mg by mouth 4 (four) times a week. Evern Bio, Thur, and Sat   baclofen 10 MG tablet Commonly known as:  LIORESAL Take 1 tablet (10 mg total) by mouth 3 (three) times daily as needed for muscle spasms.   cholecalciferol 1000 units tablet Commonly known as:  VITAMIN D Take 1 tablet (1,000 Units total) by mouth daily.   cloNIDine 0.1 MG tablet Commonly known as:  CATAPRES Take 0.1 mg by mouth 2 (two) times daily.   collagenase ointment Commonly known as:  SANTYL Apply 1 application topically daily. To  right ankle   diclofenac sodium 1 % Gel Commonly known as:  VOLTAREN Apply 1 application topically 3 (three) times daily as needed.   feeding supplement (ENSURE ENLIVE) Liqd Take 237 mLs by mouth 2 (two) times daily between meals.   glimepiride 4 MG tablet Commonly known as:  AMARYL Take 1 tablet (4 mg total) by mouth every evening.   hydrochlorothiazide 25 MG tablet Commonly known as:  HYDRODIURIL Take 25 mg by mouth daily.   lisinopril 20 MG tablet Commonly known as:  ZESTRIL Take 20 mg by mouth daily.  multivitamin with minerals Tabs tablet Take 1 tablet by mouth daily.   pantoprazole 40 MG tablet Commonly known as:  PROTONIX Take 1 tablet (40 mg total) by mouth daily with supper.   pioglitazone 15 MG tablet Commonly known as:  ACTOS Take 15 mg by mouth every morning.   traMADol 50 MG tablet Commonly known as:  ULTRAM Take 1 tablet (50 mg total) by mouth every 6 (six) hours as needed for severe pain. What changed:  Another medication with the same name was added. Make sure you understand how and when to take each.   traMADol 50 MG tablet Commonly known as:  ULTRAM Take 1 tablet (50 mg total) by mouth every 6 (six) hours as needed for severe pain. What changed:  You were already taking a medication with the same name, and this prescription was added. Make sure you understand how and when to take each.       Discharge Instructions: [facility] Discharge instructions Lower Extremity Bypass Surgery  Please refer to the following instruction for your post-procedure care. Your surgeon or physician assistant will discuss any changes with you.  Activity  You are encouraged to walk as much as you can. You can slowly return to normal activities during the month after your surgery. Avoid strenuous activity and heavy lifting until your doctor tells you it's OK. Avoid activities such as vacuuming or swinging a golf club. Do not drive until  your doctor give the OK and you are no longer taking prescription pain medications. It is also normal to have difficulty with sleep habits, eating and bowel movement after surgery. These will go away with time.  Bathing/Showering  You may shower after you go home. Do not soak in a bathtub, hot tub, or swim until the incision heals completely.  Incision Care  Clean your incision with mild soap and water. Shower every day. Pat the area dry with a clean towel. You do not need a bandage unless otherwise instructed. Do not apply any ointments or creams to your incision. If you have open wounds you will be instructed how to care for them or a visiting nurse may be arranged for you. If you have staples or sutures along your incision they will be removed at your post-op appointment. You may have skin glue on your incision. Do not peel it off. It will come off on its own in about one week.  Wash the groin wound with soap and water daily and pat dry. (No tub bath-only shower)  Then put a dry gauze or washcloth in the groin to keep this area dry to help prevent wound infection.  Do this daily and as needed.  Do not use Vaseline or neosporin on your incisions.  Only use soap and water on your incisions and then protect and keep dry.  Diet  Resume your normal diet. There are no special food restrictions following this procedure. A low fat/ low cholesterol diet is recommended for all patients with vascular disease. In order to heal from your surgery, it is CRITICAL to get adequate nutrition. Your body requires vitamins, minerals, and protein. Vegetables are the best source of vitamins and minerals. Vegetables also provide the perfect balance of protein. Processed food has little nutritional value, so try to avoid this.  Medications  Resume taking all your medications unless your doctor or Physician Assistant tells you not to. If your incision is causing pain, you may take over-the-counter pain relievers such as  acetaminophen (Tylenol). If you were prescribed a stronger pain medication, please aware these medication can cause nausea and constipation. Prevent nausea by taking the medication with a snack or meal. Avoid constipation by drinking plenty of fluids and eating foods with high amount of fiber, such as fruits, vegetables, and grains. Take Colace 100 mg (an over-the-counter stool softener) twice a day as needed for constipation.  Do not take Tylenol if you are taking prescription pain medications.  Follow Up  Our office will schedule a follow up appointment 2-3 weeks following discharge.  Please call us immediately for any of the following conditions  Severe or worsening pain in your legs or feet while at rest or while walking Increase pain, redness, warmth, or drainage (pus) from your incision site(s)  Fever of 101 degree or higher  The swelling in your leg with the bypass suddenly worsens and becomes more painful than when you were in the hospital  If you have been instructed to feel your graft pulse then you should do so every day. If you can no longer feel this pulse, call the office immediately. Not all patients are given this instruction.   Leg swelling is common after leg bypass surgery.  The swelling should improve over a few months following surgery. To improve the swelling, you may elevate your legs above the level of your heart while you are sitting or resting. Your surgeon or physician assistant may ask you to apply an ACE wrap or wear compression (TED) stockings to help to reduce swelling.  Reduce your risk of vascular disease  Stop smoking. If you would like help call [facility] at 1-800-QUIT-NOW (445-021-93641-608-027-0008) or [facility] at [phone].   Manage your cholesterol  Maintain a desired weight  Control your diabetes weight  Control your diabetes  Keep your blood pressure down   If you have any questions, please call the office at [phone]   Prescriptions  given: 1.  Tramadol 50mg   #30 No Refill  Disposition: home with HH  Patient's condition: is Good  Follow up: 1. Dr. Randie Heinzain in 2-3 weeks   Doreatha MassedSamantha Marc Sivertsen, [name] [facility] [phone] 02/02/2019  2:25 PM  - For VQI Registry use ---   Post-op:  Wound infection: No  Graft infection: No  Transfusion: No    If yes, n/a units given New Arrhythmia: No Ipsilateral amputation: No, [ ]  Minor, [ ]  BKA, [ ]  AKA Discharge patency: [x ] Primary, [ ]  Primary assisted, [ ]  Secondary, [ ]  Occluded Patency judged by: [x ] Dopper only, [ ]  Palpable graft pulse, []  Palpable distal pulse, [ ]  ABI inc. > 0.15, [ ]  Duplex Discharge ABI: R 1.06, L 0.58 D/C Ambulatory Status: transfers with assist  Complications: MI: No, [ ]  Troponin only, [ ]  EKG or Clinical CHF: No Resp failure:No, [ ]  Pneumonia, [ ]  Ventilator Chg in renal function: No, [ ]  Inc. Cr > 0.5, [ ]  Temp. Dialysis,  [ ]  Permanent dialysis Stroke: No, [ ]  Minor, [ ]  Major Return to OR: No  Reason for return to OR: [ ]  Bleeding, [ ]  Infection, [ ]  Thrombosis, [ ]  Revision  Discharge medications: Statin use:  yes ASA use:  yes Plavix use:  no Beta blocker use: no CCB use:  Yes ACEI use:   yes ARB use:  no Coumadin use: no

## 2019-02-03 ENCOUNTER — Encounter: Payer: Medicare HMO | Admitting: Physical Medicine & Rehabilitation

## 2019-02-12 ENCOUNTER — Telehealth: Payer: Self-pay

## 2019-02-12 NOTE — Telephone Encounter (Signed)
Wife called to see if he needed an antibiotic before he has dental work done today.   Called and advised her that he did not.   Julious Payer, CMA

## 2019-02-20 ENCOUNTER — Encounter: Payer: Medicare HMO | Admitting: Vascular Surgery

## 2019-02-27 ENCOUNTER — Other Ambulatory Visit: Payer: Self-pay

## 2019-02-27 ENCOUNTER — Ambulatory Visit (INDEPENDENT_AMBULATORY_CARE_PROVIDER_SITE_OTHER): Payer: Medicare HMO | Admitting: Vascular Surgery

## 2019-02-27 ENCOUNTER — Encounter: Payer: Self-pay | Admitting: Vascular Surgery

## 2019-02-27 VITALS — BP 104/62 | HR 67 | Temp 97.0°F | Resp 20 | Ht 72.0 in | Wt 227.0 lb

## 2019-02-27 DIAGNOSIS — I739 Peripheral vascular disease, unspecified: Secondary | ICD-10-CM

## 2019-02-27 NOTE — Progress Notes (Signed)
    Subjective:     Patient ID: Brandon Collins, male   DOB: [birth year], 68 y.o.   MRN: [id number]  HPI 68 year old male follows up for extensive right common femoral endarterectomy with right common femoral to below-knee artery bypass with vein.  He is on aspirin and statin.  ABI improved postoperatively.  Has significant swelling the right lower extremity has been seen in wound care in [place] where he is followed 4 times weekly.  Is also getting hyperbaric oxygen at this point.  Not having any significant pain.  Still has staples in place.   Review of Systems Right lower extremity swelling and weeping    Objective:   Physical Exam Vitals:   02/27/19 1215  BP: 104/62  Pulse: 67  Resp: 20  Temp: (!) 97 F (36.1 C)  SpO2: 100%  Awake alert oriented Nonlabored respirations Incisions are healing well Staples removed from below-knee incisions There is significant right lower extremity swelling. There is a strong posterior tibial signal on the right Toes appear healthy      Assessment:     68 year old male status post right femoropopliteal bypass with graft for right extremity ulceration.  He is seeing wound care and undergoing hyperbaric oxygen at this point.   Plan:     Plan will be to continue aspirin and statin.  Follow-up in 6 to 8 weeks for right lower extremity duplex and ABIs.     Jachin Coury C. [name], MD [facility] [place] Office: [phone] Pager: [phone]

## 2019-03-03 ENCOUNTER — Other Ambulatory Visit: Payer: Self-pay

## 2019-03-03 ENCOUNTER — Encounter: Payer: Self-pay | Admitting: Podiatry

## 2019-03-03 ENCOUNTER — Ambulatory Visit (INDEPENDENT_AMBULATORY_CARE_PROVIDER_SITE_OTHER): Payer: Medicare HMO | Admitting: Podiatry

## 2019-03-03 VITALS — Temp 97.0°F | Resp 16

## 2019-03-03 DIAGNOSIS — B351 Tinea unguium: Secondary | ICD-10-CM | POA: Diagnosis not present

## 2019-03-03 DIAGNOSIS — E119 Type 2 diabetes mellitus without complications: Secondary | ICD-10-CM | POA: Diagnosis not present

## 2019-03-03 DIAGNOSIS — E1169 Type 2 diabetes mellitus with other specified complication: Secondary | ICD-10-CM

## 2019-03-03 DIAGNOSIS — E1151 Type 2 diabetes mellitus with diabetic peripheral angiopathy without gangrene: Secondary | ICD-10-CM | POA: Diagnosis not present

## 2019-03-03 DIAGNOSIS — L97919 Non-pressure chronic ulcer of unspecified part of right lower leg with unspecified severity: Secondary | ICD-10-CM

## 2019-03-03 DIAGNOSIS — I83019 Varicose veins of right lower extremity with ulcer of unspecified site: Secondary | ICD-10-CM

## 2019-03-03 NOTE — Progress Notes (Signed)
   Subjective:    Patient ID: Brandon Collins, male    DOB: Sep 20, 1950, 68 y.o.   MRN: [id number]  HPI    Review of Systems  Skin: Positive for wound.  All other systems reviewed and are negative.      Objective:   Physical Exam        Assessment & Plan:

## 2019-03-16 NOTE — Progress Notes (Signed)
Subjective:  Patient ID: Brandon Collins, male    DOB: 06/07/1951,  MRN: [id number]  Chief Complaint  Patient presents with  . \debride    Routine diabetic foot care -pt deneis N/V/F/Ch   . Diabetes    FBS: 141 A1C: >7 PCP: [place] x 3 mo     68 y.o. male presents  for diabetic foot care. Last AMBS was 141. Reports numbness and tingling in their feet. Reports cramping in legs and thighs.  Review of Systems: Negative except as noted in the HPI. Denies N/V/F/Ch.  Past Medical History:  Diagnosis Date  . Arthritis    "all over" (08/12/2018)  . Complication of anesthesia 07/2018   "out of it for 2 weeks."  "Low Blood Sugar."  . High cholesterol   . HOH (hard of hearing)   . Hypertension   . Type II diabetes mellitus (HCC)     Current Outpatient Medications:  .  acetaminophen (TYLENOL) 325 MG tablet, Take 2 tablets (650 mg total) by mouth every 4 (four) hours as needed for mild pain ((score 1 to 3) or temp > 100.5)., Disp: , Rfl:  .  amLODipine (NORVASC) 10 MG tablet, Take 10 mg by mouth daily., Disp: , Rfl:  .  aspirin EC 81 MG tablet, Take 81 mg by mouth daily., Disp: , Rfl:  .  atorvastatin (LIPITOR) 20 MG tablet, Take 20 mg by mouth 4 (four) times a week. [name], Thur, and Sat, Disp: , Rfl: 1 .  baclofen (LIORESAL) 10 MG tablet, Take 1 tablet (10 mg total) by mouth 3 (three) times daily as needed for muscle spasms., Disp: 30 each, Rfl: 0 .  cholecalciferol (VITAMIN D) 1000 units tablet, Take 1 tablet (1,000 Units total) by mouth daily., Disp: , Rfl:  .  cloNIDine (CATAPRES) 0.1 MG tablet, Take 0.1 mg by mouth 2 (two) times daily., Disp: , Rfl:  .  collagenase (SANTYL) ointment, Apply 1 application topically daily. To right ankle, Disp: 30 g, Rfl: 2 .  feeding supplement, ENSURE ENLIVE, (ENSURE ENLIVE) LIQD, Take 237 mLs by mouth 2 (two) times daily between meals., Disp: 237 mL, Rfl: 12 .  glimepiride (AMARYL) 4 MG tablet, Take 1 tablet (4 mg total) by mouth every evening.,  Disp: , Rfl:  .  hydrochlorothiazide (HYDRODIURIL) 25 MG tablet, Take 25 mg by mouth daily., Disp: , Rfl:  .  lisinopril (ZESTRIL) 20 MG tablet, Take 20 mg by mouth daily., Disp: , Rfl:  .  Multiple Vitamin (MULTIVITAMIN WITH MINERALS) TABS tablet, Take 1 tablet by mouth daily., Disp: , Rfl:  .  pantoprazole (PROTONIX) 40 MG tablet, Take 1 tablet (40 mg total) by mouth daily with supper., Disp: , Rfl:  .  pioglitazone (ACTOS) 15 MG tablet, Take 15 mg by mouth every morning. , Disp: , Rfl:  .  traMADol (ULTRAM) 50 MG tablet, Take 1 tablet (50 mg total) by mouth every 6 (six) hours as needed for severe pain., Disp: 30 tablet, Rfl: 0  Social History   Tobacco Use  Smoking Status Former Smoker  . Packs/day: 1.00  . Years: 30.00  . Pack years: 30.00  . Types: Cigarettes  Smokeless Tobacco Never Used  Tobacco Comment   quit in late 1990's    No Known Allergies Objective:   Vitals:   03/03/19 1514  Resp: 16  Temp: (!) 97 F (36.1 C)   There is no height or weight on file to calculate BMI. Constitutional Well developed. Well nourished.  Vascular Dorsalis pedis pulses present 1+ bilaterally  Posterior tibial pulses absent bilaterally  Pedal hair growth absent. Capillary refill normal to all digits.  No cyanosis or clubbing noted.  Neurologic Normal speech. Oriented to person, place, and time. Epicritic sensation to light touch grossly present bilaterally. Protective sensation with 5.07 monofilament  present bilaterally. Vibratory sensation present bilaterally.  Dermatologic Nails elongated, thickened, dystrophic. R leg with dressed wound  Orthopedic: Normal joint ROM without pain or crepitus bilaterally. No visible deformities. No bony tenderness.   Assessment:   1. Onychomycosis of multiple toenails with type 2 diabetes mellitus and peripheral angiopathy (HCC)   2. Venous ulcer of right leg (HCC)   3. Encounter for diabetic foot exam [facility](HCC)    Plan:  Patient was  evaluated and treated and all questions answered.  Diabetes with PAD, Onychomycosis -Educated on diabetic footcare. Diabetic risk level 2 -Nails x10 debrided sharply and manually with large nail nipper and rotary burr.   Procedure: Nail Debridement Rationale: Patient meets criteria for routine foot care due to PAD Type of Debridement: manual, sharp debridement. Instrumentation: Nail nipper, rotary burr. Number of Nails: 10   No follow-ups on file.

## 2019-04-09 ENCOUNTER — Ambulatory Visit: Payer: Medicare HMO | Admitting: Sports Medicine

## 2019-06-01 NOTE — Progress Notes (Deleted)
HISTORY AND PHYSICAL     CC:  follow up. Requesting Provider:  Nonnie Done., MD  HPI: This is a 68 y.o. male who is here today for follow up.  He underwent right EIA, CFA, and profunda endarterectomy with vein patch angioplasty and right CFA to below knee popliteal bypass with 41mm ringed PTFE on 01/27/2019 by Dr. Randie Heinz.  The right GSV was harvested, but had areas of sclerosis and was not suitable for bypass.  Pt had brisk PT/AT doppler signals at discharge.  He was discharged home with [facility] on 02/02/2019.    The pt returns today for follow up and studies.   ***  The pt is on a statin for cholesterol management.    The pt is on an aspirin.    Other AC:  none The pt is on CCB, ACEI for hypertension.  The pt does have diabetes. Tobacco hx:  remote   Past Medical History:  Diagnosis Date  . Arthritis    "all over" (08/12/2018)  . Complication of anesthesia 07/2018   "out of it for 2 weeks."  "Low Blood Sugar."  . High cholesterol   . HOH (hard of hearing)   . Hypertension   . Type II diabetes mellitus (HCC)     Past Surgical History:  Procedure Laterality Date  . ABDOMINAL AORTOGRAM W/LOWER EXTREMITY N/A 01/22/2019   Procedure: ABDOMINAL AORTOGRAM W/LOWER EXTREMITY;  Surgeon: Maeola Harman, MD;  Location: [facility] INVASIVE CV LAB;  Service: Cardiovascular;  Laterality: N/A;  rt leg  . ANTERIOR CERVICAL DECOMP/DISCECTOMY FUSION  08/12/2018   CERVICAL 3-4, CERVICAL 4-5, CERVICAL 5-6, CERVICAL 6-7 WITH INSTRUMENTATION AND ALLOGRAFT  . ANTERIOR CERVICAL DECOMPRESSION/DISCECTOMY FUSION 4 LEVELS N/A 08/12/2018   Procedure: ANTERIOR CERVICAL DECOMPRESSION FUSION CERVICAL 3-4, CERVICAL 4-5, CERVICAL 5-6, CERVICAL 6-7 WITH INSTRUMENTATION AND ALLOGRAFT;  Surgeon: Estill Bamberg, MD;  Location: [facility] OR;  Service: Orthopedics;  Laterality: N/A;  ANTERIOR CERVICAL DECOMPRESSION FUSION CERVICAL 3-4, CERVICAL 4-5, CERVICAL 5-6, CERVICAL 6-7 WITH INSTRUMENTATION AND ALLOGRAFT  . COLONOSCOPY  W/ POLYPECTOMY    . FEMORAL-POPLITEAL BYPASS GRAFT Right 01/27/2019   Procedure: right FEMORAL ENDARTERECTOMY, RIGHT  FEMORAL- BELOW THE KNEE POPLITEAL ARTERY BYPASS GRAFT using 24mm propaten ringed gore graft, attempted right femoral to below knee popliteal artery bypass using right nonreversed great saphenous vein;  Surgeon: Maeola Harman, MD;  Location: [facility] OR;  Service: Vascular;  Laterality: Right;  . INGUINAL HERNIA REPAIR  1958   "? side"  . KNEE ARTHROSCOPY Right   . PILONIDAL CYST EXCISION      No Known Allergies  Current Outpatient Medications  Medication Sig Dispense Refill  . acetaminophen (TYLENOL) 325 MG tablet Take 2 tablets (650 mg total) by mouth every 4 (four) hours as needed for mild pain ((score 1 to 3) or temp > 100.5).    Marland Kitchen amLODipine (NORVASC) 10 MG tablet Take 10 mg by mouth daily.    Marland Kitchen aspirin EC 81 MG tablet Take 81 mg by mouth daily.    Marland Kitchen atorvastatin (LIPITOR) 20 MG tablet Take 20 mg by mouth 4 (four) times a week. Evern Bio, Thur, and Sat  1  . baclofen (LIORESAL) 10 MG tablet Take 1 tablet (10 mg total) by mouth 3 (three) times daily as needed for muscle spasms. 30 each 0  . cholecalciferol (VITAMIN D) 1000 units tablet Take 1 tablet (1,000 Units total) by mouth daily.    . cloNIDine (CATAPRES) 0.1 MG tablet Take 0.1 mg by mouth 2 (two)  times daily.    . collagenase (SANTYL) ointment Apply 1 application topically daily. To right ankle 30 g 2  . feeding supplement, ENSURE ENLIVE, (ENSURE ENLIVE) LIQD Take 237 mLs by mouth 2 (two) times daily between meals. 237 mL 12  . glimepiride (AMARYL) 4 MG tablet Take 1 tablet (4 mg total) by mouth every evening.    . hydrochlorothiazide (HYDRODIURIL) 25 MG tablet Take 25 mg by mouth daily.    Marland Kitchen lisinopril (ZESTRIL) 20 MG tablet Take 20 mg by mouth daily.    . Multiple Vitamin (MULTIVITAMIN WITH MINERALS) TABS tablet Take 1 tablet by mouth daily.    . pantoprazole (PROTONIX) 40 MG tablet Take 1 tablet (40 mg total)  by mouth daily with supper.    . pioglitazone (ACTOS) 15 MG tablet Take 15 mg by mouth every morning.     . traMADol (ULTRAM) 50 MG tablet Take 1 tablet (50 mg total) by mouth every 6 (six) hours as needed for severe pain. 30 tablet 0   No current facility-administered medications for this visit.     Family History  Problem Relation Age of Onset  . Cancer Mother   . Stroke Father        multiple    Social History   Socioeconomic History  . Marital status: Married    Spouse name: Not on file  . Number of children: Not on file  . Years of education: Not on file  . Highest education level: Not on file  Occupational History  . Not on file  Social Needs  . Financial resource strain: Not on file  . Food insecurity    Worry: Not on file    Inability: Not on file  . Transportation needs    Medical: Not on file    Non-medical: Not on file  Tobacco Use  . Smoking status: Former Smoker    Packs/day: 1.00    Years: 30.00    Pack years: 30.00    Types: Cigarettes  . Smokeless tobacco: Never Used  . Tobacco comment: quit in late 1990's  Substance and Sexual Activity  . Alcohol use: Not Currently    Comment: 08/12/2018 "nothing in the 2000s"  . Drug use: Never  . Sexual activity: Not Currently  Lifestyle  . Physical activity    Days per week: Not on file    Minutes per session: Not on file  . Stress: Not on file  Relationships  . Social Herbalist on phone: Not on file    Gets together: Not on file    Attends religious service: Not on file    Active member of club or organization: Not on file    Attends meetings of clubs or organizations: Not on file    Relationship status: Not on file  . Intimate partner violence    Fear of current or ex partner: Not on file    Emotionally abused: Not on file    Physically abused: Not on file    Forced sexual activity: Not on file  Other Topics Concern  . Not on file  Social History Narrative  . Not on file     REVIEW  OF SYSTEMS:  *** [X]  denotes positive finding, [ ]  denotes negative finding Cardiac  Comments:  Chest pain or chest pressure:    Shortness of breath upon exertion:    Short of breath when lying flat:    Irregular heart rhythm:  Vascular    Pain in calf, thigh, or hip brought on by ambulation:    Pain in feet at night that wakes you up from your sleep:     Blood clot in your veins:    Leg swelling:         Pulmonary    Oxygen at home:    Productive cough:     Wheezing:         Neurologic    Sudden weakness in arms or legs:     Sudden numbness in arms or legs:     Sudden onset of difficulty speaking or slurred speech:    Temporary loss of vision in one eye:     Problems with dizziness:         Gastrointestinal    Blood in stool:     Vomited blood:         Genitourinary    Burning when urinating:     Blood in urine:        Psychiatric    Major depression:         Hematologic    Bleeding problems:    Problems with blood clotting too easily:        Skin    Rashes or ulcers:        Constitutional    Fever or chills:      PHYSICAL EXAMINATION:  ***  General:  WDWN in NAD; vital signs documented above Gait: Not observed HENT: WNL, normocephalic Pulmonary: normal non-labored breathing , without Rales, rhonchi,  wheezing Cardiac: {Desc; regular/irreg:14544} HR, without  Murmurs; {With/Without:20273} carotid bruit*** Abdomen: soft, NT, no masses Skin: {With/Without:20273} rashes Vascular Exam/Pulses:  Right Left  Radial {Exam; arterial pulse strength 0-4:30167} {Exam; arterial pulse strength 0-4:30167}  Ulnar {Exam; arterial pulse strength 0-4:30167} {Exam; arterial pulse strength 0-4:30167}  Femoral {Exam; arterial pulse strength 0-4:30167} {Exam; arterial pulse strength 0-4:30167}  Popliteal {Exam; arterial pulse strength 0-4:30167} {Exam; arterial pulse strength 0-4:30167}  DP {Exam; arterial pulse strength 0-4:30167} {Exam; arterial pulse strength  0-4:30167}  PT {Exam; arterial pulse strength 0-4:30167} {Exam; arterial pulse strength 0-4:30167}   Extremities: {With/Without:20273} ischemic changes, {With/Without:20273} Gangrene , {With/Without:20273} cellulitis; {With/Without:20273} open wounds;  Musculoskeletal: no muscle wasting or atrophy  Neurologic: A&O X 3;  No focal weakness or paresthesias are detected Psychiatric:  The pt has {Desc; normal/abnormal:11317::"Normal"} affect.   Non-Invasive Vascular Imaging:   ABI's/TBI's on ***: Right:  *** Left:  ***  *** Arterial duplex on ***:  Previous ABI's/TBI's on 01/29/2019 (post op): Right:  1.06 Left:  0.58   ASSESSMENT/PLAN:: 68 y.o. male who is s/p right EIA, CFA, and profunda endarterectomy with vein patch angioplasty and right CFA to below knee popliteal bypass with 6mm ringed PTFE on 01/27/2019 by Dr. Randie Heinzain.  -***   Doreatha MassedSamantha Milanya Sunderland, [name] [facility] [phone]  Clinic MD:   Randie Heinzain

## 2019-06-02 ENCOUNTER — Ambulatory Visit (INDEPENDENT_AMBULATORY_CARE_PROVIDER_SITE_OTHER): Payer: Medicare HMO | Admitting: Podiatry

## 2019-06-02 ENCOUNTER — Other Ambulatory Visit: Payer: Self-pay

## 2019-06-02 DIAGNOSIS — E1169 Type 2 diabetes mellitus with other specified complication: Secondary | ICD-10-CM

## 2019-06-02 DIAGNOSIS — E1151 Type 2 diabetes mellitus with diabetic peripheral angiopathy without gangrene: Secondary | ICD-10-CM | POA: Diagnosis not present

## 2019-06-02 DIAGNOSIS — B351 Tinea unguium: Secondary | ICD-10-CM

## 2019-06-04 ENCOUNTER — Other Ambulatory Visit: Payer: Self-pay

## 2019-06-04 DIAGNOSIS — I739 Peripheral vascular disease, unspecified: Secondary | ICD-10-CM

## 2019-06-05 ENCOUNTER — Ambulatory Visit: Payer: Medicare HMO

## 2019-06-05 ENCOUNTER — Ambulatory Visit (HOSPITAL_COMMUNITY): Payer: Medicare HMO

## 2019-06-08 NOTE — Progress Notes (Signed)
Subjective:  Patient ID: Brandon Collins, male    DOB: 01-17-1951,  MRN: [id number]  Chief Complaint  Patient presents with  . Nail Problem    pt is here for diabetic nail care, pt is a diabetic type 2    68 y.o. male presents  for diabetic foot care. Last AMBS was 145. Last A1c 7. Reports numbness and tingling in their feet. Reports cramping in legs and thighs.  Review of Systems: Negative except as noted in the HPI. Denies N/V/F/Ch.  Past Medical History:  Diagnosis Date  . Arthritis    "all over" (08/12/2018)  . Complication of anesthesia 07/2018   "out of it for 2 weeks."  "Low Blood Sugar."  . High cholesterol   . HOH (hard of hearing)   . Hypertension   . Type II diabetes mellitus (HCC)     Current Outpatient Medications:  .  acetaminophen (TYLENOL) 325 MG tablet, Take 2 tablets (650 mg total) by mouth every 4 (four) hours as needed for mild pain ((score 1 to 3) or temp > 100.5)., Disp: , Rfl:  .  amLODipine (NORVASC) 10 MG tablet, Take 10 mg by mouth daily., Disp: , Rfl:  .  aspirin EC 81 MG tablet, Take 81 mg by mouth daily., Disp: , Rfl:  .  atorvastatin (LIPITOR) 20 MG tablet, Take 20 mg by mouth 4 (four) times a week. [name], Thur, and Sat, Disp: , Rfl: 1 .  baclofen (LIORESAL) 10 MG tablet, Take 1 tablet (10 mg total) by mouth 3 (three) times daily as needed for muscle spasms., Disp: 30 each, Rfl: 0 .  cholecalciferol (VITAMIN D) 1000 units tablet, Take 1 tablet (1,000 Units total) by mouth daily., Disp: , Rfl:  .  cloNIDine (CATAPRES) 0.1 MG tablet, Take 0.1 mg by mouth 2 (two) times daily., Disp: , Rfl:  .  collagenase (SANTYL) ointment, Apply 1 application topically daily. To right ankle, Disp: 30 g, Rfl: 2 .  feeding supplement, ENSURE ENLIVE, (ENSURE ENLIVE) LIQD, Take 237 mLs by mouth 2 (two) times daily between meals., Disp: 237 mL, Rfl: 12 .  glimepiride (AMARYL) 4 MG tablet, Take 1 tablet (4 mg total) by mouth every evening., Disp: , Rfl:  .   hydrochlorothiazide (HYDRODIURIL) 25 MG tablet, Take 25 mg by mouth daily., Disp: , Rfl:  .  lisinopril (ZESTRIL) 20 MG tablet, Take 20 mg by mouth daily., Disp: , Rfl:  .  lisinopril (ZESTRIL) 40 MG tablet, Take 40 mg by mouth daily., Disp: , Rfl:  .  Multiple Vitamin (MULTIVITAMIN WITH MINERALS) TABS tablet, Take 1 tablet by mouth daily., Disp: , Rfl:  .  ONETOUCH ULTRA test strip, USE TO TEST FASTING SUGAR ONCE DAILY IN THE MORNING, Disp: , Rfl:  .  pantoprazole (PROTONIX) 40 MG tablet, Take 1 tablet (40 mg total) by mouth daily with supper., Disp: , Rfl:  .  pioglitazone (ACTOS) 15 MG tablet, Take 15 mg by mouth every morning. , Disp: , Rfl:  .  sulfamethoxazole-trimethoprim (BACTRIM DS) 800-160 MG tablet, Take 1 tablet by mouth 2 (two) times daily., Disp: , Rfl:  .  traMADol (ULTRAM) 50 MG tablet, Take 1 tablet (50 mg total) by mouth every 6 (six) hours as needed for severe pain., Disp: 30 tablet, Rfl: 0  Social History   Tobacco Use  Smoking Status Former Smoker  . Packs/day: 1.00  . Years: 30.00  . Pack years: 30.00  . Types: Cigarettes  Smokeless Tobacco Never Used  Tobacco Comment   quit in late 1990's    No Known Allergies Objective:   There were no vitals filed for this visit. There is no height or weight on file to calculate BMI. Constitutional Well developed. Well nourished.  Vascular Dorsalis pedis pulses present 1+ bilaterally  Posterior tibial pulses absent bilaterally  Pedal hair growth absent. Capillary refill normal to all digits.  No cyanosis or clubbing noted.  Neurologic Normal speech. Oriented to person, place, and time. Epicritic sensation to light touch grossly present bilaterally. Protective sensation with 5.07 monofilament  present bilaterally. Vibratory sensation present bilaterally.  Dermatologic Nails elongated, thickened, dystrophic. R leg with dressed wound  Orthopedic: Normal joint ROM without pain or crepitus bilaterally. No visible  deformities. No bony tenderness.   Assessment:   1. Onychomycosis of multiple toenails with type 2 diabetes mellitus and peripheral angiopathy (HCC)    Plan:  Patient was evaluated and treated and all questions answered.  Diabetes with PAD, Onychomycosis -At risk foot care as below  Procedure: Nail Debridement Rationale: Patient meets criteria for routine foot care due to PAD Type of Debridement: manual, sharp debridement. Instrumentation: Nail nipper, rotary burr. Number of Nails: 10     No follow-ups on file.

## 2019-06-12 ENCOUNTER — Other Ambulatory Visit: Payer: Self-pay

## 2019-06-12 ENCOUNTER — Ambulatory Visit (INDEPENDENT_AMBULATORY_CARE_PROVIDER_SITE_OTHER)
Admission: RE | Admit: 2019-06-12 | Discharge: 2019-06-12 | Disposition: A | Discharge status: Home / Self Care | Payer: Medicare HMO | Source: Ambulatory Visit | Attending: Vascular Surgery | Admitting: Vascular Surgery

## 2019-06-12 ENCOUNTER — Ambulatory Visit (HOSPITAL_COMMUNITY)
Admission: RE | Admit: 2019-06-12 | Discharge: 2019-06-12 | Disposition: A | Discharge status: Home / Self Care | Payer: Medicare HMO | Source: Ambulatory Visit | Attending: Vascular Surgery | Admitting: Vascular Surgery

## 2019-06-12 ENCOUNTER — Ambulatory Visit: Payer: Medicare HMO | Admitting: Physician Assistant

## 2019-06-12 VITALS — BP 125/67 | HR 65 | Temp 97.9°F | Resp 20 | Ht 72.0 in | Wt 227.0 lb

## 2019-06-12 DIAGNOSIS — I739 Peripheral vascular disease, unspecified: Secondary | ICD-10-CM

## 2019-06-12 DIAGNOSIS — L97311 Non-pressure chronic ulcer of right ankle limited to breakdown of skin: Secondary | ICD-10-CM | POA: Diagnosis not present

## 2019-06-12 NOTE — Progress Notes (Addendum)
Established Previous Bypass   History of Present Illness   Brandon Collins is a 68 y.o. ([birth year]) male who presents to go over vascular studies.  He is status post right femoral endarterectomy and femoral to below the knee popliteal bypass with PTFE in May of this year by Dr. Randie Heinz.  Patient is seen in a wheelchair today and states he is receiving home health physical therapy and is now able to walk with a walker back-and-forth on his front porch.  He still has edema of right lower extremity however states this is improving.  He had been in wound care involving hyperbaric oxygen therapy however states that his lateral malleolus diabetic ulceration is now healed.  He denies any rest pain in his right foot.  He is taking a aspirin and statin daily.  The patient's PMH, PSH, SH, and FamHx were reviewed and are unchanged from prior visit.  Current Outpatient Medications  Medication Sig Dispense Refill  . acetaminophen (TYLENOL) 325 MG tablet Take 2 tablets (650 mg total) by mouth every 4 (four) hours as needed for mild pain ((score 1 to 3) or temp > 100.5).    Marland Kitchen amLODipine (NORVASC) 10 MG tablet Take 10 mg by mouth daily.    Marland Kitchen aspirin EC 81 MG tablet Take 81 mg by mouth daily.    Marland Kitchen atorvastatin (LIPITOR) 20 MG tablet Take 20 mg by mouth 4 (four) times a week. Evern Bio, Thur, and Sat  1  . baclofen (LIORESAL) 10 MG tablet Take 1 tablet (10 mg total) by mouth 3 (three) times daily as needed for muscle spasms. 30 each 0  . cholecalciferol (VITAMIN D) 1000 units tablet Take 1 tablet (1,000 Units total) by mouth daily.    . cloNIDine (CATAPRES) 0.1 MG tablet Take 0.1 mg by mouth 2 (two) times daily.    . collagenase (SANTYL) ointment Apply 1 application topically daily. To right ankle 30 g 2  . feeding supplement, ENSURE ENLIVE, (ENSURE ENLIVE) LIQD Take 237 mLs by mouth 2 (two) times daily between meals. 237 mL 12  . glimepiride (AMARYL) 4 MG tablet Take 1 tablet (4 mg total) by mouth every evening.     . hydrochlorothiazide (HYDRODIURIL) 25 MG tablet Take 25 mg by mouth daily.    Marland Kitchen lisinopril (ZESTRIL) 20 MG tablet Take 20 mg by mouth daily.    Marland Kitchen lisinopril (ZESTRIL) 40 MG tablet Take 40 mg by mouth daily.    . Multiple Vitamin (MULTIVITAMIN WITH MINERALS) TABS tablet Take 1 tablet by mouth daily.    Letta Pate ULTRA test strip USE TO TEST FASTING SUGAR ONCE DAILY IN THE MORNING    . pantoprazole (PROTONIX) 40 MG tablet Take 1 tablet (40 mg total) by mouth daily with supper.    . pioglitazone (ACTOS) 15 MG tablet Take 15 mg by mouth every morning.     . sulfamethoxazole-trimethoprim (BACTRIM DS) 800-160 MG tablet Take 1 tablet by mouth 2 (two) times daily.    . traMADol (ULTRAM) 50 MG tablet Take 1 tablet (50 mg total) by mouth every 6 (six) hours as needed for severe pain. 30 tablet 0   No current facility-administered medications for this visit.     On ROS today: 10 system ROS is negative unless otherwise noted in HPI   Physical Examination   Vitals:   06/12/19 1305  BP: 125/67  Pulse: 65  Resp: 20  Temp: 97.9 F (36.6 C)  SpO2: 100%  Weight: 227 lb (103  kg)  Height: 6' (1.829 m)   Body mass index is 30.79 kg/m.  General Alert, O x 3, WD, NAD  Pulmonary Sym exp, good B air movt  Cardiac RRR, Nl S1, S2  Vascular Vessel Right Left  Radial Palpable Palpable  Popliteal Not palpable Not palpable  PT Not palpable Not palpable  DP Not palpable Not palpable    Gastro- intestinal soft, non-distended, non-tender to palpation,   Musculo- skeletal  right AT and PT by Doppler; M/S 5/5 throughout  , Extremities without ischemic changes  , Pitting edema present: to the level of the knee RLE, No visible varicosities , No Lipodermatosclerosis present  Neurologic Pain and light touch intact in extremities , Motor exam as listed above    Non-Invasive Vascular Imaging ABI (06/12/19)  R:   ABI: 0.99,   PT: bi  DP: bi  TBI:  0.68  L:   ABI: 0.76,   PT: mono  DP:  mono  TBI: 0.59   Bypass Duplex (06/12/19)  Proximal to bypass: 198 c/s  With-in bypass: 496 c/s proximal graft  Distal to bypass: 194 c/s   Medical Decision Making   Brandon Collins is a 67 y.o. male who presents with: graft surveillance 3 months status post right common femoral endarterectomy and femoral to below the knee popliteal bypass with PTFE   Patient denies any rest pain and also states right lateral malleolus ulceration is now healed  Bypass duplex demonstrates a greater than 70% stenosis in proximal graft however beyond this lesion flow is unaffected and patient has biphasic tibial runoff  I will schedule patient for repeat graft surveillance and ABIs in 6 months per protocol for now  However I will also discuss bypass duplex with Dr. [name] to see if proximal graft stenosis warrants angiography with intervention  ADDENDUM: This case was discussed with Dr. [name] and plan will be for patient to return for graft surveillance imaging in 3 months.  Dagoberto Ligas [name] [facility] [place] Office: [phone]  Clinic MD: Dr. Donnetta Hutching

## 2019-08-18 DIAGNOSIS — M24561 Contracture, right knee: Secondary | ICD-10-CM | POA: Insufficient documentation

## 2019-09-01 ENCOUNTER — Other Ambulatory Visit: Payer: Self-pay

## 2019-09-01 ENCOUNTER — Ambulatory Visit (INDEPENDENT_AMBULATORY_CARE_PROVIDER_SITE_OTHER): Payer: Medicare HMO | Admitting: Podiatry

## 2019-09-01 DIAGNOSIS — E1151 Type 2 diabetes mellitus with diabetic peripheral angiopathy without gangrene: Secondary | ICD-10-CM

## 2019-09-01 DIAGNOSIS — E1169 Type 2 diabetes mellitus with other specified complication: Secondary | ICD-10-CM | POA: Diagnosis not present

## 2019-09-01 DIAGNOSIS — B351 Tinea unguium: Secondary | ICD-10-CM

## 2019-09-01 NOTE — Progress Notes (Signed)
Subjective:  Patient ID: Brandon Collins, male    DOB: 02-Feb-1951,  MRN: [id number]  Chief Complaint  Patient presents with  . debride    diabetic nail trimming  . Diabetes    FBS: 130 A1C: na PCP : SHultz x 1 day -pt states he's doing PT and is now using a walker instead of a wheel chair Pt states he feels better w/ standing and walking    68 y.o. male presents  for diabetic foot care. Last AMBS was 130. Last A1c unknown. Reports numbness and tingling in their feet. Reports cramping in legs and thighs.  Review of Systems: Negative except as noted in the HPI. Denies N/V/F/Ch.  Past Medical History:  Diagnosis Date  . Arthritis    "all over" (08/12/2018)  . Complication of anesthesia 07/2018   "out of it for 2 weeks."  "Low Blood Sugar."  . High cholesterol   . HOH (hard of hearing)   . Hypertension   . Type II diabetes mellitus (HCC)     Current Outpatient Medications:  .  amLODipine (NORVASC) 10 MG tablet, Take 10 mg by mouth daily., Disp: , Rfl:  .  aspirin EC 81 MG tablet, Take 81 mg by mouth daily., Disp: , Rfl:  .  atorvastatin (LIPITOR) 20 MG tablet, Take 20 mg by mouth 4 (four) times a week. Evern Bio, Thur, and Sat, Disp: , Rfl: 1 .  cholecalciferol (VITAMIN D) 1000 units tablet, Take 1 tablet (1,000 Units total) by mouth daily., Disp: , Rfl:  .  feeding supplement, ENSURE ENLIVE, (ENSURE ENLIVE) LIQD, Take 237 mLs by mouth 2 (two) times daily between meals., Disp: 237 mL, Rfl: 12 .  glimepiride (AMARYL) 4 MG tablet, Take 1 tablet (4 mg total) by mouth every evening., Disp: , Rfl:  .  hydrochlorothiazide (HYDRODIURIL) 25 MG tablet, Take 25 mg by mouth daily., Disp: , Rfl:  .  lisinopril (ZESTRIL) 20 MG tablet, Take 20 mg by mouth daily., Disp: , Rfl:  .  ONETOUCH ULTRA test strip, USE TO TEST FASTING SUGAR ONCE DAILY IN THE MORNING, Disp: , Rfl:  .  pantoprazole (PROTONIX) 40 MG tablet, Take 1 tablet (40 mg total) by mouth daily with supper., Disp: , Rfl:   Social  History   Tobacco Use  Smoking Status Former Smoker  . Packs/day: 1.00  . Years: 30.00  . Pack years: 30.00  . Types: Cigarettes  Smokeless Tobacco Never Used  Tobacco Comment   quit in late 1990's    No Known Allergies Objective:   There were no vitals filed for this visit. There is no height or weight on file to calculate BMI. Constitutional Well developed. Well nourished.  Vascular Dorsalis pedis pulses present 1+ bilaterally  Posterior tibial pulses absent bilaterally  Pedal hair growth absent. Capillary refill normal to all digits.  No cyanosis or clubbing noted.  Neurologic Normal speech. Oriented to person, place, and time. Epicritic sensation to light touch grossly present bilaterally. Protective sensation with 5.07 monofilament  present bilaterally. Vibratory sensation present bilaterally.  Dermatologic Nails elongated, thickened, dystrophic. R leg with dressed wound  Orthopedic: Normal joint ROM without pain or crepitus bilaterally. No visible deformities. No bony tenderness.   Assessment:   1. Onychomycosis of multiple toenails with type 2 diabetes mellitus and peripheral angiopathy (HCC)    Plan:  Patient was evaluated and treated and all questions answered.  Diabetes with PAD, Onychomycosis -At risk foot care as below  Procedure: Nail Debridement Rationale:  Patient meets criteria for routine foot care due to PAD Type of Debridement: manual, sharp debridement. Instrumentation: Nail nipper, rotary burr. Number of Nails: 10     Return in about 3 months (around 11/30/2019) for Diabetic Foot Care.

## 2019-09-23 ENCOUNTER — Ambulatory Visit: Payer: Medicare HMO

## 2019-09-23 ENCOUNTER — Encounter (HOSPITAL_COMMUNITY): Payer: Medicare HMO

## 2019-10-01 ENCOUNTER — Ambulatory Visit: Payer: Medicare HMO

## 2019-10-01 ENCOUNTER — Encounter (HOSPITAL_COMMUNITY): Payer: Medicare HMO

## 2019-10-22 ENCOUNTER — Other Ambulatory Visit: Payer: Self-pay

## 2019-10-22 DIAGNOSIS — I739 Peripheral vascular disease, unspecified: Secondary | ICD-10-CM

## 2019-10-23 ENCOUNTER — Ambulatory Visit (INDEPENDENT_AMBULATORY_CARE_PROVIDER_SITE_OTHER): Payer: Medicare HMO | Admitting: Physician Assistant

## 2019-10-23 ENCOUNTER — Ambulatory Visit (INDEPENDENT_AMBULATORY_CARE_PROVIDER_SITE_OTHER)
Admission: RE | Admit: 2019-10-23 | Discharge: 2019-10-23 | Disposition: A | Discharge status: Home / Self Care | Payer: Medicare HMO | Source: Ambulatory Visit | Attending: Vascular Surgery | Admitting: Vascular Surgery

## 2019-10-23 ENCOUNTER — Ambulatory Visit (HOSPITAL_COMMUNITY)
Admission: RE | Admit: 2019-10-23 | Discharge: 2019-10-23 | Disposition: A | Discharge status: Home / Self Care | Payer: Medicare HMO | Source: Ambulatory Visit | Attending: Vascular Surgery | Admitting: Vascular Surgery

## 2019-10-23 ENCOUNTER — Other Ambulatory Visit: Payer: Self-pay

## 2019-10-23 VITALS — BP 133/75 | HR 64 | Temp 97.5°F | Resp 16 | Ht 72.0 in | Wt 244.6 lb

## 2019-10-23 DIAGNOSIS — I739 Peripheral vascular disease, unspecified: Secondary | ICD-10-CM

## 2019-10-23 NOTE — Progress Notes (Signed)
POST OPERATIVE OFFICE NOTE    CC:  F/u for surgery  HPI:  This is a 69 y.o. male who is s/p right femoral endarterectomy with right femoral to below-knee popliteal artery bypass with Gore graft in May 2020 by Dr. Donzetta Collins.  History of chronic right lateral malleolar ulcer, chronic dependent edema.  He currently denies lower extremity cramping with walking although he is able to go short distances.  He denies rest pain.  He is compliant with his statin and aspirin.  No Known Allergies  Current Outpatient Medications  Medication Sig Dispense Refill  . amLODipine (NORVASC) 10 MG tablet Take 10 mg by mouth daily.    Marland Kitchen aspirin EC 81 MG tablet Take 81 mg by mouth daily.    Marland Kitchen atorvastatin (LIPITOR) 20 MG tablet Take 20 mg by mouth 4 (four) times a week. Brandon Collins, Thur, and Sat  1  . cholecalciferol (VITAMIN D) 1000 units tablet Take 1 tablet (1,000 Units total) by mouth daily.    . feeding supplement, ENSURE ENLIVE, (ENSURE ENLIVE) LIQD Take 237 mLs by mouth 2 (two) times daily between meals. 237 mL 12  . glimepiride (AMARYL) 4 MG tablet Take 1 tablet (4 mg total) by mouth every evening.    . hydrochlorothiazide (HYDRODIURIL) 25 MG tablet Take 25 mg by mouth daily.    Marland Kitchen lisinopril (ZESTRIL) 20 MG tablet Take 20 mg by mouth daily.    Brandon Collins ULTRA test strip USE TO TEST FASTING SUGAR ONCE DAILY IN THE MORNING    . pantoprazole (PROTONIX) 40 MG tablet Take 1 tablet (40 mg total) by mouth daily with supper. (Patient not taking: Reported on 10/23/2019)     No current facility-administered medications for this visit.   Septmember 28, 2020 Right: Patent femoropopliteal bypass graft with velocities suggestive of a >70% stenosis involving the proximal graft Right ABI=.99 Left ABI=.76   ROS:  See HPI  Physical Exam:  Vitals:   10/23/19 1401  BP: 133/75  Pulse: 64  Resp: 16  Temp: (!) 97.5 F (36.4 C)  TempSrc: Temporal  SpO2: 100%  Weight: 244 lb 9.6 oz (110.9 kg)  Height: 6' (1.829 m)      Incision: Groin , right thigh and below knee calf incisions are all well-healed.   Extremities: Approximately 2 x 2 centimeter full-thickness ulcer of the right lateral malleolus.  Monophasic DP and posterior tibial Doppler pulses.  Very faint peroneal signal.  Similarly monophasic signals on the left.  Plus right femoral artery pulse.  There are no skin changes to the left foot.  2+ pitting edema Neuro: Oriented x4   Summary:  Right: 75-99% stenosis noted in the common femoral artery.   Previously noted proximal graft stenosis not visualized on this exam  likely due to limitations as listed above.   ABI Findings:  +---------+------------------+-----+---------+--------+  Right  Rt Pressure (mmHg)IndexWaveform Comment   +---------+------------------+-----+---------+--------+  Brachial 158           triphasic      +---------+------------------+-----+---------+--------+  PTA   127        0.80 biphasic       +---------+------------------+-----+---------+--------+  DP    109        0.69 biphasic       +---------+------------------+-----+---------+--------+  Great Toe81        0.51 Abnormal       +---------+------------------+-----+---------+--------+   +---------+------------------+-----+---------+-------+  Left   Lt Pressure (mmHg)IndexWaveform Comment  +---------+------------------+-----+---------+-------+  Brachial 158  triphasic      +---------+------------------+-----+---------+-------+  PTA   108        0.68 biphasic       +---------+------------------+-----+---------+-------+  DP    90        0.57 biphasic       +---------+------------------+-----+---------+-------+  Great Toe65        0.41 Abnormal       +---------+------------------+-----+---------+-------+    +-------+-----------+-----------+------------+------------+  ABI/TBIToday's ABIToday's TBIPrevious ABIPrevious TBI  +-------+-----------+-----------+------------+------------+  Right 0.80    0.51    0.99    0.76      +-------+-----------+-----------+------------+------------+  Left  0.68    0.41    0.76    0.59      +-------+-----------+-----------+------------+------------+     Bilateral ABIs appear decreased compared to prior study on 06/12/2019.  Right TBIs appear decreased compared to prior study on 06/12/2019. Left  TBIs appear essentially unchanged compared to prior study on 06/12/2019    Summary:  Right: Resting right ankle-brachial index indicates mild right lower  extremity arterial disease. The right toe-brachial index is abnormal. RT  great toe pressure = 81 mmHg.   Left: Resting left ankle-brachial index indicates moderate left lower  extremity arterial disease. The left toe-brachial index is abnormal. LT  Great toe pressure = 65 mmHg.      *See table(s) above for measurements and observations.    Right Graft #1:  +------------------+--------+---------------+----------+--------+           PSV cm/sStenosis    Waveform Comments  +------------------+--------+---------------+----------+--------+  Inflow      519   75-99% stenosismonophasicWith PST  +------------------+--------+---------------+----------+--------+  Prox Anastomosis 222           monophasicPST     +------------------+--------+---------------+----------+--------+  Proximal Graft  65           monophasic      +------------------+--------+---------------+----------+--------+  Mid Graft     57           monophasic      +------------------+--------+---------------+----------+--------+  Distal Graft   57           monophasic       +------------------+--------+---------------+----------+--------+  Distal Anastomosis60           monophasic      +------------------+--------+---------------+----------+--------+  Outflow      56           monophasic      +------------------+--------+---------------+----------+--------+   Previously noted proximal graft stenosis not visualized on this exam  likely due to limitations as listed above.      Summary:  Right: 75-99% stenosis noted in the common femoral artery.   Previously noted proximal graft stenosis not visualized on this exam  likely due to limitations as listed above.        Assessment/Plan:  This is a 69 y.o. male who is s/p: Right common femoral endarterectomy with femoral to above-knee popliteal artery bypass with Gore graft.  Noninvasive studies today reveal stenosis at the origin of his graft.  He is at risk for graft thrombosis.  Arrangements made for arteriogram with Dr. Randie Heinz on Monday.   -Milinda Antis, [name] [facility] [phone]  Clinic MD: Randie Heinz

## 2019-10-24 ENCOUNTER — Other Ambulatory Visit (HOSPITAL_COMMUNITY): Payer: Medicare HMO

## 2019-10-24 ENCOUNTER — Other Ambulatory Visit (HOSPITAL_COMMUNITY)
Admission: RE | Admit: 2019-10-24 | Discharge: 2019-10-24 | Disposition: A | Discharge status: Home / Self Care | Payer: Medicare HMO | Source: Ambulatory Visit | Attending: Vascular Surgery | Admitting: Vascular Surgery

## 2019-10-24 DIAGNOSIS — Z01812 Encounter for preprocedural laboratory examination: Secondary | ICD-10-CM | POA: Diagnosis present

## 2019-10-24 DIAGNOSIS — Z20822 Contact with and (suspected) exposure to covid-19: Secondary | ICD-10-CM | POA: Diagnosis not present

## 2019-10-24 LAB — SARS CORONAVIRUS 2 (TAT 6-24 HRS): SARS Coronavirus 2: NEGATIVE

## 2019-10-26 ENCOUNTER — Ambulatory Visit (HOSPITAL_COMMUNITY)
Admission: RE | Admit: 2019-10-26 | Discharge: 2019-10-26 | Disposition: A | Discharge status: Home / Self Care | Payer: Medicare HMO | Source: Ambulatory Visit | Attending: Vascular Surgery | Admitting: Vascular Surgery

## 2019-10-26 ENCOUNTER — Encounter (HOSPITAL_COMMUNITY)
Admission: RE | Disposition: A | Discharge status: Home / Self Care | Payer: Self-pay | Source: Ambulatory Visit | Attending: Vascular Surgery

## 2019-10-26 ENCOUNTER — Other Ambulatory Visit: Payer: Self-pay

## 2019-10-26 DIAGNOSIS — I70201 Unspecified atherosclerosis of native arteries of extremities, right leg: Secondary | ICD-10-CM

## 2019-10-26 DIAGNOSIS — Z79899 Other long term (current) drug therapy: Secondary | ICD-10-CM | POA: Diagnosis not present

## 2019-10-26 DIAGNOSIS — Z7984 Long term (current) use of oral hypoglycemic drugs: Secondary | ICD-10-CM | POA: Diagnosis not present

## 2019-10-26 DIAGNOSIS — T82858A Stenosis of vascular prosthetic devices, implants and grafts, initial encounter: Secondary | ICD-10-CM | POA: Insufficient documentation

## 2019-10-26 DIAGNOSIS — Y839 Surgical procedure, unspecified as the cause of abnormal reaction of the patient, or of later complication, without mention of misadventure at the time of the procedure: Secondary | ICD-10-CM | POA: Insufficient documentation

## 2019-10-26 DIAGNOSIS — I771 Stricture of artery: Secondary | ICD-10-CM | POA: Diagnosis not present

## 2019-10-26 DIAGNOSIS — Z7982 Long term (current) use of aspirin: Secondary | ICD-10-CM | POA: Insufficient documentation

## 2019-10-26 DIAGNOSIS — Z87891 Personal history of nicotine dependence: Secondary | ICD-10-CM

## 2019-10-26 HISTORY — PX: PERIPHERAL VASCULAR ATHERECTOMY: CATH118256

## 2019-10-26 HISTORY — PX: ABDOMINAL AORTOGRAM W/LOWER EXTREMITY: CATH118223

## 2019-10-26 LAB — POCT I-STAT, CHEM 8
BUN: 26 mg/dL — ABNORMAL HIGH (ref 8–23)
Calcium, Ion: 1.25 mmol/L (ref 1.15–1.40)
Chloride: 104 mmol/L (ref 98–111)
Creatinine, Ser: 0.9 mg/dL (ref 0.61–1.24)
Glucose, Bld: 100 mg/dL — ABNORMAL HIGH (ref 70–99)
HCT: 39 % (ref 39.0–52.0)
Hemoglobin: 13.3 g/dL (ref 13.0–17.0)
Potassium: 4 mmol/L (ref 3.5–5.1)
Sodium: 143 mmol/L (ref 135–145)
TCO2: 27 mmol/L (ref 22–32)

## 2019-10-26 SURGERY — ABDOMINAL AORTOGRAM W/LOWER EXTREMITY
Anesthesia: LOCAL

## 2019-10-26 MED ORDER — HEPARIN SODIUM (PORCINE) 1000 UNIT/ML IJ SOLN
INTRAMUSCULAR | Status: DC | PRN
  Administered 2019-10-26: 10000 [IU] via INTRAVENOUS

## 2019-10-26 MED ORDER — HEPARIN SODIUM (PORCINE) 1000 UNIT/ML IJ SOLN
INTRAMUSCULAR | Status: AC
  Filled 2019-10-26: qty 1

## 2019-10-26 MED ORDER — SODIUM CHLORIDE 0.9 % WEIGHT BASED INFUSION: 1.0000 mL/kg/h | INTRAVENOUS | Status: DC

## 2019-10-26 MED ORDER — ACETAMINOPHEN 325 MG PO TABS: 650.0000 mg | ORAL_TABLET | ORAL | Status: DC | PRN

## 2019-10-26 MED ORDER — HYDRALAZINE HCL 20 MG/ML IJ SOLN: 5.0000 mg | INTRAMUSCULAR | Status: DC | PRN

## 2019-10-26 MED ORDER — LABETALOL HCL 5 MG/ML IV SOLN: 10.0000 mg | INTRAVENOUS | Status: DC | PRN

## 2019-10-26 MED ORDER — CLOPIDOGREL BISULFATE 300 MG PO TABS
ORAL_TABLET | ORAL | Status: AC
  Filled 2019-10-26: qty 1

## 2019-10-26 MED ORDER — SODIUM CHLORIDE 0.9 % IV SOLN: 250.0000 mL | INTRAVENOUS | Status: DC | PRN

## 2019-10-26 MED ORDER — MIDAZOLAM HCL 2 MG/2ML IJ SOLN
INTRAMUSCULAR | Status: DC | PRN
  Administered 2019-10-26 (×2): 1 mg via INTRAVENOUS

## 2019-10-26 MED ORDER — HEPARIN (PORCINE) IN NACL 1000-0.9 UT/500ML-% IV SOLN
INTRAVENOUS | Status: AC
  Filled 2019-10-26: qty 500

## 2019-10-26 MED ORDER — SODIUM CHLORIDE 0.9% FLUSH: 3.0000 mL | Freq: Two times a day (BID) | INTRAVENOUS | Status: DC

## 2019-10-26 MED ORDER — FENTANYL CITRATE (PF) 100 MCG/2ML IJ SOLN
INTRAMUSCULAR | Status: AC
  Filled 2019-10-26: qty 2

## 2019-10-26 MED ORDER — CLOPIDOGREL BISULFATE 75 MG PO TABS: 75.0000 mg | ORAL_TABLET | Freq: Every day | ORAL | 11 refills | Status: DC

## 2019-10-26 MED ORDER — CLOPIDOGREL BISULFATE 300 MG PO TABS
ORAL_TABLET | ORAL | Status: DC | PRN
  Administered 2019-10-26: 300 mg via ORAL

## 2019-10-26 MED ORDER — FENTANYL CITRATE (PF) 100 MCG/2ML IJ SOLN
INTRAMUSCULAR | Status: DC | PRN
  Administered 2019-10-26: 25 ug via INTRAVENOUS
  Administered 2019-10-26: 50 ug via INTRAVENOUS
  Administered 2019-10-26: 25 ug via INTRAVENOUS

## 2019-10-26 MED ORDER — MIDAZOLAM HCL 2 MG/2ML IJ SOLN
INTRAMUSCULAR | Status: AC
  Filled 2019-10-26: qty 2

## 2019-10-26 MED ORDER — HEPARIN (PORCINE) IN NACL 1000-0.9 UT/500ML-% IV SOLN
INTRAVENOUS | Status: DC | PRN
  Administered 2019-10-26: 500 mL

## 2019-10-26 MED ORDER — MORPHINE SULFATE (PF) 2 MG/ML IV SOLN: 2.0000 mg | INTRAVENOUS | Status: DC | PRN

## 2019-10-26 MED ORDER — IODIXANOL 320 MG/ML IV SOLN
INTRAVENOUS | Status: DC | PRN
  Administered 2019-10-26: 75 mL

## 2019-10-26 MED ORDER — ONDANSETRON HCL 4 MG/2ML IJ SOLN: 4.0000 mg | Freq: Four times a day (QID) | INTRAMUSCULAR | Status: DC | PRN

## 2019-10-26 MED ORDER — LIDOCAINE HCL (PF) 1 % IJ SOLN
INTRAMUSCULAR | Status: DC | PRN
  Administered 2019-10-26: 15 mL

## 2019-10-26 MED ORDER — CLOPIDOGREL BISULFATE 75 MG PO TABS: 75.0000 mg | ORAL_TABLET | Freq: Every day | ORAL | Status: DC

## 2019-10-26 MED ORDER — SODIUM CHLORIDE 0.9% FLUSH: 3.0000 mL | INTRAVENOUS | Status: DC | PRN

## 2019-10-26 MED ORDER — CLOPIDOGREL BISULFATE 75 MG PO TABS: 300.0000 mg | ORAL_TABLET | Freq: Once | ORAL | Status: DC

## 2019-10-26 MED ORDER — OXYCODONE HCL 5 MG PO TABS: 5.0000 mg | ORAL_TABLET | ORAL | Status: DC | PRN

## 2019-10-26 MED ORDER — SODIUM CHLORIDE 0.9 % IV SOLN: INTRAVENOUS | Status: DC

## 2019-10-26 SURGICAL SUPPLY — 22 items
BAG SNAP BAND KOVER 36X36 (MISCELLANEOUS) ×2 IMPLANT
BALLN CHOCOLATE 6.0X40X120 (BALLOONS) ×3
BALLOON CHOCOLATE 6.0X40X120 (BALLOONS) IMPLANT
CATH ANGIO 5F BER2 65CM (CATHETERS) ×3 IMPLANT
CATH AURYON 5FR ATHEREC 1.5 (CATHETERS) ×3 IMPLANT
CATH OMNI FLUSH 5F 65CM (CATHETERS) ×2 IMPLANT
CLOSURE MYNX CONTROL 6F/7F (Vascular Products) ×2 IMPLANT
COVER DOME SNAP 22 D (MISCELLANEOUS) ×2 IMPLANT
DEVICE TORQUE .025-.038 (MISCELLANEOUS) ×2 IMPLANT
GLIDEWIRE ADV .035X180CM (WIRE) ×2 IMPLANT
KIT ENCORE 26 ADVANTAGE (KITS) ×2 IMPLANT
KIT MICROPUNCTURE NIT STIFF (SHEATH) ×3 IMPLANT
KIT PV (KITS) ×3 IMPLANT
SHEATH PINNACLE 5F 10CM (SHEATH) ×2 IMPLANT
SHEATH PINNACLE 6F 10CM (SHEATH) ×3 IMPLANT
SHEATH PINNACLE MP 6F 45CM (SHEATH) ×2 IMPLANT
SHEATH PROBE COVER 6X72 (BAG) ×3 IMPLANT
SYR MEDRAD MARK V 150ML (SYRINGE) ×2 IMPLANT
TRANSDUCER W/STOPCOCK (MISCELLANEOUS) ×3 IMPLANT
TRAY PV CATH (CUSTOM PROCEDURE TRAY) ×3 IMPLANT
WIRE BENTSON .035X145CM (WIRE) ×3 IMPLANT
WIRE SPARTACORE .014X300CM (WIRE) ×2 IMPLANT

## 2019-10-26 NOTE — Op Note (Signed)
    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 Randie Heinz, MD Procedure Performed: 1.  Ultrasound-guided cannulation left common femoral artery 2.  Aortogram and right lower extremity angiography 3.  Laser atherectomy of right common femoral artery and in graft stenosis 4.  Chocolate balloon angioplasty with 6 x 40 mm balloon of the endograft stenosis and common femoral artery 5.  Moderate sedation with fentanyl Versed for 42 minutes 6.  Minx device closure left common femoral artery  Indications: 69 year old male has a history of right common femoral endarterectomy and femoral to below-knee popliteal artery bypass.  He now has stenosis with decreased velocities distally is indicated for angiography possible intervention.  Findings: Aorta and iliac segments are free of flow-limiting stenosis.  The right common femoral artery had an approximately 80% stenosis after chocolate balloon angioplasty and laser atherectomy this was reduced to 0%.  Just 2 cm after the takeoff of the graft there was a proximal 90% stenosis and after laser atherectomy and chocolate angioplasty this also was reduced to 0%.  Left lower extremity is not evaluated given to the patient positioning.  He is patent otherwise in the graft and below the knee is three-vessel runoff to the foot.   Procedure:  The patient was identified in the holding area and taken to room 8.  The patient was then placed supine on the table and prepped and draped in the usual sterile fashion.  A time out was called.  Ultrasound was used to evaluate the left common femoral artery which was noted to be patent.  There is anesthetized 1% lidocaine cannulated with direct ultrasound visualization with micropuncture needle followed by wire  sheath.  And images saved the permanent record.  A Bentson wire was placed followed by 5 [place] sheath.  Omni catheter was placed to level L1 aortogram performed.  Given patient positioning I elected to cross the bifurcation perform right lower extremity angiography.  I selected the right common femoral artery to perform this.  The above 2 stenoses were noted.  He otherwise had three-vessel runoff.  I then used a Glidewire advantage to put in the profunda artery across the bifurcation with a long 6 [place] sheath.  He was fully heparinized.  I used a very catheter Glidewire vanished to access the graft.  I then exchanged for an 014 wire.  I performed laser atherectomy of the common femoral artery and then graft stenosis.  These were then balloon angioplasty with chocolate balloon at nominal pressure for 2 minutes.  Completion demonstrated complete resolution of both stenoses.  Satisfied we exchanged for a Glidewire advantage again placed a short 6 [place] sheath and deployed a minx device.  He tolerated procedure without any complication.  Contrast: 75cc  Zymarion Favorite C. Randie Heinz, MD [facility] [place] Office: [phone] Pager: [phone]

## 2019-10-26 NOTE — H&P (Signed)
   History and Physical Update  The patient was interviewed and re-examined.  The patient's previous History and Physical has been reviewed and is unchanged from recent office visit. Plan for aortogram with possible intervention.   [name], MD [facility] [place] Office: [phone] Pager: [phone]  10/26/2019, 7:32 AM

## 2019-10-26 NOTE — Discharge Instructions (Signed)
° °  [facility] [place] ° °Discharge Instructions ° °Lower Extremity Angiogram; Angioplasty/Stenting ° °Please refer to the following instructions for your post-procedure care. Your surgeon or physician assistant will discuss any changes with you. ° °Activity ° °Avoid lifting more than 8 pounds (1 gallons of milk) for 72 hours (3 days) after your procedure. You may walk as much as you can tolerate. It's OK to drive after 72 hours. ° °Bathing/Showering ° °You may shower the day after your procedure. If you have a bandage, you may remove it at 24- 48 hours. Clean your incision site with mild soap and water. Pat the area dry with a clean towel. ° °Diet ° °Resume your pre-procedure diet. There are no special food restrictions following this procedure. All patients with peripheral vascular disease should follow a low fat/low cholesterol diet. In order to heal from your surgery, it is CRITICAL to get adequate nutrition. Your body requires vitamins, minerals, and protein. Vegetables are the best source of vitamins and minerals. Vegetables also provide the perfect balance of protein. Processed food has little nutritional value, so try to avoid this. ° °Medications ° °Resume taking all of your medications unless your doctor tells you not to. If your incision is causing pain, you may take over-the-counter pain relievers such as acetaminophen (Tylenol) ° °Follow Up ° °Follow up will be arranged at the time of your procedure. You may have an office visit scheduled or may be scheduled for surgery. Ask your surgeon if you have any questions. ° °Please call us immediately for any of the following conditions: °•Severe or worsening pain your legs or feet at rest or with walking. °•Increased pain, redness, drainage at your groin puncture site. °•Fever of 101 degrees or higher. °•If you have any mild or slow bleeding from your puncture site: lie down, apply firm constant pressure over the area with a piece of  gauze or a clean wash cloth for 30 minutes- no peeking!, call 911 right away if you are still bleeding after 30 minutes, or if the bleeding is heavy and unmanageable. ° °Reduce your risk factors of vascular disease: ° °Stop smoking. If you would like help call [facility] at 1-800-QUIT-NOW (1-800-784-8669) or [facility] at 336-586-4000. °Manage your cholesterol °Maintain a desired weight °Control your diabetes °Keep your blood pressure down ° °If you have any questions, please call the office at [phone] ° °

## 2019-11-16 ENCOUNTER — Ambulatory Visit: Payer: Medicare HMO | Attending: Internal Medicine

## 2019-11-16 DIAGNOSIS — Z23 Encounter for immunization: Secondary | ICD-10-CM | POA: Insufficient documentation

## 2019-11-16 NOTE — Progress Notes (Signed)
   Covid-19 Vaccination Clinic  Name:  Brandon Collins    MRN: [id number] DOB: September 05, 1951  11/16/2019  Mr. Brandon Collins was observed post Covid-19 immunization for 15 minutes without incidence. He was provided with Vaccine Information Sheet and instruction to access the V-Safe system.   Mr. Brandon Collins was instructed to call 911 with any severe reactions post vaccine: Marland Kitchen Difficulty breathing  . Swelling of your face and throat  . A fast heartbeat  . A bad rash all over your body  . Dizziness and weakness    Immunizations Administered    Name Date Dose VIS Date Route   Pfizer COVID-19 Vaccine 11/16/2019  8:56 AM 0.3 mL 08/28/2019 Intramuscular   Manufacturer: [employer], [employer]   Lot: [id number]   NDC: [address]-6

## 2019-11-30 ENCOUNTER — Ambulatory Visit (INDEPENDENT_AMBULATORY_CARE_PROVIDER_SITE_OTHER): Payer: Medicare HMO | Admitting: Podiatry

## 2019-11-30 DIAGNOSIS — Z5329 Procedure and treatment not carried out because of patient's decision for other reasons: Secondary | ICD-10-CM

## 2019-12-01 NOTE — Progress Notes (Signed)
No show for appt. 

## 2019-12-03 ENCOUNTER — Telehealth (HOSPITAL_COMMUNITY): Payer: Self-pay

## 2019-12-03 ENCOUNTER — Other Ambulatory Visit: Payer: Self-pay | Admitting: *Deleted

## 2019-12-03 DIAGNOSIS — I739 Peripheral vascular disease, unspecified: Secondary | ICD-10-CM

## 2019-12-03 NOTE — Telephone Encounter (Signed)

## 2019-12-04 ENCOUNTER — Ambulatory Visit (INDEPENDENT_AMBULATORY_CARE_PROVIDER_SITE_OTHER): Payer: Medicare HMO | Admitting: Physician Assistant

## 2019-12-04 ENCOUNTER — Ambulatory Visit (INDEPENDENT_AMBULATORY_CARE_PROVIDER_SITE_OTHER)
Admit: 2019-12-04 | Discharge: 2019-12-04 | Disposition: A | Discharge status: Home / Self Care | Payer: Medicare HMO | Attending: Vascular Surgery | Admitting: Vascular Surgery

## 2019-12-04 ENCOUNTER — Other Ambulatory Visit: Payer: Self-pay

## 2019-12-04 ENCOUNTER — Ambulatory Visit (HOSPITAL_COMMUNITY)
Admission: RE | Admit: 2019-12-04 | Discharge: 2019-12-04 | Disposition: A | Discharge status: Home / Self Care | Payer: Medicare HMO | Source: Ambulatory Visit | Attending: Vascular Surgery | Admitting: Vascular Surgery

## 2019-12-04 VITALS — BP 152/80 | HR 88 | Temp 98.0°F | Ht 72.0 in | Wt 256.4 lb

## 2019-12-04 DIAGNOSIS — I739 Peripheral vascular disease, unspecified: Secondary | ICD-10-CM

## 2019-12-04 DIAGNOSIS — L97311 Non-pressure chronic ulcer of right ankle limited to breakdown of skin: Secondary | ICD-10-CM | POA: Diagnosis not present

## 2019-12-04 NOTE — Progress Notes (Signed)
Patient ID: Brandon Collins, male   DOB: 1951-04-06, 69 y.o.   MRN: [id number]  Reason for Consult: No chief complaint on file.   Referred by Nonnie Done., MD  Subjective:     HPI:  Brandon Collins is a 69 y.o. male has a history of right common femoral endarterectomy and femoral to below-knee popliteal artery bypass.  He he was recently found to have a stenosis with decreased velocities distally and therefore underwent aortogram with right lower extremity angiography.  Laser atherectomy and angioplasty of the right CFA and in-graft stenosis was performed on 10/26/2019 by Dr. Randie Heinz.  He returns today for follow-up noninvasive studies.  He has chronic right lower extremity edema greater than left and currently is being followed at [facility] wound care center for right lower leg ulcers.  He denies pain with ambulation.  He uses a rolling walker Past Medical History:  Diagnosis Date  . Arthritis    "all over" (08/12/2018)  . Complication of anesthesia 07/2018   "out of it for 2 weeks."  "Low Blood Sugar."  . High cholesterol   . HOH (hard of hearing)   . Hypertension   . Type II diabetes mellitus (HCC)   . Ulcer of ankle, right, with unspecified severity (HCC)    Family History  Problem Relation Age of Onset  . Cancer Mother   . Stroke Father        multiple   Past Surgical History:  Procedure Laterality Date  . ABDOMINAL AORTOGRAM W/LOWER EXTREMITY N/A 01/22/2019   Procedure: ABDOMINAL AORTOGRAM W/LOWER EXTREMITY;  Surgeon: Maeola Harman, MD;  Location: [facility] INVASIVE CV LAB;  Service: Cardiovascular;  Laterality: N/A;  rt leg  . ABDOMINAL AORTOGRAM W/LOWER EXTREMITY N/A 10/26/2019   Procedure: ABDOMINAL AORTOGRAM W/LOWER EXTREMITY;  Surgeon: Maeola Harman, MD;  Location: [facility] INVASIVE CV LAB;  Service: Cardiovascular;  Laterality: N/A;  Rt lower extermity   . ANTERIOR CERVICAL DECOMP/DISCECTOMY FUSION  08/12/2018   CERVICAL 3-4, CERVICAL 4-5, CERVICAL 5-6, CERVICAL  6-7 WITH INSTRUMENTATION AND ALLOGRAFT  . ANTERIOR CERVICAL DECOMPRESSION/DISCECTOMY FUSION 4 LEVELS N/A 08/12/2018   Procedure: ANTERIOR CERVICAL DECOMPRESSION FUSION CERVICAL 3-4, CERVICAL 4-5, CERVICAL 5-6, CERVICAL 6-7 WITH INSTRUMENTATION AND ALLOGRAFT;  Surgeon: Estill Bamberg, MD;  Location: [facility] OR;  Service: Orthopedics;  Laterality: N/A;  ANTERIOR CERVICAL DECOMPRESSION FUSION CERVICAL 3-4, CERVICAL 4-5, CERVICAL 5-6, CERVICAL 6-7 WITH INSTRUMENTATION AND ALLOGRAFT  . COLONOSCOPY W/ POLYPECTOMY    . FEMORAL-POPLITEAL BYPASS GRAFT Right 01/27/2019   Procedure: right FEMORAL ENDARTERECTOMY, RIGHT  FEMORAL- BELOW THE KNEE POPLITEAL ARTERY BYPASS GRAFT using 16mm propaten ringed gore graft, attempted right femoral to below knee popliteal artery bypass using right nonreversed great saphenous vein;  Surgeon: Maeola Harman, MD;  Location: [facility] OR;  Service: Vascular;  Laterality: Right;  . INGUINAL HERNIA REPAIR  1958   "? side"  . KNEE ARTHROSCOPY Right   . PERIPHERAL VASCULAR ATHERECTOMY  10/26/2019   Procedure: PERIPHERAL VASCULAR ATHERECTOMY;  Surgeon: Maeola Harman, MD;  Location: [facility] INVASIVE CV LAB;  Service: Cardiovascular;;  LASER  . PILONIDAL CYST EXCISION      Short Social History:  Social History   Tobacco Use  . Smoking status: Former Smoker    Packs/day: 1.00    Years: 30.00    Pack years: 30.00    Types: Cigarettes  . Smokeless tobacco: Never Used  . Tobacco comment: quit in late 1990's  Substance Use Topics  . Alcohol use: Not Currently  Comment: 08/12/2018 "nothing in the 2000s"    No Known Allergies  Current Outpatient Medications  Medication Sig Dispense Refill  . amLODipine (NORVASC) 10 MG tablet Take 10 mg by mouth daily.    Marland Kitchen aspirin EC 81 MG tablet Take 81 mg by mouth daily.    Marland Kitchen atorvastatin (LIPITOR) 20 MG tablet Take 20 mg by mouth 4 (four) times a week. [name], Thur, and Sat  1  . cholecalciferol (VITAMIN D) 1000 units tablet Take  1 tablet (1,000 Units total) by mouth daily.    . clopidogrel (PLAVIX) 75 MG tablet Take 1 tablet (75 mg total) by mouth daily. 30 tablet 11  . feeding supplement, ENSURE ENLIVE, (ENSURE ENLIVE) LIQD Take 237 mLs by mouth 2 (two) times daily between meals. 237 mL 12  . glimepiride (AMARYL) 4 MG tablet Take 1 tablet (4 mg total) by mouth every evening.    . hydrochlorothiazide (HYDRODIURIL) 25 MG tablet Take 25 mg by mouth daily.    Marland Kitchen lisinopril (ZESTRIL) 20 MG tablet Take 20 mg by mouth daily.    [name] ULTRA test strip USE TO TEST FASTING SUGAR ONCE DAILY IN THE MORNING    . pantoprazole (PROTONIX) 40 MG tablet Take 1 tablet (40 mg total) by mouth daily with supper. (Patient not taking: Reported on 10/23/2019)     No current facility-administered medications for this visit.    REVIEW OF SYSTEMS  See HPI     Objective:  Objective  Vitals:   12/04/19 1318  Weight: 256 lb 6.4 oz (116.3 kg)  Height: 6' (1.829 m)    Physical Exam  Patient is a well-developed well-nourished male in no apparent distress Respiratory: Nonlabored Cardiac heart rate and rhythm are regular Lower extremities: 2+ pitting edema of the right lower extremity.  1+ edema of the left foot and ankle.  Both feet are warm and well perfused. Pulses: 2+ right femoral pulse.  Nonpalpable right popliteal and pedal pulses.  I am unable to palpate left lower extremity pulses.  I was able to obtain triphasic bilateral Doppler dorsalis pedis pulses.  He has bilateral peroneal and posterior tibial signals as well     Data: ABI/TBIToday's ABIToday's TBIPrevious ABIPrevious TBI  +-------+-----------+-----------+------------+------------+  Right Oto     0.55    0.80    0.51      +-------+-----------+-----------+------------+------------+  Left  0.66    0.64    0.68    0.41        Right Graft #1:  +------------------+--------+--------+----------+--------+            PSV cm/sStenosisWaveform Comments  +------------------+--------+--------+----------+--------+  Inflow      148       biphasic       +------------------+--------+--------+----------+--------+  Prox Anastomosis 204       triphasic       +------------------+--------+--------+----------+--------+  Proximal Graft  210       biphasic       +------------------+--------+--------+----------+--------+  Mid Graft     101       biphasic       +------------------+--------+--------+----------+--------+  Distal Graft   84       triphasic       +------------------+--------+--------+----------+--------+  Distal Anastomosis205       biphasic       +------------------+--------+--------+----------+--------+  Outflow      178       monophasicbrisk    +------------------+--------+--------+----------+--------+   Assessment/Plan:    69 year old male who underwent right femoral to below-knee  bypass with PTFE graft in May of last year.  He recently underwent atherectomy and angioplasty of the CFA and endograft stenosis.  He now has triphasic and biphasic signals by arterial duplex imaging.  ABI is stable on the left.  Unable to obtain ABI on the right. Encouraged him to do as much walking and exercise as he is able.  Continue aspirin Plavix and statin. Continue local wound care as per wound care center instructions. Follow-up in 6 months with duplex and ABIs    Milinda Antis, [name] [facility]  [facility] MD:

## 2019-12-07 ENCOUNTER — Other Ambulatory Visit: Payer: Self-pay | Admitting: *Deleted

## 2019-12-07 DIAGNOSIS — I739 Peripheral vascular disease, unspecified: Secondary | ICD-10-CM

## 2019-12-15 ENCOUNTER — Ambulatory Visit: Payer: Medicare HMO | Attending: Internal Medicine

## 2019-12-15 DIAGNOSIS — Z23 Encounter for immunization: Secondary | ICD-10-CM

## 2019-12-15 NOTE — Progress Notes (Signed)
   Covid-19 Vaccination Clinic  Name:  TILTON MARSALIS    MRN: [id number] DOB: 29-Nov-1950  12/15/2019  Mr. Laws was observed post Covid-19 immunization for 15 minutes without incident. He was provided with Vaccine Information Sheet and instruction to access the V-Safe system.   Mr. Dobler was instructed to call 911 with any severe reactions post vaccine: Marland Kitchen Difficulty breathing  . Swelling of face and throat  . A fast heartbeat  . A bad rash all over body  . Dizziness and weakness   Immunizations Administered    Name Date Dose VIS Date Route   Pfizer COVID-19 Vaccine 12/15/2019  8:58 AM 0.3 mL 08/28/2019 Intramuscular   Manufacturer: [employer], [employer]   Lot: [id number]   NDC: [address]-9

## 2020-01-28 ENCOUNTER — Encounter: Payer: Self-pay | Admitting: Sports Medicine

## 2020-01-28 ENCOUNTER — Other Ambulatory Visit: Payer: Self-pay

## 2020-01-28 ENCOUNTER — Ambulatory Visit: Payer: Medicare HMO | Admitting: Sports Medicine

## 2020-01-28 DIAGNOSIS — M79675 Pain in left toe(s): Secondary | ICD-10-CM

## 2020-01-28 DIAGNOSIS — L97919 Non-pressure chronic ulcer of unspecified part of right lower leg with unspecified severity: Secondary | ICD-10-CM

## 2020-01-28 DIAGNOSIS — I739 Peripheral vascular disease, unspecified: Secondary | ICD-10-CM | POA: Diagnosis not present

## 2020-01-28 DIAGNOSIS — M79674 Pain in right toe(s): Secondary | ICD-10-CM

## 2020-01-28 DIAGNOSIS — B351 Tinea unguium: Secondary | ICD-10-CM | POA: Diagnosis not present

## 2020-01-28 DIAGNOSIS — I83019 Varicose veins of right lower extremity with ulcer of unspecified site: Secondary | ICD-10-CM

## 2020-01-28 DIAGNOSIS — R600 Localized edema: Secondary | ICD-10-CM

## 2020-01-28 NOTE — Progress Notes (Signed)
Subjective: Brandon Collins is a 69 y.o. male patient with history of diabetes who presents to office today complaining of long,mildly painful nails  while ambulating in shoes; unable to trim. Patient states that the glucose reading this morning was 103 mg/dl. Patient denies any new changes in medication or new problems still seeing the wound center for right ankle ulcer. No other issues noted.  A1c 6.7 PCP Dr. Glennis Brink Nov  Patient Active Problem List   Diagnosis Date Noted  . PAD (peripheral artery disease) ([place]) 01/27/2019  . Ankle ulcer, right, limited to breakdown of skin ([place]) 10/21/2018  . Primary osteoarthritis of right knee 10/21/2018  . Hyponatremia   . Pain   . Cervical myelopathy ([place]) 08/19/2018  . Cervical cord compression with myelopathy ([place])   . Postoperative pain   . Diabetes mellitus type 2 in nonobese (HCC)   . Essential hypertension   . Dyslipidemia   . Drug induced constipation   . Myelopathy ([place]) 08/12/2018   Current Outpatient Medications on File Prior to Visit  Medication Sig Dispense Refill  . amLODipine (NORVASC) 10 MG tablet Take 10 mg by mouth daily.    Marland Kitchen aspirin EC 81 MG tablet Take 81 mg by mouth daily.    Marland Kitchen atorvastatin (LIPITOR) 20 MG tablet Take 20 mg by mouth 4 (four) times a week. [name], Thur, and Sat  1  . cholecalciferol (VITAMIN D) 1000 units tablet Take 1 tablet (1,000 Units total) by mouth daily.    . clopidogrel (PLAVIX) 75 MG tablet Take 1 tablet (75 mg total) by mouth daily. 30 tablet 11  . feeding supplement, ENSURE ENLIVE, (ENSURE ENLIVE) LIQD Take 237 mLs by mouth 2 (two) times daily between meals. 237 mL 12  . glimepiride (AMARYL) 4 MG tablet Take 1 tablet (4 mg total) by mouth every evening.    . hydrochlorothiazide (HYDRODIURIL) 25 MG tablet Take 25 mg by mouth daily.    Marland Kitchen lisinopril (ZESTRIL) 20 MG tablet Take 20 mg by mouth daily.    [name] ULTRA test strip USE TO TEST FASTING SUGAR ONCE DAILY IN THE MORNING    .  pioglitazone (ACTOS) 15 MG tablet Take 15 mg by mouth daily.     No current facility-administered medications on file prior to visit.   No Known Allergies  No results found for this or any previous visit (from the past 2160 hour(s)).  Objective: General: Patient is awake, alert, and oriented x 3 and in no acute distress.  Integument: Skin is warm, dry and supple bilateral. Nails are tender, long, thickened and dystrophic with subungual debris, consistent with onychomycosis, 1-5 bilateral. No signs of infection. Bandage to right ankle. Remaining integument unremarkable.  Vasculature:  Dorsalis Pedis pulse 0/4 bilateral. Posterior Tibial pulse  0/4 bilateral. Capillary fill time <5 sec 1-5 bilateral. No hair growth to the level of the digits.Temperature gradient within normal limits. No varicosities present bilateral. 2+ pitting edema present bilateral.   Neurology: The patient has intact sensation measured with a 5.07/10g Semmes Weinstein Monofilament at all pedal sites bilateral . Vibratory sensation diminished bilateral with tuning fork. No Babinski sign present bilateral.   Musculoskeletal: Asymptomatic pes planus pedal deformities noted bilateral. Muscular strength 5/5 in all lower extremity muscular groups bilateral without pain on range of motion. No tenderness with calf compression bilateral.  Assessment and Plan: Problem List Items Addressed This Visit      Cardiovascular and Mediastinum   PAD (peripheral artery disease) ([place])  Other Visit Diagnoses    Pain due to onychomycosis of toenails of both feet    -  Primary   Venous ulcer of right leg (HCC)       Lower extremity edema          -Examined patient. -Discussed and educated patient on diabetic foot care, especially with  regards to the vascular, neurological and musculoskeletal systems.  -Stressed the importance of good glycemic control and the detriment of not  controlling glucose levels in relation to the  foot. -Mechanically debrided all nails 1-5 bilateral using sterile nail nipper and filed with dremel without incident  -Continue with wound care follow-up for right ankle ulcer with wound care center -Answered all patient questions -Patient to return  in 3 months for at risk foot care -Patient advised to call the office if any problems or questions arise in the meantime.  Asencion Islam, DPM

## 2020-05-05 ENCOUNTER — Encounter: Payer: Self-pay | Admitting: Podiatry

## 2020-05-05 ENCOUNTER — Other Ambulatory Visit: Payer: Self-pay

## 2020-05-05 ENCOUNTER — Ambulatory Visit: Payer: Medicare HMO | Admitting: Podiatry

## 2020-05-05 VITALS — Temp 97.8°F

## 2020-05-05 DIAGNOSIS — R600 Localized edema: Secondary | ICD-10-CM

## 2020-05-05 DIAGNOSIS — E1151 Type 2 diabetes mellitus with diabetic peripheral angiopathy without gangrene: Secondary | ICD-10-CM

## 2020-05-05 DIAGNOSIS — M79674 Pain in right toe(s): Secondary | ICD-10-CM

## 2020-05-05 DIAGNOSIS — L601 Onycholysis: Secondary | ICD-10-CM

## 2020-05-05 DIAGNOSIS — B351 Tinea unguium: Secondary | ICD-10-CM

## 2020-05-05 DIAGNOSIS — M79675 Pain in left toe(s): Secondary | ICD-10-CM | POA: Diagnosis not present

## 2020-05-07 NOTE — Progress Notes (Signed)
Subjective:  Patient ID: Brandon Collins, male    DOB: Aug 14, 1951,  MRN: [id number]  69 y.o. male presents with preventative diabetic foot care and painful thick toenails that are difficult to trim. Pain interferes with ambulation. Aggravating factors include wearing enclosed shoe gear. Pain is relieved with periodic professional debridement.   He states he really enjoyed the [place] vacation he and his wife took. He states he has also progressed from the wheelchair to the rollator. He hopes to walk independent of any aids in the near future.  Review of Systems: Negative except as noted in the HPI.  Past Medical History:  Diagnosis Date  . Arthritis    "all over" (08/12/2018)  . Complication of anesthesia 07/2018   "out of it for 2 weeks."  "Low Blood Sugar."  . High cholesterol   . HOH (hard of hearing)   . Hypertension   . Type II diabetes mellitus (HCC)   . Ulcer of ankle, right, with unspecified severity [facility])    Past Surgical History:  Procedure Laterality Date  . ABDOMINAL AORTOGRAM W/LOWER EXTREMITY N/A 01/22/2019   Procedure: ABDOMINAL AORTOGRAM W/LOWER EXTREMITY;  Surgeon: Maeola Harman, MD;  Location: [facility] INVASIVE CV LAB;  Service: Cardiovascular;  Laterality: N/A;  rt leg  . ABDOMINAL AORTOGRAM W/LOWER EXTREMITY N/A 10/26/2019   Procedure: ABDOMINAL AORTOGRAM W/LOWER EXTREMITY;  Surgeon: Maeola Harman, MD;  Location: [facility] INVASIVE CV LAB;  Service: Cardiovascular;  Laterality: N/A;  Rt lower extermity   . ANTERIOR CERVICAL DECOMP/DISCECTOMY FUSION  08/12/2018   CERVICAL 3-4, CERVICAL 4-5, CERVICAL 5-6, CERVICAL 6-7 WITH INSTRUMENTATION AND ALLOGRAFT  . ANTERIOR CERVICAL DECOMPRESSION/DISCECTOMY FUSION 4 LEVELS N/A 08/12/2018   Procedure: ANTERIOR CERVICAL DECOMPRESSION FUSION CERVICAL 3-4, CERVICAL 4-5, CERVICAL 5-6, CERVICAL 6-7 WITH INSTRUMENTATION AND ALLOGRAFT;  Surgeon: Estill Bamberg, MD;  Location: [facility] OR;  Service: Orthopedics;  Laterality: N/A;  ANTERIOR  CERVICAL DECOMPRESSION FUSION CERVICAL 3-4, CERVICAL 4-5, CERVICAL 5-6, CERVICAL 6-7 WITH INSTRUMENTATION AND ALLOGRAFT  . COLONOSCOPY W/ POLYPECTOMY    . FEMORAL-POPLITEAL BYPASS GRAFT Right 01/27/2019   Procedure: right FEMORAL ENDARTERECTOMY, RIGHT  FEMORAL- BELOW THE KNEE POPLITEAL ARTERY BYPASS GRAFT using 38mm propaten ringed gore graft, attempted right femoral to below knee popliteal artery bypass using right nonreversed great saphenous vein;  Surgeon: Maeola Harman, MD;  Location: [facility] OR;  Service: Vascular;  Laterality: Right;  . INGUINAL HERNIA REPAIR  1958   "? side"  . KNEE ARTHROSCOPY Right   . PERIPHERAL VASCULAR ATHERECTOMY  10/26/2019   Procedure: PERIPHERAL VASCULAR ATHERECTOMY;  Surgeon: Maeola Harman, MD;  Location: [facility] INVASIVE CV LAB;  Service: Cardiovascular;;  LASER  . PILONIDAL CYST EXCISION     Patient Active Problem List   Diagnosis Date Noted  . Flexion contracture of knee, right 08/18/2019  . PAD (peripheral artery disease) (HCC) 01/27/2019  . Ankle ulcer, right, limited to breakdown of skin (HCC) 10/21/2018  . Primary osteoarthritis of right knee 10/21/2018  . Hyponatremia   . Pain   . Cervical myelopathy (HCC) 08/19/2018  . Cervical cord compression with myelopathy (HCC)   . Postoperative pain   . Diabetes mellitus type 2 in nonobese (HCC)   . Essential hypertension   . Dyslipidemia   . Drug induced constipation   . Myelopathy (HCC) 08/12/2018    Current Outpatient Medications:  .  amLODipine (NORVASC) 10 MG tablet, Take 10 mg by mouth daily., Disp: , Rfl:  .  aspirin EC 81 MG tablet, Take 81 mg by  mouth daily., Disp: , Rfl:  .  atorvastatin (LIPITOR) 20 MG tablet, Take 20 mg by mouth 4 (four) times a week. Evern Bio, Thur, and Sat, Disp: , Rfl: 1 .  cholecalciferol (VITAMIN D) 1000 units tablet, Take 1 tablet (1,000 Units total) by mouth daily., Disp: , Rfl:  .  clopidogrel (PLAVIX) 75 MG tablet, Take 1 tablet (75 mg total) by mouth  daily., Disp: 30 tablet, Rfl: 11 .  feeding supplement, ENSURE ENLIVE, (ENSURE ENLIVE) LIQD, Take 237 mLs by mouth 2 (two) times daily between meals., Disp: 237 mL, Rfl: 12 .  glimepiride (AMARYL) 4 MG tablet, Take 1 tablet (4 mg total) by mouth every evening., Disp: , Rfl:  .  hydrochlorothiazide (HYDRODIURIL) 25 MG tablet, Take 25 mg by mouth daily., Disp: , Rfl:  .  lisinopril (ZESTRIL) 20 MG tablet, Take 20 mg by mouth daily., Disp: , Rfl:  .  lisinopril (ZESTRIL) 40 MG tablet, Take 40 mg by mouth daily., Disp: , Rfl:  .  ONETOUCH ULTRA test strip, USE TO TEST FASTING SUGAR ONCE DAILY IN THE MORNING, Disp: , Rfl:  .  pioglitazone (ACTOS) 15 MG tablet, Take 15 mg by mouth daily., Disp: , Rfl:  No Known Allergies Social History   Tobacco Use  Smoking Status Former Smoker  . Packs/day: 1.00  . Years: 30.00  . Pack years: 30.00  . Types: Cigarettes  Smokeless Tobacco Never Used  Tobacco Comment   quit in late 1990's   Objective:   Vitals:   05/05/20 1209  Temp: 97.8 F (36.6 C)   Constitutional Patient is a pleasant 69 y.o. Caucasian male morbidly obese in NAD.Marland Kitchen AAO x 3.  Vascular Capillary refill time to digits <4 seconds b/l lower extremities. Nonpalpable DP pulse(s) b/l lower extremities. Nonpalpable PT pulse(s) b/l lower extremities. Pedal hair absent. Lower extremity skin temperature gradient within normal limits. No pain with calf compression b/l. +2 pitting edema b/l lower extremities. No cyanosis or clubbing noted.  Neurologic Normal speech. Oriented to person, place, and time. Protective sensation intact 5/5 intact bilaterally with 10g monofilament b/l. Vibratory sensation diminished b/l.  Dermatologic Pedal skin with normal turgor, texture and tone bilaterally. No open wounds bilaterally. No interdigital macerations bilaterally. Toenails 1-5 b/l elongated, discolored, dystrophic, thickened, crumbly with subungual debris and tenderness to dorsal palpation. There is noted  onchyolysis of entire nailplate of the left 2nd toe. The nailbed remains intact. There is no erythema, no edema, no drainage, no underlying flocculence.  Orthopedic: Normal muscle strength 5/5 to all lower extremity muscle groups bilaterally. No pain crepitus or joint limitation noted with ROM b/l. Pes planus deformity noted b/l.    Assessment:   1. Pain due to onychomycosis of toenails of both feet   2. Onycholysis of toenail   3. Lower extremity edema   4. Type II diabetes mellitus with peripheral circulatory disorder [facility])    Plan:  Patient was evaluated and treated and all questions answered.  Onychomycosis with pain -Nails palliatively debridement as below. -Educated on self-care  Procedure: Nail Debridement Rationale: Pain Type of Debridement: manual, sharp debridement. Instrumentation: Nail nipper, rotary burr. Number of Nails: 10  -Examined patient. -Continue diabetic foot care principles. -Toenails 1-5 b/l were debrided in length and girth with sterile nail nippers and dremel without iatrogenic bleeding. Left 2nd digit nailplate gently debrided from it's remaining attachment to digit. Nailbed cleansed with alcohol. Triple antibiotic ointment applied. No further treatment required. -Patient to report any pedal injuries to medical  professional immediately. -Patient to continue soft, supportive shoe gear daily. -Patient/POA to call should there be question/concern in the interim.  Return in about 3 months (around 08/05/2020).  Freddie Breech, DPM

## 2020-06-14 ENCOUNTER — Encounter: Payer: Self-pay | Admitting: Podiatry

## 2020-07-18 IMAGING — RF DG CERVICAL SPINE 1V
1 series · 3 of 3 positions shown · non-contrast
Comparison: None available.

CLINICAL DATA: Intraoperative localization for ACDF at C3-4 through
C6-7.

EXAM:
DG C-ARM 61-120 MIN; DG CERVICAL SPINE - 1 VIEW

[Series 1: run · 3 of 3 slices shown]
[im 1/3]
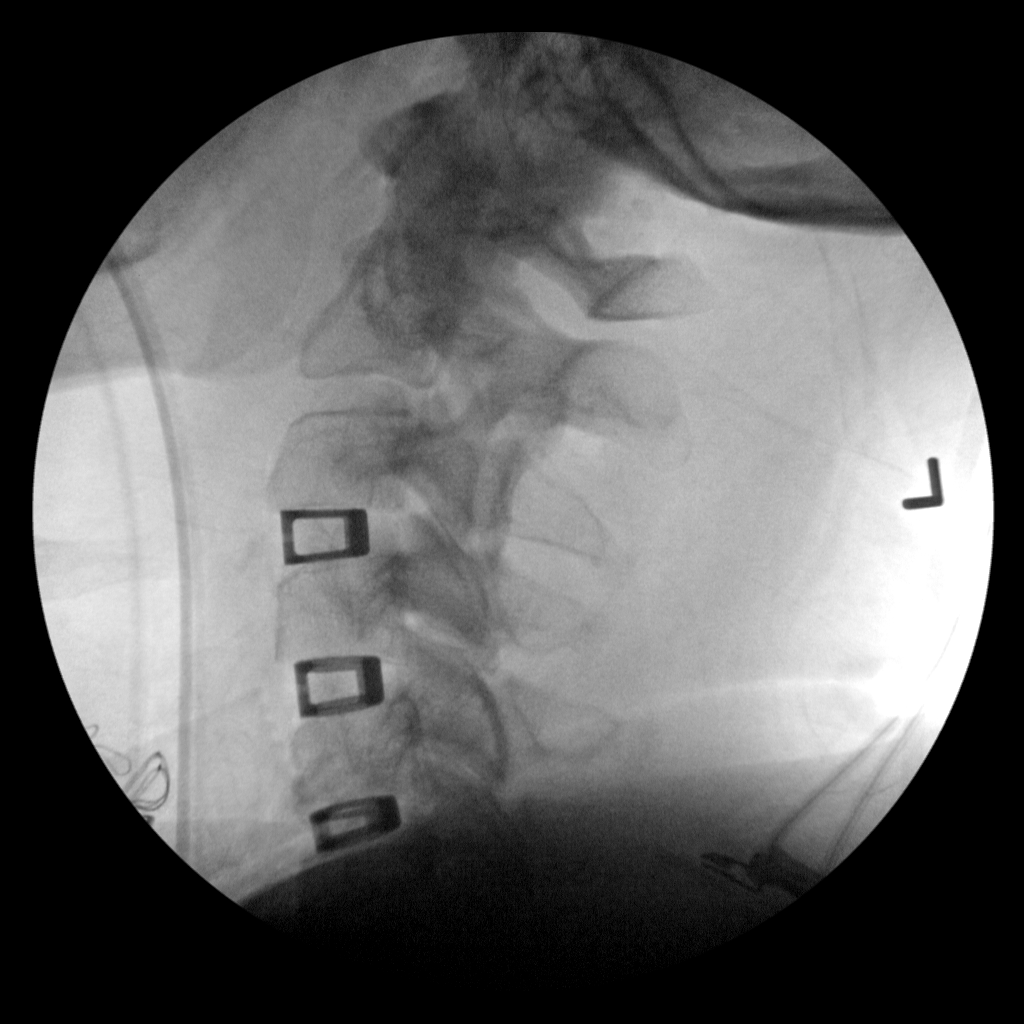
[im 2/3]
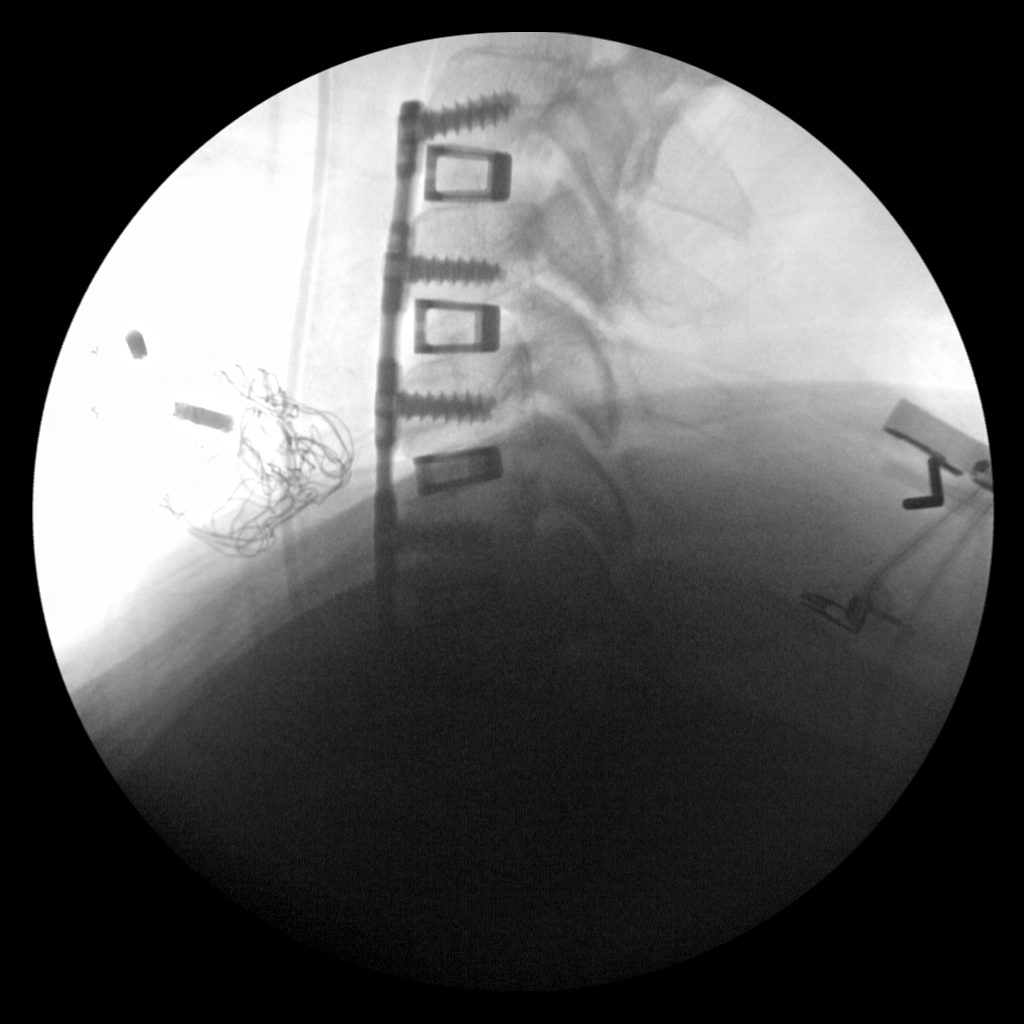
[im 3/3]
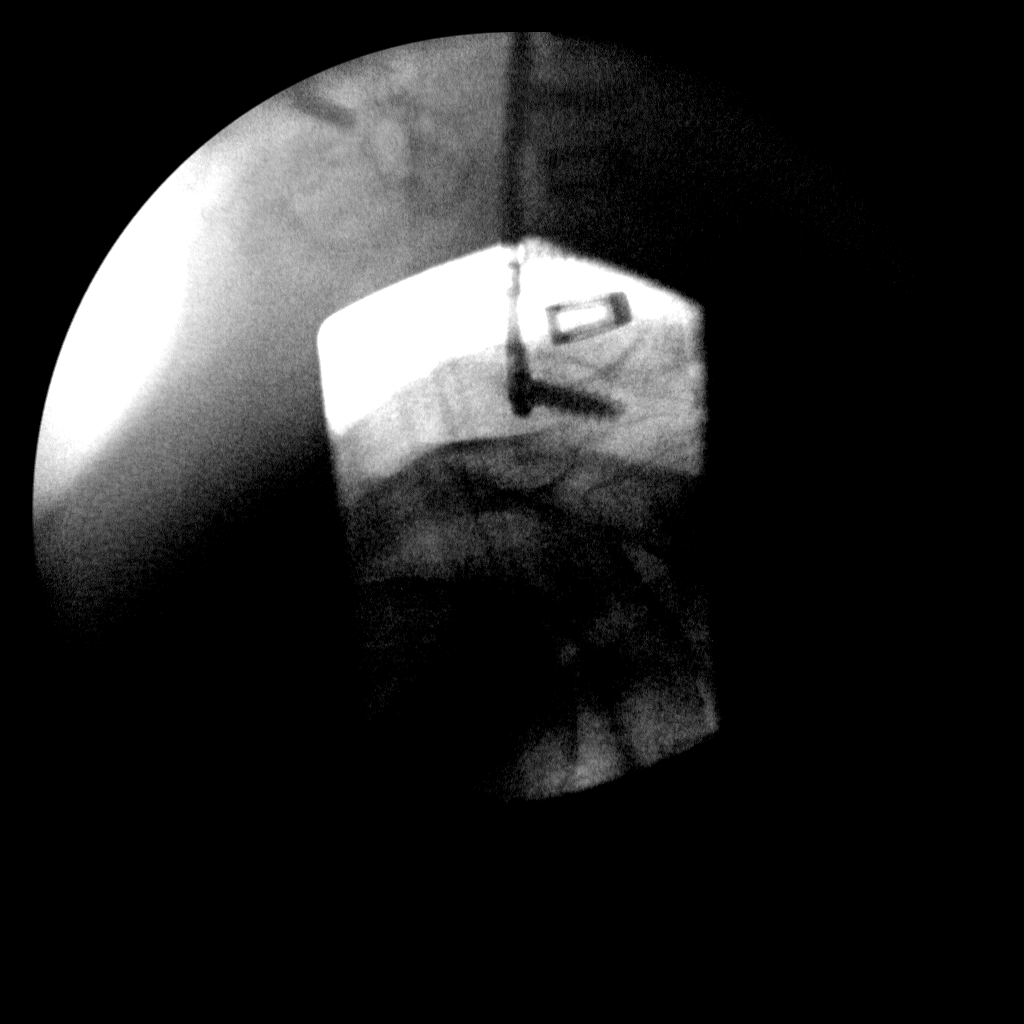

[3 of 3 positions shown; findings below may reference images not displayed]

FINDINGS: Intraoperative fluoroscopic lateral views of the cervical spine
demonstrate ACDF at C3-4, C4-5, C5-6, and C6-7. Hardware appears
well positioned without complication. Alignment normal. No adverse
features.

Fluoroscopic time equals 17 seconds.
IMPRESSION: Fluoroscopic intraoperative localization for ACDF at C3-4 through
C6-7.

## 2020-07-27 IMAGING — CR DG ABDOMEN 1V
3 series · 3 of 3 positions shown · non-contrast
Comparison: No recent prior.

CLINICAL DATA: Ileus.

EXAM:
ABDOMEN - 1 VIEW

[abdomen kub (1 of 3)]
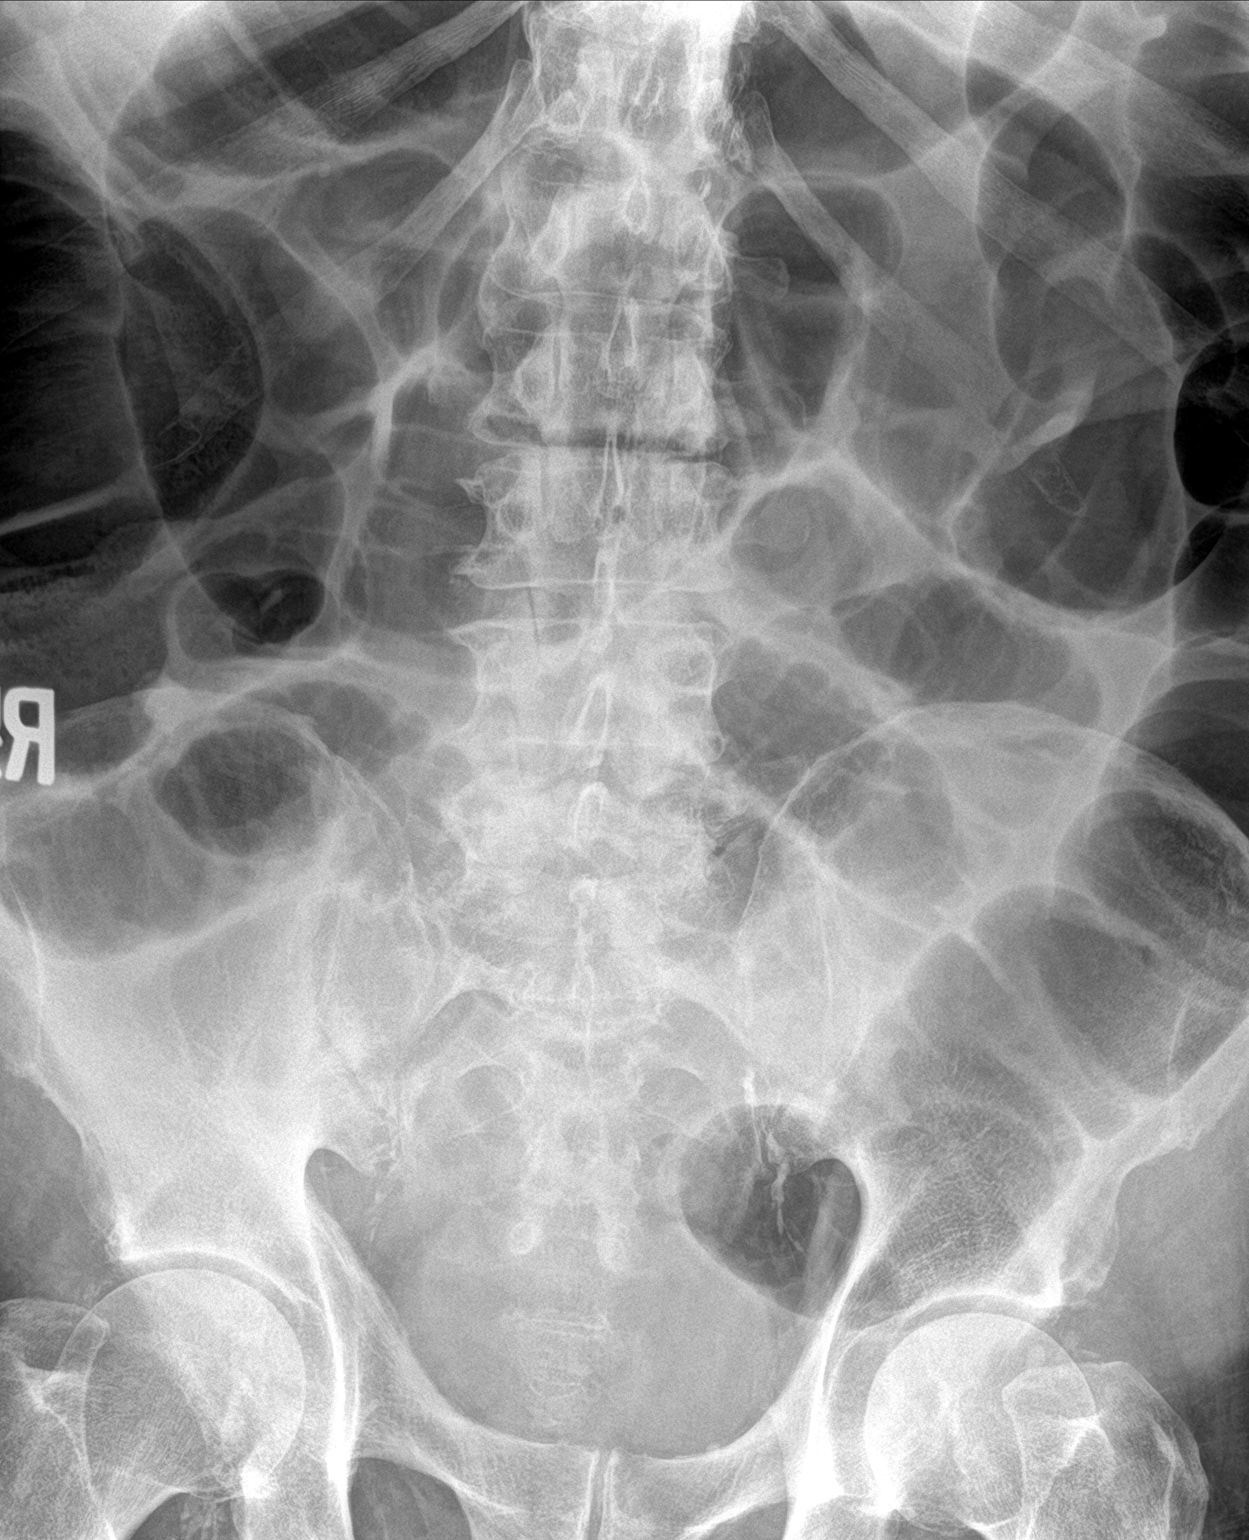

[abdomen kub (2 of 3)]
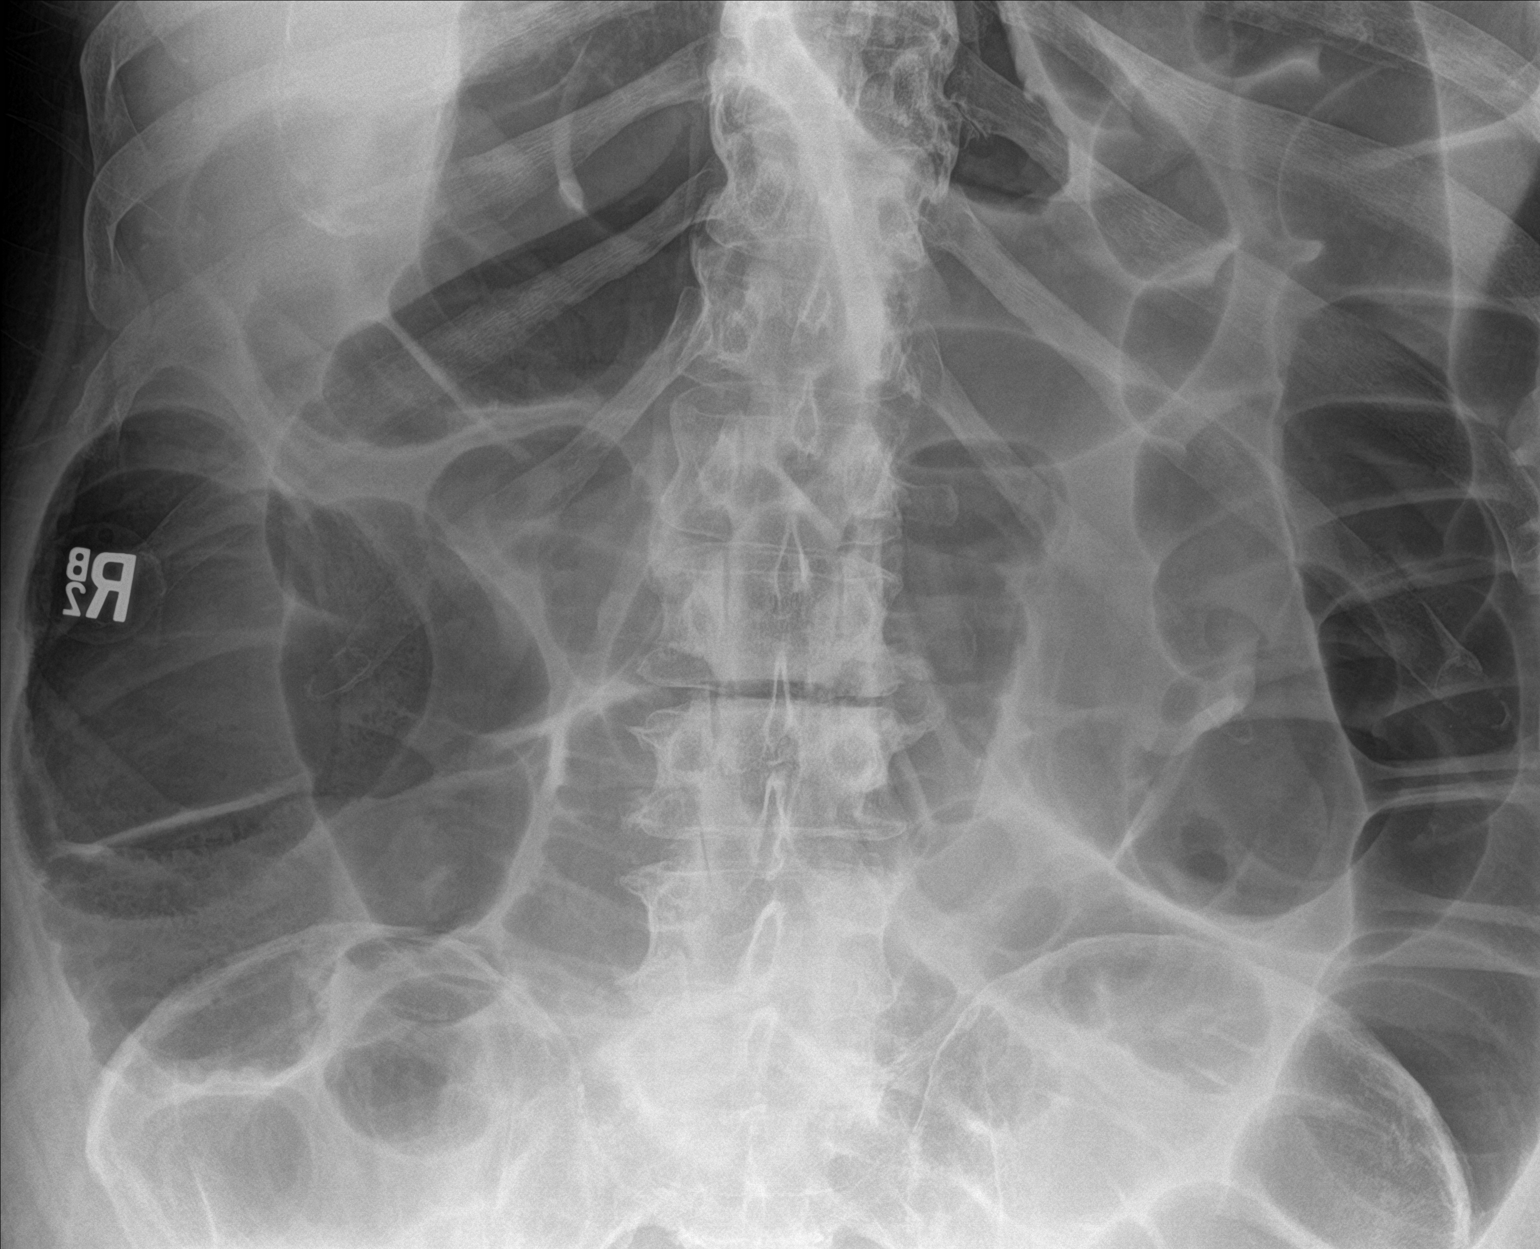

[abdomen kub (3 of 3)]
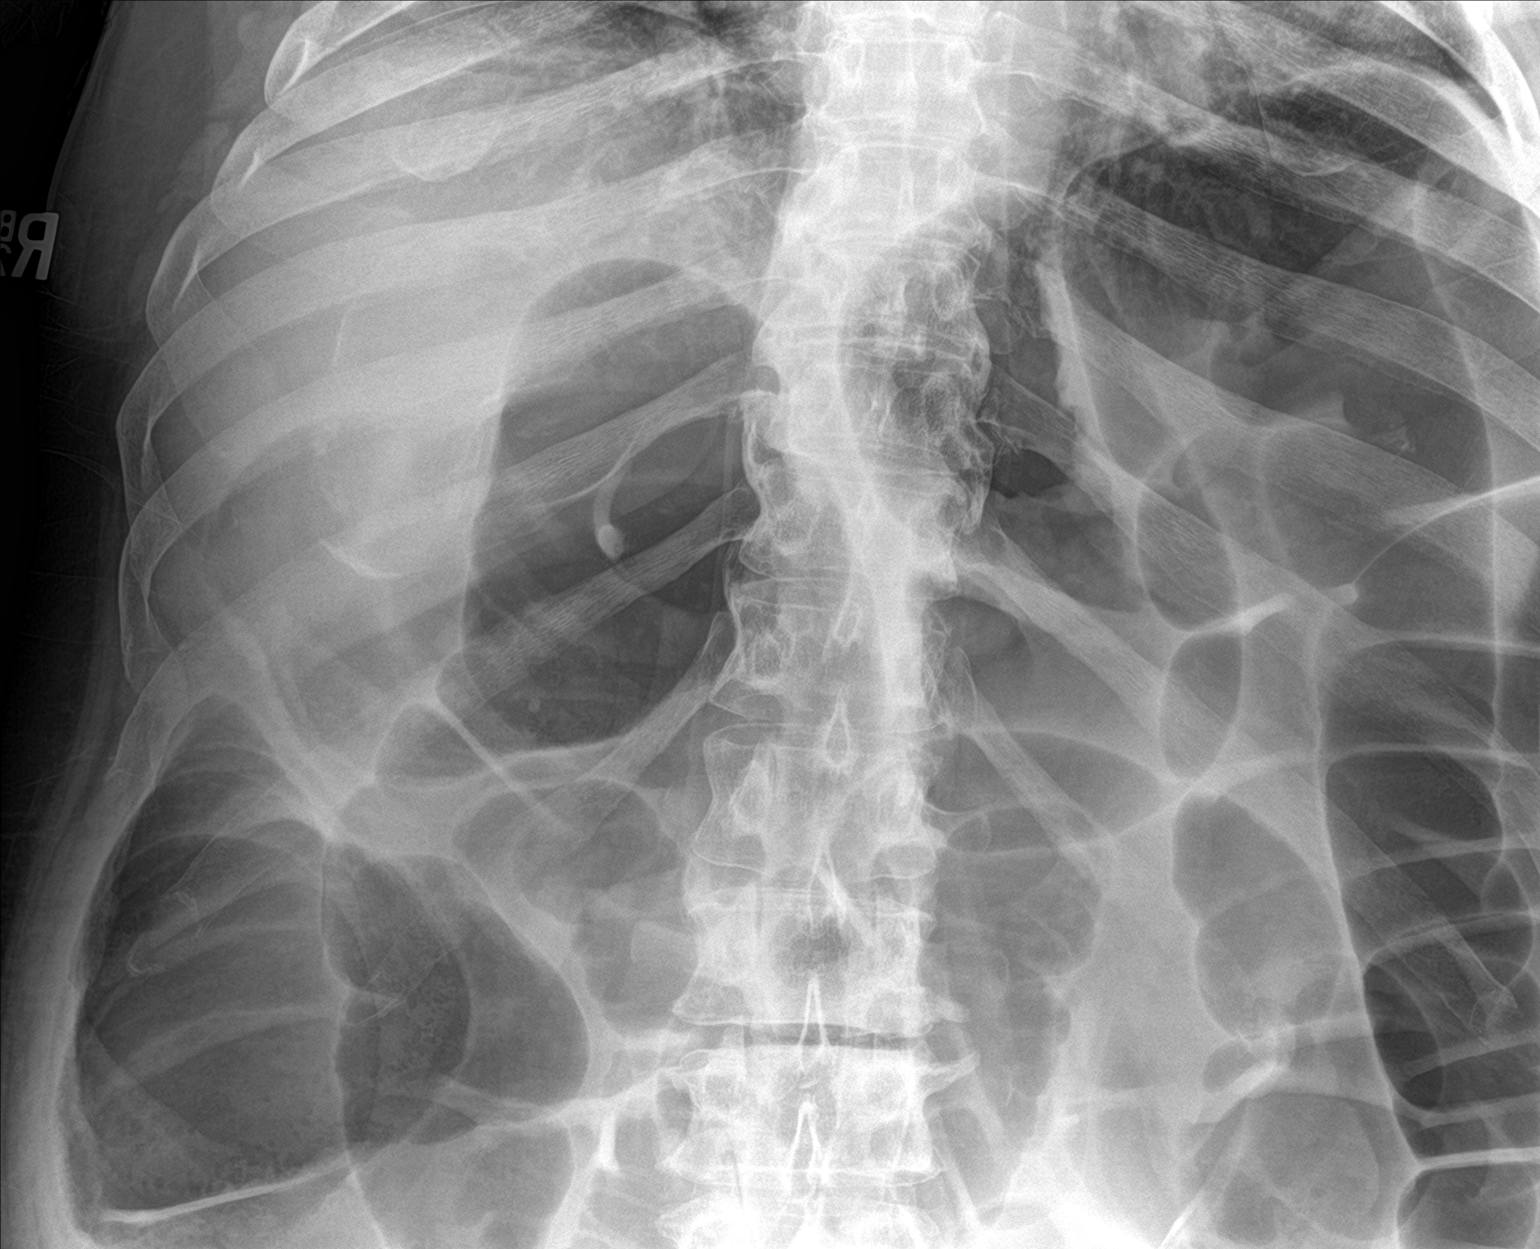

[3 of 3 positions shown; findings below may reference images not displayed]

FINDINGS: Dilated loops of small and large bowel noted. These findings
consistent adynamic ileus. Follow-up exams to demonstrate clearing
in order to exclude bowel obstruction suggested. Rounded calcific
density noted over the right upper quadrant. Porcelain gallbladder
could present in this fashion. Aortoiliac atherosclerotic vascular
calcification. Degenerative change lumbar spine.
IMPRESSION: 1. Distended loops of small and large bowel noted. These findings
are consistent with adynamic ileus. Follow-up exam is suggested to
demonstrate clearing in order to exclude bowel obstruction.

2. Rounded calcific density noted the right upper quadrant.
Porcelain gallbladder could present this fashion.

## 2020-07-27 IMAGING — DX DG ABD PORTABLE 1V
1 series · 1 of 1 positions shown · non-contrast
Comparison: 08/21/2017

CLINICAL DATA: NG tube placement

EXAM:
PORTABLE ABDOMEN - 1 VIEW

[abdomen]
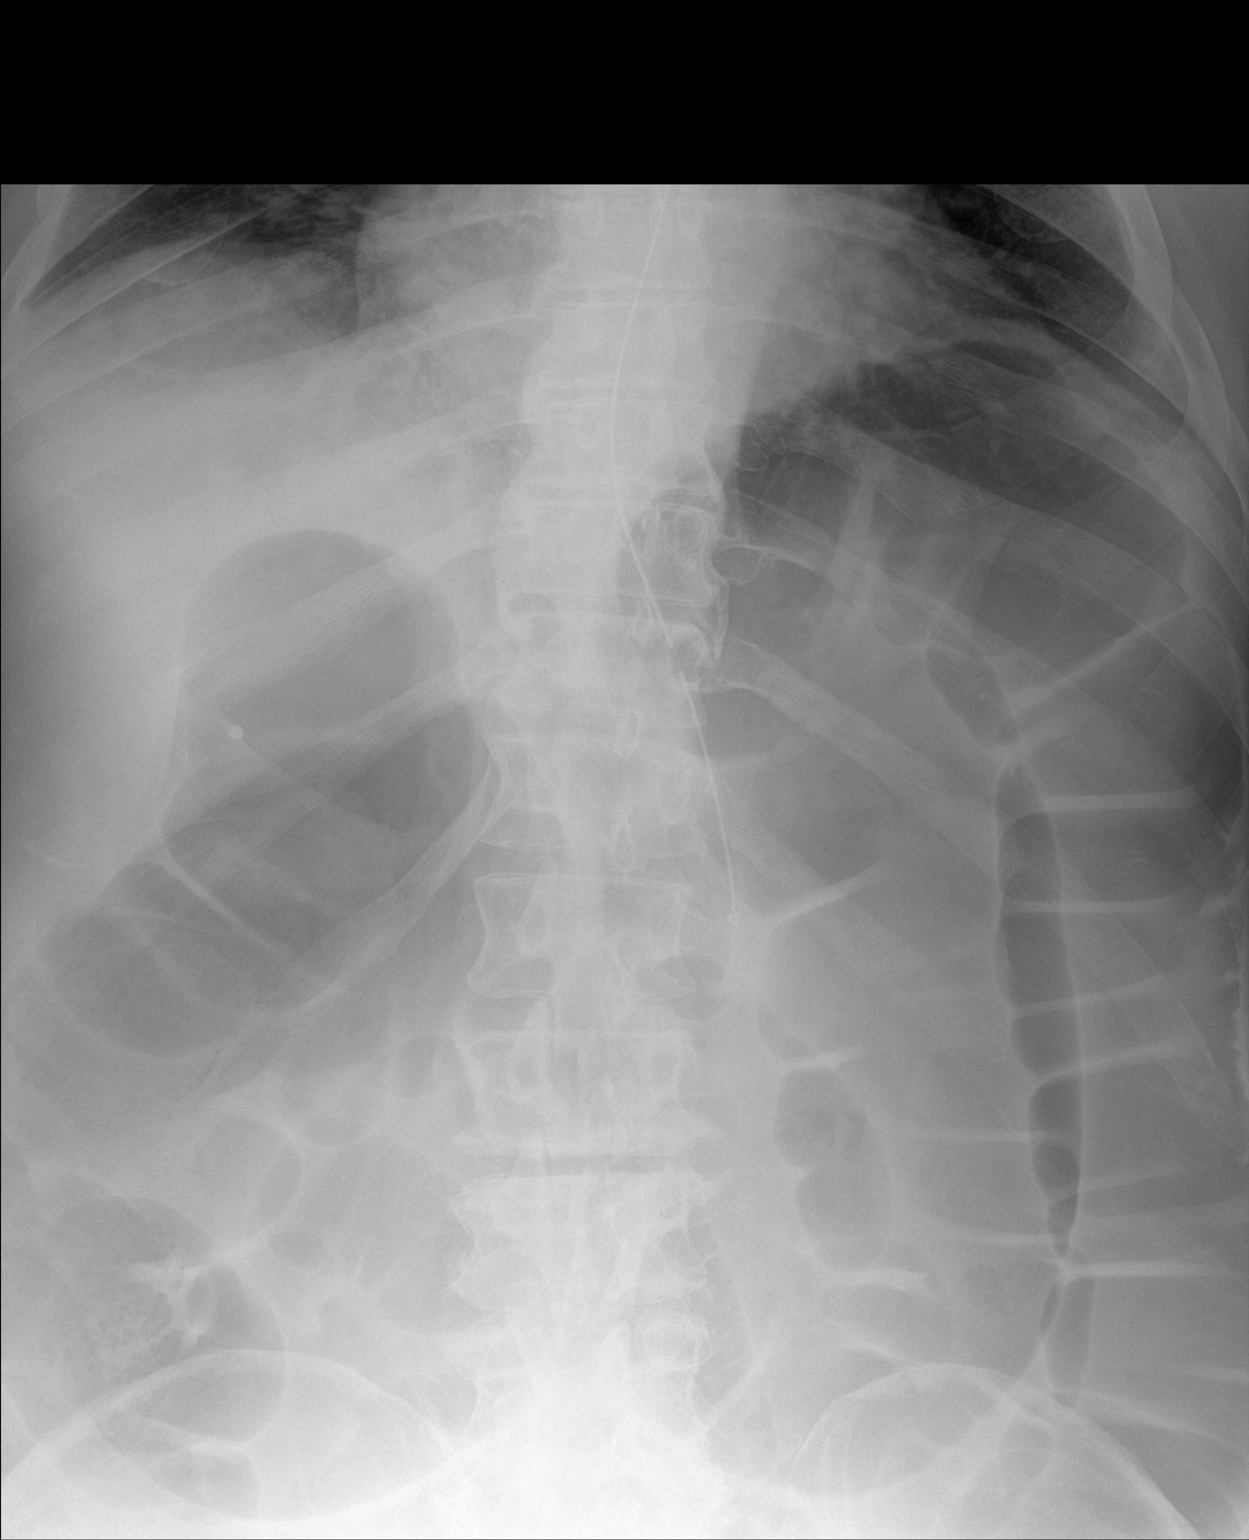

[1 of 1 positions shown; findings below may reference images not displayed]

FINDINGS: NG tube is in the mid stomach.  Stable colonic dilatation.
IMPRESSION: NG tube in the mid stomach.

## 2020-07-27 IMAGING — RF DG ABDOMEN 1V
1 series · 1 of 1 positions shown · non-contrast
Comparison: Earlier today.

CLINICAL DATA: Nasogastric tube placement.

EXAM:
ABDOMEN - 1 VIEW

[Series 3: fluoro_barium 1fps_bw · 0.17mm/px · 1 of 1 slices shown]
[im 1/1]
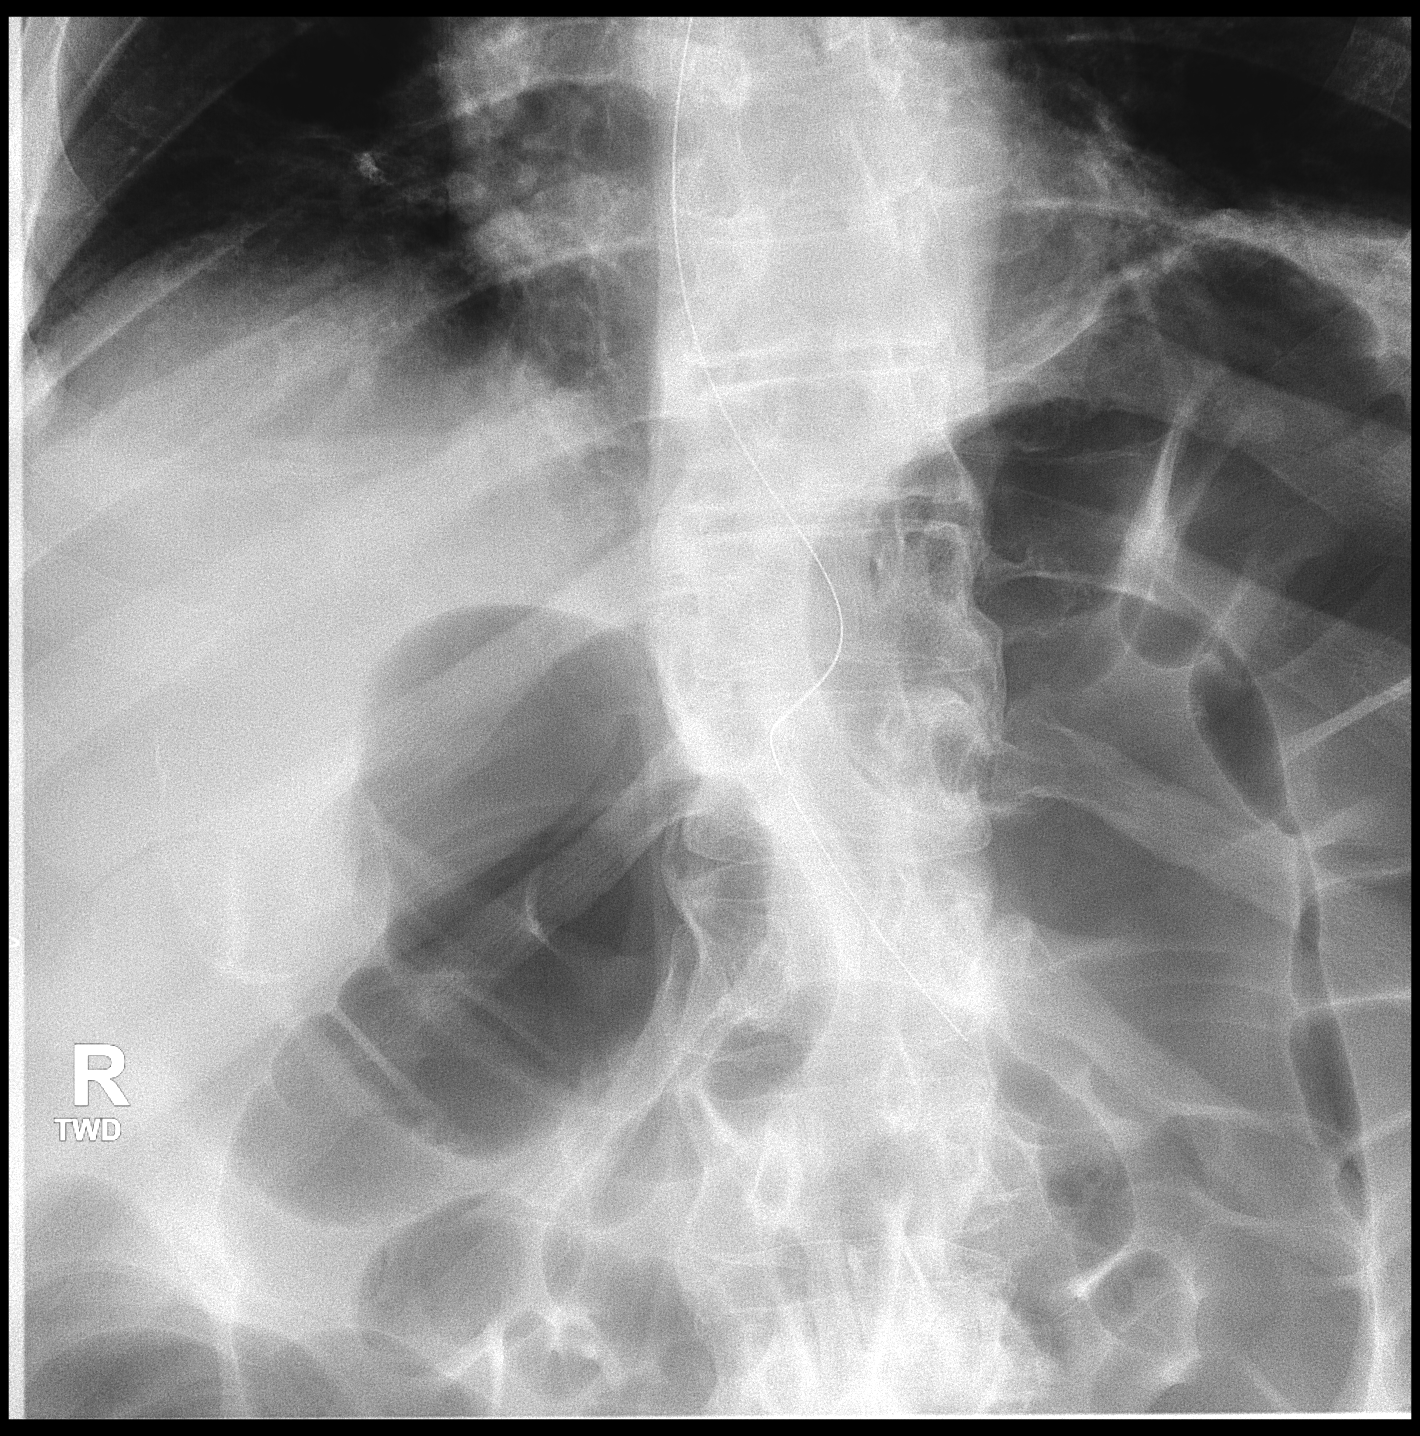

[1 of 1 positions shown; findings below may reference images not displayed]

FINDINGS: Interval nasogastric tube with its tip and side hole in the mid
stomach. Again demonstrated is calcification in the right upper
abdomen with an appearance suggesting gallbladder wall
calcification. Gas distended loops of colon and small bowel are
again demonstrated without significant change. Thoracolumbar spine
degenerative changes.
IMPRESSION: 1. Nasogastric tube tip and side hole in the mid stomach.
2. Stable probable colonic and small bowel ileus. Again, obstruction
is less likely but not excluded.
3. Stable probable porcelain gallbladder. This has an increased risk
of gallbladder cancer.

## 2020-08-18 ENCOUNTER — Ambulatory Visit: Payer: Medicare HMO | Admitting: Podiatry

## 2020-09-08 ENCOUNTER — Other Ambulatory Visit: Payer: Self-pay

## 2020-09-08 ENCOUNTER — Ambulatory Visit: Payer: Medicare HMO | Admitting: Podiatry

## 2020-09-08 DIAGNOSIS — M79674 Pain in right toe(s): Secondary | ICD-10-CM

## 2020-09-08 DIAGNOSIS — M2141 Flat foot [pes planus] (acquired), right foot: Secondary | ICD-10-CM | POA: Diagnosis not present

## 2020-09-08 DIAGNOSIS — M2142 Flat foot [pes planus] (acquired), left foot: Secondary | ICD-10-CM

## 2020-09-08 DIAGNOSIS — M79675 Pain in left toe(s): Secondary | ICD-10-CM | POA: Diagnosis not present

## 2020-09-08 DIAGNOSIS — E1151 Type 2 diabetes mellitus with diabetic peripheral angiopathy without gangrene: Secondary | ICD-10-CM | POA: Diagnosis not present

## 2020-09-08 DIAGNOSIS — R600 Localized edema: Secondary | ICD-10-CM | POA: Diagnosis not present

## 2020-09-08 DIAGNOSIS — B351 Tinea unguium: Secondary | ICD-10-CM

## 2020-09-08 DIAGNOSIS — E119 Type 2 diabetes mellitus without complications: Secondary | ICD-10-CM

## 2020-09-08 NOTE — Progress Notes (Signed)
ANNUAL DIABETIC FOOT EXAM  Subjective: Brandon Collins presents today for for annual diabetic foot examination, at risk foot care. Pt has h/o NIDDM with PAD and painful thick toenails that are difficult to trim. Pain interferes with ambulation. Aggravating factors include wearing enclosed shoe gear. Pain is relieved with periodic professional debridement..  Patient relates 25 year h/o diabetes.  Patient relates h/o foot wound to right ankle.  Wound healed.  Patient relates symptoms of foot numbness.  Patient relates occasional symptoms of foot tingling.  Patient denies symptoms of burning in feet.  Patient's blood sugar was 96 mg/dl this morning.  Patient states last A1c was 6.8%.  [place], Excell Seltzer., MD is patient's PCP. Last visit was about 6 months ago.  States he has no appointment next week.  Past Medical History:  Diagnosis Date  . Arthritis    "all over" (08/12/2018)  . Complication of anesthesia 07/2018   "out of it for 2 weeks."  "Low Blood Sugar."  . High cholesterol   . HOH (hard of hearing)   . Hypertension   . Type II diabetes mellitus (HCC)   . Ulcer of ankle, right, with unspecified severity [facility])    Patient Active Problem List   Diagnosis Date Noted  . Flexion contracture of knee, right 08/18/2019  . PAD (peripheral artery disease) (HCC) 01/27/2019  . Ankle ulcer, right, limited to breakdown of skin (HCC) 10/21/2018  . Primary osteoarthritis of right knee 10/21/2018  . Hyponatremia   . Pain   . Cervical myelopathy (HCC) 08/19/2018  . Cervical cord compression with myelopathy (HCC)   . Postoperative pain   . Diabetes mellitus type 2 in nonobese (HCC)   . Essential hypertension   . Dyslipidemia   . Drug induced constipation   . Myelopathy (HCC) 08/12/2018   Past Surgical History:  Procedure Laterality Date  . ABDOMINAL AORTOGRAM W/LOWER EXTREMITY N/A 01/22/2019   Procedure: ABDOMINAL AORTOGRAM W/LOWER EXTREMITY;  Surgeon: Maeola Harman, MD;   Location: [facility] INVASIVE CV LAB;  Service: Cardiovascular;  Laterality: N/A;  rt leg  . ABDOMINAL AORTOGRAM W/LOWER EXTREMITY N/A 10/26/2019   Procedure: ABDOMINAL AORTOGRAM W/LOWER EXTREMITY;  Surgeon: Maeola Harman, MD;  Location: [facility] INVASIVE CV LAB;  Service: Cardiovascular;  Laterality: N/A;  Rt lower extermity   . ANTERIOR CERVICAL DECOMP/DISCECTOMY FUSION  08/12/2018   CERVICAL 3-4, CERVICAL 4-5, CERVICAL 5-6, CERVICAL 6-7 WITH INSTRUMENTATION AND ALLOGRAFT  . ANTERIOR CERVICAL DECOMPRESSION/DISCECTOMY FUSION 4 LEVELS N/A 08/12/2018   Procedure: ANTERIOR CERVICAL DECOMPRESSION FUSION CERVICAL 3-4, CERVICAL 4-5, CERVICAL 5-6, CERVICAL 6-7 WITH INSTRUMENTATION AND ALLOGRAFT;  Surgeon: Estill Bamberg, MD;  Location: [facility] OR;  Service: Orthopedics;  Laterality: N/A;  ANTERIOR CERVICAL DECOMPRESSION FUSION CERVICAL 3-4, CERVICAL 4-5, CERVICAL 5-6, CERVICAL 6-7 WITH INSTRUMENTATION AND ALLOGRAFT  . COLONOSCOPY W/ POLYPECTOMY    . FEMORAL-POPLITEAL BYPASS GRAFT Right 01/27/2019   Procedure: right FEMORAL ENDARTERECTOMY, RIGHT  FEMORAL- BELOW THE KNEE POPLITEAL ARTERY BYPASS GRAFT using 52mm propaten ringed gore graft, attempted right femoral to below knee popliteal artery bypass using right nonreversed great saphenous vein;  Surgeon: Maeola Harman, MD;  Location: [facility] OR;  Service: Vascular;  Laterality: Right;  . INGUINAL HERNIA REPAIR  1958   "? side"  . KNEE ARTHROSCOPY Right   . PERIPHERAL VASCULAR ATHERECTOMY  10/26/2019   Procedure: PERIPHERAL VASCULAR ATHERECTOMY;  Surgeon: Maeola Harman, MD;  Location: [facility] INVASIVE CV LAB;  Service: Cardiovascular;;  LASER  . PILONIDAL CYST EXCISION     Current  Outpatient Medications on File Prior to Visit  Medication Sig Dispense Refill  . amLODipine (NORVASC) 10 MG tablet Take 10 mg by mouth daily.    Marland Kitchen aspirin EC 81 MG tablet Take 81 mg by mouth daily.    Marland Kitchen atorvastatin (LIPITOR) 20 MG tablet Take 20 mg by mouth 4 (four) times a  week. Evern Bio, Thur, and Sat  1  . cholecalciferol (VITAMIN D) 1000 units tablet Take 1 tablet (1,000 Units total) by mouth daily.    . clopidogrel (PLAVIX) 75 MG tablet Take 1 tablet (75 mg total) by mouth daily. 30 tablet 11  . feeding supplement, ENSURE ENLIVE, (ENSURE ENLIVE) LIQD Take 237 mLs by mouth 2 (two) times daily between meals. 237 mL 12  . glimepiride (AMARYL) 4 MG tablet Take 1 tablet (4 mg total) by mouth every evening.    . hydrochlorothiazide (HYDRODIURIL) 25 MG tablet Take 25 mg by mouth daily.    Marland Kitchen lisinopril (ZESTRIL) 20 MG tablet Take 20 mg by mouth daily.    Marland Kitchen lisinopril (ZESTRIL) 40 MG tablet Take 40 mg by mouth daily.    Letta Pate ULTRA test strip USE TO TEST FASTING SUGAR ONCE DAILY IN THE MORNING    . pioglitazone (ACTOS) 15 MG tablet Take 15 mg by mouth daily.     No current facility-administered medications on file prior to visit.    No Known Allergies Social History   Occupational History  . Not on file  Tobacco Use  . Smoking status: Former Smoker    Packs/day: 1.00    Years: 30.00    Pack years: 30.00    Types: Cigarettes  . Smokeless tobacco: Never Used  . Tobacco comment: quit in late 1990's  Vaping Use  . Vaping Use: Never used  Substance and Sexual Activity  . Alcohol use: Not Currently    Comment: 08/12/2018 "nothing in the 2000s"  . Drug use: Never  . Sexual activity: Not Currently   Family History  Problem Relation Age of Onset  . Cancer Mother   . Stroke Father        multiple   Immunization History  Administered Date(s) Administered  . PFIZER SARS-COV-2 Vaccination 11/16/2019, 12/15/2019  . Pneumococcal Conjugate-13 08/31/2017  . Pneumococcal Polysaccharide-23 08/23/2018     Review of Systems: Negative except as noted in the HPI.  Objective: There were no vitals filed for this visit.  GERARD CANTARA is a pleasant 69 y.o. male in NAD. AAO X 3.  Vascular Examination: Capillary refill time to digits <4 seconds b/l lower  extremities. Nonpalpable DP pulse(s) b/l lower extremities. Nonpalpable PT pulse(s) b/l lower extremities. Pedal hair absent. Lower extremity skin temperature gradient within normal limits. No pain with calf compression b/l. +2 pitting edema b/l lower extremities. No ischemia or gangrene noted b/l lower extremities.  Dermatological Examination: Pedal skin with normal turgor, texture and tone bilaterally. No open wounds bilaterally. No interdigital macerations bilaterally. Toenails 1-5 b/l elongated, discolored, dystrophic, thickened, crumbly with subungual debris and tenderness to dorsal palpation.  Musculoskeletal Examination: Normal muscle strength 5/5 to all lower extremity muscle groups bilaterally. No pain crepitus or joint limitation noted with ROM b/l. Pes planus deformity noted b/l.   Footwear Assessment: Does the patient wear appropriate shoes? Yes. Does the patient need inserts/orthotics? Yes.  Neurological Examination: Protective sensation intact 5/5 intact bilaterally with 10g monofilament b/l. Vibratory sensation diminished b/l.   Assessment: 1. Pain due to onychomycosis of toenails of both feet   2.  Lower extremity edema   3. Type II diabetes mellitus with peripheral circulatory disorder (HCC)   4. Pes planus of both feet   5. Encounter for diabetic foot exam (HCC)     ADA Risk Categorization:  High Risk  Patient has one or more of the following: Loss of protective sensation Absent pedal pulses Severe Foot deformity History of foot ulcer  Plan: -Examined patient. -Diabetic foot examination performed on today's visit. -Patient to continue soft, supportive shoe gear daily. -Toenails 1-5 b/l were debrided in length and girth with sterile nail nippers and dremel without iatrogenic bleeding.  -Pinpoint bleeding occurred during debridement of  R 3rd toe and R 5th toe. Treated with Lumicain Hemostatic Solution and alcohol. No further treatment required by patient. -Patient  to report any pedal injuries to medical professional immediately. -Patient/POA to call should there be question/concern in the interim.  Return in about 3 months (around 12/07/2020) for diabetic foot care.  Freddie Breech, DPM

## 2020-09-16 ENCOUNTER — Encounter: Payer: Self-pay | Admitting: Podiatry

## 2020-09-21 ENCOUNTER — Ambulatory Visit (HOSPITAL_COMMUNITY)
Admission: RE | Admit: 2020-09-21 | Discharge: 2020-09-21 | Disposition: A | Discharge status: Home / Self Care | Payer: Medicare HMO | Source: Ambulatory Visit | Attending: Physician Assistant | Admitting: Physician Assistant

## 2020-09-21 ENCOUNTER — Other Ambulatory Visit: Payer: Self-pay

## 2020-09-21 ENCOUNTER — Ambulatory Visit: Payer: Medicare HMO | Admitting: Physician Assistant

## 2020-09-21 ENCOUNTER — Ambulatory Visit (INDEPENDENT_AMBULATORY_CARE_PROVIDER_SITE_OTHER)
Admission: RE | Admit: 2020-09-21 | Discharge: 2020-09-21 | Disposition: A | Discharge status: Home / Self Care | Payer: Medicare HMO | Source: Ambulatory Visit | Attending: Physician Assistant | Admitting: Physician Assistant

## 2020-09-21 VITALS — BP 141/67 | HR 70 | Temp 98.6°F | Resp 20 | Ht 72.0 in | Wt 256.0 lb

## 2020-09-21 DIAGNOSIS — I739 Peripheral vascular disease, unspecified: Secondary | ICD-10-CM | POA: Insufficient documentation

## 2020-09-21 MED ORDER — CEPHALEXIN 500 MG PO CAPS: 500.0000 mg | ORAL_CAPSULE | Freq: Two times a day (BID) | ORAL | 0 refills | Status: AC

## 2020-09-21 NOTE — Progress Notes (Signed)
Office Note     CC:  follow up Requesting Provider:  Nonnie Done., MD  HPI: Brandon Collins is a 70 y.o. ([birth year]) male who presents for follow up of peripheral artery disease. He had a right external iliac artery, common femoral artery and profunda femoral artery endarterectomy with vein patch angioplasty, harvest of right greater saphenous vein and right common femoral to right below knee popliteal artery bypass with PTFE on 01/27/19 by Dr. Randie Heinz for critical right lower extremity ischemia with wound. He did well after surgery and improved chronic RLE wound. At the time of his follow up his duplex was noted to have several areas of stenosis so he had laser atherectomy and angioplasty of the right CFA and in-graft stenosis was performed on 10/26/2019 by Dr. Randie Heinz.  He is here today for his 6 months follow up and non invasive studies. He has chronic right lower extremity edema greater than left. He says this does improve with elevation. He just started having increased swelling and redness of the right lower leg over past several days.  He denies pain at rest or on ambulation. He does have cramping in his legs but only at night time. He denies any pain in his feet. He denies any numbness or coldness. He says he has been ambulating more and is able to get around at home using just a cane. If he is going any far distances he uses a rolling walker  The pt is on a statin for cholesterol management.  The pt is on a daily aspirin.   Other AC: Plavix The pt on CCB, ACE, HCTZ for hypertension.   The pt is diabetic.  Tobacco hx:  Former smoker, 1990's  Past Medical History:  Diagnosis Date  . Arthritis    "all over" (08/12/2018)  . Complication of anesthesia 07/2018   "out of it for 2 weeks."  "Low Blood Sugar."  . High cholesterol   . HOH (hard of hearing)   . Hypertension   . Type II diabetes mellitus (HCC)   . Ulcer of ankle, right, with unspecified severity [facility])     Past Surgical History:   Procedure Laterality Date  . ABDOMINAL AORTOGRAM W/LOWER EXTREMITY N/A 01/22/2019   Procedure: ABDOMINAL AORTOGRAM W/LOWER EXTREMITY;  Surgeon: Maeola Harman, MD;  Location: [facility] INVASIVE CV LAB;  Service: Cardiovascular;  Laterality: N/A;  rt leg  . ABDOMINAL AORTOGRAM W/LOWER EXTREMITY N/A 10/26/2019   Procedure: ABDOMINAL AORTOGRAM W/LOWER EXTREMITY;  Surgeon: Maeola Harman, MD;  Location: [facility] INVASIVE CV LAB;  Service: Cardiovascular;  Laterality: N/A;  Rt lower extermity   . ANTERIOR CERVICAL DECOMP/DISCECTOMY FUSION  08/12/2018   CERVICAL 3-4, CERVICAL 4-5, CERVICAL 5-6, CERVICAL 6-7 WITH INSTRUMENTATION AND ALLOGRAFT  . ANTERIOR CERVICAL DECOMPRESSION/DISCECTOMY FUSION 4 LEVELS N/A 08/12/2018   Procedure: ANTERIOR CERVICAL DECOMPRESSION FUSION CERVICAL 3-4, CERVICAL 4-5, CERVICAL 5-6, CERVICAL 6-7 WITH INSTRUMENTATION AND ALLOGRAFT;  Surgeon: Estill Bamberg, MD;  Location: [facility] OR;  Service: Orthopedics;  Laterality: N/A;  ANTERIOR CERVICAL DECOMPRESSION FUSION CERVICAL 3-4, CERVICAL 4-5, CERVICAL 5-6, CERVICAL 6-7 WITH INSTRUMENTATION AND ALLOGRAFT  . COLONOSCOPY W/ POLYPECTOMY    . FEMORAL-POPLITEAL BYPASS GRAFT Right 01/27/2019   Procedure: right FEMORAL ENDARTERECTOMY, RIGHT  FEMORAL- BELOW THE KNEE POPLITEAL ARTERY BYPASS GRAFT using 47mm propaten ringed gore graft, attempted right femoral to below knee popliteal artery bypass using right nonreversed great saphenous vein;  Surgeon: Maeola Harman, MD;  Location: [facility] OR;  Service: Vascular;  Laterality: Right;  .  [place]   "? side"  . KNEE ARTHROSCOPY Right   . PERIPHERAL VASCULAR ATHERECTOMY  10/26/2019   Procedure: PERIPHERAL VASCULAR ATHERECTOMY;  Surgeon: Waynetta Sandy, MD;  Location: [place] CV LAB;  Service: Cardiovascular;;  LASER  . PILONIDAL CYST EXCISION      Social History   Socioeconomic History  . Marital status: Married    Spouse name: Not on file  . Number  of children: Not on file  . Years of education: Not on file  . Highest education level: Not on file  Occupational History  . Not on file  Tobacco Use  . Smoking status: Former Smoker    Packs/day: 1.00    Years: 30.00    Pack years: 30.00    Types: Cigarettes  . Smokeless tobacco: Never Used  . Tobacco comment: quit in late 1990's  Vaping Use  . Vaping Use: Never used  Substance and Sexual Activity  . Alcohol use: Not Currently    Comment: 08/12/2018 "nothing in the 2000s"  . Drug use: Never  . Sexual activity: Not Currently  Other Topics Concern  . Not on file  Social History Narrative  . Not on file   Social Determinants of Health   Financial Resource Strain: Not on file  Food Insecurity: Not on file  Transportation Needs: Not on file  Physical Activity: Not on file  Stress: Not on file  Social Connections: Not on file  Intimate Partner Violence: Not on file    Family History  Problem Relation Age of Onset  . Cancer Mother   . Stroke Father        multiple    Current Outpatient Medications  Medication Sig Dispense Refill  . amLODipine (NORVASC) 10 MG tablet Take 10 mg by mouth daily.    Marland Kitchen aspirin EC 81 MG tablet Take 81 mg by mouth daily.    Marland Kitchen atorvastatin (LIPITOR) 20 MG tablet Take 20 mg by mouth 4 (four) times a week. [name], Thur, and Sat  1  . cholecalciferol (VITAMIN D) 1000 units tablet Take 1 tablet (1,000 Units total) by mouth daily.    . clopidogrel (PLAVIX) 75 MG tablet Take 1 tablet (75 mg total) by mouth daily. 30 tablet 11  . feeding supplement, ENSURE ENLIVE, (ENSURE ENLIVE) LIQD Take 237 mLs by mouth 2 (two) times daily between meals. 237 mL 12  . glimepiride (AMARYL) 4 MG tablet Take 1 tablet (4 mg total) by mouth every evening.    . hydrochlorothiazide (HYDRODIURIL) 25 MG tablet Take 25 mg by mouth daily.    Marland Kitchen lisinopril (ZESTRIL) 40 MG tablet Take 40 mg by mouth daily.    [name] ULTRA test strip USE TO TEST FASTING SUGAR ONCE DAILY IN  THE MORNING    . pioglitazone (ACTOS) 15 MG tablet Take 15 mg by mouth daily.     No current facility-administered medications for this visit.    No Known Allergies   REVIEW OF SYSTEMS:  [X]  denotes positive finding, [ ]  denotes negative finding Cardiac  Comments:  Chest pain or chest pressure:    Shortness of breath upon exertion:    Short of breath when lying flat:    Irregular heart rhythm:        Vascular    Pain in calf, thigh, or hip brought on by ambulation:    Pain in feet at night that wakes you up from your sleep:     Blood clot  in your veins:    Leg swelling:  X       Pulmonary    Oxygen at home:    Productive cough:     Wheezing:         Neurologic    Sudden weakness in arms or legs:     Sudden numbness in arms or legs:     Sudden onset of difficulty speaking or slurred speech:    Temporary loss of vision in one eye:     Problems with dizziness:         Gastrointestinal    Blood in stool:     Vomited blood:         Genitourinary    Burning when urinating:     Blood in urine:        Psychiatric    Major depression:         Hematologic    Bleeding problems:    Problems with blood clotting too easily:        Skin    Rashes or ulcers:        Constitutional    Fever or chills:      PHYSICAL EXAMINATION:  Vitals:   09/21/20 1049  BP: (!) 141/67  Pulse: 70  Resp: 20  Temp: 98.6 F (37 C)  SpO2: 99%  Weight: 256 lb (116.1 kg)  Height: 6' (1.829 m)    General:  WDWN in NAD; vital signs documented above Gait: Normal, uses rolling walker HENT: WNL, normocephalic Pulmonary: normal non-labored breathing , without wheezing Cardiac: regular HR, without  Murmurs without carotid bruit Abdomen:obese, soft, NT, no masses Vascular Exam/Pulses:  Right Left  Radial 2+ (normal) 2+ (normal)  Femoral 2+ (normal) 2+ (normal)  Popliteal Not palpable Not palpable  DP Not palpable Not palpable  PT Not palpable Not palpable   Extremities: without  ischemic changes, without Gangrene , with cellulitis of the right leg, without open wounds; 2+ pitting edema of the right lower extremity, 1+ left lower extremity. Bilateral feet warm and well perfused. Motor and sensation intact Musculoskeletal: no muscle wasting or atrophy  Neurologic: A&O X 3;  No focal weakness or paresthesias are detected Psychiatric:  The pt has Normal affect.   Non-Invasive Vascular Imaging:   +-------+-----------+-----------+------------+------------+  ABI/TBIToday's ABIToday's TBIPrevious ABIPrevious TBI  +-------+-----------+-----------+------------+------------+  Right 0.92    0.33    [place]     0.55      +-------+-----------+-----------+------------+------------+  Left  0.51    0.26    0.66    0.64      +-------+-----------+-----------+------------+------------+  Monophasic flow bilaterally. Right toe pressure of 46 (was 85). Left toe pressure of 36 (was 99)  Duplex of right lower extremity bypass graft shows >70% stenosis at the proximal anastomosis with velocity of 562 PSV cm/s. Monophasic flow throughout the graft and diminished velocities in the distal graft  ASSESSMENT/PLAN:: 70 y.o. male here for follow up for peripheral artery disease. He currently is without any symptoms. He does have chronic lower extremity edema. He presents today with RLE cellulitis. I have sent Antibiotics- Keflex 500 mg BID x 10 days to his pharmacy. His ABIs have decreased bilaterally from prior study and his toe pressures bilaterally dropped significantly. His arterial duplex of his RLE bypass graft shows recurrent proximal anastomosis stenosis with monophasic flow and diminished distal velocities in the graft. He is asymptomatic but I am concerned about graft thrombosis/ failure if this is not treated. I have recommended Angiogram with possible  intervention - I have encouraged him to elevate his legs to help with edema  - I have also  encouraged him to continue to ambulate as much as he can - I have scheduled him to have an aortogram, arteriogram with possible intervention of RLE with Dr. Randie Heinz. This will be arranged next earliest availability over next couple weeks   Graceann Congress, [name] [facility] [phone]  Clinic MD:  Dr. Darrick Penna

## 2020-09-21 NOTE — H&P (View-Only) (Signed)
Office Note     CC:  follow up Requesting Provider:  Nonnie Done., MD  HPI: Brandon Collins is a 70 y.o. ([birth year]) male who presents for follow up of peripheral artery disease. He had a right external iliac artery, common femoral artery and profunda femoral artery endarterectomy with vein patch angioplasty, harvest of right greater saphenous vein and right common femoral to right below knee popliteal artery bypass with PTFE on 01/27/19 by Dr. Randie Heinz for critical right lower extremity ischemia with wound. He did well after surgery and improved chronic RLE wound. At the time of his follow up his duplex was noted to have several areas of stenosis so he had laser atherectomy and angioplasty of the right CFA and in-graft stenosis was performed on 10/26/2019 by Dr. Randie Heinz.  He is here today for his 6 months follow up and non invasive studies. He has chronic right lower extremity edema greater than left. He says this does improve with elevation. He just started having increased swelling and redness of the right lower leg over past several days.  He denies pain at rest or on ambulation. He does have cramping in his legs but only at night time. He denies any pain in his feet. He denies any numbness or coldness. He says he has been ambulating more and is able to get around at home using just a cane. If he is going any far distances he uses a rolling walker  The pt is on a statin for cholesterol management.  The pt is on a daily aspirin.   Other AC: Plavix The pt on CCB, ACE, HCTZ for hypertension.   The pt is diabetic.  Tobacco hx:  Former smoker, 1990's  Past Medical History:  Diagnosis Date  . Arthritis    "all over" (08/12/2018)  . Complication of anesthesia 07/2018   "out of it for 2 weeks."  "Low Blood Sugar."  . High cholesterol   . HOH (hard of hearing)   . Hypertension   . Type II diabetes mellitus (HCC)   . Ulcer of ankle, right, with unspecified severity [facility])     Past Surgical History:   Procedure Laterality Date  . ABDOMINAL AORTOGRAM W/LOWER EXTREMITY N/A 01/22/2019   Procedure: ABDOMINAL AORTOGRAM W/LOWER EXTREMITY;  Surgeon: Maeola Harman, MD;  Location: [facility] INVASIVE CV LAB;  Service: Cardiovascular;  Laterality: N/A;  rt leg  . ABDOMINAL AORTOGRAM W/LOWER EXTREMITY N/A 10/26/2019   Procedure: ABDOMINAL AORTOGRAM W/LOWER EXTREMITY;  Surgeon: Maeola Harman, MD;  Location: [facility] INVASIVE CV LAB;  Service: Cardiovascular;  Laterality: N/A;  Rt lower extermity   . ANTERIOR CERVICAL DECOMP/DISCECTOMY FUSION  08/12/2018   CERVICAL 3-4, CERVICAL 4-5, CERVICAL 5-6, CERVICAL 6-7 WITH INSTRUMENTATION AND ALLOGRAFT  . ANTERIOR CERVICAL DECOMPRESSION/DISCECTOMY FUSION 4 LEVELS N/A 08/12/2018   Procedure: ANTERIOR CERVICAL DECOMPRESSION FUSION CERVICAL 3-4, CERVICAL 4-5, CERVICAL 5-6, CERVICAL 6-7 WITH INSTRUMENTATION AND ALLOGRAFT;  Surgeon: Estill Bamberg, MD;  Location: [facility] OR;  Service: Orthopedics;  Laterality: N/A;  ANTERIOR CERVICAL DECOMPRESSION FUSION CERVICAL 3-4, CERVICAL 4-5, CERVICAL 5-6, CERVICAL 6-7 WITH INSTRUMENTATION AND ALLOGRAFT  . COLONOSCOPY W/ POLYPECTOMY    . FEMORAL-POPLITEAL BYPASS GRAFT Right 01/27/2019   Procedure: right FEMORAL ENDARTERECTOMY, RIGHT  FEMORAL- BELOW THE KNEE POPLITEAL ARTERY BYPASS GRAFT using 47mm propaten ringed gore graft, attempted right femoral to below knee popliteal artery bypass using right nonreversed great saphenous vein;  Surgeon: Maeola Harman, MD;  Location: [facility] OR;  Service: Vascular;  Laterality: Right;  .  [place]   "? side"  . KNEE ARTHROSCOPY Right   . PERIPHERAL VASCULAR ATHERECTOMY  10/26/2019   Procedure: PERIPHERAL VASCULAR ATHERECTOMY;  Surgeon: Waynetta Sandy, MD;  Location: [place] CV LAB;  Service: Cardiovascular;;  LASER  . PILONIDAL CYST EXCISION      Social History   Socioeconomic History  . Marital status: Married    Spouse name: Not on file  . Number  of children: Not on file  . Years of education: Not on file  . Highest education level: Not on file  Occupational History  . Not on file  Tobacco Use  . Smoking status: Former Smoker    Packs/day: 1.00    Years: 30.00    Pack years: 30.00    Types: Cigarettes  . Smokeless tobacco: Never Used  . Tobacco comment: quit in late 1990's  Vaping Use  . Vaping Use: Never used  Substance and Sexual Activity  . Alcohol use: Not Currently    Comment: 08/12/2018 "nothing in the 2000s"  . Drug use: Never  . Sexual activity: Not Currently  Other Topics Concern  . Not on file  Social History Narrative  . Not on file   Social Determinants of Health   Financial Resource Strain: Not on file  Food Insecurity: Not on file  Transportation Needs: Not on file  Physical Activity: Not on file  Stress: Not on file  Social Connections: Not on file  Intimate Partner Violence: Not on file    Family History  Problem Relation Age of Onset  . Cancer Mother   . Stroke Father        multiple    Current Outpatient Medications  Medication Sig Dispense Refill  . amLODipine (NORVASC) 10 MG tablet Take 10 mg by mouth daily.    Marland Kitchen aspirin EC 81 MG tablet Take 81 mg by mouth daily.    Marland Kitchen atorvastatin (LIPITOR) 20 MG tablet Take 20 mg by mouth 4 (four) times a week. [name], Thur, and Sat  1  . cholecalciferol (VITAMIN D) 1000 units tablet Take 1 tablet (1,000 Units total) by mouth daily.    . clopidogrel (PLAVIX) 75 MG tablet Take 1 tablet (75 mg total) by mouth daily. 30 tablet 11  . feeding supplement, ENSURE ENLIVE, (ENSURE ENLIVE) LIQD Take 237 mLs by mouth 2 (two) times daily between meals. 237 mL 12  . glimepiride (AMARYL) 4 MG tablet Take 1 tablet (4 mg total) by mouth every evening.    . hydrochlorothiazide (HYDRODIURIL) 25 MG tablet Take 25 mg by mouth daily.    Marland Kitchen lisinopril (ZESTRIL) 40 MG tablet Take 40 mg by mouth daily.    [name] ULTRA test strip USE TO TEST FASTING SUGAR ONCE DAILY IN  THE MORNING    . pioglitazone (ACTOS) 15 MG tablet Take 15 mg by mouth daily.     No current facility-administered medications for this visit.    No Known Allergies   REVIEW OF SYSTEMS:  [X]  denotes positive finding, [ ]  denotes negative finding Cardiac  Comments:  Chest pain or chest pressure:    Shortness of breath upon exertion:    Short of breath when lying flat:    Irregular heart rhythm:        Vascular    Pain in calf, thigh, or hip brought on by ambulation:    Pain in feet at night that wakes you up from your sleep:     Blood clot  in your veins:    Leg swelling:  X       Pulmonary    Oxygen at home:    Productive cough:     Wheezing:         Neurologic    Sudden weakness in arms or legs:     Sudden numbness in arms or legs:     Sudden onset of difficulty speaking or slurred speech:    Temporary loss of vision in one eye:     Problems with dizziness:         Gastrointestinal    Blood in stool:     Vomited blood:         Genitourinary    Burning when urinating:     Blood in urine:        Psychiatric    Major depression:         Hematologic    Bleeding problems:    Problems with blood clotting too easily:        Skin    Rashes or ulcers:        Constitutional    Fever or chills:      PHYSICAL EXAMINATION:  Vitals:   09/21/20 1049  BP: (!) 141/67  Pulse: 70  Resp: 20  Temp: 98.6 F (37 C)  SpO2: 99%  Weight: 256 lb (116.1 kg)  Height: 6' (1.829 m)    General:  WDWN in NAD; vital signs documented above Gait: Normal, uses rolling walker HENT: WNL, normocephalic Pulmonary: normal non-labored breathing , without wheezing Cardiac: regular HR, without  Murmurs without carotid bruit Abdomen:obese, soft, NT, no masses Vascular Exam/Pulses:  Right Left  Radial 2+ (normal) 2+ (normal)  Femoral 2+ (normal) 2+ (normal)  Popliteal Not palpable Not palpable  DP Not palpable Not palpable  PT Not palpable Not palpable   Extremities: without  ischemic changes, without Gangrene , with cellulitis of the right leg, without open wounds; 2+ pitting edema of the right lower extremity, 1+ left lower extremity. Bilateral feet warm and well perfused. Motor and sensation intact Musculoskeletal: no muscle wasting or atrophy  Neurologic: A&O X 3;  No focal weakness or paresthesias are detected Psychiatric:  The pt has Normal affect.   Non-Invasive Vascular Imaging:   +-------+-----------+-----------+------------+------------+  ABI/TBIToday's ABIToday's TBIPrevious ABIPrevious TBI  +-------+-----------+-----------+------------+------------+  Right 0.92    0.33    [place]     0.55      +-------+-----------+-----------+------------+------------+  Left  0.51    0.26    0.66    0.64      +-------+-----------+-----------+------------+------------+  Monophasic flow bilaterally. Right toe pressure of 46 (was 85). Left toe pressure of 36 (was 99)  Duplex of right lower extremity bypass graft shows >70% stenosis at the proximal anastomosis with velocity of 562 PSV cm/s. Monophasic flow throughout the graft and diminished velocities in the distal graft  ASSESSMENT/PLAN:: 69 y.o. male here for follow up for peripheral artery disease. He currently is without any symptoms. He does have chronic lower extremity edema. He presents today with RLE cellulitis. I have sent Antibiotics- Keflex 500 mg BID x 10 days to his pharmacy. His ABIs have decreased bilaterally from prior study and his toe pressures bilaterally dropped significantly. His arterial duplex of his RLE bypass graft shows recurrent proximal anastomosis stenosis with monophasic flow and diminished distal velocities in the graft. He is asymptomatic but I am concerned about graft thrombosis/ failure if this is not treated. I have recommended Angiogram with possible   intervention - I have encouraged him to elevate his legs to help with edema  - I have also  encouraged him to continue to ambulate as much as he can - I have scheduled him to have an aortogram, arteriogram with possible intervention of RLE with Dr. Randie Heinz. This will be arranged next earliest availability over next couple weeks   Graceann Congress, [name] [facility] [phone]  Clinic MD:  Dr. Darrick Penna

## 2020-09-23 ENCOUNTER — Other Ambulatory Visit (HOSPITAL_COMMUNITY)
Admission: RE | Admit: 2020-09-23 | Discharge: 2020-09-23 | Disposition: A | Discharge status: Home / Self Care | Payer: Medicare HMO | Source: Ambulatory Visit | Attending: Vascular Surgery | Admitting: Vascular Surgery

## 2020-09-23 DIAGNOSIS — Z01812 Encounter for preprocedural laboratory examination: Secondary | ICD-10-CM | POA: Diagnosis present

## 2020-09-23 DIAGNOSIS — Z20822 Contact with and (suspected) exposure to covid-19: Secondary | ICD-10-CM | POA: Insufficient documentation

## 2020-09-24 LAB — SARS CORONAVIRUS 2 (TAT 6-24 HRS): SARS Coronavirus 2: NEGATIVE

## 2020-09-26 ENCOUNTER — Encounter (HOSPITAL_COMMUNITY)
Admission: RE | Disposition: A | Discharge status: Home / Self Care | Payer: Self-pay | Source: Home / Self Care | Attending: Vascular Surgery

## 2020-09-26 ENCOUNTER — Ambulatory Visit (HOSPITAL_COMMUNITY)
Admission: RE | Admit: 2020-09-26 | Discharge: 2020-09-26 | Disposition: A | Discharge status: Home / Self Care | Payer: Medicare HMO | Attending: Vascular Surgery | Admitting: Vascular Surgery

## 2020-09-26 ENCOUNTER — Other Ambulatory Visit: Payer: Self-pay

## 2020-09-26 DIAGNOSIS — E1151 Type 2 diabetes mellitus with diabetic peripheral angiopathy without gangrene: Secondary | ICD-10-CM | POA: Diagnosis not present

## 2020-09-26 DIAGNOSIS — R609 Edema, unspecified: Secondary | ICD-10-CM | POA: Diagnosis not present

## 2020-09-26 DIAGNOSIS — Z87891 Personal history of nicotine dependence: Secondary | ICD-10-CM | POA: Insufficient documentation

## 2020-09-26 DIAGNOSIS — Z7902 Long term (current) use of antithrombotics/antiplatelets: Secondary | ICD-10-CM | POA: Insufficient documentation

## 2020-09-26 DIAGNOSIS — Z7982 Long term (current) use of aspirin: Secondary | ICD-10-CM | POA: Diagnosis not present

## 2020-09-26 DIAGNOSIS — Z7984 Long term (current) use of oral hypoglycemic drugs: Secondary | ICD-10-CM | POA: Diagnosis not present

## 2020-09-26 DIAGNOSIS — L03115 Cellulitis of right lower limb: Secondary | ICD-10-CM | POA: Diagnosis not present

## 2020-09-26 DIAGNOSIS — Z79899 Other long term (current) drug therapy: Secondary | ICD-10-CM | POA: Diagnosis not present

## 2020-09-26 DIAGNOSIS — T82858A Stenosis of vascular prosthetic devices, implants and grafts, initial encounter: Secondary | ICD-10-CM

## 2020-09-26 DIAGNOSIS — I70211 Atherosclerosis of native arteries of extremities with intermittent claudication, right leg: Secondary | ICD-10-CM | POA: Insufficient documentation

## 2020-09-26 DIAGNOSIS — I739 Peripheral vascular disease, unspecified: Secondary | ICD-10-CM

## 2020-09-26 HISTORY — PX: PERIPHERAL VASCULAR BALLOON ANGIOPLASTY: CATH118281

## 2020-09-26 HISTORY — PX: ABDOMINAL AORTOGRAM W/LOWER EXTREMITY: CATH118223

## 2020-09-26 LAB — POCT I-STAT, CHEM 8
BUN: 29 mg/dL — ABNORMAL HIGH (ref 8–23)
Calcium, Ion: 1.31 mmol/L (ref 1.15–1.40)
Chloride: 104 mmol/L (ref 98–111)
Creatinine, Ser: 1.2 mg/dL (ref 0.61–1.24)
Glucose, Bld: 96 mg/dL (ref 70–99)
HCT: 39 % (ref 39.0–52.0)
Hemoglobin: 13.3 g/dL (ref 13.0–17.0)
Potassium: 3.7 mmol/L (ref 3.5–5.1)
Sodium: 141 mmol/L (ref 135–145)
TCO2: 31 mmol/L (ref 22–32)

## 2020-09-26 LAB — POCT ACTIVATED CLOTTING TIME: Activated Clotting Time: 279 seconds

## 2020-09-26 SURGERY — ABDOMINAL AORTOGRAM W/LOWER EXTREMITY
Anesthesia: LOCAL | Laterality: Right

## 2020-09-26 MED ORDER — HEPARIN SODIUM (PORCINE) 1000 UNIT/ML IJ SOLN
INTRAMUSCULAR | Status: DC | PRN
  Administered 2020-09-26: 10000 [IU] via INTRAVENOUS

## 2020-09-26 MED ORDER — MIDAZOLAM HCL 2 MG/2ML IJ SOLN
INTRAMUSCULAR | Status: DC | PRN
  Administered 2020-09-26: 1 mg via INTRAVENOUS

## 2020-09-26 MED ORDER — SODIUM CHLORIDE 0.9 % WEIGHT BASED INFUSION: 1.0000 mL/kg/h | INTRAVENOUS | Status: DC

## 2020-09-26 MED ORDER — HEPARIN (PORCINE) IN NACL 1000-0.9 UT/500ML-% IV SOLN
INTRAVENOUS | Status: AC
  Filled 2020-09-26: qty 500

## 2020-09-26 MED ORDER — MIDAZOLAM HCL 2 MG/2ML IJ SOLN
INTRAMUSCULAR | Status: AC
  Filled 2020-09-26: qty 2

## 2020-09-26 MED ORDER — LABETALOL HCL 5 MG/ML IV SOLN: 10.0000 mg | INTRAVENOUS | Status: DC | PRN

## 2020-09-26 MED ORDER — FENTANYL CITRATE (PF) 100 MCG/2ML IJ SOLN
INTRAMUSCULAR | Status: AC
  Filled 2020-09-26: qty 2

## 2020-09-26 MED ORDER — OXYCODONE HCL 5 MG PO TABS: 5.0000 mg | ORAL_TABLET | ORAL | Status: DC | PRN

## 2020-09-26 MED ORDER — SODIUM CHLORIDE 0.9 % IV SOLN
250.0000 mL | INTRAVENOUS | Status: DC | PRN
Start: 2020-09-26 — End: 2020-09-26

## 2020-09-26 MED ORDER — SODIUM CHLORIDE 0.9 % IV SOLN: INTRAVENOUS | Status: DC

## 2020-09-26 MED ORDER — IODIXANOL 320 MG/ML IV SOLN
INTRAVENOUS | Status: DC | PRN
  Administered 2020-09-26: 165 mL

## 2020-09-26 MED ORDER — HYDRALAZINE HCL 20 MG/ML IJ SOLN: 5.0000 mg | INTRAMUSCULAR | Status: DC | PRN

## 2020-09-26 MED ORDER — ACETAMINOPHEN 325 MG PO TABS: 650.0000 mg | ORAL_TABLET | ORAL | Status: DC | PRN

## 2020-09-26 MED ORDER — FENTANYL CITRATE (PF) 100 MCG/2ML IJ SOLN
INTRAMUSCULAR | Status: DC | PRN
  Administered 2020-09-26: 25 ug via INTRAVENOUS

## 2020-09-26 MED ORDER — HEPARIN SODIUM (PORCINE) 1000 UNIT/ML IJ SOLN
INTRAMUSCULAR | Status: AC
  Filled 2020-09-26: qty 1

## 2020-09-26 MED ORDER — LIDOCAINE HCL (PF) 1 % IJ SOLN
INTRAMUSCULAR | Status: AC
  Filled 2020-09-26: qty 30

## 2020-09-26 MED ORDER — SODIUM CHLORIDE 0.9% FLUSH: 3.0000 mL | INTRAVENOUS | Status: DC | PRN

## 2020-09-26 MED ORDER — ONDANSETRON HCL 4 MG/2ML IJ SOLN: 4.0000 mg | Freq: Four times a day (QID) | INTRAMUSCULAR | Status: DC | PRN

## 2020-09-26 MED ORDER — HEPARIN (PORCINE) IN NACL 1000-0.9 UT/500ML-% IV SOLN
INTRAVENOUS | Status: DC | PRN
  Administered 2020-09-26 (×2): 500 mL

## 2020-09-26 MED ORDER — HYDROMORPHONE HCL 1 MG/ML IJ SOLN: 0.5000 mg | INTRAMUSCULAR | Status: DC | PRN

## 2020-09-26 MED ORDER — SODIUM CHLORIDE 0.9% FLUSH: 3.0000 mL | Freq: Two times a day (BID) | INTRAVENOUS | Status: DC

## 2020-09-26 MED ORDER — LIDOCAINE HCL (PF) 1 % IJ SOLN
INTRAMUSCULAR | Status: DC | PRN
  Administered 2020-09-26: 18 mL

## 2020-09-26 SURGICAL SUPPLY — 18 items
BALLN STERLING OTW 6X60X135 (BALLOONS) ×3
BALLOON STERLING OTW 6X60X135 (BALLOONS) IMPLANT
CATH ANGIO 5F BER2 65CM (CATHETERS) ×3 IMPLANT
CATH OMNI FLUSH 5F 65CM (CATHETERS) ×2 IMPLANT
CLOSURE MYNX CONTROL 5F (Vascular Products) ×1 IMPLANT
DCB RANGER 6.0X60 135 (BALLOONS) IMPLANT
KIT ENCORE 26 ADVANTAGE (KITS) ×2 IMPLANT
KIT MICROPUNCTURE NIT STIFF (SHEATH) ×2 IMPLANT
KIT PV (KITS) ×3 IMPLANT
PATCH THROMBIX TOPICAL PLAIN (HEMOSTASIS) ×2 IMPLANT
RANGER DCB 6.0X60 135 (BALLOONS) ×3
SHEATH FLEX ANSEL ANG 5F 45CM (SHEATH) ×2 IMPLANT
SHEATH PINNACLE 5F 10CM (SHEATH) ×4 IMPLANT
SYR MEDRAD MARK V 150ML (SYRINGE) ×3 IMPLANT
TRANSDUCER W/STOPCOCK (MISCELLANEOUS) ×3 IMPLANT
TRAY PV CATH (CUSTOM PROCEDURE TRAY) ×3 IMPLANT
WIRE BENTSON .035X145CM (WIRE) ×2 IMPLANT
WIRE G V18X300CM (WIRE) ×3 IMPLANT

## 2020-09-26 NOTE — Discharge Instructions (Signed)
Femoral Site Care This sheet gives you information about how to care for yourself after your procedure. Your health care provider may also give you more specific instructions. If you have problems or questions, contact your health care provider. What can I expect after the procedure?  After the procedure, it is common to have:  Bruising that usually fades within 1-2 weeks.  Tenderness at the site. Follow these instructions at home: Wound care 1. Follow instructions from your health care provider about how to take care of your insertion site. Make sure you: ? Wash your hands with soap and water before you change your bandage (dressing). If soap and water are not available, use hand sanitizer. ? Remove your dressing as told by your health care provider. In 24 hours 2. Do not take baths, swim, or use a hot tub until your health care provider approves. 3. You may shower 24-48 hours after the procedure or as told by your health care provider. ? Gently wash the site with plain soap and water. ? Pat the area dry with a clean towel. ? Do not rub the site. This may cause bleeding. 4. Do not apply powder or lotion to the site. Keep the site clean and dry. 5. Check your femoral site every day for signs of infection. Check for: ? Redness, swelling, or pain. ? Fluid or blood. ? Warmth. ? Pus or a bad smell. Activity 1. For the first 2-3 days after your procedure, or as long as directed: ? Avoid climbing stairs as much as possible. ? Do not squat. 2. Do not lift anything that is heavier than 10 lb (4.5 kg), or the limit that you are told, until your health care provider says that it is safe. For 5 days 3. Rest as directed. ? Avoid sitting for a long time without moving. Get up to take short walks every 1-2 hours. 4. Do not drive for 24 hours if you were given a medicine to help you relax (sedative). General instructions  Take over-the-counter and prescription medicines only as told by your  health care provider.  Keep all follow-up visits as told by your health care provider. This is important. Contact a health care provider if you have:  A fever or chills.  You have redness, swelling, or pain around your insertion site. Get help right away if:  The catheter insertion area swells very fast.  You pass out.  You suddenly start to sweat or your skin gets clammy.  The catheter insertion area is bleeding, and the bleeding does not stop when you hold steady pressure on the area.  The area near or just beyond the catheter insertion site becomes pale, cool, tingly, or numb. These symptoms may represent a serious problem that is an emergency. Do not wait to see if the symptoms will go away. Get medical help right away. Call your local emergency services (911 in the [place] Do not drive yourself to the hospital. Summary  After the procedure, it is common to have bruising that usually fades within 1-2 weeks.  Check your femoral site every day for signs of infection.  Do not lift anything that is heavier than 10 lb (4.5 kg), or the limit that you are told, until your health care provider says that it is safe. This information is not intended to replace advice given to you by your health care provider. Make sure you discuss any questions you have with your health care provider. Document Revised: 09/16/2017 Document Reviewed: 09/16/2017   [employer]vier Patient Education  2020 [employer]   

## 2020-09-26 NOTE — Op Note (Signed)
    Patient name: Brandon Collins MRN: [id number] DOB: 12/02/1950 Sex: male  09/26/2020 Pre-operative Diagnosis: Bypass graft stenosis right lower extremity Post-operative diagnosis:  Same Surgeon:  Apolinar Junes C. Randie Heinz, MD Procedure Performed: 1.  Ultrasound-guided cannulation left common femoral artery 2.  Aortogram with bilateral lower extremity runoff 3.  Drug-coated balloon angioplasty right common femoral artery and proximal femoral to popliteal artery bypass graft with 6 x 60 mm Ranger 4.  Mynx device closure left common femoral artery 5.  Moderate sedation with fentanyl and Versed for 56 minutes   Indications: 70 year old male with a history of a right lower extremity bypass for wound.  He has had intermittent wounds with swelling his bypass has remained patent however he has previously undergone intervention for stenosis of the common femoral artery as well as the bypass itself.  He is now indicated for angiography with possible intervention.  Findings: The aorta had a steep bifurcation however was free of flow-limiting stenosis.  On the right side there is approximately 50% stenosis in the common femoral artery leading into the bypass graft where there is a 90% stenosis.  Distal distal bypass is patent distal anastomosis appears patent with two-vessel runoff via the peroneal and posterior tibial artery to the foot.  On the left side the SFA is diminutive proximally frankly occludes in the mid thigh reconstitutes below-knee popliteal artery with three-vessel to the foot.  After plain balloon angioplasty of the common femoral artery and the proximal bypass there was no residual stenosis.  This was then ballooned with 6 mm drug-coated balloon at nominal pressure for 3 minutes at which there was no residual stenosis.  The left common femoral artery was closed at completion.   Procedure:  The patient was identified in the holding area and taken to room 8.  The patient was then placed supine on the  table and prepped and draped in the usual sterile fashion.  A time out was called.  Ultrasound was used to evaluate the left common femoral artery this was noted to be free of disease with flow.  The area was anesthetized 1% lidocaine and cannulated with a micropuncture needle followed by wire and sheath.  And images saved to the permanent record.  Moderate sedation with fentanyl and Versed was administered and a nurse to monitor his vital signs throughout the case.  A Bentson wire was placed followed by 5 [place] sheath.  We placed Omni catheter to the level of L1 performed aortogram followed by bilateral lower extremity runoff.  We then crossed the bifurcation with Bentson wire and Omni catheter.  We placed a long 5 French sheath.  We crossed the lesions with V 18 wire.  We predilated with 6 mm plain balloon.  Completion demonstrated no residual stenosis.  We then used a drug-coated balloon 6 mm at nominal pressure for 3 minutes.  Completion demonstrated still no residual stenosis.  We exchanged over a Bentson wire for short 5 French sheath put a minx device in the left common femoral artery.  He tolerated procedure without any complication.  Contrast: 165cc   Ellen Mayol C. Randie Heinz, MD [facility] Office: [phone] Pager: [phone]

## 2020-09-26 NOTE — Interval H&P Note (Signed)
History and Physical Interval Note:  09/26/2020 7:27 AM  [name]  has presented today for surgery, with the diagnosis of pad.  The various methods of treatment have been discussed with the patient and family. After consideration of risks, benefits and other options for treatment, the patient has consented to  Procedure(s): ABDOMINAL AORTOGRAM W/LOWER EXTREMITY (N/A) as a surgical intervention.  The patient's history has been reviewed, patient examined, no change in status, stable for surgery.  I have reviewed the patient's chart and labs.  Questions were answered to the patient's satisfaction.     Lemar Livings

## 2020-09-29 ENCOUNTER — Encounter (HOSPITAL_COMMUNITY): Payer: Self-pay | Admitting: Vascular Surgery

## 2020-10-12 ENCOUNTER — Other Ambulatory Visit: Payer: Self-pay

## 2020-10-12 DIAGNOSIS — I739 Peripheral vascular disease, unspecified: Secondary | ICD-10-CM

## 2020-10-26 ENCOUNTER — Other Ambulatory Visit: Payer: Self-pay | Admitting: Vascular Surgery

## 2020-11-04 ENCOUNTER — Ambulatory Visit (INDEPENDENT_AMBULATORY_CARE_PROVIDER_SITE_OTHER)
Admit: 2020-11-04 | Discharge: 2020-11-04 | Disposition: A | Discharge status: Home / Self Care | Payer: Medicare HMO | Attending: Vascular Surgery | Admitting: Vascular Surgery

## 2020-11-04 ENCOUNTER — Ambulatory Visit (HOSPITAL_COMMUNITY)
Admission: RE | Admit: 2020-11-04 | Discharge: 2020-11-04 | Disposition: A | Discharge status: Home / Self Care | Payer: Medicare HMO | Source: Ambulatory Visit | Attending: Vascular Surgery | Admitting: Vascular Surgery

## 2020-11-04 ENCOUNTER — Ambulatory Visit (INDEPENDENT_AMBULATORY_CARE_PROVIDER_SITE_OTHER): Payer: Medicare HMO | Admitting: Physician Assistant

## 2020-11-04 ENCOUNTER — Other Ambulatory Visit: Payer: Self-pay

## 2020-11-04 VITALS — BP 138/68 | HR 65 | Temp 98.6°F | Resp 20 | Ht 72.0 in | Wt 261.2 lb

## 2020-11-04 DIAGNOSIS — I739 Peripheral vascular disease, unspecified: Secondary | ICD-10-CM

## 2020-11-04 DIAGNOSIS — R600 Localized edema: Secondary | ICD-10-CM | POA: Diagnosis not present

## 2020-11-04 DIAGNOSIS — I872 Venous insufficiency (chronic) (peripheral): Secondary | ICD-10-CM | POA: Diagnosis not present

## 2020-11-04 NOTE — Progress Notes (Signed)
Office Note     CC:  follow up; 1 month post aortogram Requesting Provider:  Nonnie DoneSlatosky, John J., MD  HPI: Brandon Collins is a 70 y.o. (01/21/1951) male who presents for routine follow-up of PAD. He  has a history of right common femoral endarterectomy and femoral to below-knee popliteal artery bypass with PTFE by Dr. Randie Heinzain on 01/27/2019. He he was found to have a stenosis with decreased velocities distally approximately 9 months later and therefore underwent aortogram with right lower extremity angiography.  Laser atherectomy and angioplasty of the right CFA and in-graft stenosis was performed on 10/26/2019 by Dr. Randie Heinzain.  Last visit on September 21, 2020, his arch arterial duplex of his right lower extremity bypass graft showed recurrent proximal anastomosis stenosis and he underwent aortogram on September 26, 2020.  Drug-coated balloon angioplasty of the right common femoral artery and proximal femoral to popliteal artery bypass graft was performed.  He presents today for follow-up from that procedure.  His wife accompanies him today.  He denies pain with walking or rest pain.  He has a right lower leg skin condition that waxes and wanes with redness and scaly skin.  At one point in the past, the patient was undergoing skin care at a [place] wound care center but this is since closed.  He continues to use Santyl as needed.  He has used compression stockings in the past but does not wear them consistently due to his skin condition.  He elevates his legs in a recliner.  He is compliant with aspirin, statin and Plavix. He is diabetic on oral agents. He quit smoking in the late 1990s. Past Medical History:  Diagnosis Date  . Arthritis    "all over" (08/12/2018)  . Complication of anesthesia 07/2018   "out of it for 2 weeks."  "Low Blood Sugar."  . High cholesterol   . HOH (hard of hearing)   . Hypertension   . Type II diabetes mellitus (HCC)   . Ulcer of ankle, right, with unspecified severity [facility](HCC)      Past Surgical History:  Procedure Laterality Date  . ABDOMINAL AORTOGRAM W/LOWER EXTREMITY N/A 01/22/2019   Procedure: ABDOMINAL AORTOGRAM W/LOWER EXTREMITY;  Surgeon: Maeola Harmanain, Brandon Christopher, MD;  [facility];  Service: Cardiovascular;  Laterality: N/A;  rt leg  . ABDOMINAL AORTOGRAM W/LOWER EXTREMITY N/A 10/26/2019   Procedure: ABDOMINAL AORTOGRAM W/LOWER EXTREMITY;  Surgeon: Maeola Harmanain, Brandon Christopher, MD;  [facility];  Service: Cardiovascular;  Laterality: N/A;  Rt lower extermity   . ABDOMINAL AORTOGRAM W/LOWER EXTREMITY Bilateral 09/26/2020   Procedure: ABDOMINAL AORTOGRAM W/LOWER EXTREMITY;  Surgeon: Maeola Harmanain, Brandon Christopher, MD;  [facility];  Service: Cardiovascular;  Laterality: Bilateral;  . ANTERIOR CERVICAL DECOMP/DISCECTOMY FUSION  08/12/2018   CERVICAL 3-4, CERVICAL 4-5, CERVICAL 5-6, CERVICAL 6-7 WITH INSTRUMENTATION AND ALLOGRAFT  . ANTERIOR CERVICAL DECOMPRESSION/DISCECTOMY FUSION 4 LEVELS N/A 08/12/2018   Procedure: ANTERIOR CERVICAL DECOMPRESSION FUSION CERVICAL 3-4, CERVICAL 4-5, CERVICAL 5-6, CERVICAL 6-7 WITH INSTRUMENTATION AND ALLOGRAFT;  Surgeon: Estill Bambergumonski, Mark, MD;  Location: [facility] OR;  Service: Orthopedics;  Laterality: N/A;  ANTERIOR CERVICAL DECOMPRESSION FUSION CERVICAL 3-4, CERVICAL 4-5, CERVICAL 5-6, CERVICAL 6-7 WITH INSTRUMENTATION AND ALLOGRAFT  . COLONOSCOPY W/ POLYPECTOMY    . FEMORAL-POPLITEAL BYPASS GRAFT Right 01/27/2019   Procedure: right FEMORAL ENDARTERECTOMY, RIGHT  FEMORAL- BELOW THE KNEE POPLITEAL ARTERY BYPASS GRAFT using 6mm propaten ringed gore graft, attempted right femoral to below knee popliteal artery bypass using right nonreversed great  saphenous vein;  Surgeon: Maeola Harman, MD;  Location: [facility] OR;  Service: Vascular;  Laterality: Right;  . INGUINAL HERNIA REPAIR  1958   "? side"  . KNEE ARTHROSCOPY Right   . PERIPHERAL VASCULAR ATHERECTOMY  10/26/2019   Procedure: PERIPHERAL  VASCULAR ATHERECTOMY;  Surgeon: Maeola Harman, MD;  Location: [facility] INVASIVE CV LAB;  Service: Cardiovascular;;  LASER  . PERIPHERAL VASCULAR BALLOON ANGIOPLASTY Right 09/26/2020   Procedure: PERIPHERAL VASCULAR BALLOON ANGIOPLASTY;  Surgeon: Maeola Harman, MD;  Location: [facility] INVASIVE CV LAB;  Service: Cardiovascular;  Laterality: Right;  SFA  FEMPOP BYPASS GRAFT  . PILONIDAL CYST EXCISION      Social History   Socioeconomic History  . Marital status: Married    Spouse name: Not on file  . Number of children: Not on file  . Years of education: Not on file  . Highest education level: Not on file  Occupational History  . Not on file  Tobacco Use  . Smoking status: Former Smoker    Packs/day: 1.00    Years: 30.00    Pack years: 30.00    Types: Cigarettes  . Smokeless tobacco: Never Used  . Tobacco comment: quit in late 1990's  Vaping Use  . Vaping Use: Never used  Substance and Sexual Activity  . Alcohol use: Not Currently    Comment: 08/12/2018 "nothing in the 2000s"  . Drug use: Never  . Sexual activity: Not Currently  Other Topics Concern  . Not on file  Social History Narrative  . Not on file   Social Determinants of Health   Financial Resource Strain: Not on file  Food Insecurity: Not on file  Transportation Needs: Not on file  Physical Activity: Not on file  Stress: Not on file  Social Connections: Not on file  Intimate Partner Violence: Not on file   Family History  Problem Relation Age of Onset  . Cancer Mother   . Stroke Father        multiple    Current Outpatient Medications  Medication Sig Dispense Refill  . amLODipine (NORVASC) 10 MG tablet Take 10 mg by mouth daily.    Marland Kitchen aspirin EC 81 MG tablet Take 81 mg by mouth daily.    Marland Kitchen atorvastatin (LIPITOR) 20 MG tablet Take 20 mg by mouth daily.  1  . cholecalciferol (VITAMIN D) 1000 units tablet Take 1 tablet (1,000 Units total) by mouth daily.    . clopidogrel (PLAVIX) 75 MG tablet  TAKE 1 TABLET BY MOUTH EVERY DAY 30 tablet 11  . feeding supplement, ENSURE ENLIVE, (ENSURE ENLIVE) LIQD Take 237 mLs by mouth 2 (two) times daily between meals. (Patient not taking: Reported on 09/22/2020) 237 mL 12  . glimepiride (AMARYL) 4 MG tablet Take 1 tablet (4 mg total) by mouth every evening. (Patient taking differently: Take 4 mg by mouth daily with breakfast.)    . hydrochlorothiazide (HYDRODIURIL) 25 MG tablet Take 25 mg by mouth daily.    Marland Kitchen lisinopril (ZESTRIL) 40 MG tablet Take 40 mg by mouth daily.    . Multiple Vitamins-Minerals (MULTIVITAMIN WITH MINERALS) tablet Take 1 tablet by mouth daily.    Letta Pate ULTRA test strip USE TO TEST FASTING SUGAR ONCE DAILY IN THE MORNING    . pioglitazone (ACTOS) 15 MG tablet Take 15 mg by mouth daily.     No current facility-administered medications for this visit.    No Known Allergies   REVIEW OF SYSTEMS:   [X]  denotes  positive finding, [ ]  denotes negative finding Cardiac  Comments:  Chest pain or chest pressure:    Shortness of breath upon exertion:    Short of breath when lying flat:    Irregular heart rhythm:        Vascular    Pain in calf, thigh, or hip brought on by ambulation:    Pain in feet at night that wakes you up from your sleep:     Blood clot in your veins:    Leg swelling:         Pulmonary    Oxygen at home:    Productive cough:     Wheezing:         Neurologic    Sudden weakness in arms or legs:     Sudden numbness in arms or legs:     Sudden onset of difficulty speaking or slurred speech:    Temporary loss of vision in one eye:     Problems with dizziness:         Gastrointestinal    Blood in stool:     Vomited blood:         Genitourinary    Burning when urinating:     Blood in urine:        Psychiatric    Major depression:         Hematologic    Bleeding problems:    Problems with blood clotting too easily:        Skin    Rashes or ulcers:        Constitutional    Fever or  chills:      PHYSICAL EXAMINATION:  Vitals:   11/04/20 1055  BP: 138/68  Pulse: 65  Resp: 20  Temp: 98.6 F (37 C)  TempSrc: Temporal  SpO2: 98%  Weight: 261 lb 3.2 oz (118.5 kg)  Height: 6' (1.829 m)    General:  WDWN in NAD; vital signs documented above Gait: Not observed HENT: WNL, normocephalic Pulmonary: normal non-labored breathing , without Rales, rhonchi,  wheezing Cardiac: regular HR Skin: with right lower leg rashes (see photo) Vascular Exam/Pulses: 2+ right femoral pulse, 1+ left femoral pulse.  Pedal pulses are not palpable. Extremities: without ischemic changes, without Gangrene , without cellulitis; without open wounds; bilateral lower extremity edema right greater than left Musculoskeletal: no muscle wasting or atrophy  Neurologic: A&O X 3;  No focal weakness or paresthesias are detected Psychiatric:  The pt has Normal affect.       Non-Invasive Vascular Imaging:   11/04/2020 Right: Patent right femoral-popliteal bypass graft with no evidence of  stenosis.   ABIs ABI/TBIToday's ABIToday's TBIPrevious ABIPrevious TBI  +-------+-----------+-----------+------------+------------+  Right 0.97    0.56    0.92    0.33      +-------+-----------+-----------+------------+------------+  Left  0.48    0.18    0.51    0.26      +-------+-----------+-----------+------------+------------+  Right great toe pressure today is 85 with biphasic waveform in the posterior tibial artery and monophasic waveform of the dorsalis pedis. Left great toe pressure is 27 with monophasic waveforms.  ASSESSMENT/PLAN:: 70 y.o. male here for follow up for PAD s/p balloon angioplasty of right fem-pop below knee bypass. Normal right ABI and improved TBI. ABIs slightly worse on the left.  His recent aortogram findings on the left side showed the SFA is diminutive proximally and frankly occludes in the mid thigh and reconstitutes the below-knee  popliteal artery with three-vessel runoff  to the foot.  He has no LE pain or ulcerations.    His chronic edema and RLE dermatitis put him at increased risk of ulceration and/or infection.  I have advised him to see a dermatologist and offered referral to [facility] wound care center.  The patient states he has an appointment with the podiatrist next month and prefers to wait until he is evaluated there and will ask about recommendations for his right lower leg dermatitis.  Have recommended elevation of lower extremities above the heart several times a day and to wear his compression stockings daily on his left leg.  Follow-up in in 6 months with right lower extremity arterial duplex and ABIs.  Continue aspirin statin and Plavix.  I advised the patient and his wife to call us sooner should he develop open skin areas or lower extremity pain as he is at risk for reocclusion of his bypass graft.  Milinda Antis, [name] [facility] [phone]  Clinic MD:   Randie Heinz

## 2020-11-10 ENCOUNTER — Other Ambulatory Visit: Payer: Self-pay | Admitting: *Deleted

## 2020-11-10 DIAGNOSIS — I739 Peripheral vascular disease, unspecified: Secondary | ICD-10-CM

## 2020-11-30 ENCOUNTER — Encounter: Payer: Self-pay | Admitting: Podiatry

## 2020-11-30 ENCOUNTER — Ambulatory Visit (INDEPENDENT_AMBULATORY_CARE_PROVIDER_SITE_OTHER): Payer: Medicare HMO | Admitting: Podiatry

## 2020-11-30 ENCOUNTER — Other Ambulatory Visit: Payer: Self-pay

## 2020-11-30 VITALS — BP 159/76 | HR 68 | Temp 98.2°F

## 2020-11-30 DIAGNOSIS — B351 Tinea unguium: Secondary | ICD-10-CM | POA: Diagnosis not present

## 2020-11-30 DIAGNOSIS — M2141 Flat foot [pes planus] (acquired), right foot: Secondary | ICD-10-CM

## 2020-11-30 DIAGNOSIS — E1151 Type 2 diabetes mellitus with diabetic peripheral angiopathy without gangrene: Secondary | ICD-10-CM

## 2020-11-30 DIAGNOSIS — R600 Localized edema: Secondary | ICD-10-CM

## 2020-11-30 DIAGNOSIS — M2142 Flat foot [pes planus] (acquired), left foot: Secondary | ICD-10-CM

## 2020-11-30 DIAGNOSIS — M79675 Pain in left toe(s): Secondary | ICD-10-CM | POA: Diagnosis not present

## 2020-11-30 DIAGNOSIS — M79674 Pain in right toe(s): Secondary | ICD-10-CM | POA: Diagnosis not present

## 2020-12-08 ENCOUNTER — Ambulatory Visit: Payer: Medicare HMO | Admitting: Podiatry

## 2020-12-08 NOTE — Progress Notes (Signed)
Subjective: Brandon Collins presents today for at risk foot care. Pt has h/o NIDDM with PAD and painful thick toenails that are difficult to trim. Pain interferes with ambulation. Aggravating factors include wearing enclosed shoe gear. Pain is relieved with periodic professional debridement..  Patient's blood sugar was "120-something" mg/dl this morning.  He states he is concerned about right lower extremity redness since his ankle ulcer 2 1/2 years ago. He denies any drainage. Denies any fever, chills, night sweats, nausea or vomiting.    Slatosky, Excell Seltzer., MD is patient's PCP. Last visit was about 6 months ago.  States he has no appointment next week.   No Known Allergies   Review of Systems: Negative except as noted in the HPI.   Objective: Vitals:   11/30/20 1111  BP: (!) 159/76  Pulse: 68  Temp: 98.2 F (36.8 C)    Brandon Collins is a pleasant 70 y.o. male in NAD. AAO X 3.  Vascular Examination: Capillary refill time to digits <4 seconds b/l lower extremities. Nonpalpable DP pulse(s) b/l lower extremities. Nonpalpable PT pulse(s) b/l lower extremities. Pedal hair absent. Lower extremity skin temperature gradient within normal limits. No pain with calf compression b/l. +2 pitting edema b/l lower extremities. No ischemia or gangrene noted b/l lower extremities.  Dermatological Examination: Pedal skin with normal turgor, texture and tone bilaterally. No open wounds bilaterally. No interdigital macerations bilaterally. Toenails 1-5 b/l elongated, discolored, dystrophic, thickened, crumbly with subungual debris and tenderness to dorsal palpation.  Musculoskeletal Examination: Normal muscle strength 5/5 to all lower extremity muscle groups bilaterally. No pain crepitus or joint limitation noted with ROM b/l. Pes planus deformity noted b/l.   Neurological Examination: Protective sensation intact 5/5 intact bilaterally with 10g monofilament b/l. Vibratory sensation diminished b/l.    Assessment: 1. Pain due to onychomycosis of toenails of both feet   2. Lower extremity edema   3. Pes planus of both feet   4. Type II diabetes mellitus with peripheral circulatory disorder (HCC)     Plan: -Examined patient. -Continue diabetic foot care principles -Advised patient to contact PCP regarding edema right leg. He related understanding. -Patient to continue soft, supportive shoe gear daily. -Toenails 1-5 b/l were debrided in length and girth with sterile nail nippers and dremel without iatrogenic bleeding.  -Patient to report any pedal injuries to medical professional immediately. -Patient/POA to call should there be question/concern in the interim.  Return in about 3 months (around 03/02/2021).  Freddie Breech, DPM

## 2021-03-15 ENCOUNTER — Ambulatory Visit: Payer: Medicare HMO | Admitting: Podiatry

## 2021-03-15 ENCOUNTER — Encounter: Payer: Self-pay | Admitting: Podiatry

## 2021-03-15 ENCOUNTER — Other Ambulatory Visit: Payer: Self-pay

## 2021-03-15 DIAGNOSIS — E1151 Type 2 diabetes mellitus with diabetic peripheral angiopathy without gangrene: Secondary | ICD-10-CM | POA: Diagnosis not present

## 2021-03-15 DIAGNOSIS — B351 Tinea unguium: Secondary | ICD-10-CM

## 2021-03-15 DIAGNOSIS — M79675 Pain in left toe(s): Secondary | ICD-10-CM | POA: Diagnosis not present

## 2021-03-15 DIAGNOSIS — M79674 Pain in right toe(s): Secondary | ICD-10-CM

## 2021-03-16 NOTE — Progress Notes (Signed)
  Subjective:  Patient ID: Brandon Collins, male    DOB: 1951-06-16,  MRN: [id number]  70 y.o. male presents with at risk foot care. Pt has h/o NIDDM with PAD and painful thick toenails that are difficult to trim. Pain interferes with ambulation. Aggravating factors include wearing enclosed shoe gear. Pain is relieved with periodic professional debridement..    Patient's blood sugar was 132 mg/dl on Monday. Patient did not check blood glucose this morning.  PCP: Nonnie Done., MD and he has a visit with him on tomorrow.  Review of Systems: Negative except as noted in the HPI.   No Known Allergies  Objective:  There were no vitals filed for this visit. Constitutional Patient is a pleasant 70 y.o. Caucasian male in NAD. AAO x 3.  Vascular Capillary refill time to digits <4 seconds b/l lower extremities. Nonpalpable pedal pulse(s) b/l lower extremities. Pedal hair absent. Lower extremity skin temperature gradient within normal limits. No pain with calf compression b/l. +2 pitting edema b/l lower extremities. No ischemia or gangrene noted b/l lower extremities. No cyanosis or clubbing noted.  Neurologic Normal speech. Protective sensation intact 5/5 intact bilaterally with 10g monofilament b/l. Vibratory sensation intact b/l.  Dermatologic Pedal skin with normal turgor, texture and tone b/l lower extremities No open wounds b/l lower extremities No interdigital macerations b/l lower extremities Toenails 1-5 b/l elongated, discolored, dystrophic, thickened, crumbly with subungual debris and tenderness to dorsal palpation.  Orthopedic: Normal muscle strength 5/5 to all lower extremity muscle groups bilaterally. No pain crepitus or joint limitation noted with ROM b/l. Pes planus deformity noted b/l.    No flowsheet data found.     Assessment:   1. Pain due to onychomycosis of toenails of both feet   2. Type II diabetes mellitus with peripheral circulatory disorder [facility])    Plan:  Patient was  evaluated and treated and all questions answered.  Onychomycosis with pain -Nails palliatively debridement as below. -Educated on self-care  Procedure: Nail Debridement Rationale: Pain Type of Debridement: manual, sharp debridement. Instrumentation: Nail nipper, rotary burr. Number of Nails: 10  -Examined patient. -Patient to continue soft, supportive shoe gear daily. -Toenails 1-5 b/l were debrided in length and girth with sterile nail nippers and dremel without iatrogenic bleeding.  -Iatrogenic laceration with light bleeding sustained during debridement of L 2nd toe.  Treated with Lumicain Hemostatic Solution and alcohol. Patient instructed to apply Neosporin to L 2nd toe once daily for 7 days. -Patient to report any pedal injuries to medical professional immediately. -Patient/POA to call should there be question/concern in the interim.  Return in about 3 months (around 06/15/2021).  Freddie Breech, DPM

## 2021-04-24 ENCOUNTER — Other Ambulatory Visit: Payer: Self-pay | Admitting: *Deleted

## 2021-04-24 DIAGNOSIS — L97311 Non-pressure chronic ulcer of right ankle limited to breakdown of skin: Secondary | ICD-10-CM

## 2021-04-24 DIAGNOSIS — I739 Peripheral vascular disease, unspecified: Secondary | ICD-10-CM

## 2021-05-10 ENCOUNTER — Ambulatory Visit (HOSPITAL_COMMUNITY)
Admission: RE | Admit: 2021-05-10 | Discharge: 2021-05-10 | Disposition: A | Discharge status: Home / Self Care | Payer: Medicare HMO | Source: Ambulatory Visit | Attending: Vascular Surgery | Admitting: Vascular Surgery

## 2021-05-10 ENCOUNTER — Ambulatory Visit (INDEPENDENT_AMBULATORY_CARE_PROVIDER_SITE_OTHER): Payer: Medicare HMO | Admitting: Physician Assistant

## 2021-05-10 ENCOUNTER — Ambulatory Visit (INDEPENDENT_AMBULATORY_CARE_PROVIDER_SITE_OTHER)
Admission: RE | Admit: 2021-05-10 | Discharge: 2021-05-10 | Disposition: A | Discharge status: Home / Self Care | Payer: Medicare HMO | Source: Ambulatory Visit | Attending: Vascular Surgery | Admitting: Vascular Surgery

## 2021-05-10 ENCOUNTER — Encounter: Payer: Self-pay | Admitting: Physician Assistant

## 2021-05-10 ENCOUNTER — Other Ambulatory Visit: Payer: Self-pay

## 2021-05-10 VITALS — BP 171/67 | HR 53 | Temp 98.1°F | Ht 72.0 in | Wt 266.7 lb

## 2021-05-10 DIAGNOSIS — I739 Peripheral vascular disease, unspecified: Secondary | ICD-10-CM | POA: Diagnosis not present

## 2021-05-10 DIAGNOSIS — L97311 Non-pressure chronic ulcer of right ankle limited to breakdown of skin: Secondary | ICD-10-CM | POA: Diagnosis not present

## 2021-05-10 NOTE — Progress Notes (Signed)
[facility] [place] HISTORY AND PHYSICAL   History of Present Illness:  Brandon Collins is a 70 y.o. (March 27, 1951) male who presents for routine follow-up of PAD. He has a history of right common femoral endarterectomy and femoral to below-knee popliteal artery bypass with PTFE by Dr. Randie Heinz on 01/27/2019.  He he was found to have a stenosis with decreased velocities distally approximately 9 months later and therefore underwent aortogram with right lower extremity angiography.  Laser atherectomy and angioplasty of the right CFA and in-graft stenosis was performed on 10/26/2019 by Dr. Randie Heinz.  Last visit on September 21, 2020, his arch arterial duplex of his right lower extremity bypass graft showed recurrent proximal anastomosis stenosis and he underwent aortogram on September 26, 2020.  Drug-coated balloon angioplasty of the right common femoral artery and proximal femoral to popliteal artery bypass graft was performed.  He denise rest pain and non healing wounds.  He wears compression on a daily basis for edema control.  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  He tries to be as active as he can and denise symptoms of claudication.     He is compliant with aspirin, statin and Plavix. He is diabetic on oral agents. He quit smoking in the late 1990s.  Past Medical History:  Diagnosis Date   Arthritis    "all over" (08/12/2018)   Complication of anesthesia 07/2018   "out of it for 2 weeks."  "Low Blood Sugar."   High cholesterol    HOH (hard of hearing)    Hypertension    Type II diabetes mellitus (HCC)    Ulcer of ankle, right, with unspecified severity [facility])     Past Surgical History:  Procedure Laterality Date   ABDOMINAL AORTOGRAM W/LOWER EXTREMITY N/A 01/22/2019   Procedure: ABDOMINAL AORTOGRAM W/LOWER EXTREMITY;  Surgeon: Maeola Harman, MD;  Location: [facility] INVASIVE CV LAB;  Service: Cardiovascular;  Laterality: N/A;  rt leg   ABDOMINAL  AORTOGRAM W/LOWER EXTREMITY N/A 10/26/2019   Procedure: ABDOMINAL AORTOGRAM W/LOWER EXTREMITY;  Surgeon: Maeola Harman, MD;  Location: [facility] INVASIVE CV LAB;  Service: Cardiovascular;  Laterality: N/A;  Rt lower extermity    ABDOMINAL AORTOGRAM W/LOWER EXTREMITY Bilateral 09/26/2020   Procedure: ABDOMINAL AORTOGRAM W/LOWER EXTREMITY;  Surgeon: Maeola Harman, MD;  Location: [facility] INVASIVE CV LAB;  Service: Cardiovascular;  Laterality: Bilateral;   ANTERIOR CERVICAL DECOMP/DISCECTOMY FUSION  08/12/2018   CERVICAL 3-4, CERVICAL 4-5, CERVICAL 5-6, CERVICAL 6-7 WITH INSTRUMENTATION AND ALLOGRAFT   ANTERIOR CERVICAL DECOMPRESSION/DISCECTOMY FUSION 4 LEVELS N/A 08/12/2018   Procedure: ANTERIOR CERVICAL DECOMPRESSION FUSION CERVICAL 3-4, CERVICAL 4-5, CERVICAL 5-6, CERVICAL 6-7 WITH INSTRUMENTATION AND ALLOGRAFT;  Surgeon: Estill Bamberg, MD;  Location: [facility] OR;  Service: Orthopedics;  Laterality: N/A;  ANTERIOR CERVICAL DECOMPRESSION FUSION CERVICAL 3-4, CERVICAL 4-5, CERVICAL 5-6, CERVICAL 6-7 WITH INSTRUMENTATION AND ALLOGRAFT   COLONOSCOPY W/ POLYPECTOMY     FEMORAL-POPLITEAL BYPASS GRAFT Right 01/27/2019   Procedure: right FEMORAL ENDARTERECTOMY, RIGHT  FEMORAL- BELOW THE KNEE POPLITEAL ARTERY BYPASS GRAFT using 35mm propaten ringed gore graft, attempted right femoral to below knee popliteal artery bypass using right nonreversed great saphenous vein;  Surgeon: Maeola Harman, MD;  Location: [facility] OR;  Service: Vascular;  Laterality: Right;   INGUINAL HERNIA REPAIR  1958   "? side"   KNEE ARTHROSCOPY Right    PERIPHERAL VASCULAR ATHERECTOMY  10/26/2019   Procedure: PERIPHERAL VASCULAR ATHERECTOMY;  Surgeon: Lemar Livings  Cristal Deer, MD;  Location: [facility] INVASIVE CV LAB;  Service: Cardiovascular;;  LASER   PERIPHERAL VASCULAR BALLOON ANGIOPLASTY Right 09/26/2020   Procedure: PERIPHERAL VASCULAR BALLOON ANGIOPLASTY;  Surgeon: Maeola Harman, MD;  Location: [facility] INVASIVE CV LAB;   Service: Cardiovascular;  Laterality: Right;  SFA  FEMPOP BYPASS GRAFT   PILONIDAL CYST EXCISION      ROS:   General:  No weight loss, Fever, chills  HEENT: No recent headaches, no nasal bleeding, no visual changes, no sore throat  Neurologic: No dizziness, blackouts, seizures. No recent symptoms of stroke or mini- stroke. No recent episodes of slurred speech, or temporary blindness.  Cardiac: No recent episodes of chest pain/pressure, no shortness of breath at rest.  No shortness of breath with exertion.  Denies history of atrial fibrillation or irregular heartbeat  Vascular: No history of rest pain in feet.  No history of claudication.  No history of non-healing ulcer, No history of DVT   Pulmonary: No home oxygen, no productive cough, no hemoptysis,  No asthma or wheezing  Musculoskeletal:  [ ]  Arthritis, [ ]  Low back pain,  [ ]  Joint pain  Hematologic:No history of hypercoagulable state.  No history of easy bleeding.  No history of anemia  Gastrointestinal: No hematochezia or melena,  No gastroesophageal reflux, no trouble swallowing  Urinary: [ ]  chronic Kidney disease, [ ]  on HD - [ ]  MWF or [ ]  TTHS, [ ]  Burning with urination, [ ]  Frequent urination, [ ]  Difficulty urinating;   Skin: No rashes  Psychological: No history of anxiety,  No history of depression  Social History Social History   Tobacco Use   Smoking status: Former    Packs/day: 1.00    Years: 30.00    Pack years: 30.00    Types: Cigarettes   Smokeless tobacco: Never   Tobacco comments:    quit in late 1990's  Vaping Use   Vaping Use: Never used  Substance Use Topics   Alcohol use: Not Currently    Comment: 08/12/2018 "nothing in the 2000s"   Drug use: Never    Family History Family History  Problem Relation Age of Onset   Cancer Mother    Stroke Father        multiple    Allergies  No Known Allergies   Current Outpatient Medications  Medication Sig Dispense Refill   amLODipine  (NORVASC) 10 MG tablet Take 10 mg by mouth daily.     aspirin EC 81 MG tablet Take 81 mg by mouth daily.     atorvastatin (LIPITOR) 20 MG tablet Take 20 mg by mouth daily.  1   cholecalciferol (VITAMIN D) 1000 units tablet Take 1 tablet (1,000 Units total) by mouth daily.     clopidogrel (PLAVIX) 75 MG tablet TAKE 1 TABLET BY MOUTH EVERY DAY 30 tablet 11   glimepiride (AMARYL) 4 MG tablet Take 1 tablet (4 mg total) by mouth every evening. (Patient taking differently: Take 4 mg by mouth daily with breakfast.)     hydrochlorothiazide (HYDRODIURIL) 25 MG tablet Take 25 mg by mouth daily.     lisinopril (ZESTRIL) 40 MG tablet Take 40 mg by mouth daily. Take 40 mg by mouth daily     Multiple Vitamins-Minerals (MULTIVITAMIN WITH MINERALS) tablet Take 1 tablet by mouth daily.     Naproxen (NAPROSYN PO) naproxen     ONETOUCH ULTRA test strip USE TO TEST FASTING SUGAR ONCE DAILY IN THE MORNING     pioglitazone (ACTOS)  15 MG tablet Take 15 mg by mouth daily.     pneumococcal 13-valent conjugate vaccine (PREVNAR 13) SUSP injection Prevnar 13 (PF) 0.5 mL intramuscular syringe  INJECT O.5MLS AS ONE DOSE     No current facility-administered medications for this visit.    Physical Examination  Vitals:   05/10/21 1118  BP: (!) 171/67  Pulse: (!) 53  Temp: 98.1 F (36.7 C)  TempSrc: Temporal  SpO2: 96%  Weight: 266 lb 11.2 oz (121 kg)  Height: 6' (1.829 m)    Body mass index is 36.17 kg/m.  General:  Alert and oriented, no acute distress HEENT: Normal Neck: No bruit or JVD Pulmonary: Clear to auscultation bilaterally Cardiac: Regular Rate and Rhythm without murmur Abdomen: Soft, non-tender, non-distended, no mass, no scars Skin: No rash, right anterior shin with discoloration, no open wounds Musculoskeletal: right > left deformity and edema Into the feet dorsum Neurologic: Upper and lower extremity motor grossly intact   DATA:     Right Graft #1: Fem - Pop   +------------------+--------+---------------+--------+--------------+                    PSV cm/sStenosis       WaveformComments        +------------------+--------+---------------+--------+--------------+  Inflow            273     50-74% stenosisbiphasic                +------------------+--------+---------------+--------+--------------+  Prox Anastomosis  306     50-70% stenosisbiphasic                +------------------+--------+---------------+--------+--------------+  Proximal Graft    133                    biphasic                +------------------+--------+---------------+--------+--------------+  Mid Graft         140                    biphasic                +------------------+--------+---------------+--------+--------------+  Distal Graft                                     not visualized  +------------------+--------+---------------+--------+--------------+  Distal Anastomosis                               not visualized  +------------------+--------+---------------+--------+--------------+  Outflow           88                     biphasic                +------------------+--------+---------------+--------+--------------+          Summary:  Right: 50-74% stenosis noted in the common femoral artery. 50-70% stenosis  at the proximal anastomosis of the bypass graft.       ABI Findings:  +---------+------------------+-----+--------+--------+  Right    Rt Pressure (mmHg)IndexWaveformComment   +---------+------------------+-----+--------+--------+  Brachial 174                                      +---------+------------------+-----+--------+--------+  PTA  185               1.06 biphasic          +---------+------------------+-----+--------+--------+  DP       162               0.93 biphasic          +---------+------------------+-----+--------+--------+  Great Toe86                0.49  Abnormal          +---------+------------------+-----+--------+--------+   +---------+------------------+-----+----------+-------+  Left     Lt Pressure (mmHg)IndexWaveform  Comment  +---------+------------------+-----+----------+-------+  PTA      93                0.53 monophasic         +---------+------------------+-----+----------+-------+  DP       128               0.74 monophasic         +---------+------------------+-----+----------+-------+  Great Toe75                0.43 Abnormal           +---------+------------------+-----+----------+-------+   +-------+-----------+-----------+------------+------------+  ABI/TBIToday's ABIToday's TBIPrevious ABIPrevious TBI  +-------+-----------+-----------+------------+------------+  Right  1.06       0.49       0.97        0.56          +-------+-----------+-----------+------------+------------+  Left   0.74       0.43       0.48        0.18          +-------+-----------+-----------+------------+------------+    Summary:  Right: Resting right ankle-brachial index is within normal range. The  right toe-brachial index is abnormal.   Left: Resting left ankle-brachial index indicates moderate left lower  extremity arterial disease. The left toe-brachial index is abnormal.   ASSESSMENT:  PAD s/p  right common femoral endarterectomy and femoral to below-knee popliteal artery bypass with PTFE by Dr. Randie Heinzain on 01/27/2019. With multiple stenosic interventions to include angioplasty and atherectomy.    He has some evidence of re-stenosis on duplex at the proximal anastomosis.  Right: 50-74% stenosis noted in the common femoral artery. 50-70% stenosis  at the proximal anastomosis of the bypass graft.   ABI's Right 1.06 and left 0.74.  He has been followed by Dermatology and his right anterior shin skin has improved with topical treatment.    PLAN: We reviewed signs of ischemia if these occur he will  call.  He is stable and will cont. To be as active as possible.  He will f/u in 6 months for repeat ABI and bypass duplex.  He will continue with compression to aid with edema which is lymph edema in character.     Mosetta PigeonEmma Maureen Barrett Holthaus [name] [facility] [place] Office: [phone]  MD in clinic [place]

## 2021-05-11 ENCOUNTER — Other Ambulatory Visit: Payer: Self-pay

## 2021-05-11 DIAGNOSIS — I739 Peripheral vascular disease, unspecified: Secondary | ICD-10-CM

## 2021-09-21 ENCOUNTER — Ambulatory Visit: Payer: Medicare HMO | Admitting: Podiatry

## 2021-09-21 ENCOUNTER — Encounter: Payer: Self-pay | Admitting: Podiatry

## 2021-09-21 ENCOUNTER — Other Ambulatory Visit: Payer: Self-pay

## 2021-09-21 DIAGNOSIS — M79674 Pain in right toe(s): Secondary | ICD-10-CM | POA: Diagnosis not present

## 2021-09-21 DIAGNOSIS — M79675 Pain in left toe(s): Secondary | ICD-10-CM

## 2021-09-21 DIAGNOSIS — B351 Tinea unguium: Secondary | ICD-10-CM | POA: Diagnosis not present

## 2021-09-21 DIAGNOSIS — D689 Coagulation defect, unspecified: Secondary | ICD-10-CM

## 2021-09-21 NOTE — Progress Notes (Signed)
°  Subjective:  Patient ID: Brandon Collins, male    DOB: 18-Feb-1951,  MRN: [id number]  Chief Complaint  Patient presents with   Nail Problem    Trim nails    71 y.o. male presents with the above complaint. History confirmed with patient.   PCP: Nonnie Done., MD; last seen 09/18/21  Objective:  Physical Exam: warm, good capillary refill, nail exam onychomycosis of the toenails, no trophic changes or ulcerative lesions. DP pulses palpable, PT pulses palpable, and protective sensation intact Left Foot: normal exam, no swelling, tenderness, instability; ligaments intact, full range of motion of all ankle/foot joints  Right Foot: normal exam, no swelling, tenderness, instability; ligaments intact, full range of motion of all ankle/foot joints   No results found for: HGBA1C  No images are attached to the encounter.  Assessment:   1. Pain due to onychomycosis of toenails of both feet   2. Coagulation defect [facility])    Plan:  Patient was evaluated and treated and all questions answered.  Onychomycosis -Patient is diabetic with a qualifying condition for at risk foot care.  Procedure: Nail Debridement Type of Debridement: manual, sharp debridement. Instrumentation: Nail nipper, rotary burr. Number of Nails: 10  Return in about 4 months (around 01/19/2022) for Diabetic Foot Care.

## 2021-11-15 ENCOUNTER — Ambulatory Visit: Payer: Medicare HMO | Admitting: Physician Assistant

## 2021-11-15 ENCOUNTER — Ambulatory Visit (INDEPENDENT_AMBULATORY_CARE_PROVIDER_SITE_OTHER)
Admission: RE | Admit: 2021-11-15 | Discharge: 2021-11-15 | Disposition: A | Discharge status: Home / Self Care | Payer: Medicare HMO | Source: Ambulatory Visit | Attending: Vascular Surgery | Admitting: Vascular Surgery

## 2021-11-15 ENCOUNTER — Ambulatory Visit (HOSPITAL_COMMUNITY)
Admission: RE | Admit: 2021-11-15 | Discharge: 2021-11-15 | Disposition: A | Discharge status: Home / Self Care | Payer: Medicare HMO | Source: Ambulatory Visit | Attending: Vascular Surgery | Admitting: Vascular Surgery

## 2021-11-15 ENCOUNTER — Other Ambulatory Visit: Payer: Self-pay

## 2021-11-15 ENCOUNTER — Encounter: Payer: Self-pay | Admitting: Physician Assistant

## 2021-11-15 VITALS — BP 162/70 | HR 48 | Temp 97.7°F | Resp 20 | Ht 72.0 in | Wt 263.5 lb

## 2021-11-15 DIAGNOSIS — M7989 Other specified soft tissue disorders: Secondary | ICD-10-CM | POA: Diagnosis not present

## 2021-11-15 DIAGNOSIS — I739 Peripheral vascular disease, unspecified: Secondary | ICD-10-CM

## 2021-11-15 NOTE — Progress Notes (Signed)
HISTORY AND PHYSICAL     CC:  follow up. Requesting Provider:  Enid Skeens., MD  HPI: This is a 71 y.o. male who is here today for follow up for PAD.  He has a history of right common femoral endarterectomy and femoral to below-knee popliteal artery bypass with PTFE by Dr. [name] on 01/27/2019.  He he was found to have a stenosis with decreased velocities distally approximately 9 months later and therefore underwent aortogram with right lower extremity angiography.  Laser atherectomy and angioplasty of the right CFA and in-graft stenosis was performed on 10/26/2019 by Dr. [name]. On September 21, 2020, his arterial duplex of his right lower extremity bypass graft showed recurrent proximal anastomosis stenosis and he underwent aortogram on September 26, 2020.  Drug-coated balloon angioplasty of the right common femoral artery and proximal femoral to popliteal artery bypass graft was performed.  Pt was last seen August 2022 and at that time, he was doing well without rest pain or non healing wounds.  He was wearing compression for leg swelling.  He was ambulating with a cane due to OA of the right knee.  He continue to try to be as active as possible.   He was seen by podiatry in January and was doing well.  The pt returns today for follow up.  He states he has been doing well.  He does have leg swelling and he does wear compression.  He does elevate his legs.  He does have a small scab on the right 3rd toe but he states this is getting better.  He is compliant with his asa/statin/plavix.  He denies any rest pain or non healing wounds or claudication.  He is ambulating with a cane.    The pt is on a statin for cholesterol management.    The pt is on an aspirin.    Other AC:  Plavix The pt is on CCB, ACEI for hypertension.  The pt is have diabetes. Tobacco hx:  former  Pt does not have family hx of AAA.  Past Medical History:  Diagnosis Date   Arthritis    "all over" (44/81/8563)   Complication of  anesthesia 07/2018   "out of it for 2 weeks."  "Low Blood Sugar."   High cholesterol    HOH (hard of hearing)    Hypertension    Type II diabetes mellitus ([place])    Ulcer of ankle, right, with unspecified severity [facility])     Past Surgical History:  Procedure Laterality Date   ABDOMINAL AORTOGRAM W/LOWER EXTREMITY N/A 01/22/2019   Procedure: ABDOMINAL AORTOGRAM W/LOWER EXTREMITY;  Surgeon: Waynetta Sandy, MD;  Location: [place] [facility];  Service: Cardiovascular;  Laterality: N/A;  rt leg   ABDOMINAL AORTOGRAM W/LOWER EXTREMITY N/A 10/26/2019   Procedure: ABDOMINAL AORTOGRAM W/LOWER EXTREMITY;  Surgeon: Waynetta Sandy, MD;  Location: [place] [facility];  Service: Cardiovascular;  Laterality: N/A;  Rt lower extermity    ABDOMINAL AORTOGRAM W/LOWER EXTREMITY Bilateral 09/26/2020   Procedure: ABDOMINAL AORTOGRAM W/LOWER EXTREMITY;  Surgeon: Waynetta Sandy, MD;  Location: [place] [facility];  Service: Cardiovascular;  Laterality: Bilateral;   ANTERIOR CERVICAL DECOMP/DISCECTOMY FUSION  08/12/2018   CERVICAL 3-4, CERVICAL 4-5, CERVICAL 5-6, CERVICAL 6-7 WITH INSTRUMENTATION AND ALLOGRAFT   ANTERIOR CERVICAL DECOMPRESSION/DISCECTOMY FUSION 4 LEVELS N/A 08/12/2018   Procedure: ANTERIOR CERVICAL DECOMPRESSION FUSION CERVICAL 3-4, CERVICAL 4-5, CERVICAL 5-6, CERVICAL 6-7 WITH INSTRUMENTATION AND ALLOGRAFT;  Surgeon: Phylliss Bob, MD;  Location: [place];  Service: Orthopedics;  Laterality: N/A;  ANTERIOR CERVICAL DECOMPRESSION FUSION CERVICAL 3-4, CERVICAL 4-5, CERVICAL 5-6, CERVICAL 6-7 WITH INSTRUMENTATION AND ALLOGRAFT   COLONOSCOPY W/ POLYPECTOMY     FEMORAL-POPLITEAL BYPASS GRAFT Right 01/27/2019   Procedure: right FEMORAL ENDARTERECTOMY, RIGHT  FEMORAL- BELOW THE KNEE POPLITEAL ARTERY BYPASS GRAFT using 2m propaten ringed gore graft, attempted right femoral to below knee popliteal artery bypass using right nonreversed great saphenous vein;  Surgeon: CWaynetta Sandy MD;  [place]  Service: Vascular;  Laterality: Right;   [place]  "? side"   KNEE ARTHROSCOPY Right    PERIPHERAL VASCULAR ATHERECTOMY  10/26/2019   Procedure: PERIPHERAL VASCULAR ATHERECTOMY;  Surgeon: CWaynetta Sandy MD;  [place];  Service: Cardiovascular;;  LASER   PERIPHERAL VASCULAR BALLOON ANGIOPLASTY Right 09/26/2020   Procedure: PERIPHERAL VASCULAR BALLOON ANGIOPLASTY;  Surgeon: CWaynetta Sandy MD;  [place] [facility];  Service: Cardiovascular;  Laterality: Right;  SFA  FEMPOP BYPASS GRAFT   PILONIDAL CYST EXCISION      No Known Allergies  Current Outpatient Medications  Medication Sig Dispense Refill   Accu-Chek Softclix Lancets lancets daily.     amLODipine (NORVASC) 10 MG tablet Take 10 mg by mouth daily.     aspirin EC 81 MG tablet Take 81 mg by mouth daily.     atenolol (TENORMIN) 50 MG tablet Take 50 mg by mouth daily.     atorvastatin (LIPITOR) 20 MG tablet Take 20 mg by mouth daily.  1   Blood Glucose Monitoring Suppl (ACCU-CHEK GUIDE) w/Device KIT See admin instructions.     cholecalciferol (VITAMIN D) 1000 units tablet Take 1 tablet (1,000 Units total) by mouth daily.     clopidogrel (PLAVIX) 75 MG tablet TAKE 1 TABLET BY MOUTH EVERY DAY 30 tablet 11   doxycycline (VIBRA-TABS) 100 MG tablet Take 100 mg by mouth 2 (two) times daily.     glimepiride (AMARYL) 4 MG tablet Take 1 tablet (4 mg total) by mouth every evening. (Patient taking differently: Take 4 mg by mouth daily with breakfast.)     hydrochlorothiazide (HYDRODIURIL) 25 MG tablet Take 25 mg by mouth daily.     lisinopril (ZESTRIL) 40 MG tablet Take 40 mg by mouth daily. Take 40 mg by mouth daily     Multiple Vitamins-Minerals (MULTIVITAMIN WITH MINERALS) tablet Take 1 tablet by mouth daily.     Naproxen (NAPROSYN PO) naproxen     ONETOUCH ULTRA test strip USE TO TEST FASTING SUGAR ONCE DAILY IN THE MORNING      pioglitazone (ACTOS) 15 MG tablet Take 15 mg by mouth daily.     pneumococcal 13-valent conjugate vaccine (PREVNAR 13) SUSP injection Prevnar 13 (PF) 0.5 mL intramuscular syringe  INJECT O.5MLS AS ONE DOSE     triamcinolone cream (KENALOG) 0.1 % PLEASE SEE ATTACHED FOR DETAILED DIRECTIONS     No current facility-administered medications for this visit.    Family History  Problem Relation Age of Onset   Cancer Mother    Stroke Father        multiple    Social History   Socioeconomic History   Marital status: Married    Spouse name: Not on file   Number of children: Not on file   Years of education: Not on file   Highest education level: Not on file  Occupational History   Not on file  Tobacco Use   Smoking status: Former  Packs/day: 1.00    Years: 30.00    Pack years: 30.00    Types: Cigarettes   Smokeless tobacco: Never   Tobacco comments:    quit in late 1990's  Vaping Use   Vaping Use: Never used  Substance and Sexual Activity   Alcohol use: Not Currently    Comment: 08/12/2018 "nothing in the 2000s"   Drug use: Never   Sexual activity: Not Currently  Other Topics Concern   Not on file  Social History Narrative   Not on file   Social Determinants of Health   Financial Resource Strain: Not on file  Food Insecurity: Not on file  Transportation Needs: Not on file  Physical Activity: Not on file  Stress: Not on file  Social Connections: Not on file  Intimate Partner Violence: Not on file     REVIEW OF SYSTEMS:   _0  denotes positive finding, _1  denotes negative finding Cardiac  Comments:  Chest pain or chest pressure:    Shortness of breath upon exertion:    Short of breath when lying flat:    Irregular heart rhythm:        Vascular    Pain in calf, thigh, or hip brought on by ambulation:    Pain in feet at night that wakes you up from your sleep:     Blood clot in your veins:    Leg swelling:  x       Pulmonary    Oxygen at home:     Productive cough:     Wheezing:         Neurologic    Sudden weakness in arms or legs:     Sudden numbness in arms or legs:     Sudden onset of difficulty speaking or slurred speech:    Temporary loss of vision in one eye:     Problems with dizziness:         Gastrointestinal    Blood in stool:     Vomited blood:         Genitourinary    Burning when urinating:     Blood in urine:        Psychiatric    Major depression:         Hematologic    Bleeding problems:    Problems with blood clotting too easily:        Skin    Rashes or ulcers:        Constitutional    Fever or chills:      PHYSICAL EXAMINATION:  Today's Vitals   11/15/21 1417  BP: (!) 162/70  Pulse: (!) 48  Resp: 20  Temp: 97.7 F (36.5 C)  TempSrc: Temporal  SpO2: 98%  Weight: 263 lb 8 oz (119.5 kg)  Height: 6' (1.829 m)   Body mass index is 35.74 kg/m.   General:  WDWN in NAD; vital signs documented above Gait: Not observed HENT: WNL, normocephalic Pulmonary: normal non-labored breathing , without wheezing Cardiac: regular HR, without carotid bruits Abdomen: soft, NT, no masses; aortic pulse is not palpable Skin: mild bruising on arms from plavix Vascular Exam/Pulses:  Right Left  Radial 2+ (normal) 2+ (normal)  Popliteal Unable to palpate Unable to palpate  DP Brisk doppler Brisk doppler  PT Brisk doppler Brisk doppler  Peroneal Faint doppler Faint doppler   Extremities: without ischemic changes, without Gangrene , without cellulitis; without open wounds; +stemmer's sign bilaterally Musculoskeletal: no muscle wasting or atrophy  Neurologic: A&O  X 3 Psychiatric:  The pt has Normal affect.   Non-Invasive Vascular Imaging:   ABI's/TBI's on 11/15/2021: Right:  1.38/0.69 (M) - Great toe pressure: 104 Left:  0.66/0.27 (M) - Great toe pressure: 41  Arterial duplex on 11/15/2021: Right Graft #1:  +------------------+--------+---------------+----------+--------+                      PSV  cm/s Stenosis        Waveform   Comments   +------------------+--------+---------------+----------+--------+   Inflow             183                      biphasic              +------------------+--------+---------------+----------+--------+   Prox Anastomosis   251      50-70% stenosis triphasic             +------------------+--------+---------------+----------+--------+   Proximal Graft     287      50-70% stenosis biphasic              +------------------+--------+---------------+----------+--------+   Mid Graft          97                       biphasic              +------------------+--------+---------------+----------+--------+   Distal Graft       90                       monophasic brisk      +------------------+--------+---------------+----------+--------+   Distal Anastomosis 100                      biphasic   broad      +------------------+--------+---------------+----------+--------+   Outflow            139                      monophasic brisk      +------------------+--------+---------------+----------+--------+   Summary:  Right: Patent right femoral to popliteal bypass graft with increased  velocity in the proximal anastomosis and the proximal graft (50 - 70%  stenosis range).    Previous ABI's/TBI's on 05/10/2021: Right:  1.06/0.49 (B) - Great toe pressure: 86 Left:  0.74/0.43 (M) - Great toe pressure:  75  Previous arterial duplex on 05/10/2021: Right Graft #1: Fem - Pop  +------------------+--------+---------------+--------+--------------+                      PSV cm/s Stenosis        Waveform Comments         +------------------+--------+---------------+--------+--------------+   Inflow             273      50-74% stenosis biphasic                  +------------------+--------+---------------+--------+--------------+   Prox Anastomosis   306      50-70% stenosis biphasic                  +------------------+--------+---------------+--------+--------------+    Proximal Graft     133                      biphasic                  +------------------+--------+---------------+--------+--------------+  Mid Graft          140                      biphasic                  +------------------+--------+---------------+--------+--------------+   Distal Graft                                         not visualized   +------------------+--------+---------------+--------+--------------+   Distal Anastomosis                                   not visualized   +------------------+--------+---------------+--------+--------------+   Outflow            88                       biphasic                  +------------------+--------+---------------+--------+--------------+    ASSESSMENT/PLAN:: 71 y.o. male here for follow up for hx of right common femoral endarterectomy and femoral to below-knee popliteal artery bypass with PTFE by Dr. [name] on 01/27/2019.  He he was found to have a stenosis with decreased velocities distally approximately 9 months later and therefore underwent aortogram with right lower extremity angiography.  Laser atherectomy and angioplasty of the right CFA and in-graft stenosis was performed on 10/26/2019 by Dr. [name]. On September 21, 2020, his arterial duplex of his right lower extremity bypass graft showed recurrent proximal anastomosis stenosis and he underwent aortogram on September 26, 2020.  Drug-coated balloon angioplasty of the right common femoral artery and proximal femoral to popliteal artery bypass graft was performed.  PAD -pt doing well without rest pain, claudication or non healing wounds.  His arterial duplex reveals 50-74% stenosis in the inflow and proximal anastomosis.  Given this is essentially unchanged from previous exam and pt is asymptomatic, will continue with observation.   -pt will f/u in 6 months with ABI and RLE arterial duplex.  He knows to call sooner if he has any issues before then.   -continue asa/statin/plavix  Leg  swelling -he will continue to wear his compression and continue with leg elevation.    Leontine Locket, [facility] [phone]  Clinic MD:   [name]

## 2021-11-17 ENCOUNTER — Other Ambulatory Visit: Payer: Self-pay | Admitting: *Deleted

## 2021-11-17 DIAGNOSIS — R600 Localized edema: Secondary | ICD-10-CM

## 2021-11-17 DIAGNOSIS — I739 Peripheral vascular disease, unspecified: Secondary | ICD-10-CM

## 2021-11-17 DIAGNOSIS — L97311 Non-pressure chronic ulcer of right ankle limited to breakdown of skin: Secondary | ICD-10-CM

## 2021-11-27 ENCOUNTER — Other Ambulatory Visit: Payer: Self-pay | Admitting: Vascular Surgery

## 2022-01-04 ENCOUNTER — Ambulatory Visit (INDEPENDENT_AMBULATORY_CARE_PROVIDER_SITE_OTHER): Payer: Medicare HMO | Admitting: Podiatry

## 2022-01-04 ENCOUNTER — Encounter: Payer: Self-pay | Admitting: Podiatry

## 2022-01-04 DIAGNOSIS — M79674 Pain in right toe(s): Secondary | ICD-10-CM | POA: Diagnosis not present

## 2022-01-04 DIAGNOSIS — E119 Type 2 diabetes mellitus without complications: Secondary | ICD-10-CM | POA: Diagnosis not present

## 2022-01-04 DIAGNOSIS — M2142 Flat foot [pes planus] (acquired), left foot: Secondary | ICD-10-CM

## 2022-01-04 DIAGNOSIS — E1151 Type 2 diabetes mellitus with diabetic peripheral angiopathy without gangrene: Secondary | ICD-10-CM | POA: Diagnosis not present

## 2022-01-04 DIAGNOSIS — B351 Tinea unguium: Secondary | ICD-10-CM | POA: Diagnosis not present

## 2022-01-04 DIAGNOSIS — M2141 Flat foot [pes planus] (acquired), right foot: Secondary | ICD-10-CM

## 2022-01-04 DIAGNOSIS — M79675 Pain in left toe(s): Secondary | ICD-10-CM

## 2022-01-14 NOTE — Progress Notes (Signed)
ANNUAL DIABETIC FOOT EXAM ? ?Subjective: ?Brandon Collins presents today for annual diabetic foot examination. ? ?Patient relates 26 year h/o diabetes. ? ?Patient denies any h/o foot wounds. ? ?Patient denies any numbness, tingling, burning, or pins/needle sensation in feet. ? ?Patient's blood sugar was 120 mg/dl today. Last known  HgA1c was below 7%. ? ?Patient has chronic LE edema b/l R>L. He wears compression hose on RLE on occasion and also elevates his feet. ? ?Risk factors: diabetes, history of foot/leg ulcer, PAD, HTN, dyslipidemia, h/o tobacco use in remission. ? ?Enid Skeens., MD is patient's PCP. Last visit was December, 2022. ? ?Past Medical History:  ?Diagnosis Date  ? Arthritis   ? "all over" (08/12/2018)  ? Complication of anesthesia 07/2018  ? "out of it for 2 weeks."  "Low Blood Sugar."  ? High cholesterol   ? HOH (hard of hearing)   ? Hypertension   ? Type II diabetes mellitus ([place])   ? Ulcer of ankle, right, with unspecified severity ([place])   ? ?Patient Active Problem List  ? Diagnosis Date Noted  ? Flexion contracture of knee, right 08/18/2019  ? PAD (peripheral artery disease) ([place]) 01/27/2019  ? Ankle ulcer, right, limited to breakdown of skin ([place]) 10/21/2018  ? Primary osteoarthritis of right knee 10/21/2018  ? Hyponatremia   ? Pain   ? Cervical myelopathy ([place]) 08/19/2018  ? Cervical cord compression with myelopathy ([place])   ? Postoperative pain   ? Diabetes mellitus type 2 in nonobese [facility])   ? Essential hypertension   ? Dyslipidemia   ? Drug induced constipation   ? Myelopathy ([place]) 08/12/2018  ? Degeneration of lumbar intervertebral disc 08/05/2018  ? Low back pain 08/05/2018  ? Neck pain 08/05/2018  ? ?Past Surgical History:  ?Procedure Laterality Date  ? ABDOMINAL AORTOGRAM W/LOWER EXTREMITY N/A 01/22/2019  ? Procedure: ABDOMINAL AORTOGRAM W/LOWER EXTREMITY;  Surgeon: Waynetta Sandy, MD;  Location: [place] [facility];  Service: Cardiovascular;  Laterality: N/A;  rt leg  ?  ABDOMINAL AORTOGRAM W/LOWER EXTREMITY N/A 10/26/2019  ? Procedure: ABDOMINAL AORTOGRAM W/LOWER EXTREMITY;  Surgeon: Waynetta Sandy, MD;  Location: [place] [facility];  Service: Cardiovascular;  Laterality: N/A;  Rt lower extermity   ? ABDOMINAL AORTOGRAM W/LOWER EXTREMITY Bilateral 09/26/2020  ? Procedure: ABDOMINAL AORTOGRAM W/LOWER EXTREMITY;  Surgeon: Waynetta Sandy, MD;  Location: [place] [facility];  Service: Cardiovascular;  Laterality: Bilateral;  ? ANTERIOR CERVICAL DECOMP/DISCECTOMY FUSION  08/12/2018  ? CERVICAL 3-4, CERVICAL 4-5, CERVICAL 5-6, CERVICAL 6-7 WITH INSTRUMENTATION AND ALLOGRAFT  ? ANTERIOR CERVICAL DECOMPRESSION/DISCECTOMY FUSION 4 LEVELS N/A 08/12/2018  ? Procedure: ANTERIOR CERVICAL DECOMPRESSION FUSION CERVICAL 3-4, CERVICAL 4-5, CERVICAL 5-6, CERVICAL 6-7 WITH INSTRUMENTATION AND ALLOGRAFT;  Surgeon: Phylliss Bob, MD;  Location: [place];  Service: Orthopedics;  Laterality: N/A;  ANTERIOR CERVICAL DECOMPRESSION FUSION CERVICAL 3-4, CERVICAL 4-5, CERVICAL 5-6, CERVICAL 6-7 WITH INSTRUMENTATION AND ALLOGRAFT  ? COLONOSCOPY W/ POLYPECTOMY    ? FEMORAL-POPLITEAL BYPASS GRAFT Right 01/27/2019  ? Procedure: right FEMORAL ENDARTERECTOMY, RIGHT  FEMORAL- BELOW THE KNEE POPLITEAL ARTERY BYPASS GRAFT using 65m propaten ringed gore graft, attempted right femoral to below knee popliteal artery bypass using right nonreversed great saphenous vein;  Surgeon: CWaynetta Sandy MD;  [place]  Service: Vascular;  Laterality: Right;  ? [place] ? "? side"  ? KNEE ARTHROSCOPY Right   ? PERIPHERAL VASCULAR ATHERECTOMY  10/26/2019  ? Procedure: PERIPHERAL VASCULAR ATHERECTOMY;  Surgeon: CDonzetta Matters  Georgia Dom, MD;  Location: [place] [facility];  Service: Cardiovascular;;  LASER  ? PERIPHERAL VASCULAR BALLOON ANGIOPLASTY Right 09/26/2020  ? Procedure: PERIPHERAL VASCULAR BALLOON ANGIOPLASTY;  Surgeon: Waynetta Sandy, MD;  Location: [place] [facility];  Service: Cardiovascular;  Laterality: Right;  SFA  FEMPOP BYPASS GRAFT  ? PILONIDAL CYST EXCISION    ? ?Current Outpatient Medications on File Prior to Visit  ?Medication Sig Dispense Refill  ? Accu-Chek Softclix Lancets lancets daily.    ? amLODipine (NORVASC) 10 MG tablet Take 10 mg by mouth daily.    ? aspirin EC 81 MG tablet Take 81 mg by mouth daily.    ? atenolol (TENORMIN) 50 MG tablet Take 50 mg by mouth daily.    ? atorvastatin (LIPITOR) 20 MG tablet Take 20 mg by mouth daily.  1  ? Blood Glucose Monitoring Suppl (ACCU-CHEK GUIDE) w/Device KIT See admin instructions.    ? cholecalciferol (VITAMIN D) 1000 units tablet Take 1 tablet (1,000 Units total) by mouth daily.    ? clopidogrel (PLAVIX) 75 MG tablet TAKE 1 TABLET BY MOUTH EVERY DAY 90 tablet 3  ? doxycycline (VIBRA-TABS) 100 MG tablet Take 100 mg by mouth 2 (two) times daily.    ? glimepiride (AMARYL) 4 MG tablet Take 1 tablet (4 mg total) by mouth every evening. (Patient taking differently: Take 4 mg by mouth daily with breakfast.)    ? hydrochlorothiazide (HYDRODIURIL) 25 MG tablet Take 25 mg by mouth daily.    ? lisinopril (ZESTRIL) 40 MG tablet Take 40 mg by mouth daily. Take 40 mg by mouth daily    ? Multiple Vitamins-Minerals (MULTIVITAMIN WITH MINERALS) tablet Take 1 tablet by mouth daily.    ? Naproxen (NAPROSYN PO) naproxen    ? ONETOUCH ULTRA test strip USE TO TEST FASTING SUGAR ONCE DAILY IN THE MORNING    ? pioglitazone (ACTOS) 15 MG tablet Take 15 mg by mouth daily.    ? pneumococcal 13-valent conjugate vaccine (PREVNAR 13) SUSP injection Prevnar 13 (PF) 0.5 mL intramuscular syringe ? INJECT O.5MLS AS ONE DOSE    ? triamcinolone cream (KENALOG) 0.1 % PLEASE SEE ATTACHED FOR DETAILED DIRECTIONS    ? ?No current facility-administered medications on file prior to visit.  ?  ?No Known Allergies ?Social History  ? ?Occupational History  ? Not on file  ?Tobacco Use  ? Smoking status: Former  ?  Packs/day: 1.00  ?  Years: 30.00  ?  Pack  years: 30.00  ?  Types: Cigarettes  ?  Passive exposure: Never  ? Smokeless tobacco: Never  ? Tobacco comments:  ?  quit in late 1990's  ?Vaping Use  ? Vaping Use: Never used  ?Substance and Sexual Activity  ? Alcohol use: Not Currently  ?  Comment: 08/12/2018 "nothing in the 2000s"  ? Drug use: Never  ? Sexual activity: Not Currently  ? ?Family History  ?Problem Relation Age of Onset  ? Cancer Mother   ? Stroke Father   ?     multiple  ? ?Immunization History  ?Administered Date(s) Administered  ? PFIZER(Purple Top)SARS-COV-2 Vaccination 11/16/2019, 12/15/2019  ? Pneumococcal Conjugate-13 08/31/2017  ? Pneumococcal Polysaccharide-23 08/23/2018  ?  ? ?Review of Systems: Negative except as noted in the HPI.  ? ?Objective: ?There were no vitals filed for this visit. ? ?Brandon Collins is a pleasant 71 y.o. male in NAD. AAO X 3. ? ?Vascular Examination: ?CFT <3 seconds b/l LE. Palpable DP pulse(s)  b/l LE. Palpable PT pulse(s) b/l LE. Pedal hair absent. No pain with calf compression b/l. +1 pitting edema left lower extremity. +2 pitting edema right lower extremity. ? ?Dermatological Examination: ?No open wounds b/l LE. No interdigital macerations noted b/l LE. Toenails 1-5 bilaterally elongated, discolored, dystrophic, thickened, and crumbly with subungual debris and tenderness to dorsal palpation. No hyperkeratotic nor porokeratotic lesions present on today's visit. ? ?Neurological Examination: ?Protective sensation diminished with 10g monofilament b/l. Vibratory sensation intact b/l. ? ?Musculoskeletal Examination: ?Muscle strength 5/5 to all lower extremity muscle groups bilaterally. No pain, crepitus or joint limitation noted with ROM bilateral LE. Pes planus deformity noted bilateral LE. ? ?Footwear Assessment: ?Does the patient wear appropriate shoes? Yes. ?Does the patient need inserts/orthotics? Yes. ? ?Assessment: ?1. Pain due to onychomycosis of toenails of both feet   ?2. Pes planus of both feet   ?3. Type II  diabetes mellitus with peripheral circulatory disorder (HCC)   ?4. Encounter for diabetic foot exam ([place])   ?  ?ADA Risk Categorization: ? ?High Risk  ?Patient has one or more of the following: ?Loss of protect

## 2022-04-05 ENCOUNTER — Ambulatory Visit: Payer: Medicare HMO | Admitting: Podiatry

## 2022-04-05 ENCOUNTER — Encounter: Payer: Self-pay | Admitting: Podiatry

## 2022-04-05 DIAGNOSIS — M79674 Pain in right toe(s): Secondary | ICD-10-CM | POA: Diagnosis not present

## 2022-04-05 DIAGNOSIS — M79675 Pain in left toe(s): Secondary | ICD-10-CM

## 2022-04-05 DIAGNOSIS — B351 Tinea unguium: Secondary | ICD-10-CM

## 2022-04-05 DIAGNOSIS — S90412A Abrasion, left great toe, initial encounter: Secondary | ICD-10-CM

## 2022-04-05 DIAGNOSIS — E1151 Type 2 diabetes mellitus with diabetic peripheral angiopathy without gangrene: Secondary | ICD-10-CM | POA: Diagnosis not present

## 2022-04-05 DIAGNOSIS — L84 Corns and callosities: Secondary | ICD-10-CM | POA: Diagnosis not present

## 2022-04-14 NOTE — Progress Notes (Signed)
  Subjective:  Patient ID: Brandon Collins, male    DOB: [birth year],  MRN: [id number]  Brandon Collins presents to clinic today for at risk foot care. Pt has h/o NIDDM with PAD and painful thick toenails that are difficult to trim. Pain interferes with ambulation. Aggravating factors include wearing enclosed shoe gear. Pain is relieved with periodic professional debridement.  Patient states blood glucose was 86 mg/dl  one week ago .    Last A1c was 6.8%.  Patient states he used an Education officer, environmental on his left great toe and developed an abrasion.  PCP is Slatosky, Excell Seltzer., MD , and last visit was  July 2023.  No Known Allergies  Review of Systems: Negative except as noted in the HPI.  Objective: No changes noted in today's physical examination. There were no vitals filed for this visit.  Brandon Collins is a pleasant 71 y.o. male in NAD. AAO X 3.  Vascular Examination: CFT <3 seconds b/l LE. Palpable DP pulse(s) b/l LE. Palpable PT pulse(s) b/l LE. Pedal hair absent. No pain with calf compression b/l. Trace edema b/l LE.  Dermatological Examination: No open wounds b/l LE. No interdigital macerations noted b/l LE. Toenails 1-5 bilaterally elongated, discolored, dystrophic, thickened, and crumbly with subungual debris and tenderness to dorsal palpation.   Hyperkeratotic lesion(s) submet head 5 b/l.  No erythema, no edema, no drainage, no fluctuance. Healing abrasion(s) with intact scab noted left great toe. No erythema, no edema, no drainage, no fluctuance.  Neurological Examination: Protective sensation diminished with 10g monofilament b/l. Vibratory sensation intact b/l.  Musculoskeletal Examination: Muscle strength 5/5 to all lower extremity muscle groups bilaterally. No pain, crepitus or joint limitation noted with ROM bilateral LE. Pes planus deformity noted bilateral LE. Assessment/Plan: 1. Pain due to onychomycosis of toenails of both feet   2. Callus   3. Abrasion of skin of left great toe    4. Type II diabetes mellitus with peripheral circulatory disorder (HCC)     -Examined patient. -Patient/POA educated on dangers of using sharp instrumentation on toes/feet. Recommended continued professional foot care in presence of diabetes and PAD. Patient/POA relates understanding. -Patient to continue soft, supportive shoe gear daily. -Toenails 1-5 b/l were debrided in length and girth with sterile nail nippers and dremel without iatrogenic bleeding.  -Callus(es) submet head 5 b/l pared utilizing sterile scalpel blade without complication or incident. Total number debrided =2. -Patient to apply Neosporin to left great toe once daily. -Patient/POA to call should there be question/concern in the interim.   Return in about 3 months (around 07/06/2022).  Freddie Breech, DPM

## 2022-07-19 ENCOUNTER — Encounter: Payer: Self-pay | Admitting: Podiatry

## 2022-07-19 ENCOUNTER — Ambulatory Visit: Payer: Medicare HMO | Admitting: Podiatry

## 2022-07-19 DIAGNOSIS — B351 Tinea unguium: Secondary | ICD-10-CM | POA: Diagnosis not present

## 2022-07-19 DIAGNOSIS — L84 Corns and callosities: Secondary | ICD-10-CM | POA: Diagnosis not present

## 2022-07-19 DIAGNOSIS — M79674 Pain in right toe(s): Secondary | ICD-10-CM | POA: Diagnosis not present

## 2022-07-19 DIAGNOSIS — M79675 Pain in left toe(s): Secondary | ICD-10-CM

## 2022-07-19 DIAGNOSIS — E1151 Type 2 diabetes mellitus with diabetic peripheral angiopathy without gangrene: Secondary | ICD-10-CM

## 2022-07-19 NOTE — Progress Notes (Signed)
  Subjective:  Patient ID: Brandon Collins, male    DOB: Apr 06, 1951,  MRN: [id number]  Brandon Collins presents to clinic today for at risk foot care. Pt has h/o NIDDM with PAD and painful elongated mycotic toenails 1-5 bilaterally which are tender when wearing enclosed shoe gear. Pain is relieved with periodic professional debridement.. Chief Complaint  Patient presents with   Nail Problem    Diabetic foot care BS- did not check today A1C-Do not know sure PCP-Slatosky,John  PCP VST-02/2022   New problem(s): None.   PCP is Slatosky, Marshall Cork., MD.  No Known Allergies  Review of Systems: Negative except as noted in the HPI.  Objective: No changes noted in today's physical examination.  Brandon Collins is a pleasant 71 y.o. male in NAD. AAO x 3.  Vascular Examination: CFT <3 seconds b/l LE. Palpable DP pulse(s) b/l LE. Palpable PT pulse(s) b/l LE. Pedal hair absent. No pain with calf compression b/l. Trace edema b/l LE.  Dermatological Examination: No open wounds b/l LE. No interdigital macerations noted b/l LE. Toenails 1-5 bilaterally elongated, discolored, dystrophic, thickened, and crumbly with subungual debris and tenderness to dorsal palpation.   Hyperkeratotic lesion(s) submet head 5 b/l.  No erythema, no edema, no drainage, no fluctuance. Healing abrasion(s) with intact scab noted left great toe. No erythema, no edema, no drainage, no fluctuance.  Neurological Examination: Protective sensation diminished with 10g monofilament b/l. Vibratory sensation intact b/l.  Musculoskeletal Examination: Muscle strength 5/5 to all lower extremity muscle groups bilaterally. No pain, crepitus or joint limitation noted with ROM bilateral LE. Pes planus deformity noted bilateral LE.  Assessment/Plan: 1. Pain due to onychomycosis of toenails of both feet   2. Callus   3. Type II diabetes mellitus with peripheral circulatory disorder (HCC)     No orders of the defined types were placed in this  encounter.   -Patient was evaluated and treated. All patient's and/or POA's questions/concerns answered on today's visit. -Continue supportive shoe gear daily. -Mycotic toenails 1-5 bilaterally were debrided in length and girth with sterile nail nippers and dremel. Pinpoint bleeding of R 2nd toe and R 3rd toe addressed with Lumicain Hemostatic Solution, cleansed with alcohol. triple antibiotic ointment applied. No further treatment required by patient/caregiver. -Callus(es) submet head 5 b/l pared utilizing sterile scalpel blade without complication or incident. Total number debrided =2. -Patient/POA to call should there be question/concern in the interim.   Return in about 3 months (around 10/19/2022).  Marzetta Board, DPM

## 2022-09-01 ENCOUNTER — Other Ambulatory Visit: Payer: Self-pay | Admitting: Vascular Surgery

## 2022-09-14 ENCOUNTER — Ambulatory Visit (HOSPITAL_COMMUNITY)
Admission: RE | Admit: 2022-09-14 | Discharge: 2022-09-14 | Disposition: A | Discharge status: Home / Self Care | Payer: Medicare HMO | Source: Ambulatory Visit | Attending: Physician Assistant | Admitting: Physician Assistant

## 2022-09-14 ENCOUNTER — Ambulatory Visit (INDEPENDENT_AMBULATORY_CARE_PROVIDER_SITE_OTHER)
Admission: RE | Admit: 2022-09-14 | Discharge: 2022-09-14 | Disposition: A | Discharge status: Home / Self Care | Payer: Medicare HMO | Source: Ambulatory Visit | Attending: Physician Assistant | Admitting: Physician Assistant

## 2022-09-14 DIAGNOSIS — L97311 Non-pressure chronic ulcer of right ankle limited to breakdown of skin: Secondary | ICD-10-CM | POA: Diagnosis present

## 2022-09-14 DIAGNOSIS — I739 Peripheral vascular disease, unspecified: Secondary | ICD-10-CM | POA: Diagnosis present

## 2022-09-14 DIAGNOSIS — R600 Localized edema: Secondary | ICD-10-CM | POA: Diagnosis present

## 2022-09-21 ENCOUNTER — Encounter: Payer: Self-pay | Admitting: Physician Assistant

## 2022-09-21 ENCOUNTER — Ambulatory Visit (INDEPENDENT_AMBULATORY_CARE_PROVIDER_SITE_OTHER): Payer: Medicare HMO | Admitting: Physician Assistant

## 2022-09-21 VITALS — BP 189/77 | HR 55 | Temp 98.5°F | Resp 20 | Ht 72.0 in | Wt 260.0 lb

## 2022-09-21 DIAGNOSIS — I739 Peripheral vascular disease, unspecified: Secondary | ICD-10-CM

## 2022-09-21 NOTE — Progress Notes (Signed)
Established Previous Bypass   History of Present Illness   Brandon Collins is a 72 y.o. (1951-06-19) male who presents for surveillance of previous bypass.  He has a history of right common femoral endarterectomy and femoral to below-knee popliteal artery bypass with PTFE by Dr. [name] on 01/27/2019.  In February 2021 he required laser atherectomy of the right common femoral artery and balloon angioplasty of the proximal graft to treat proximal anastomosis stenosis with decreased velocities distally.  In February 2022 he required repeat balloon angioplasty of the proximal graft for restenosis of the proximal anastomosis with decreased velocities distally. Over his last few visits with our office, he started developing recurrent inflow and proximal anastomosis of the bypass however we decided to monitor this since his bypass velocities were not threatened and he was asymptomatic.  At follow-up today, the patient states that he is doing well.  He is still taking his aspirin, Plavix, and statin.  He denies any rest pain, claudication, nonhealing wounds.  He denies any recent changes to his health.  He still experiences bilateral lower extremity swelling, which is managed with his compression stockings.  Current Outpatient Medications  Medication Sig Dispense Refill   Accu-Chek Softclix Lancets lancets daily.     amLODipine (NORVASC) 10 MG tablet Take 10 mg by mouth daily.     aspirin EC 81 MG tablet Take 81 mg by mouth daily.     atenolol (TENORMIN) 50 MG tablet Take 50 mg by mouth daily.     atorvastatin (LIPITOR) 20 MG tablet Take 1 tablet by mouth at bedtime.     Blood Glucose Monitoring Suppl (ACCU-CHEK GUIDE) w/Device KIT See admin instructions.     cholecalciferol (VITAMIN D) 1000 units tablet Take 1 tablet (1,000 Units total) by mouth daily.     cloNIDine (CATAPRES) 0.1 MG tablet      clopidogrel (PLAVIX) 75 MG tablet TAKE 1 TABLET BY MOUTH EVERY DAY 90 tablet 3   doxycycline (VIBRA-TABS) 100  MG tablet Take 100 mg by mouth 2 (two) times daily.     glimepiride (AMARYL) 4 MG tablet Take 1 tablet (4 mg total) by mouth every evening. (Patient taking differently: Take 4 mg by mouth daily with breakfast.)     hydrochlorothiazide (HYDRODIURIL) 25 MG tablet Take 25 mg by mouth daily.     lisinopril (ZESTRIL) 40 MG tablet Take 40 mg by mouth daily. Take 40 mg by mouth daily     metFORMIN (GLUCOPHAGE-XR) 500 MG 24 hr tablet      Multiple Vitamins-Minerals (MULTIVITAMIN WITH MINERALS) tablet Take 1 tablet by mouth daily.     mupirocin ointment (BACTROBAN) 2 % Apply topically daily.     Naproxen (NAPROSYN PO) naproxen     ONETOUCH ULTRA test strip USE TO TEST FASTING SUGAR ONCE DAILY IN THE MORNING     pioglitazone (ACTOS) 15 MG tablet Take 15 mg by mouth daily.     pneumococcal 13-valent conjugate vaccine (PREVNAR 13) SUSP injection Prevnar 13 (PF) 0.5 mL intramuscular syringe  INJECT O.5MLS AS ONE DOSE     sulfamethoxazole-trimethoprim (BACTRIM DS) 800-160 MG tablet Take 1 tablet by mouth 2 (two) times daily.     triamcinolone cream (KENALOG) 0.1 % PLEASE SEE ATTACHED FOR DETAILED DIRECTIONS     No current facility-administered medications for this visit.    REVIEW OF SYSTEMS (negative unless checked):   Cardiac:  []  Chest pain or chest pressure? []  Shortness of breath upon activity? []  Shortness of breath  when lying flat? [] Irregular heart rhythm?  Vascular:  [] Pain in calf, thigh, or hip brought on by walking? [] Pain in feet at night that wakes you up from your sleep? [] Blood clot in your veins? [x] Leg swelling?  Pulmonary:  [] Oxygen at home? [] Productive cough? [] Wheezing?  Neurologic:  [] Sudden weakness in arms or legs? [] Sudden numbness in arms or legs? [] Sudden onset of difficult speaking or slurred speech? [] Temporary loss of vision in one eye? [] Problems with dizziness?  Gastrointestinal:  [] Blood in stool? [] Vomited blood?  Genitourinary:  []  Burning when urinating? [] Blood in urine?  Psychiatric:  [] Major depression  Hematologic:  [] Bleeding problems? [] Problems with blood clotting?  Dermatologic:  [] Rashes or ulcers?  Constitutional:  [] Fever or chills?  Ear/Nose/Throat:  [] Change in hearing? [] Nose bleeds? [] Sore throat?  Musculoskeletal:  [] Back pain? [] Joint pain? [] Muscle pain?   Physical Examination   Vitals:   09/21/22 1248  BP: (!) 189/77  Pulse: (!) 55  Resp: 20  Temp: 98.5 F (36.9 C)  TempSrc: Temporal  SpO2: 100%  Weight: 260 lb (117.9 kg)  Height: 6' (1.829 m)   Body mass index is 35.26 kg/m.  General:  WDWN in NAD; vital signs documented above Gait: Not observed HENT: WNL, normocephalic Pulmonary: Normal, nonlabored breathing Cardiac: Regular rate and rhythm Abdomen: soft, NT, no masses Skin: without rashes Vascular Exam/Pulses: Right lower extremity DP/PT Doppler signals Extremities: without ischemic changes, without gangrene , without cellulitis; without open wounds;  Musculoskeletal: no muscle wasting or atrophy  Neurologic: A&O X 3;  No focal weakness or paresthesias are detected Psychiatric:  The pt has Normal affect.  Non-Invasive Vascular Imaging ABI (09/14/2022) R ABI: [place] L ABI: 0.57 ([place])  RLE Bypass Duplex (09/14/2022) There is monophasic inflow to the bypass graft.  There is monophasic flow throughout the bypass graft and monophasic outflow.  There is greater than 70% stenosis at the proximal anastomosis with PSV of 477.  There is also turbulent and elevated velocities at the bypass inflow   Medical Decision Making   Brandon Collins is a 71 y.o. male who presents for surveillance of established bypass graft  Based on the patient's vascular studies, his ABIs are unchanged and noncompressible since his last visit.  His TBI's on the right have slightly decreased. His bypass graft duplex demonstrates continued stenosis of the proximal anastomosis with PSV  of 477 today.  There is also elevated velocities at the inflow of the bypass graft.  Previously, there was biphasic and triphasic flow throughout the bypass graft, however today there is only monophasic flow throughout the entire bypass graft.  There are no decreased velocities currently suggesting a nonthreatened bypass graft He is currently asymptomatic of claudication, rest pain, nonhealing wounds of the lower extremities He is still taking his aspirin, Plavix and statin.  Given that his bypass stenosis is worsening and affecting flow of the bypass graft, I have suggested that he undergo repeat right lower extremity angiogram with possible intervention in the next 1-2 weeks with Dr.Cain   Kora Groom [name] [facility] Office: [phone]  Clinic MD: [place] 

## 2022-09-21 NOTE — H&P (View-Only) (Signed)
Established Previous Bypass   History of Present Illness   Brandon Collins is a 72 y.o. (1951-06-19) male who presents for surveillance of previous bypass.  He has a history of right common femoral endarterectomy and femoral to below-knee popliteal artery bypass with PTFE by Dr. [name] on 01/27/2019.  In February 2021 he required laser atherectomy of the right common femoral artery and balloon angioplasty of the proximal graft to treat proximal anastomosis stenosis with decreased velocities distally.  In February 2022 he required repeat balloon angioplasty of the proximal graft for restenosis of the proximal anastomosis with decreased velocities distally. Over his last few visits with our office, he started developing recurrent inflow and proximal anastomosis of the bypass however we decided to monitor this since his bypass velocities were not threatened and he was asymptomatic.  At follow-up today, the patient states that he is doing well.  He is still taking his aspirin, Plavix, and statin.  He denies any rest pain, claudication, nonhealing wounds.  He denies any recent changes to his health.  He still experiences bilateral lower extremity swelling, which is managed with his compression stockings.  Current Outpatient Medications  Medication Sig Dispense Refill   Accu-Chek Softclix Lancets lancets daily.     amLODipine (NORVASC) 10 MG tablet Take 10 mg by mouth daily.     aspirin EC 81 MG tablet Take 81 mg by mouth daily.     atenolol (TENORMIN) 50 MG tablet Take 50 mg by mouth daily.     atorvastatin (LIPITOR) 20 MG tablet Take 1 tablet by mouth at bedtime.     Blood Glucose Monitoring Suppl (ACCU-CHEK GUIDE) w/Device KIT See admin instructions.     cholecalciferol (VITAMIN D) 1000 units tablet Take 1 tablet (1,000 Units total) by mouth daily.     cloNIDine (CATAPRES) 0.1 MG tablet      clopidogrel (PLAVIX) 75 MG tablet TAKE 1 TABLET BY MOUTH EVERY DAY 90 tablet 3   doxycycline (VIBRA-TABS) 100  MG tablet Take 100 mg by mouth 2 (two) times daily.     glimepiride (AMARYL) 4 MG tablet Take 1 tablet (4 mg total) by mouth every evening. (Patient taking differently: Take 4 mg by mouth daily with breakfast.)     hydrochlorothiazide (HYDRODIURIL) 25 MG tablet Take 25 mg by mouth daily.     lisinopril (ZESTRIL) 40 MG tablet Take 40 mg by mouth daily. Take 40 mg by mouth daily     metFORMIN (GLUCOPHAGE-XR) 500 MG 24 hr tablet      Multiple Vitamins-Minerals (MULTIVITAMIN WITH MINERALS) tablet Take 1 tablet by mouth daily.     mupirocin ointment (BACTROBAN) 2 % Apply topically daily.     Naproxen (NAPROSYN PO) naproxen     ONETOUCH ULTRA test strip USE TO TEST FASTING SUGAR ONCE DAILY IN THE MORNING     pioglitazone (ACTOS) 15 MG tablet Take 15 mg by mouth daily.     pneumococcal 13-valent conjugate vaccine (PREVNAR 13) SUSP injection Prevnar 13 (PF) 0.5 mL intramuscular syringe  INJECT O.5MLS AS ONE DOSE     sulfamethoxazole-trimethoprim (BACTRIM DS) 800-160 MG tablet Take 1 tablet by mouth 2 (two) times daily.     triamcinolone cream (KENALOG) 0.1 % PLEASE SEE ATTACHED FOR DETAILED DIRECTIONS     No current facility-administered medications for this visit.    REVIEW OF SYSTEMS (negative unless checked):   Cardiac:  []  Chest pain or chest pressure? []  Shortness of breath upon activity? []  Shortness of breath  when lying flat? []  Irregular heart rhythm?  Vascular:  []  Pain in calf, thigh, or hip brought on by walking? []  Pain in feet at night that wakes you up from your sleep? []  Blood clot in your veins? [x]  Leg swelling?  Pulmonary:  []  Oxygen at home? []  Productive cough? []  Wheezing?  Neurologic:  []  Sudden weakness in arms or legs? []  Sudden numbness in arms or legs? []  Sudden onset of difficult speaking or slurred speech? []  Temporary loss of vision in one eye? []  Problems with dizziness?  Gastrointestinal:  []  Blood in stool? []  Vomited blood?  Genitourinary:  []   Burning when urinating? []  Blood in urine?  Psychiatric:  []  Major depression  Hematologic:  []  Bleeding problems? []  Problems with blood clotting?  Dermatologic:  []  Rashes or ulcers?  Constitutional:  []  Fever or chills?  Ear/Nose/Throat:  []  Change in hearing? []  Nose bleeds? []  Sore throat?  Musculoskeletal:  []  Back pain? []  Joint pain? []  Muscle pain?   Physical Examination   Vitals:   09/21/22 1248  BP: (!) 189/77  Pulse: (!) 55  Resp: 20  Temp: 98.5 F (36.9 C)  TempSrc: Temporal  SpO2: 100%  Weight: 260 lb (117.9 kg)  Height: 6' (1.829 m)   Body mass index is 35.26 kg/m.  General:  WDWN in NAD; vital signs documented above Gait: Not observed HENT: WNL, normocephalic Pulmonary: Normal, nonlabored breathing Cardiac: Regular rate and rhythm Abdomen: soft, NT, no masses Skin: without rashes Vascular Exam/Pulses: Right lower extremity DP/PT Doppler signals Extremities: without ischemic changes, without gangrene , without cellulitis; without open wounds;  Musculoskeletal: no muscle wasting or atrophy  Neurologic: A&O X 3;  No focal weakness or paresthesias are detected Psychiatric:  The pt has Normal affect.  Non-Invasive Vascular Imaging ABI (09/14/2022) R ABI: [place] L ABI: 0.57 ([place])  RLE Bypass Duplex (09/14/2022) There is monophasic inflow to the bypass graft.  There is monophasic flow throughout the bypass graft and monophasic outflow.  There is greater than 70% stenosis at the proximal anastomosis with PSV of 477.  There is also turbulent and elevated velocities at the bypass inflow   Medical Decision Making   Brandon Collins is a 72 y.o. male who presents for surveillance of established bypass graft  Based on the patient's vascular studies, his ABIs are unchanged and noncompressible since his last visit.  His TBI's on the right have slightly decreased. His bypass graft duplex demonstrates continued stenosis of the proximal anastomosis with PSV  of 477 today.  There is also elevated velocities at the inflow of the bypass graft.  Previously, there was biphasic and triphasic flow throughout the bypass graft, however today there is only monophasic flow throughout the entire bypass graft.  There are no decreased velocities currently suggesting a nonthreatened bypass graft He is currently asymptomatic of claudication, rest pain, nonhealing wounds of the lower extremities He is still taking his aspirin, Plavix and statin.  Given that his bypass stenosis is worsening and affecting flow of the bypass graft, I have suggested that he undergo repeat right lower extremity angiogram with possible intervention in the next 1-2 weeks with Dr.Cain   Vicente Serene [name] [facility] Office: [facility] MD: Virl Cagey

## 2022-09-24 ENCOUNTER — Other Ambulatory Visit: Payer: Self-pay

## 2022-09-24 DIAGNOSIS — T82858A Stenosis of vascular prosthetic devices, implants and grafts, initial encounter: Secondary | ICD-10-CM

## 2022-10-08 ENCOUNTER — Encounter (HOSPITAL_COMMUNITY): Payer: Self-pay | Admitting: Vascular Surgery

## 2022-10-08 ENCOUNTER — Ambulatory Visit (HOSPITAL_COMMUNITY)
Admission: RE | Admit: 2022-10-08 | Discharge: 2022-10-08 | Disposition: A | Discharge status: Home / Self Care | Payer: Medicare HMO | Attending: Vascular Surgery | Admitting: Vascular Surgery

## 2022-10-08 ENCOUNTER — Other Ambulatory Visit: Payer: Self-pay

## 2022-10-08 ENCOUNTER — Encounter (HOSPITAL_COMMUNITY)
Admission: RE | Disposition: A | Discharge status: Home / Self Care | Payer: Self-pay | Source: Home / Self Care | Attending: Vascular Surgery

## 2022-10-08 DIAGNOSIS — Z7982 Long term (current) use of aspirin: Secondary | ICD-10-CM | POA: Diagnosis not present

## 2022-10-08 DIAGNOSIS — Z79899 Other long term (current) drug therapy: Secondary | ICD-10-CM | POA: Diagnosis not present

## 2022-10-08 DIAGNOSIS — Z7902 Long term (current) use of antithrombotics/antiplatelets: Secondary | ICD-10-CM | POA: Insufficient documentation

## 2022-10-08 DIAGNOSIS — Y832 Surgical operation with anastomosis, bypass or graft as the cause of abnormal reaction of the patient, or of later complication, without mention of misadventure at the time of the procedure: Secondary | ICD-10-CM | POA: Diagnosis not present

## 2022-10-08 DIAGNOSIS — I70233 Atherosclerosis of native arteries of right leg with ulceration of ankle: Secondary | ICD-10-CM | POA: Diagnosis not present

## 2022-10-08 DIAGNOSIS — I70211 Atherosclerosis of native arteries of extremities with intermittent claudication, right leg: Secondary | ICD-10-CM | POA: Insufficient documentation

## 2022-10-08 DIAGNOSIS — T82858A Stenosis of vascular prosthetic devices, implants and grafts, initial encounter: Secondary | ICD-10-CM | POA: Diagnosis not present

## 2022-10-08 HISTORY — PX: ABDOMINAL AORTOGRAM W/LOWER EXTREMITY: CATH118223

## 2022-10-08 HISTORY — PX: PERIPHERAL VASCULAR INTERVENTION: CATH118257

## 2022-10-08 LAB — POCT I-STAT, CHEM 8
BUN: 25 mg/dL — ABNORMAL HIGH (ref 8–23)
Calcium, Ion: 1.22 mmol/L (ref 1.15–1.40)
Chloride: 103 mmol/L (ref 98–111)
Creatinine, Ser: 0.9 mg/dL (ref 0.61–1.24)
Glucose, Bld: 76 mg/dL (ref 70–99)
HCT: 39 % (ref 39.0–52.0)
Hemoglobin: 13.3 g/dL (ref 13.0–17.0)
Potassium: 3.8 mmol/L (ref 3.5–5.1)
Sodium: 141 mmol/L (ref 135–145)
TCO2: 26 mmol/L (ref 22–32)

## 2022-10-08 SURGERY — ABDOMINAL AORTOGRAM W/LOWER EXTREMITY
Anesthesia: LOCAL | Laterality: Right

## 2022-10-08 MED ORDER — HEPARIN (PORCINE) IN NACL 1000-0.9 UT/500ML-% IV SOLN
INTRAVENOUS | Status: AC
  Filled 2022-10-08: qty 1000

## 2022-10-08 MED ORDER — ACETAMINOPHEN 325 MG PO TABS: 650.0000 mg | ORAL_TABLET | ORAL | Status: DC | PRN

## 2022-10-08 MED ORDER — HEPARIN SODIUM (PORCINE) 1000 UNIT/ML IJ SOLN
INTRAMUSCULAR | Status: AC
  Filled 2022-10-08: qty 10

## 2022-10-08 MED ORDER — SODIUM CHLORIDE 0.9% FLUSH: 3.0000 mL | Freq: Two times a day (BID) | INTRAVENOUS | Status: DC

## 2022-10-08 MED ORDER — LIDOCAINE HCL (PF) 1 % IJ SOLN
INTRAMUSCULAR | Status: AC
  Filled 2022-10-08: qty 30

## 2022-10-08 MED ORDER — ONDANSETRON HCL 4 MG/2ML IJ SOLN: 4.0000 mg | Freq: Four times a day (QID) | INTRAMUSCULAR | Status: DC | PRN

## 2022-10-08 MED ORDER — OXYCODONE HCL 5 MG PO TABS: 5.0000 mg | ORAL_TABLET | ORAL | Status: DC | PRN

## 2022-10-08 MED ORDER — HEPARIN SODIUM (PORCINE) 1000 UNIT/ML IJ SOLN
INTRAMUSCULAR | Status: DC | PRN
  Administered 2022-10-08: 5000 [IU] via INTRAVENOUS

## 2022-10-08 MED ORDER — SODIUM CHLORIDE 0.9 % IV SOLN: 250.0000 mL | INTRAVENOUS | Status: DC | PRN

## 2022-10-08 MED ORDER — FENTANYL CITRATE (PF) 100 MCG/2ML IJ SOLN
INTRAMUSCULAR | Status: DC | PRN
  Administered 2022-10-08: 50 ug via INTRAVENOUS

## 2022-10-08 MED ORDER — SODIUM CHLORIDE 0.9% FLUSH: 3.0000 mL | INTRAVENOUS | Status: DC | PRN

## 2022-10-08 MED ORDER — SODIUM CHLORIDE 0.9 % IV SOLN: INTRAVENOUS | Status: DC

## 2022-10-08 MED ORDER — LIDOCAINE HCL (PF) 1 % IJ SOLN
INTRAMUSCULAR | Status: DC | PRN
  Administered 2022-10-08: 15 mL

## 2022-10-08 MED ORDER — FENTANYL CITRATE (PF) 100 MCG/2ML IJ SOLN
INTRAMUSCULAR | Status: AC
  Filled 2022-10-08: qty 2

## 2022-10-08 MED ORDER — LABETALOL HCL 5 MG/ML IV SOLN: 10.0000 mg | INTRAVENOUS | Status: DC | PRN

## 2022-10-08 MED ORDER — IODIXANOL 320 MG/ML IV SOLN
INTRAVENOUS | Status: DC | PRN
  Administered 2022-10-08: 120 mL

## 2022-10-08 MED ORDER — HYDRALAZINE HCL 20 MG/ML IJ SOLN: 5.0000 mg | INTRAMUSCULAR | Status: DC | PRN

## 2022-10-08 MED ORDER — MIDAZOLAM HCL 2 MG/2ML IJ SOLN
INTRAMUSCULAR | Status: AC
  Filled 2022-10-08: qty 2

## 2022-10-08 MED ORDER — HEPARIN (PORCINE) IN NACL 1000-0.9 UT/500ML-% IV SOLN
INTRAVENOUS | Status: DC | PRN
  Administered 2022-10-08 (×2): 500 mL

## 2022-10-08 MED ORDER — MIDAZOLAM HCL 2 MG/2ML IJ SOLN
INTRAMUSCULAR | Status: DC | PRN
  Administered 2022-10-08: 1 mg via INTRAVENOUS

## 2022-10-08 MED ORDER — SODIUM CHLORIDE 0.9 % IV SOLN
INTRAVENOUS | Status: DC
Start: 2022-10-08 — End: 2022-10-08

## 2022-10-08 SURGICAL SUPPLY — 19 items
BALLN MUSTANG 6.0X40 135 (BALLOONS) ×2
BALLOON MUSTANG 6.0X40 135 (BALLOONS) IMPLANT
CATH ANGIO 5F BER2 65CM (CATHETERS) IMPLANT
CATH OMNI FLUSH 5F 65CM (CATHETERS) IMPLANT
CLOSURE MYNX CONTROL 6F/7F (Vascular Products) IMPLANT
DEVICE TORQUE .025-.038 (MISCELLANEOUS) IMPLANT
GLIDEWIRE ANGLED SS 035X260CM (WIRE) IMPLANT
KIT ENCORE 26 ADVANTAGE (KITS) IMPLANT
KIT MICROPUNCTURE NIT STIFF (SHEATH) IMPLANT
KIT PV (KITS) ×2 IMPLANT
SHEATH CATAPULT 6F 45 RDC (SHEATH) IMPLANT
SHEATH PINNACLE 5F 10CM (SHEATH) IMPLANT
SHEATH PINNACLE 6F 10CM (SHEATH) IMPLANT
STENT ELUVIA 6X40X130 (Permanent Stent) IMPLANT
SYR MEDRAD MARK V 150ML (SYRINGE) IMPLANT
TRANSDUCER W/STOPCOCK (MISCELLANEOUS) ×2 IMPLANT
TRAY PV CATH (CUSTOM PROCEDURE TRAY) ×2 IMPLANT
WIRE BENTSON .035X145CM (WIRE) IMPLANT
WIRE ROSEN-J .035X260CM (WIRE) IMPLANT

## 2022-10-08 NOTE — Op Note (Signed)
    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 [name], MD Procedure Performed: 1.  Ultrasound-guided can lesion left common femoral artery 2.  Aortogram with bilateral lower extremity runoff 3.  Stent of proximal femoropopliteal PTFE graft with 6 x 40 mm Eluvia 4.  Mynx device closure left common femoral artery 5.  Moderate sedation with fentanyl and Versed for 36 minutes  Indications: 72 year old male with history of a right femoropopliteal bypass for wound on his ankle which subsequently healed.  He was brought back and underwent laser arthrectomy with drug-coated balloon angioplasty of an in-graft stenosis and the graft stenosis appears to have recurred.  He is now indicated for angiography with possible intervention.  Findings: Aorta and iliac segments are free of flow-limiting stenosis.  On the right side the bypass graft is patent at the anastomosis and 2 cm after this there is an 80% stenosis which was stented to 0% residual.  The graft was then patent to the below-knee popliteal artery which appears to have dominant runoff via the posterior tibial artery.  On the left side there is significant common femoral disease we were able to place a minx device.  The SFA occludes shortly after the takeoff and he reconstitutes an above-the-knee popliteal artery but with severe disease at the level of the knee and then below the knee he has three-vessel runoff to the level of the ankle.   Procedure:  The patient was identified in the holding area and taken to room 8.  The patient was then placed supine on the table and prepped and draped in the usual sterile fashion.  A time out was called.  Ultrasound was used to evaluate the left common femoral artery.  There was some disease there.  The area was  anesthetized 1% lidocaine cannulated with micropuncture and a 5 by wire and a sheath.  An image was saved to the permanent record.  Concomitantly we administered fentanyl and Versed and his vital signs were monitored by bedside nursing throughout the case.  Bentson wires placed followed by 5 French sheath and Omni catheter to the level of L1 and aortogram was performed followed by bilateral lower extremity runoff.  With the above findings we crossed the bifurcation using a Bentson wire and Omni catheter placed a Rosen wire followed by a long 6 French sheath into the right common femoral artery and the patient was given 5000 units of heparin.  We crossed the stenosed area using a Rosen wire and confirmed intraluminal access and primarily stented with a 6 x 40 mm drug-eluting stent and then postdilated this with 6 mm balloon.  Completion demonstrated no residual stenosis.  Satisfied with this we exchanged for a short 6 [place] sheath deployed a minx device.  He tolerated procedure without immediate complication.   Contrast: 120cc  Jeannelle Wiens C. [name], MD [facility] Office: [phone] Pager: [phone]

## 2022-10-08 NOTE — Interval H&P Note (Signed)
History and Physical Interval Note:  10/08/2022 9:37 AM  [name]  has presented today for surgery, with the diagnosis of stenosis of bypass graft.  The various methods of treatment have been discussed with the patient and family. After consideration of risks, benefits and other options for treatment, the patient has consented to  Procedure(s): ABDOMINAL AORTOGRAM W/LOWER EXTREMITY (N/A) as a surgical intervention.  The patient's history has been reviewed, patient examined, no change in status, stable for surgery.  I have reviewed the patient's chart and labs.  Questions were answered to the patient's satisfaction.     Brandon Collins

## 2022-10-12 ENCOUNTER — Telehealth: Payer: Self-pay | Admitting: Vascular Surgery

## 2022-10-12 NOTE — Telephone Encounter (Signed)
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 [name], MD  Procedure Performed: 1.  Ultrasound-guided can lesion left common femoral artery 2.  Aortogram with bilateral lower extremity runoff 3.  Stent of proximal femoropopliteal PTFE graft with 6 x 40 mm Eluvia 4.  Mynx device closure left common femoral artery 5.  Moderate sedation with fentanyl and Versed for 36 minutes  Follow-up with me or PA in 4 to 6 weeks with EXTR arterial duplex and ABIs.  [name]

## 2022-11-02 ENCOUNTER — Other Ambulatory Visit: Payer: Self-pay | Admitting: *Deleted

## 2022-11-02 DIAGNOSIS — I739 Peripheral vascular disease, unspecified: Secondary | ICD-10-CM

## 2022-11-08 ENCOUNTER — Ambulatory Visit: Payer: Medicare HMO | Admitting: Podiatry

## 2022-11-08 VITALS — BP 155/77 | HR 60

## 2022-11-08 DIAGNOSIS — E1151 Type 2 diabetes mellitus with diabetic peripheral angiopathy without gangrene: Secondary | ICD-10-CM

## 2022-11-08 DIAGNOSIS — M79674 Pain in right toe(s): Secondary | ICD-10-CM

## 2022-11-08 DIAGNOSIS — L84 Corns and callosities: Secondary | ICD-10-CM

## 2022-11-08 DIAGNOSIS — M79675 Pain in left toe(s): Secondary | ICD-10-CM | POA: Diagnosis not present

## 2022-11-08 DIAGNOSIS — B351 Tinea unguium: Secondary | ICD-10-CM | POA: Diagnosis not present

## 2022-11-08 NOTE — Progress Notes (Signed)
  Subjective:  Patient ID: Brandon Collins, male    DOB: 1950/11/21,  MRN: [id number]  SOO RALL presents to clinic today for at risk foot care. Pt has h/o NIDDM with PAD and painful, discolored, thick toenails which interfere with daily activities  Chief Complaint  Patient presents with   Diabetes    DFC BS - CHECKS IT ONCE A WEEK [facility] - 09/2022 TOOK BP MEDICINE TODAY   New problem(s): None.   PCP is Slatosky, Marshall Cork., MD.  No Known Allergies  Review of Systems: Negative except as noted in the HPI.  Objective: No changes noted in today's physical examination. Vitals:   11/08/22 1431  BP: (!) 155/77  Pulse: 60    Brandon Collins is a pleasant 72 y.o. male morbidly obese in NAD. AAO x 3. Vascular Examination: CFT <3 seconds b/l LE. Palpable DP pulse(s) b/l LE. Palpable PT pulse(s) b/l LE. Pedal hair absent. No pain with calf compression b/l. Trace edema b/l LE.  Dermatological Examination: No open wounds b/l LE. No interdigital macerations noted b/l LE. Toenails 1-5 bilaterally elongated, discolored, dystrophic, thickened, and crumbly with subungual debris and tenderness to dorsal palpation.   Hyperkeratotic lesion(s) submet head 5 b/l.  No erythema, no edema, no drainage, no fluctuance. Healing abrasion(s) with intact scab noted left great toe. No erythema, no edema, no drainage, no fluctuance.  Neurological Examination: Protective sensation diminished with 10g monofilament b/l. Vibratory sensation intact b/l.  Musculoskeletal Examination: Muscle strength 5/5 to all lower extremity muscle groups bilaterally. No pain, crepitus or joint limitation noted with ROM bilateral LE. Pes planus deformity noted bilateral LE.  Assessment/Plan: 1. Pain due to onychomycosis of toenails of both feet   2. Callus   3. Type II diabetes mellitus with peripheral circulatory disorder (HCC)    -Consent given for treatment as described below: -Examined patient. -Toenails 1-5 bilaterally were  debrided in length and girth with sterile nail nippers and dremel. Pinpoint bleeding of right great toe addressed with Lumicain Hemostatic Solution, cleansed with alcohol. triple antibiotic ointment applied. Patient/careigver instructed to apply triple antibiotic ointment once daily for 3 days. -Callus(es) submet head 5 b/l pared utilizing sterile scalpel blade without complication or incident. Total number debrided =2. -Patient/POA to call should there be question/concern in the interim.   No follow-ups on file.  Marzetta Board, DPM

## 2022-11-11 ENCOUNTER — Encounter: Payer: Self-pay | Admitting: Podiatry

## 2022-11-21 ENCOUNTER — Ambulatory Visit (INDEPENDENT_AMBULATORY_CARE_PROVIDER_SITE_OTHER)
Admission: RE | Admit: 2022-11-21 | Discharge: 2022-11-21 | Disposition: A | Discharge status: Home / Self Care | Payer: Medicare HMO | Source: Ambulatory Visit | Attending: Vascular Surgery | Admitting: Vascular Surgery

## 2022-11-21 ENCOUNTER — Ambulatory Visit (INDEPENDENT_AMBULATORY_CARE_PROVIDER_SITE_OTHER): Payer: Medicare HMO | Admitting: Physician Assistant

## 2022-11-21 ENCOUNTER — Ambulatory Visit (HOSPITAL_COMMUNITY)
Admission: RE | Admit: 2022-11-21 | Discharge: 2022-11-21 | Disposition: A | Discharge status: Home / Self Care | Payer: Medicare HMO | Source: Ambulatory Visit | Attending: Vascular Surgery | Admitting: Vascular Surgery

## 2022-11-21 VITALS — BP 181/83 | HR 57 | Temp 97.9°F | Ht 72.0 in | Wt 278.0 lb

## 2022-11-21 DIAGNOSIS — I739 Peripheral vascular disease, unspecified: Secondary | ICD-10-CM

## 2022-11-21 DIAGNOSIS — M7989 Other specified soft tissue disorders: Secondary | ICD-10-CM | POA: Diagnosis not present

## 2022-11-21 LAB — VAS US ABI WITH/WO TBI: Left ABI: 0.87

## 2022-11-21 NOTE — Progress Notes (Signed)
Office Note     CC:  follow up Requesting Provider:  Enid Skeens., MD  HPI: Brandon Collins is a 72 y.o. (1951/08/13) male who presents for follow up of PAD. He recently underwent Aortogram, Arteriogram of right lower extremity with stenting of the proximal bypass graft by Dr. [name] on 10/08/22. At his recent office visit his duplex showed elevated velocities which were concerning for compromise of his right femoropopliteal vein bypass graft. He additionally had a decrease in his ABI so angiography was recommended.   Today he presents with his wife. He reports no pain in his legs on ambulation or rest. No tissue loss. He continues to have BLE swelling R> L. This is unchanged. He elevates when he can and wears knee high compression stockings intermittently.   The pt is on a statin for cholesterol management.  The pt is on a daily aspirin.   Other AC:  Plavix The pt is on CCB, HCTZ, ACE for hypertension.   The pt is diabetic. Tobacco hx:  former  Past Medical History:  Diagnosis Date   Arthritis    "all over" ([id number])   Complication of anesthesia 07/2018   "out of it for 2 weeks."  "Low Blood Sugar."   High cholesterol    HOH (hard of hearing)    Hypertension    Type II diabetes mellitus ([place])    Ulcer of ankle, right, with unspecified severity [facility])     Past Surgical History:  Procedure Laterality Date   ABDOMINAL AORTOGRAM W/LOWER EXTREMITY N/A 01/22/2019   Procedure: ABDOMINAL AORTOGRAM W/LOWER EXTREMITY;  Surgeon: Waynetta Sandy, MD;  Location: [place] [facility];  Service: Cardiovascular;  Laterality: N/A;  rt leg   ABDOMINAL AORTOGRAM W/LOWER EXTREMITY N/A 10/26/2019   Procedure: ABDOMINAL AORTOGRAM W/LOWER EXTREMITY;  Surgeon: Waynetta Sandy, MD;  Location: [place] [facility];  Service: Cardiovascular;  Laterality: N/A;  Rt lower extermity    ABDOMINAL AORTOGRAM W/LOWER EXTREMITY Bilateral 09/26/2020   Procedure: ABDOMINAL AORTOGRAM W/LOWER EXTREMITY;   Surgeon: Waynetta Sandy, MD;  Location: [place] [facility];  Service: Cardiovascular;  Laterality: Bilateral;   ABDOMINAL AORTOGRAM W/LOWER EXTREMITY N/A 10/08/2022   Procedure: ABDOMINAL AORTOGRAM W/LOWER EXTREMITY;  Surgeon: Waynetta Sandy, MD;  Location: [place] [facility];  Service: Cardiovascular;  Laterality: N/A;   ANTERIOR CERVICAL DECOMP/DISCECTOMY FUSION  08/12/2018   CERVICAL 3-4, CERVICAL 4-5, CERVICAL 5-6, CERVICAL 6-7 WITH INSTRUMENTATION AND ALLOGRAFT   ANTERIOR CERVICAL DECOMPRESSION/DISCECTOMY FUSION 4 LEVELS N/A 08/12/2018   Procedure: ANTERIOR CERVICAL DECOMPRESSION FUSION CERVICAL 3-4, CERVICAL 4-5, CERVICAL 5-6, CERVICAL 6-7 WITH INSTRUMENTATION AND ALLOGRAFT;  Surgeon: Phylliss Bob, MD;  Location: [place];  Service: Orthopedics;  Laterality: N/A;  ANTERIOR CERVICAL DECOMPRESSION FUSION CERVICAL 3-4, CERVICAL 4-5, CERVICAL 5-6, CERVICAL 6-7 WITH INSTRUMENTATION AND ALLOGRAFT   COLONOSCOPY W/ POLYPECTOMY     FEMORAL-POPLITEAL BYPASS GRAFT Right 01/27/2019   Procedure: right FEMORAL ENDARTERECTOMY, RIGHT  FEMORAL- BELOW THE KNEE POPLITEAL ARTERY BYPASS GRAFT using 67m propaten ringed gore graft, attempted right femoral to below knee popliteal artery bypass using right nonreversed great saphenous vein;  Surgeon: CWaynetta Sandy MD;  [place]  Service: Vascular;  Laterality: Right;   [place]  "? side"   KNEE ARTHROSCOPY Right    PERIPHERAL VASCULAR ATHERECTOMY  10/26/2019   Procedure: PERIPHERAL VASCULAR ATHERECTOMY;  Surgeon: CWaynetta Sandy MD;  [place] [facility];  Service: Cardiovascular;;  LASER   PERIPHERAL VASCULAR  BALLOON ANGIOPLASTY Right 09/26/2020   Procedure: PERIPHERAL VASCULAR BALLOON ANGIOPLASTY;  Surgeon: Waynetta Sandy, MD;  Location: [place] [facility];  Service: Cardiovascular;  Laterality: Right;  SFA  FEMPOP BYPASS GRAFT   PERIPHERAL VASCULAR INTERVENTION Right 10/08/2022    Procedure: PERIPHERAL VASCULAR INTERVENTION;  Surgeon: Waynetta Sandy, MD;  Location: [place] [facility];  Service: Cardiovascular;  Laterality: Right;  Fem-pop bypass   PILONIDAL CYST EXCISION      Social History   Socioeconomic History   Marital status: Married    Spouse name: Not on file   Number of children: Not on file   Years of education: Not on file   Highest education level: Not on file  Occupational History   Not on file  Tobacco Use   Smoking status: Former    Packs/day: 1.00    Years: 30.00    Total pack years: 30.00    Types: Cigarettes    Passive exposure: Never   Smokeless tobacco: Never   Tobacco comments:    quit in late 1990's  Vaping Use   Vaping Use: Never used  Substance and Sexual Activity   Alcohol use: Not Currently    Comment: 08/12/2018 "nothing in the 2000s"   Drug use: Never   Sexual activity: Not Currently  Other Topics Concern   Not on file  Social History Narrative   Not on file   Social Determinants of Health   Financial Resource Strain: Not on file  Food Insecurity: Not on file  Transportation Needs: Not on file  Physical Activity: Not on file  Stress: Not on file  Social Connections: Not on file  Intimate Partner Violence: Not on file    Family History  Problem Relation Age of Onset   Cancer Mother    Stroke Father        multiple    Current Outpatient Medications  Medication Sig Dispense Refill   Accu-Chek Softclix Lancets lancets daily.     amLODipine (NORVASC) 10 MG tablet Take 10 mg by mouth every other day.     aspirin EC 81 MG tablet Take 81 mg by mouth daily.     atenolol (TENORMIN) 50 MG tablet Take 50 mg by mouth daily.     atorvastatin (LIPITOR) 20 MG tablet Take 20 mg by mouth at bedtime.     Blood Glucose Monitoring Suppl (ACCU-CHEK GUIDE) w/Device KIT See admin instructions.     cholecalciferol (VITAMIN D) 1000 units tablet Take 1 tablet (1,000 Units total) by mouth daily.     clopidogrel (PLAVIX)  75 MG tablet TAKE 1 TABLET BY MOUTH EVERY DAY 90 tablet 3   glimepiride (AMARYL) 4 MG tablet Take 1 tablet (4 mg total) by mouth every evening. (Patient taking differently: Take 4 mg by mouth daily with breakfast.)     hydrochlorothiazide (HYDRODIURIL) 25 MG tablet Take 25 mg by mouth daily.     lisinopril (ZESTRIL) 40 MG tablet Take 40 mg by mouth daily.     loratadine-pseudoephedrine (CLARITIN-D 24-HOUR) 10-240 MG 24 hr tablet Take 1 tablet by mouth daily.     Naproxen (NAPROSYN PO) Take 220 mg by mouth 2 (two) times daily.     ONETOUCH ULTRA test strip USE TO TEST FASTING SUGAR ONCE DAILY IN THE MORNING     pioglitazone (ACTOS) 15 MG tablet Take 15 mg by mouth daily.     pneumococcal 13-valent conjugate vaccine (PREVNAR 13) SUSP injection Prevnar 13 (PF) 0.5 mL intramuscular syringe  INJECT  O.5MLS AS ONE DOSE     vitamin C (ASCORBIC ACID) 250 MG tablet Take 250 mg by mouth daily.     No current facility-administered medications for this visit.    No Known Allergies   REVIEW OF SYSTEMS:  '[X]'$  denotes positive finding, '[ ]'$  denotes negative finding Cardiac  Comments:  Chest pain or chest pressure:    Shortness of breath upon exertion:    Short of breath when lying flat:    Irregular heart rhythm:        Vascular    Pain in calf, thigh, or hip brought on by ambulation:    Pain in feet at night that wakes you up from your sleep:     Blood clot in your veins:    Leg swelling:         Pulmonary    Oxygen at home:    Productive cough:     Wheezing:         Neurologic    Sudden weakness in arms or legs:     Sudden numbness in arms or legs:     Sudden onset of difficulty speaking or slurred speech:    Temporary loss of vision in one eye:     Problems with dizziness:         Gastrointestinal    Blood in stool:     Vomited blood:         Genitourinary    Burning when urinating:     Blood in urine:        Psychiatric    Major depression:         Hematologic    Bleeding  problems:    Problems with blood clotting too easily:        Skin    Rashes or ulcers:        Constitutional    Fever or chills:      PHYSICAL EXAMINATION:  Vitals:   11/21/22 1433  BP: (!) 181/83  Pulse: (!) 57  Temp: 97.9 F (36.6 C)  TempSrc: Temporal  SpO2: 97%  Weight: 278 lb (126.1 kg)  Height: 6' (1.829 m)    General:  WDWN in NAD; vital signs documented above Gait: ambulates with cane HENT: WNL, normocephalic Pulmonary: normal non-labored breathing , without  wheezing Cardiac: regular HR, without  Murmurs without carotid bruit Abdomen: soft Vascular Exam/Pulses:  Right Left  Radial 2+ (normal) 2+ (normal)  Femoral 2+ (normal) 2+ (normal)  Popliteal Not palpable Not palpable  DP Not palpable 1+ (weak)  PT Not palpable Not palpable   Extremities: without ischemic changes, without Gangrene , without cellulitis; without open wounds; bilateral lower extremities edematous, chronic venous stasis changes to BLE, R>L Musculoskeletal: no muscle wasting or atrophy  Neurologic: A&O X 3;  No focal weakness or paresthesias are detected Psychiatric:  The pt has Normal affect.   Non-Invasive Vascular Imaging:   +-------+-----------+-----------+------------+------------+  ABI/TBIToday's ABIToday's TBIPrevious ABIPrevious TBI  +-------+-----------+-----------+------------+------------+  Right Pink         0.65       [place]          0.61          +-------+-----------+-----------+------------+------------+  Left  0.87       0.44       0.57        0.34          +-------+-----------+-----------+------------+------------+   VAS [place] lower extremity arterial duplex: Summary:  Right: Patent femoropopliteal bypass and stent  of the proximal segment. Velocities in the native inflow, proximal anastomosis and proximal graft suggest 50-70% stenosis; however, disease not appreciated.   ASSESSMENT/PLAN:: 72 y.o. male here for follow up for PAD. He recently underwent  Aortogram, Arteriogram of right lower extremity with stenting of the proximal right femoral popliteal vein bypass graft by Dr. [name] on 10/08/22. Duplex shows patent bypass graft and patent stent. No stenosis appreciable.ABI increased on Left, right remains Non compressible. He is without any claudication, rest pain or tissue loss.  - Continue Aspirin, Statin, Plavix - I will have him return in 3 months with ABI and RLE bypass graft duplex   Karoline Caldwell, [name] [facility] [phone]  Clinic MD:  [place]/ [name]

## 2022-11-23 ENCOUNTER — Other Ambulatory Visit: Payer: Self-pay

## 2022-11-23 DIAGNOSIS — I739 Peripheral vascular disease, unspecified: Secondary | ICD-10-CM

## 2023-01-23 ENCOUNTER — Ambulatory Visit: Payer: Medicare HMO | Admitting: Podiatry

## 2023-01-30 ENCOUNTER — Ambulatory Visit: Payer: Medicare HMO | Admitting: Podiatry

## 2023-01-30 DIAGNOSIS — M79674 Pain in right toe(s): Secondary | ICD-10-CM | POA: Diagnosis not present

## 2023-01-30 DIAGNOSIS — M79675 Pain in left toe(s): Secondary | ICD-10-CM

## 2023-01-30 DIAGNOSIS — B351 Tinea unguium: Secondary | ICD-10-CM

## 2023-01-30 NOTE — Progress Notes (Signed)
       Subjective:  Patient ID: Brandon Collins, male    DOB: 08/19/1951,  MRN: [id number]   Brandon Collins presents to clinic today for:  Chief Complaint  Patient presents with   Nail Problem    Diabetic Foot Care  PCP- Cheri Rous, MD Last Visit- March 2024   . Patient notes nails are thick, discolored, elongated and painful in shoegear when trying to ambulate.  Patient notes that he wears compression socks.  He does have chronic issues with lower leg and foot swelling bilateral  PCP is Slatosky, Excell Seltzer., MD.  No Known Allergies  Review of Systems: Negative except as noted in the HPI.  Objective:  There were no vitals filed for this visit.  Brandon Collins is a pleasant 72 y.o. male in NAD. AAO x 3.  Vascular Examination: Capillary refill time is 3-5 seconds to toes bilateral. Palpable pedal pulses b/l LE. Digital hair present b/l.  +1 pitting edema b/l lower extremity. Skin temperature gradient WNL b/l. No varicosities b/l. No cyanosis or clubbing noted b/l.   Dermatological Examination: Pedal skin with normal turgor, texture and tone b/l. No open wounds. No interdigital macerations b/l. Toenails x10 are 3mm thick, discolored, dystrophic with subungual debris. There is pain with compression of the nail plates.  They are elongated x10  Neurological Examination: Protective sensation intact bilateral LE. Vibratory sensation intact bilateral LE.  Musculoskeletal Examination: Muscle strength 5/5 to all LE muscle groups b/l.   Assessment/Plan: 1. Pain due to onychomycosis of toenails of both feet     The mycotic toenails were sharply debrided x10 with sterile nail nippers and a power debriding burr to decrease bulk/thickness and length.    Continue with compression garments for the lower extremity.  Return in about 3 months (around 05/02/2023) for [facility].   Clerance Lav, DPM, FACFAS [facility]     [address] [place], [place] [address]                Office [phone]  Fax [phone]

## 2023-02-17 ENCOUNTER — Encounter (HOSPITAL_COMMUNITY): Payer: Medicare HMO

## 2023-02-20 ENCOUNTER — Ambulatory Visit: Payer: Medicare HMO | Admitting: Physician Assistant

## 2023-02-20 ENCOUNTER — Ambulatory Visit (INDEPENDENT_AMBULATORY_CARE_PROVIDER_SITE_OTHER)
Admission: RE | Admit: 2023-02-20 | Discharge: 2023-02-20 | Disposition: A | Discharge status: Home / Self Care | Payer: Medicare HMO | Source: Ambulatory Visit | Attending: Vascular Surgery | Admitting: Vascular Surgery

## 2023-02-20 ENCOUNTER — Ambulatory Visit (HOSPITAL_COMMUNITY)
Admission: RE | Admit: 2023-02-20 | Discharge: 2023-02-20 | Disposition: A | Discharge status: Home / Self Care | Payer: Medicare HMO | Source: Ambulatory Visit | Attending: Vascular Surgery | Admitting: Vascular Surgery

## 2023-02-20 VITALS — BP 178/78 | HR 52 | Temp 97.8°F | Wt 271.0 lb

## 2023-02-20 DIAGNOSIS — I739 Peripheral vascular disease, unspecified: Secondary | ICD-10-CM

## 2023-02-20 DIAGNOSIS — M7989 Other specified soft tissue disorders: Secondary | ICD-10-CM | POA: Diagnosis not present

## 2023-02-20 LAB — VAS US ABI WITH/WO TBI: Left ABI: 0.61

## 2023-02-21 ENCOUNTER — Encounter: Payer: Self-pay | Admitting: Physician Assistant

## 2023-02-21 NOTE — Progress Notes (Signed)
Office Note     CC:  follow up Requesting Provider:  Nonnie Done., MD  HPI: Brandon Collins is a 72 y.o. (December 02, 1950) male who presents for surveillance of right lower extremity bypass.  He recently underwent stenting of the proximal bypass graft by Dr. Randie Heinz on 10/08/2022.  He denies any claudication, rest pain, or tissue loss of bilateral lower extremities.  He does have swelling of bilateral lower extremities right more than left however he states this is managed with periodic elevation of the legs.  He does not wear compression socks much anymore.  He is ambulatory with a cane at home.  He denies tobacco use.  He is on aspirin, Plavix, statin daily.  He believes his insulin-dependent diabetes is under control.   Past Medical History:  Diagnosis Date   Arthritis    "all over" (08/12/2018)   Complication of anesthesia 07/2018   "out of it for 2 weeks."  "Low Blood Sugar."   High cholesterol    HOH (hard of hearing)    Hypertension    Type II diabetes mellitus (HCC)    Ulcer of ankle, right, with unspecified severity [facility])     Past Surgical History:  Procedure Laterality Date   ABDOMINAL AORTOGRAM W/LOWER EXTREMITY N/A 01/22/2019   Procedure: ABDOMINAL AORTOGRAM W/LOWER EXTREMITY;  Surgeon: Maeola Harman, MD;  Location: [facility] INVASIVE CV LAB;  Service: Cardiovascular;  Laterality: N/A;  rt leg   ABDOMINAL AORTOGRAM W/LOWER EXTREMITY N/A 10/26/2019   Procedure: ABDOMINAL AORTOGRAM W/LOWER EXTREMITY;  Surgeon: Maeola Harman, MD;  Location: [facility] INVASIVE CV LAB;  Service: Cardiovascular;  Laterality: N/A;  Rt lower extermity    ABDOMINAL AORTOGRAM W/LOWER EXTREMITY Bilateral 09/26/2020   Procedure: ABDOMINAL AORTOGRAM W/LOWER EXTREMITY;  Surgeon: Maeola Harman, MD;  Location: [facility] INVASIVE CV LAB;  Service: Cardiovascular;  Laterality: Bilateral;   ABDOMINAL AORTOGRAM W/LOWER EXTREMITY N/A 10/08/2022   Procedure: ABDOMINAL AORTOGRAM W/LOWER EXTREMITY;  Surgeon:  Maeola Harman, MD;  Location: [facility] INVASIVE CV LAB;  Service: Cardiovascular;  Laterality: N/A;   ANTERIOR CERVICAL DECOMP/DISCECTOMY FUSION  08/12/2018   CERVICAL 3-4, CERVICAL 4-5, CERVICAL 5-6, CERVICAL 6-7 WITH INSTRUMENTATION AND ALLOGRAFT   ANTERIOR CERVICAL DECOMPRESSION/DISCECTOMY FUSION 4 LEVELS N/A 08/12/2018   Procedure: ANTERIOR CERVICAL DECOMPRESSION FUSION CERVICAL 3-4, CERVICAL 4-5, CERVICAL 5-6, CERVICAL 6-7 WITH INSTRUMENTATION AND ALLOGRAFT;  Surgeon: Estill Bamberg, MD;  Location: [facility] OR;  Service: Orthopedics;  Laterality: N/A;  ANTERIOR CERVICAL DECOMPRESSION FUSION CERVICAL 3-4, CERVICAL 4-5, CERVICAL 5-6, CERVICAL 6-7 WITH INSTRUMENTATION AND ALLOGRAFT   COLONOSCOPY W/ POLYPECTOMY     FEMORAL-POPLITEAL BYPASS GRAFT Right 01/27/2019   Procedure: right FEMORAL ENDARTERECTOMY, RIGHT  FEMORAL- BELOW THE KNEE POPLITEAL ARTERY BYPASS GRAFT using 6mm propaten ringed gore graft, attempted right femoral to below knee popliteal artery bypass using right nonreversed great saphenous vein;  Surgeon: Maeola Harman, MD;  Location: [facility] OR;  Service: Vascular;  Laterality: Right;   INGUINAL HERNIA REPAIR  1958   "? side"   KNEE ARTHROSCOPY Right    PERIPHERAL VASCULAR ATHERECTOMY  10/26/2019   Procedure: PERIPHERAL VASCULAR ATHERECTOMY;  Surgeon: Maeola Harman, MD;  Location: [facility] INVASIVE CV LAB;  Service: Cardiovascular;;  LASER   PERIPHERAL VASCULAR BALLOON ANGIOPLASTY Right 09/26/2020   Procedure: PERIPHERAL VASCULAR BALLOON ANGIOPLASTY;  Surgeon: Maeola Harman, MD;  Location: [facility] INVASIVE CV LAB;  Service: Cardiovascular;  Laterality: Right;  SFA  FEMPOP BYPASS GRAFT   PERIPHERAL VASCULAR INTERVENTION Right 10/08/2022   Procedure: PERIPHERAL VASCULAR  INTERVENTION;  Surgeon: Maeola Harman, MD;  Location: [facility] INVASIVE CV LAB;  Service: Cardiovascular;  Laterality: Right;  Fem-pop bypass   PILONIDAL CYST EXCISION      Social History    Socioeconomic History   Marital status: Married    Spouse name: Not on file   Number of children: Not on file   Years of education: Not on file   Highest education level: Not on file  Occupational History   Not on file  Tobacco Use   Smoking status: Former    Packs/day: 1.00    Years: 30.00    Additional pack years: 0.00    Total pack years: 30.00    Types: Cigarettes    Passive exposure: Never   Smokeless tobacco: Never   Tobacco comments:    quit in late 1990's  Vaping Use   Vaping Use: Never used  Substance and Sexual Activity   Alcohol use: Not Currently    Comment: 08/12/2018 "nothing in the 2000s"   Drug use: Never   Sexual activity: Not Currently  Other Topics Concern   Not on file  Social History Narrative   Not on file   Social Determinants of Health   Financial Resource Strain: Not on file  Food Insecurity: Not on file  Transportation Needs: Not on file  Physical Activity: Not on file  Stress: Not on file  Social Connections: Not on file  Intimate Partner Violence: Not on file    Family History  Problem Relation Age of Onset   Cancer Mother    Stroke Father        multiple    Current Outpatient Medications  Medication Sig Dispense Refill   Accu-Chek Softclix Lancets lancets daily.     amLODipine (NORVASC) 10 MG tablet Take 10 mg by mouth every other day.     aspirin EC 81 MG tablet Take 81 mg by mouth daily.     atenolol (TENORMIN) 50 MG tablet Take 50 mg by mouth daily.     atorvastatin (LIPITOR) 20 MG tablet Take 20 mg by mouth at bedtime.     Blood Glucose Monitoring Suppl (ACCU-CHEK GUIDE) w/Device KIT See admin instructions.     cholecalciferol (VITAMIN D) 1000 units tablet Take 1 tablet (1,000 Units total) by mouth daily.     clopidogrel (PLAVIX) 75 MG tablet TAKE 1 TABLET BY MOUTH EVERY DAY 90 tablet 3   glimepiride (AMARYL) 4 MG tablet Take 1 tablet (4 mg total) by mouth every evening. (Patient taking differently: Take 4 mg by mouth  daily with breakfast.)     hydrochlorothiazide (HYDRODIURIL) 25 MG tablet Take 25 mg by mouth daily.     lisinopril (ZESTRIL) 40 MG tablet Take 40 mg by mouth daily.     loratadine-pseudoephedrine (CLARITIN-D 24-HOUR) 10-240 MG 24 hr tablet Take 1 tablet by mouth daily.     Naproxen (NAPROSYN PO) Take 220 mg by mouth 2 (two) times daily.     ONETOUCH ULTRA test strip USE TO TEST FASTING SUGAR ONCE DAILY IN THE MORNING     pioglitazone (ACTOS) 15 MG tablet Take 15 mg by mouth daily.     pneumococcal 13-valent conjugate vaccine (PREVNAR 13) SUSP injection Prevnar 13 (PF) 0.5 mL intramuscular syringe  INJECT O.5MLS AS ONE DOSE     vitamin C (ASCORBIC ACID) 250 MG tablet Take 250 mg by mouth daily.     No current facility-administered medications for this visit.    No Known Allergies  REVIEW OF SYSTEMS:   [X]  denotes positive finding, [ ]  denotes negative finding Cardiac  Comments:  Chest pain or chest pressure:    Shortness of breath upon exertion:    Short of breath when lying flat:    Irregular heart rhythm:        Vascular    Pain in calf, thigh, or hip brought on by ambulation:    Pain in feet at night that wakes you up from your sleep:     Blood clot in your veins:    Leg swelling:         Pulmonary    Oxygen at home:    Productive cough:     Wheezing:         Neurologic    Sudden weakness in arms or legs:     Sudden numbness in arms or legs:     Sudden onset of difficulty speaking or slurred speech:    Temporary loss of vision in one eye:     Problems with dizziness:         Gastrointestinal    Blood in stool:     Vomited blood:         Genitourinary    Burning when urinating:     Blood in urine:        Psychiatric    Major depression:         Hematologic    Bleeding problems:    Problems with blood clotting too easily:        Skin    Rashes or ulcers:        Constitutional    Fever or chills:      PHYSICAL EXAMINATION:  Vitals:   02/20/23 1357   BP: (!) 178/78  Pulse: (!) 52  Temp: 97.8 F (36.6 C)  TempSrc: Temporal  SpO2: 95%  Weight: 271 lb (122.9 kg)    General:  WDWN in NAD; vital signs documented above Gait: Not observed HENT: WNL, normocephalic Pulmonary: normal non-labored breathing , without Rales, rhonchi,  wheezing Cardiac: regular HR Abdomen: soft, NT, no masses Skin: without rashes Vascular Exam/Pulses: DP and PT by doppler bilaterally Extremities: without ischemic changes, without Gangrene , without cellulitis; without open wounds;  Musculoskeletal: no muscle wasting or atrophy  Neurologic: A&O X 3 Psychiatric:  The pt has Normal affect.   Non-Invasive Vascular Imaging:   Patent right leg bypass graft without hemodynamically significant stenosis.  Patent stent in the proximal graft  ABI/TBIToday's ABIToday's TBIPrevious ABIPrevious TBI  +-------+-----------+-----------+------------+------------+  Right [place]         0.58       [place]          0.65          +-------+-----------+-----------+------------+------------+  Left  0.61       0.31       0.87        0.44          +-------+-----------+-----------+------------+--------     ASSESSMENT/PLAN:: 72 y.o. male here for follow up for surveillance of right lower extremity bypass graft  -Right lower extremity well-perfused with brisk Doppler signals on exam.  He has history of right lower extremity bypass with several endovascular interventions most recent being stenting of the proximal graft in January of this year.  This intervention appears to be patent.  The graft is widely patent without any hemodynamically significant stenosis.  He is without claudication, rest pain, and tissue loss.  His bilateral lower extremity edema  is managed by periodic elevation.  Recommended knee-high 15 to 20 mmHg compression should the edema worsen.  Also encouraged increasing ambulation.  Continue aspirin, Plavix, statin daily.  Repeat right lower extremity bypass  duplex and ABI in 6 months per protocol.  He will continue to follow regularly with his PCP for chronic medical conditions including insulin-dependent diabetes mellitus.   Emilie Rutter, [name] [facility] [phone]  Clinic MD:   Randie Heinz

## 2023-04-10 LAB — HM AWV

## 2023-04-18 ENCOUNTER — Ambulatory Visit: Payer: Medicare HMO | Admitting: Podiatry

## 2023-04-18 DIAGNOSIS — R601 Generalized edema: Secondary | ICD-10-CM

## 2023-04-18 DIAGNOSIS — M79675 Pain in left toe(s): Secondary | ICD-10-CM

## 2023-04-18 DIAGNOSIS — M79674 Pain in right toe(s): Secondary | ICD-10-CM | POA: Diagnosis not present

## 2023-04-18 DIAGNOSIS — B351 Tinea unguium: Secondary | ICD-10-CM | POA: Diagnosis not present

## 2023-04-18 NOTE — Progress Notes (Signed)
       Subjective:  Patient ID: Brandon Collins, male    DOB: [birth year],  MRN: [id number]  Patient notes nails are thick, discolored, elongated and painful in shoegear when trying to ambulate.  Patient notes that he wears compression socks.    PCP is Slatosky, Excell Seltzer., MD.  No Known Allergies  Review of Systems: Negative except as noted in the HPI.  Objective:  There were no vitals filed for this visit.  Brandon Collins is a pleasant 72 y.o. male in NAD. AAO x 3.  Vascular Examination: Capillary refill time is 3-5 seconds to toes bilateral. Palpable pedal pulses b/l LE. Digital hair present b/l.  +1 pitting edema b/l lower extremity. Skin temperature gradient WNL b/l. No varicosities b/l. No cyanosis or clubbing noted b/l.   Dermatological Examination: Pedal skin with normal turgor, texture and tone b/l. No open wounds. No interdigital macerations b/l. Toenails x10 are 3mm thick, discolored, dystrophic with subungual debris. There is pain with compression of the nail plates.  They are elongated x10.  Skin is dry bilateral.  Assessment/Plan: 1. Pain due to onychomycosis of toenails of both feet   2. Generalized edema    The mycotic toenails were sharply debrided x10 with sterile nail nippers and a power debriding burr to decrease bulk/thickness and length.    Continue with compression garments for the lower extremity.  Return in about 3 months (around 07/19/2023) for [facility].   Clerance Lav, DPM, FACFAS [facility]     [address] [place], [place] [address]                Office [phone]  Fax [phone]

## 2023-07-18 ENCOUNTER — Ambulatory Visit: Payer: Medicare HMO | Admitting: Podiatry

## 2023-08-01 ENCOUNTER — Ambulatory Visit: Payer: Medicare HMO | Admitting: Podiatry

## 2023-08-01 DIAGNOSIS — M79675 Pain in left toe(s): Secondary | ICD-10-CM | POA: Diagnosis not present

## 2023-08-01 DIAGNOSIS — B351 Tinea unguium: Secondary | ICD-10-CM

## 2023-08-01 DIAGNOSIS — M79674 Pain in right toe(s): Secondary | ICD-10-CM | POA: Diagnosis not present

## 2023-08-01 NOTE — Progress Notes (Signed)
Subjective:  Patient ID: Brandon Collins, male    DOB: 1951-02-07,  MRN: [id number]   Brandon Collins presents to clinic today for:  Chief Complaint  Patient presents with   Foot Care    Last A1c: 6.2. takes clopidogrel. LT 1st is thickened.   . Patient notes nails are thick, discolored, elongated and painful in shoegear when trying to ambulate.    PCP is Slatosky, Excell Seltzer., MD.  Past Medical History:  Diagnosis Date   Arthritis    "all over" (08/12/2018)   Complication of anesthesia 07/2018   "out of it for 2 weeks."  "Low Blood Sugar."   High cholesterol    HOH (hard of hearing)    Hypertension    Type II diabetes mellitus (HCC)    Ulcer of ankle, right, with unspecified severity [facility])     Past Surgical History:  Procedure Laterality Date   ABDOMINAL AORTOGRAM W/LOWER EXTREMITY N/A 01/22/2019   Procedure: ABDOMINAL AORTOGRAM W/LOWER EXTREMITY;  Surgeon: Maeola Harman, MD;  Location: [facility] INVASIVE CV LAB;  Service: Cardiovascular;  Laterality: N/A;  rt leg   ABDOMINAL AORTOGRAM W/LOWER EXTREMITY N/A 10/26/2019   Procedure: ABDOMINAL AORTOGRAM W/LOWER EXTREMITY;  Surgeon: Maeola Harman, MD;  Location: [facility] INVASIVE CV LAB;  Service: Cardiovascular;  Laterality: N/A;  Rt lower extermity    ABDOMINAL AORTOGRAM W/LOWER EXTREMITY Bilateral 09/26/2020   Procedure: ABDOMINAL AORTOGRAM W/LOWER EXTREMITY;  Surgeon: Maeola Harman, MD;  Location: [facility] INVASIVE CV LAB;  Service: Cardiovascular;  Laterality: Bilateral;   ABDOMINAL AORTOGRAM W/LOWER EXTREMITY N/A 10/08/2022   Procedure: ABDOMINAL AORTOGRAM W/LOWER EXTREMITY;  Surgeon: Maeola Harman, MD;  Location: [facility] INVASIVE CV LAB;  Service: Cardiovascular;  Laterality: N/A;   ANTERIOR CERVICAL DECOMP/DISCECTOMY FUSION  08/12/2018   CERVICAL 3-4, CERVICAL 4-5, CERVICAL 5-6, CERVICAL 6-7 WITH INSTRUMENTATION AND ALLOGRAFT   ANTERIOR CERVICAL DECOMPRESSION/DISCECTOMY FUSION 4 LEVELS N/A 08/12/2018    Procedure: ANTERIOR CERVICAL DECOMPRESSION FUSION CERVICAL 3-4, CERVICAL 4-5, CERVICAL 5-6, CERVICAL 6-7 WITH INSTRUMENTATION AND ALLOGRAFT;  Surgeon: Estill Bamberg, MD;  Location: [facility] OR;  Service: Orthopedics;  Laterality: N/A;  ANTERIOR CERVICAL DECOMPRESSION FUSION CERVICAL 3-4, CERVICAL 4-5, CERVICAL 5-6, CERVICAL 6-7 WITH INSTRUMENTATION AND ALLOGRAFT   COLONOSCOPY W/ POLYPECTOMY     FEMORAL-POPLITEAL BYPASS GRAFT Right 01/27/2019   Procedure: right FEMORAL ENDARTERECTOMY, RIGHT  FEMORAL- BELOW THE KNEE POPLITEAL ARTERY BYPASS GRAFT using 6mm propaten ringed gore graft, attempted right femoral to below knee popliteal artery bypass using right nonreversed great saphenous vein;  Surgeon: Maeola Harman, MD;  Location: [facility] OR;  Service: Vascular;  Laterality: Right;   INGUINAL HERNIA REPAIR  1958   "? side"   KNEE ARTHROSCOPY Right    PERIPHERAL VASCULAR ATHERECTOMY  10/26/2019   Procedure: PERIPHERAL VASCULAR ATHERECTOMY;  Surgeon: Maeola Harman, MD;  Location: [facility] INVASIVE CV LAB;  Service: Cardiovascular;;  LASER   PERIPHERAL VASCULAR BALLOON ANGIOPLASTY Right 09/26/2020   Procedure: PERIPHERAL VASCULAR BALLOON ANGIOPLASTY;  Surgeon: Maeola Harman, MD;  Location: [facility] INVASIVE CV LAB;  Service: Cardiovascular;  Laterality: Right;  SFA  FEMPOP BYPASS GRAFT   PERIPHERAL VASCULAR INTERVENTION Right 10/08/2022   Procedure: PERIPHERAL VASCULAR INTERVENTION;  Surgeon: Maeola Harman, MD;  Location: [facility] INVASIVE CV LAB;  Service: Cardiovascular;  Laterality: Right;  Fem-pop bypass   PILONIDAL CYST EXCISION      No Known Allergies  Review of Systems: Negative except as noted in the HPI.  Objective:  Brandon Cowboy  Collins is a pleasant 72 y.o. male in NAD. AAO x 3.  Vascular Examination: Capillary refill time is 3-5 seconds to toes bilateral. Trace palpable pedal pulses b/l LE. Digital hair sparse b/l.  Skin temperature gradient WNL b/l.  No cyanosis noted b/l. +1  pitting edema bilateral.    Dermatological Examination: Pedal skin with normal turgor, texture and tone b/l. No open wounds. No interdigital macerations b/l. Toenails x10 are 3mm thick, discolored, dystrophic with subungual debris. There is pain with compression of the nail plates.  They are elongated x10.  Dry blood present near hyponychium of left hallux nail.  Stable, no ulceration.  Assessment/Plan: 1. Pain due to onychomycosis of toenails of both feet    The mycotic toenails were sharply debrided x10 with sterile nail nippers and a power debriding burr to decrease bulk/thickness and length.    Return in about 3 months (around 11/01/2023) for [facility].   Clerance Lav, DPM, FACFAS [facility]     [address] [place], [place] [address]                Office [phone]  Fax [phone]

## 2023-08-05 ENCOUNTER — Other Ambulatory Visit: Payer: Self-pay

## 2023-08-05 DIAGNOSIS — I739 Peripheral vascular disease, unspecified: Secondary | ICD-10-CM

## 2023-08-21 ENCOUNTER — Ambulatory Visit (HOSPITAL_COMMUNITY)
Admission: RE | Admit: 2023-08-21 | Discharge: 2023-08-21 | Disposition: A | Discharge status: Home / Self Care | Payer: Medicare HMO | Source: Ambulatory Visit | Attending: Vascular Surgery | Admitting: Vascular Surgery

## 2023-08-21 ENCOUNTER — Ambulatory Visit: Payer: Medicare HMO | Admitting: Physician Assistant

## 2023-08-21 ENCOUNTER — Ambulatory Visit (INDEPENDENT_AMBULATORY_CARE_PROVIDER_SITE_OTHER)
Admission: RE | Admit: 2023-08-21 | Discharge: 2023-08-21 | Disposition: A | Discharge status: Home / Self Care | Payer: Medicare HMO | Source: Ambulatory Visit | Attending: Vascular Surgery | Admitting: Vascular Surgery

## 2023-08-21 VITALS — BP 155/70 | HR 70 | Temp 97.9°F | Ht 72.0 in | Wt 267.1 lb

## 2023-08-21 DIAGNOSIS — I739 Peripheral vascular disease, unspecified: Secondary | ICD-10-CM | POA: Diagnosis not present

## 2023-08-21 LAB — VAS US ABI WITH/WO TBI: Left ABI: 0.82

## 2023-08-21 NOTE — Progress Notes (Signed)
Office Note     CC:  follow up Requesting Provider:  Nonnie Done., MD  HPI: CLELL SCHWADERER is a 72 y.o. (1951-06-22) male who presents for surveillance of PAD.  Surgical history is significant for right common femoral artery endarterectomy and femoral to below the knee popliteal bypass with PTFE by Dr. Randie Heinz on 01/27/2019.  In February 2021 he required laser atherectomy of the right common femoral artery and balloon angioplasty of the proximal graft to treat proximal anastomosis stenosis.  In February 2022 he required repeat balloon angioplasty of the proximal graft for restenosis.  He then underwent stenting of the proximal bypass graft on 10/08/2022 due to recurrent stenosis.  He denies any claudication, rest pain, or tissue loss.  Admittedly his mobility is limited however he does walk with a cane.  He is taking his aspirin, Plavix, statin daily.  He is a former smoker.   Past Medical History:  Diagnosis Date   Arthritis    "all over" (08/12/2018)   Complication of anesthesia 07/2018   "out of it for 2 weeks."  "Low Blood Sugar."   High cholesterol    HOH (hard of hearing)    Hypertension    Peripheral arterial disease (HCC)    Type II diabetes mellitus (HCC)    Ulcer of ankle, right, with unspecified severity [facility])     Past Surgical History:  Procedure Laterality Date   ABDOMINAL AORTOGRAM W/LOWER EXTREMITY N/A 01/22/2019   Procedure: ABDOMINAL AORTOGRAM W/LOWER EXTREMITY;  Surgeon: Maeola Harman, MD;  Location: [facility] INVASIVE CV LAB;  Service: Cardiovascular;  Laterality: N/A;  rt leg   ABDOMINAL AORTOGRAM W/LOWER EXTREMITY N/A 10/26/2019   Procedure: ABDOMINAL AORTOGRAM W/LOWER EXTREMITY;  Surgeon: Maeola Harman, MD;  Location: [facility] INVASIVE CV LAB;  Service: Cardiovascular;  Laterality: N/A;  Rt lower extermity    ABDOMINAL AORTOGRAM W/LOWER EXTREMITY Bilateral 09/26/2020   Procedure: ABDOMINAL AORTOGRAM W/LOWER EXTREMITY;  Surgeon: Maeola Harman, MD;   Location: [facility] INVASIVE CV LAB;  Service: Cardiovascular;  Laterality: Bilateral;   ABDOMINAL AORTOGRAM W/LOWER EXTREMITY N/A 10/08/2022   Procedure: ABDOMINAL AORTOGRAM W/LOWER EXTREMITY;  Surgeon: Maeola Harman, MD;  Location: [facility] INVASIVE CV LAB;  Service: Cardiovascular;  Laterality: N/A;   ANTERIOR CERVICAL DECOMP/DISCECTOMY FUSION  08/12/2018   CERVICAL 3-4, CERVICAL 4-5, CERVICAL 5-6, CERVICAL 6-7 WITH INSTRUMENTATION AND ALLOGRAFT   ANTERIOR CERVICAL DECOMPRESSION/DISCECTOMY FUSION 4 LEVELS N/A 08/12/2018   Procedure: ANTERIOR CERVICAL DECOMPRESSION FUSION CERVICAL 3-4, CERVICAL 4-5, CERVICAL 5-6, CERVICAL 6-7 WITH INSTRUMENTATION AND ALLOGRAFT;  Surgeon: Estill Bamberg, MD;  Location: [facility] OR;  Service: Orthopedics;  Laterality: N/A;  ANTERIOR CERVICAL DECOMPRESSION FUSION CERVICAL 3-4, CERVICAL 4-5, CERVICAL 5-6, CERVICAL 6-7 WITH INSTRUMENTATION AND ALLOGRAFT   COLONOSCOPY W/ POLYPECTOMY     FEMORAL-POPLITEAL BYPASS GRAFT Right 01/27/2019   Procedure: right FEMORAL ENDARTERECTOMY, RIGHT  FEMORAL- BELOW THE KNEE POPLITEAL ARTERY BYPASS GRAFT using 6mm propaten ringed gore graft, attempted right femoral to below knee popliteal artery bypass using right nonreversed great saphenous vein;  Surgeon: Maeola Harman, MD;  Location: [facility] OR;  Service: Vascular;  Laterality: Right;   INGUINAL HERNIA REPAIR  1958   "? side"   KNEE ARTHROSCOPY Right    PERIPHERAL VASCULAR ATHERECTOMY  10/26/2019   Procedure: PERIPHERAL VASCULAR ATHERECTOMY;  Surgeon: Maeola Harman, MD;  Location: [facility] INVASIVE CV LAB;  Service: Cardiovascular;;  LASER   PERIPHERAL VASCULAR BALLOON ANGIOPLASTY Right 09/26/2020   Procedure: PERIPHERAL VASCULAR BALLOON ANGIOPLASTY;  Surgeon: Maeola Harman,  MD;  Location: [facility] INVASIVE CV LAB;  Service: Cardiovascular;  Laterality: Right;  SFA  FEMPOP BYPASS GRAFT   PERIPHERAL VASCULAR INTERVENTION Right 10/08/2022   Procedure: PERIPHERAL VASCULAR  INTERVENTION;  Surgeon: Maeola Harman, MD;  Location: [facility] INVASIVE CV LAB;  Service: Cardiovascular;  Laterality: Right;  Fem-pop bypass   PILONIDAL CYST EXCISION      Social History   Socioeconomic History   Marital status: Married    Spouse name: Not on file   Number of children: Not on file   Years of education: Not on file   Highest education level: Not on file  Occupational History   Not on file  Tobacco Use   Smoking status: Former    Current packs/day: 1.00    Average packs/day: 1 pack/day for 30.0 years (30.0 ttl pk-yrs)    Types: Cigarettes    Passive exposure: Never   Smokeless tobacco: Never   Tobacco comments:    quit in late 1990's  Vaping Use   Vaping status: Never Used  Substance and Sexual Activity   Alcohol use: Not Currently    Comment: 08/12/2018 "nothing in the 2000s"   Drug use: Never   Sexual activity: Not Currently  Other Topics Concern   Not on file  Social History Narrative   Not on file   Social Determinants of Health   Financial Resource Strain: Not on file  Food Insecurity: Not on file  Transportation Needs: Not on file  Physical Activity: Not on file  Stress: Not on file  Social Connections: Not on file  Intimate Partner Violence: Not on file    Family History  Problem Relation Age of Onset   Cancer Mother    Stroke Father        multiple    Current Outpatient Medications  Medication Sig Dispense Refill   Accu-Chek Softclix Lancets lancets daily.     amLODipine (NORVASC) 10 MG tablet Take 10 mg by mouth every other day.     aspirin EC 81 MG tablet Take 81 mg by mouth daily.     atenolol (TENORMIN) 50 MG tablet Take 50 mg by mouth daily.     atorvastatin (LIPITOR) 20 MG tablet Take 20 mg by mouth at bedtime.     Blood Glucose Monitoring Suppl (ACCU-CHEK GUIDE) w/Device KIT See admin instructions.     cholecalciferol (VITAMIN D) 1000 units tablet Take 1 tablet (1,000 Units total) by mouth daily.     clopidogrel  (PLAVIX) 75 MG tablet TAKE 1 TABLET BY MOUTH EVERY DAY 90 tablet 3   glimepiride (AMARYL) 4 MG tablet Take 1 tablet (4 mg total) by mouth every evening. (Patient taking differently: Take 4 mg by mouth daily with breakfast.)     hydrochlorothiazide (HYDRODIURIL) 25 MG tablet Take 25 mg by mouth daily.     lisinopril (ZESTRIL) 40 MG tablet Take 40 mg by mouth daily.     loratadine-pseudoephedrine (CLARITIN-D 24-HOUR) 10-240 MG 24 hr tablet Take 1 tablet by mouth daily.     Naproxen (NAPROSYN PO) Take 220 mg by mouth 2 (two) times daily.     ONETOUCH ULTRA test strip USE TO TEST FASTING SUGAR ONCE DAILY IN THE MORNING     pioglitazone (ACTOS) 15 MG tablet Take 15 mg by mouth daily.     vitamin C (ASCORBIC ACID) 250 MG tablet Take 250 mg by mouth daily.     No current facility-administered medications for this visit.    No Known Allergies  REVIEW OF SYSTEMS:   [X]  denotes positive finding, [ ]  denotes negative finding Cardiac  Comments:  Chest pain or chest pressure:    Shortness of breath upon exertion:    Short of breath when lying flat:    Irregular heart rhythm:        Vascular    Pain in calf, thigh, or hip brought on by ambulation:    Pain in feet at night that wakes you up from your sleep:     Blood clot in your veins:    Leg swelling:         Pulmonary    Oxygen at home:    Productive cough:     Wheezing:         Neurologic    Sudden weakness in arms or legs:     Sudden numbness in arms or legs:     Sudden onset of difficulty speaking or slurred speech:    Temporary loss of vision in one eye:     Problems with dizziness:         Gastrointestinal    Blood in stool:     Vomited blood:         Genitourinary    Burning when urinating:     Blood in urine:        Psychiatric    Major depression:         Hematologic    Bleeding problems:    Problems with blood clotting too easily:        Skin    Rashes or ulcers:        Constitutional    Fever or chills:       PHYSICAL EXAMINATION:  Vitals:   08/21/23 1327  BP: (!) 155/70  Pulse: 70  Temp: 97.9 F (36.6 C)  TempSrc: Temporal  SpO2: 94%  Weight: 267 lb 1.6 oz (121.2 kg)  Height: 6' (1.829 m)    General:  WDWN in NAD; vital signs documented above Gait: Not observed HENT: WNL, normocephalic Pulmonary: normal non-labored breathing , without Rales, rhonchi,  wheezing Cardiac: regular HR Abdomen: soft, NT, no masses Skin: without rashes Vascular Exam/Pulses: absent pedal pulses Extremities: without ischemic changes, without Gangrene , without cellulitis; without open wounds;  Musculoskeletal: no muscle wasting or atrophy  Neurologic: A&O X 3 Psychiatric:  The pt has Normal affect.   Non-Invasive Vascular Imaging:   Widely patent right leg bypass with a patent stent in the proximal portion  ABI/TBIToday's ABIToday's TBIPrevious ABIPrevious TBI  +-------+-----------+-----------+------------+------------+  Right [place]         0.52       [place]          0.58          +-------+-----------+-----------+------------+------------+  Left  0.82       0.38       0.61        0.31             ASSESSMENT/PLAN:: 72 y.o. male here for follow up for surveillance of right lower extremity bypass graft  Mr. Wion is a 72 year old male with history of right leg bypass which has required numerous endovascular interventions.  Most recent intervention was stent placement in the proximal portion of the bypass due to recurrent stenosis.  Duplex today demonstrates a widely patent bypass graft as well as a patent stent in the proximal portion.  He will continue his aspirin, Plavix, statin daily.  I encouraged him to try to increase  his mobility.  We will repeat right leg bypass duplex and ABI in 6 months.  If at that time we do not see any recurrent stenosis we can follow him on an annual basis.  He knows to call/return office sooner with any questions or concerns.   Emilie Rutter,  [name] [facility] [phone]  Clinic MD:   Randie Heinz

## 2023-08-23 ENCOUNTER — Other Ambulatory Visit: Payer: Self-pay

## 2023-08-23 DIAGNOSIS — I739 Peripheral vascular disease, unspecified: Secondary | ICD-10-CM

## 2023-10-16 ENCOUNTER — Ambulatory Visit (INDEPENDENT_AMBULATORY_CARE_PROVIDER_SITE_OTHER): Payer: Medicare HMO | Admitting: Student

## 2023-10-16 ENCOUNTER — Encounter (HOSPITAL_BASED_OUTPATIENT_CLINIC_OR_DEPARTMENT_OTHER): Payer: Self-pay | Admitting: Student

## 2023-10-16 VITALS — BP 167/77 | HR 51 | Temp 97.8°F | Ht 72.0 in | Wt 271.4 lb

## 2023-10-16 DIAGNOSIS — E66812 Obesity, class 2: Secondary | ICD-10-CM

## 2023-10-16 DIAGNOSIS — Z7689 Persons encountering health services in other specified circumstances: Secondary | ICD-10-CM

## 2023-10-16 DIAGNOSIS — I739 Peripheral vascular disease, unspecified: Secondary | ICD-10-CM

## 2023-10-16 DIAGNOSIS — Z6836 Body mass index (BMI) 36.0-36.9, adult: Secondary | ICD-10-CM

## 2023-10-16 DIAGNOSIS — I10 Essential (primary) hypertension: Secondary | ICD-10-CM

## 2023-10-16 DIAGNOSIS — E1151 Type 2 diabetes mellitus with diabetic peripheral angiopathy without gangrene: Secondary | ICD-10-CM

## 2023-10-16 NOTE — Assessment & Plan Note (Signed)
Continuing to have some stasis of blood.  No signs of clotting. Continue to follow with vascular. Encouraged patient to exercise. Stable-continue all medications. For redness on the leg, discussed with patient to make sure it is not growing, did not sharpie line for demarcation.  Patient is unconcerned at this time.

## 2023-10-16 NOTE — Progress Notes (Signed)
New Patient Office Visit  Subjective    Patient ID: Brandon Collins, male    DOB: 1950/11/07  Age: 73 y.o. MRN: [id number]  CC:  Chief Complaint  Patient presents with   Establish Care    Here to establish care.     HPI Brandon Collins presents to establish care. Prior PCP was Dr. Egbert Collins at [facility]. Last physical was unknown.  Hypertension- Denies dizziness, chest pain, or shortness of breath. Notes that his bp spikes with movement because he doesn't get much exercise. BP in clinic is 167/77. Last saw Dr. Egbert Collins about 6 months ago. Dx around age 46. He is prescribed Lisinopril 40mg , amlodipine 10mg  (only takes about 5mg ), hydrochlorothiazide 25mg . Patient also takes atenolol 50mg .   Type 2 diabetes- patient is currently taking glimepiride and pioglitazone. Unable to find last A1c, will update today alongside other baseline labs. Does not check BG at home often. Has not had any episodes of hypoglycemia in the last 4 years. Last several times A1c was checked, patient notes that his A1c was under 7. Records request.  PAD-continues to follow with vascular for surveillance of PAD.  Last seen 08/21/2023-Per note, duplex done that day demonstrated patent bypass graft and patent stent in the proximal portion of right leg.  Planning to repeat right leg bypass duplex and ABI in 6 months. On aspirin, Plavix, and Lipitor.  Per vascular surgery note: 01/27/2019- Right common femoral artery endarterectomy and femoral to below the knee popliteal bypass with PTFE by Dr. Randie Collins.  10/2019- Required laser atherectomy of the right common femoral artery and balloon angioplasty of the proximal graft to treat proximal anastomosis stenosis. 10/2020- Required repeat balloon angioplasty of the proximal graft for restenosis.  10/08/2022- Underwent stenting of the proximal bypass graft due to recurrent stenosis.   Notes that he has had issues with his leg before. Told patient to let me know if redness progresses.  Patient denied marker line on leg to keep track of redness. Noted that he would raise his feet at home. Stated that this generally got rid of it- venous stasis in addition to PAD?Marland Kitchen No noted pain to palpation. No shortness of breath. No bulging vessels.    Screenings:  Colon Cancer: Colonoscopy- clean. Lung Cancer: Former smoker-quit greater than 20 years ago. Diabetes: Indicated Ophthalmology- yes. Went last month- [facility].    Foot-follows with podiatry, last visit was 08/01/2023.  Next visit around 11/01/2023.  Follows due to onychomycosis bilaterally. HLD: Indicated    Outpatient Encounter Medications as of 10/16/2023  Medication Sig   Accu-Chek Softclix Lancets lancets daily.   amLODipine (NORVASC) 10 MG tablet Take 10 mg by mouth every other day.   aspirin EC 81 MG tablet Take 81 mg by mouth daily.   atenolol (TENORMIN) 50 MG tablet Take 50 mg by mouth daily.   atorvastatin (LIPITOR) 20 MG tablet Take 20 mg by mouth at bedtime.   Blood Glucose Monitoring Suppl (ACCU-CHEK GUIDE) w/Device KIT See admin instructions.   cholecalciferol (VITAMIN D) 1000 units tablet Take 1 tablet (1,000 Units total) by mouth daily.   clopidogrel (PLAVIX) 75 MG tablet TAKE 1 TABLET BY MOUTH EVERY DAY   glimepiride (AMARYL) 4 MG tablet Take 1 tablet (4 mg total) by mouth every evening. (Patient taking differently: Take 4 mg by mouth daily with breakfast.)   hydrochlorothiazide (HYDRODIURIL) 25 MG tablet Take 25 mg by mouth daily.   lisinopril (ZESTRIL) 40 MG tablet Take 40 mg by mouth  daily.   loratadine-pseudoephedrine (CLARITIN-D 24-HOUR) 10-240 MG 24 hr tablet Take 1 tablet by mouth daily.   ONETOUCH ULTRA test strip USE TO TEST FASTING SUGAR ONCE DAILY IN THE MORNING   pioglitazone (ACTOS) 15 MG tablet Take 15 mg by mouth daily.   vitamin C (ASCORBIC ACID) 250 MG tablet Take 250 mg by mouth daily.   [DISCONTINUED] Naproxen (NAPROSYN PO) Take 220 mg by mouth 2 (two) times daily. (Patient not taking:  Reported on 10/16/2023)   No facility-administered encounter medications on file as of 10/16/2023.    Past Medical History:  Diagnosis Date   Arthritis    "all over" (08/12/2018)   Complication of anesthesia 07/2018   "out of it for 2 weeks."  "Low Blood Sugar."   High cholesterol    HOH (hard of hearing)    Hypertension    Peripheral arterial disease (HCC)    Type II diabetes mellitus (HCC)    Ulcer of ankle, right, with unspecified severity [facility])     Past Surgical History:  Procedure Laterality Date   ABDOMINAL AORTOGRAM W/LOWER EXTREMITY N/A 01/22/2019   Procedure: ABDOMINAL AORTOGRAM W/LOWER EXTREMITY;  Surgeon: Brandon Harman, MD;  Location: [facility] INVASIVE CV LAB;  Service: Cardiovascular;  Laterality: N/A;  rt leg   ABDOMINAL AORTOGRAM W/LOWER EXTREMITY N/A 10/26/2019   Procedure: ABDOMINAL AORTOGRAM W/LOWER EXTREMITY;  Surgeon: Brandon Harman, MD;  Location: [facility] INVASIVE CV LAB;  Service: Cardiovascular;  Laterality: N/A;  Rt lower extermity    ABDOMINAL AORTOGRAM W/LOWER EXTREMITY Bilateral 09/26/2020   Procedure: ABDOMINAL AORTOGRAM W/LOWER EXTREMITY;  Surgeon: Brandon Harman, MD;  Location: [facility] INVASIVE CV LAB;  Service: Cardiovascular;  Laterality: Bilateral;   ABDOMINAL AORTOGRAM W/LOWER EXTREMITY N/A 10/08/2022   Procedure: ABDOMINAL AORTOGRAM W/LOWER EXTREMITY;  Surgeon: Brandon Harman, MD;  Location: [facility] INVASIVE CV LAB;  Service: Cardiovascular;  Laterality: N/A;   ANTERIOR CERVICAL DECOMP/DISCECTOMY FUSION  08/12/2018   CERVICAL 3-4, CERVICAL 4-5, CERVICAL 5-6, CERVICAL 6-7 WITH INSTRUMENTATION AND ALLOGRAFT   ANTERIOR CERVICAL DECOMPRESSION/DISCECTOMY FUSION 4 LEVELS N/A 08/12/2018   Procedure: ANTERIOR CERVICAL DECOMPRESSION FUSION CERVICAL 3-4, CERVICAL 4-5, CERVICAL 5-6, CERVICAL 6-7 WITH INSTRUMENTATION AND ALLOGRAFT;  Surgeon: Brandon Bamberg, MD;  Location: [facility] OR;  Service: Orthopedics;  Laterality: N/A;  ANTERIOR CERVICAL  DECOMPRESSION FUSION CERVICAL 3-4, CERVICAL 4-5, CERVICAL 5-6, CERVICAL 6-7 WITH INSTRUMENTATION AND ALLOGRAFT   COLONOSCOPY W/ POLYPECTOMY     FEMORAL-POPLITEAL BYPASS GRAFT Right 01/27/2019   Procedure: right FEMORAL ENDARTERECTOMY, RIGHT  FEMORAL- BELOW THE KNEE POPLITEAL ARTERY BYPASS GRAFT using 6mm propaten ringed gore graft, attempted right femoral to below knee popliteal artery bypass using right nonreversed great saphenous vein;  Surgeon: Brandon Harman, MD;  Location: [facility] OR;  Service: Vascular;  Laterality: Right;   INGUINAL HERNIA REPAIR  1958   "? side"   KNEE ARTHROSCOPY Right    PERIPHERAL VASCULAR ATHERECTOMY  10/26/2019   Procedure: PERIPHERAL VASCULAR ATHERECTOMY;  Surgeon: Brandon Harman, MD;  Location: [facility] INVASIVE CV LAB;  Service: Cardiovascular;;  LASER   PERIPHERAL VASCULAR BALLOON ANGIOPLASTY Right 09/26/2020   Procedure: PERIPHERAL VASCULAR BALLOON ANGIOPLASTY;  Surgeon: Brandon Harman, MD;  Location: [facility] INVASIVE CV LAB;  Service: Cardiovascular;  Laterality: Right;  SFA  FEMPOP BYPASS GRAFT   PERIPHERAL VASCULAR INTERVENTION Right 10/08/2022   Procedure: PERIPHERAL VASCULAR INTERVENTION;  Surgeon: Brandon Harman, MD;  Location: [facility] INVASIVE CV LAB;  Service: Cardiovascular;  Laterality: Right;  Fem-pop bypass   PILONIDAL CYST EXCISION  Family History  Problem Relation Age of Onset   Cancer Mother        multiple types   Stroke Father        multiple    Social History   Socioeconomic History   Marital status: Married    Spouse name: Not on file   Number of children: 1   Years of education: Not on file   Highest education level: Not on file  Occupational History   Not on file  Tobacco Use   Smoking status: Former    Current packs/day: 1.00    Average packs/day: 1 pack/day for 30.0 years (30.0 ttl pk-yrs)    Types: Cigarettes    Passive exposure: Never   Smokeless tobacco: Never   Tobacco comments:    quit in  late 1990's  Vaping Use   Vaping status: Never Used  Substance and Sexual Activity   Alcohol use: Not Currently    Comment: 08/12/2018 "nothing in the 2000s"   Drug use: Never   Sexual activity: Not Currently  Other Topics Concern   Not on file  Social History Narrative   1 daughter & 2 step children    Social Drivers of Corporate investment banker Strain: Not on file  Food Insecurity: No Food Insecurity (10/16/2023)   Hunger Vital Sign    Worried About Running Out of Food in the Last Year: Never true    Ran Out of Food in the Last Year: Never true  Transportation Needs: No Transportation Needs (10/16/2023)   PRAPARE - Administrator, Civil Service (Medical): No    Lack of Transportation (Non-Medical): No  Physical Activity: Not on file  Stress: Not on file  Social Connections: Not on file  Intimate Partner Violence: Not At Risk (10/16/2023)   Humiliation, Afraid, Rape, and Kick questionnaire    Fear of Current or Ex-Partner: No    Emotionally Abused: No    Physically Abused: No    Sexually Abused: No    ROS  Per HPI      Objective    BP (!) 167/77 (BP Location: Right Arm, Patient Position: Sitting, Cuff Size: Normal)   Pulse (!) 51   Temp 97.8 F (36.6 C) (Oral)   Ht 6' (1.829 m)   Wt 271 lb 6.4 oz (123.1 kg)   SpO2 97%   BMI 36.81 kg/m   Physical Exam Constitutional:      General: He is not in acute distress.    Appearance: Normal appearance. He is not ill-appearing.  HENT:     Head: Normocephalic and atraumatic.     Right Ear: External ear normal.     Left Ear: External ear normal.     Nose: Nose normal.     Mouth/Throat:     Mouth: Mucous membranes are moist.     Pharynx: Oropharynx is clear. No posterior oropharyngeal erythema.  Eyes:     General: No scleral icterus.    Extraocular Movements: Extraocular movements intact.     Conjunctiva/sclera: Conjunctivae normal.     Pupils: Pupils are equal, round, and reactive to light.  Neck:      Vascular: No carotid bruit.  Cardiovascular:     Rate and Rhythm: Normal rate and regular rhythm.     Pulses: Normal pulses.     Heart sounds: Normal heart sounds. No murmur heard.    No friction rub.  Pulmonary:     Effort: Pulmonary effort is normal. No respiratory distress.  Breath sounds: Normal breath sounds. No wheezing, rhonchi or rales.  Musculoskeletal:        General: Normal range of motion.     Cervical back: Neck supple.     Right lower leg: Edema (2+ pitting) present.     Left lower leg: Edema (2+ pitting) present.  Neurological:     Mental Status: He is alert.         Assessment & Plan:   Encounter to establish care  Essential hypertension Assessment & Plan: Stable-continue current regimen.  Taking BP at home, likely elevated in clinic as it was taken after walking.   PAD (peripheral artery disease) (HCC) Assessment & Plan: Continuing to have some stasis of blood.  No signs of clotting. Continue to follow with vascular. Encouraged patient to exercise. Stable-continue all medications. For redness on the leg, discussed with patient to make sure it is not growing, did not sharpie line for demarcation.  Patient is unconcerned at this time.   Type 2 diabetes mellitus with diabetic peripheral angiopathy without gangrene, without long-term current use of insulin (HCC) Assessment & Plan: Stable and well-controlled-continue current regimen.   Morbid obesity (HCC) Assessment & Plan: Meets criteria due to BMI of greater than 35 with concurrent conditions of hypertension, type 2 diabetes, and PAD.   Class 2 severe obesity due to excess calories with serious comorbidity and body mass index (BMI) of 36.0 to 36.9 in adult [facility]) Assessment & Plan: Serious comorbidities include hypertension, type 2 diabetes, and PAD.    I have spent greater than 60 minutes charting, educating, diagnosing and managing this patient for this visit.   Return in about 6  weeks (around 11/27/2023) for Chronic Followup and discuss labs.Gerilyn Pilgrim T Guadalupe Nickless, [name]

## 2023-10-16 NOTE — Assessment & Plan Note (Signed)
Serious comorbidities include hypertension, type 2 diabetes, and PAD.

## 2023-10-16 NOTE — Patient Instructions (Signed)
It was nice to see you today!  As we discussed in clinic continue to take your medications as directed.    Let me know if the redness on your leg does not clear up.  If you have any problems before your next visit feel free to message me via MyChart (minor issues or questions) or call the office, otherwise you may reach out to schedule an office visit.  Thank you! Gerilyn Pilgrim Heydy Montilla, [name]

## 2023-10-16 NOTE — Assessment & Plan Note (Signed)
Stable and well-controlled-continue current regimen.

## 2023-10-16 NOTE — Assessment & Plan Note (Signed)
Stable-continue current regimen.  Taking BP at home, likely elevated in clinic as it was taken after walking.

## 2023-10-16 NOTE — Assessment & Plan Note (Signed)
Meets criteria due to BMI of greater than 35 with concurrent conditions of hypertension, type 2 diabetes, and PAD.

## 2023-10-17 LAB — COMPREHENSIVE METABOLIC PANEL
ALT: 25 [IU]/L (ref 0–44)
AST: 25 [IU]/L (ref 0–40)
Albumin: 4.3 g/dL (ref 3.8–4.8)
Alkaline Phosphatase: 84 [IU]/L (ref 44–121)
BUN/Creatinine Ratio: 20 (ref 10–24)
BUN: 20 mg/dL (ref 8–27)
Bilirubin Total: 0.4 mg/dL (ref 0.0–1.2)
CO2: 21 mmol/L (ref 20–29)
Calcium: 9.7 mg/dL (ref 8.6–10.2)
Chloride: 104 mmol/L (ref 96–106)
Creatinine, Ser: 1.02 mg/dL (ref 0.76–1.27)
Globulin, Total: 2 g/dL (ref 1.5–4.5)
Glucose: 83 mg/dL (ref 70–99)
Potassium: 4.1 mmol/L (ref 3.5–5.2)
Sodium: 141 mmol/L (ref 134–144)
Total Protein: 6.3 g/dL (ref 6.0–8.5)
eGFR: 78 mL/min/{1.73_m2} (ref >59)

## 2023-10-17 LAB — LIPID PANEL
Chol/HDL Ratio: 2.7 {ratio} (ref 0.0–5.0)
Cholesterol, Total: 121 mg/dL (ref 100–199)
HDL: 45 mg/dL (ref >39)
LDL Chol Calc (NIH): 61 mg/dL (ref 0–99)
Triglycerides: 76 mg/dL (ref 0–149)
VLDL Cholesterol Cal: 15 mg/dL (ref 5–40)

## 2023-10-17 LAB — CBC WITH DIFFERENTIAL/PLATELET
Basophils Absolute: 0.1 10*3/uL (ref 0.0–0.2)
Basos: 1 %
EOS (ABSOLUTE): 0.3 10*3/uL (ref 0.0–0.4)
Eos: 3 %
Hematocrit: 43.7 % (ref 37.5–51.0)
Hemoglobin: 14.5 g/dL (ref 13.0–17.7)
Immature Grans (Abs): 0 10*3/uL (ref 0.0–0.1)
Immature Granulocytes: 0 %
Lymphocytes Absolute: 1.8 10*3/uL (ref 0.7–3.1)
Lymphs: 21 %
MCH: 31 pg (ref 26.6–33.0)
MCHC: 33.2 g/dL (ref 31.5–35.7)
MCV: 93 fL (ref 79–97)
Monocytes Absolute: 0.6 10*3/uL (ref 0.1–0.9)
Monocytes: 8 %
Neutrophils Absolute: 5.6 10*3/uL (ref 1.4–7.0)
Neutrophils: 67 %
Platelets: 157 10*3/uL (ref 150–450)
RBC: 4.68 x10E6/uL (ref 4.14–5.80)
RDW: 12.9 % (ref 11.6–15.4)
WBC: 8.3 10*3/uL (ref 3.4–10.8)

## 2023-10-17 LAB — MICROALBUMIN / CREATININE URINE RATIO
Creatinine, Urine: 25.8 mg/dL
Microalb/Creat Ratio: 22 mg/g{creat} (ref 0–29)
Microalbumin, Urine: 5.7 ug/mL

## 2023-10-17 LAB — HEMOGLOBIN A1C
Est. average glucose Bld gHb Est-mCnc: 140 mg/dL
Hgb A1c MFr Bld: 6.5 % — ABNORMAL HIGH (ref 4.8–5.6)

## 2023-11-06 ENCOUNTER — Ambulatory Visit: Payer: PPO | Admitting: Podiatry

## 2023-11-07 ENCOUNTER — Other Ambulatory Visit: Payer: Self-pay | Admitting: Vascular Surgery

## 2023-11-13 ENCOUNTER — Ambulatory Visit (INDEPENDENT_AMBULATORY_CARE_PROVIDER_SITE_OTHER): Payer: PPO | Admitting: Podiatry

## 2023-11-13 DIAGNOSIS — E1151 Type 2 diabetes mellitus with diabetic peripheral angiopathy without gangrene: Secondary | ICD-10-CM

## 2023-11-13 DIAGNOSIS — M79675 Pain in left toe(s): Secondary | ICD-10-CM | POA: Diagnosis not present

## 2023-11-13 DIAGNOSIS — L84 Corns and callosities: Secondary | ICD-10-CM | POA: Diagnosis not present

## 2023-11-13 DIAGNOSIS — B351 Tinea unguium: Secondary | ICD-10-CM | POA: Diagnosis not present

## 2023-11-13 DIAGNOSIS — M79674 Pain in right toe(s): Secondary | ICD-10-CM | POA: Diagnosis not present

## 2023-11-13 NOTE — Progress Notes (Unsigned)
       Subjective:  Patient ID: Brandon Collins, male    DOB: 01/21/1951,  MRN: [id number]  Brandon Collins presents to clinic today for:  Chief Complaint  Patient presents with   [facility]     [facility] today, Last A1c was 6.5 on 10/16/23. Takes ASA 81.    Patient notes nails are thick and elongated, causing pain in shoe gear when ambulating.  He has painful calluses bilateral submet 5  PCP is [name], Teryl Lucy, [name].  Last seen 10/16/23  Past Medical History:  Diagnosis Date   Arthritis    "all over" (08/12/2018)   Complication of anesthesia 07/2018   "out of it for 2 weeks."  "Low Blood Sugar."   High cholesterol    HOH (hard of hearing)    Hypertension    Peripheral arterial disease (HCC)    Type II diabetes mellitus (HCC)    Ulcer of ankle, right, with unspecified severity (HCC)     No Known Allergies  Objective:  Brandon Collins is a pleasant 73 y.o. male in NAD. AAO x 3.  Vascular Examination: Patient has palpable DP pulse, absent PT pulse bilateral.  Delayed capillary refill bilateral toes.  Sparse digital hair bilateral.  Proximal to distal cooling WNL bilateral.    Dermatological Examination: Interspaces are clear with no open lesions noted bilateral.  Skin is shiny and atrophic bilateral.  Nails are 3-3mm thick, with yellowish/brown discoloration, subungual debris and distal onycholysis x10.  There is pain with compression of nails x10.  There are hyperkeratotic lesions noted bilateral submet 5.     Latest Ref Rng & Units 10/16/2023    2:20 PM  Hemoglobin A1C  Hemoglobin-A1c 4.8 - 5.6 % 6.5    Patient qualifies for at-risk foot care because of diabetes with PVD.  Assessment/Plan: 1. Pain due to onychomycosis of toenails of both feet   2. Callus   3. Type II diabetes mellitus with peripheral circulatory disorder (HCC)    Mycotic nails x10 were sharply debrided with sterile nail nippers and power debriding burr to decrease bulk and length.  Hyperkeratotic lesions x 2 on  bilateral submet 5 were shaved with #312 blade.  Recommended formula 7 solution for the fungal toenails.  He needs to apply this once daily for up to 1 year continuously.  Return in about 3 months (around 02/10/2024) for [facility].   Clerance Lav, DPM, FACFAS [facility]     [address] [place], [place] [address]                Office [phone]  Fax [phone]

## 2023-11-19 ENCOUNTER — Other Ambulatory Visit (HOSPITAL_BASED_OUTPATIENT_CLINIC_OR_DEPARTMENT_OTHER): Payer: Self-pay | Admitting: Student

## 2023-11-27 ENCOUNTER — Encounter (HOSPITAL_BASED_OUTPATIENT_CLINIC_OR_DEPARTMENT_OTHER): Payer: Self-pay | Admitting: Student

## 2023-11-27 ENCOUNTER — Ambulatory Visit (HOSPITAL_BASED_OUTPATIENT_CLINIC_OR_DEPARTMENT_OTHER): Payer: Self-pay | Admitting: Student

## 2023-11-27 VITALS — BP 161/70 | HR 54 | Temp 97.5°F | Ht 72.0 in | Wt 271.3 lb

## 2023-11-27 DIAGNOSIS — E1151 Type 2 diabetes mellitus with diabetic peripheral angiopathy without gangrene: Secondary | ICD-10-CM | POA: Diagnosis not present

## 2023-11-27 DIAGNOSIS — I739 Peripheral vascular disease, unspecified: Secondary | ICD-10-CM | POA: Diagnosis not present

## 2023-11-27 DIAGNOSIS — I10 Essential (primary) hypertension: Secondary | ICD-10-CM

## 2023-11-27 LAB — GLUCOSE, POCT (MANUAL RESULT ENTRY): POC Glucose: 89 mg/dL (ref 70–99)

## 2023-11-27 MED ORDER — CHLORTHALIDONE 25 MG PO TABS: 25.0000 mg | ORAL_TABLET | Freq: Every day | ORAL | 5 refills | Status: DC

## 2023-11-27 NOTE — Assessment & Plan Note (Signed)
 Continuing to have some stasis of blood.  No signs of clotting. Continue to follow with vascular. Stable-continue all medications. Redness of the leg appears to be stable-possible hemosiderin deposition.

## 2023-11-27 NOTE — Assessment & Plan Note (Signed)
 Patient is not taking BP at home, consistently elevated.  BP in clinic was 161/70. Discontinue HCTZ 25 mg tablet. Begin chlorthalidone 25 mg tablet daily. Recommended taking BP regularly at home and following up with results.

## 2023-11-27 NOTE — Patient Instructions (Addendum)
 It was nice to see you today!  As we discussed in clinic I will send in a medication for you called chlorthalidone at 25 mg-I would like for you to take your blood pressure about twice daily for the next week or 2 and give me a call to let me know how the readings are going.  The chlorthalidone will take the place of the hydrochlorothiazide-so please stop taking the hydrochlorothiazide at the same time.  Please make sure to drink plenty of water and!  If you have any problems before your next visit feel free to message me via MyChart (minor issues or questions) or call the office, otherwise you may reach out to schedule an office visit.  Thank you! Gerilyn Pilgrim Sharyon Peitz, [name]

## 2023-11-27 NOTE — Assessment & Plan Note (Signed)
 Stable continue current regimen.

## 2023-11-27 NOTE — Progress Notes (Signed)
 Established Patient Office Visit  Subjective   Patient ID: Brandon Collins, male    DOB: 05-Nov-1950  Age: 73 y.o. MRN: [id number]  Chief Complaint  Patient presents with   Medical Management of Chronic Issues    Here for follow up. Last BS reading was around 80's-90's. Does not check regularly. Worries about the shakes when sugar drops but does not keep eye on it. Does not check BP.     HPI  Hypertension- Denies dizziness, chest pain, or shortness of breath.  BP in clinic was 161/70.  States that he does not check it at home.  Last saw Dr. Egbert Garibaldi about 6 months ago. Dx around age 59. He is prescribed Lisinopril 40mg , amlodipine 10mg  (only takes about 5mg ), hydrochlorothiazide 25mg . Patient also takes atenolol 50mg .  Patient is agreeable to switching HCTZ to chlorthalidone.  Recommended patient begin checking BP at home and let me know in 1 to 2 weeks of his results.   Type 2 diabetes- patient is currently taking glimepiride and pioglitazone. Does not check BG at home often. Has not had any episodes of hypoglycemia in the last 4 years. Last several times A1c was checked, patient notes that his A1c was under 7.  Last A1c on 10/16/2023 was 6.5.   PAD-continues to follow with vascular for surveillance of PAD.  Last seen 08/21/2023-Per note, duplex done that day demonstrated patent bypass graft and patent stent in the proximal portion of right leg.  Planning to repeat right leg bypass duplex and ABI in 6 months. On aspirin, Plavix, and Lipitor.   Per vascular surgery note: 01/27/2019- Right common femoral artery endarterectomy and femoral to below the knee popliteal bypass with PTFE by Dr. Randie Heinz.  10/2019- Required laser atherectomy of the right common femoral artery and balloon angioplasty of the proximal graft to treat proximal anastomosis stenosis. 10/2020- Required repeat balloon angioplasty of the proximal graft for restenosis.  10/08/2022- Underwent stenting of the proximal bypass graft due to  recurrent stenosis.    This visit: No major changes in leg, slightly improved.     ROS Negative unless indicated in HPI.    Objective:     BP (!) 161/70 (BP Location: Right Arm, Patient Position: Sitting, Cuff Size: Normal)   Pulse (!) 54   Temp (!) 97.5 F (36.4 C) (Oral)   Ht 6' (1.829 m)   Wt 271 lb 4.8 oz (123.1 kg)   SpO2 97%   BMI 36.79 kg/m    Physical Exam Constitutional:      General: He is not in acute distress.    Appearance: Normal appearance. He is not ill-appearing.  HENT:     Head: Normocephalic and atraumatic.     Right Ear: External ear normal.     Left Ear: External ear normal.     Nose: Nose normal.  Eyes:     Conjunctiva/sclera: Conjunctivae normal.  Cardiovascular:     Rate and Rhythm: Normal rate and regular rhythm.     Pulses: Normal pulses.     Heart sounds: Normal heart sounds. No murmur heard.    No friction rub.  Pulmonary:     Effort: Pulmonary effort is normal. No respiratory distress.     Breath sounds: Normal breath sounds. No wheezing, rhonchi or rales.  Skin:    General: Skin is warm and dry.     Coloration: Skin is not jaundiced or pale.  Neurological:     Mental Status: He is alert.  Psychiatric:  Mood and Affect: Mood normal.        Behavior: Behavior normal.      Results for orders placed or performed in visit on 11/27/23  POCT glucose (manual entry)  Result Value Ref Range   POC Glucose 89 70 - 99 mg/dl      The ASCVD Risk score (Arnett DK, et al., 2019) failed to calculate for the following reasons:   The valid total cholesterol range is 130 to 320 mg/dL    Assessment & Plan:   Essential hypertension Assessment & Plan: Patient is not taking BP at home, consistently elevated.  BP in clinic was 161/70. Discontinue HCTZ 25 mg tablet. Begin chlorthalidone 25 mg tablet daily. Recommended taking BP regularly at home and following up with results.  Orders: -     Chlorthalidone; Take 1 tablet (25 mg  total) by mouth daily.  Dispense: 30 tablet; Refill: 5  Type 2 diabetes mellitus with diabetic peripheral angiopathy without gangrene, without long-term current use of insulin (HCC) Assessment & Plan: Stable-continue current regimen.  Orders: -     POCT glucose (manual entry)  PAD (peripheral artery disease) (HCC) Assessment & Plan: Continuing to have some stasis of blood.  No signs of clotting. Continue to follow with vascular. Stable-continue all medications. Redness of the leg appears to be stable-possible hemosiderin deposition.    I have spent greater than 30 minutes charting, educating, diagnosing and managing this patient for this visit.   Return in about 3 months (around 02/27/2024) for HTN, DM.    Teryl Lucy Owen Pratte, [name]

## 2024-01-01 ENCOUNTER — Other Ambulatory Visit (HOSPITAL_BASED_OUTPATIENT_CLINIC_OR_DEPARTMENT_OTHER): Payer: Self-pay | Admitting: Student

## 2024-01-01 NOTE — Telephone Encounter (Unsigned)
 Copied from CRM [id number]. Topic: Clinical - Medication Refill >> Jan 01, 2024  3:45 PM Baldomero Bone wrote: Most Recent Primary Care Visit:  Provider: [name], JACOB T  Department: [facility] CARE  Visit Type: OFFICE VISIT  Date: 11/27/2023  Medication: pioglitazone (ACTOS) 15 MG tablet  Has the patient contacted their pharmacy? Yes (Agent: If no, request that the patient contact the pharmacy for the refill. If patient does not wish to contact the pharmacy document the reason why and proceed with request.) (Agent: If yes, when and what did the pharmacy advise?)  Is this the correct pharmacy for this prescription? Yes If no, delete pharmacy and type the correct one.  This is the patient's preferred pharmacy:  [id number] - [place], [place] - [address] [place] [address] Phone: [phone] Fax: [phone]   Has the prescription been filled recently? No  Is the patient out of the medication? No  Has the patient been seen for an appointment in the last year OR does the patient have an upcoming appointment? Yes  Can we respond through MyChart? No  Agent: Please be advised that Rx refills may take up to 3 business days. We ask that you follow-up with your pharmacy.

## 2024-01-02 MED ORDER — PIOGLITAZONE HCL 15 MG PO TABS
15.0000 mg | ORAL_TABLET | Freq: Every day | ORAL | 2 refills | Status: DC
  Filled 2024-05-28 – 2024-06-08 (×2): qty 90, 90d supply, fill #0

## 2024-01-06 ENCOUNTER — Other Ambulatory Visit (HOSPITAL_BASED_OUTPATIENT_CLINIC_OR_DEPARTMENT_OTHER): Payer: Self-pay | Admitting: Student

## 2024-01-06 NOTE — Telephone Encounter (Unsigned)
 Copied from CRM [id number]. Topic: Clinical - Medication Refill >> Jan 06, 2024  2:20 PM Baldomero Bone wrote: Most Recent Primary Care Visit:  Provider: Turner Gains T  Department: [facility] CARE  Visit Type: OFFICE VISIT  Date: 11/27/2023  Medication: glimepiride  (AMARYL ) 4 MG tablet lisinopril  (ZESTRIL ) 40 MG tablet   Has the patient contacted their pharmacy? No (Agent: If no, request that the patient contact the pharmacy for the refill. If patient does not wish to contact the pharmacy document the reason why and proceed with request.) (Agent: If yes, when and what did the pharmacy advise?)  Is this the correct pharmacy for this prescription? Yes If no, delete pharmacy and type the correct one.  This is the patient's preferred pharmacy:  [id number] - [place], [place] - [address] [place] [address] Phone: [phone] Fax: [phone]   Has the prescription been filled recently? No  Is the patient out of the medication? No  Has the patient been seen for an appointment in the last year OR does the patient have an upcoming appointment? Yes  Can we respond through MyChart? No  Agent: Please be advised that Rx refills may take up to 3 business days. We ask that you follow-up with your pharmacy.

## 2024-01-07 MED ORDER — LISINOPRIL 40 MG PO TABS
40.0000 mg | ORAL_TABLET | Freq: Every day | ORAL | 3 refills | Status: AC
  Filled 2024-05-28 – 2024-06-08 (×2): qty 90, 90d supply, fill #0
  Filled 2024-10-06: qty 90, 90d supply, fill #1

## 2024-01-07 MED ORDER — GLIMEPIRIDE 4 MG PO TABS: 4.0000 mg | ORAL_TABLET | Freq: Every day | ORAL | 3 refills | Status: DC

## 2024-01-13 ENCOUNTER — Other Ambulatory Visit (HOSPITAL_BASED_OUTPATIENT_CLINIC_OR_DEPARTMENT_OTHER): Payer: Self-pay | Admitting: Student

## 2024-01-13 NOTE — Telephone Encounter (Unsigned)
 Copied from CRM [id number]. Topic: Clinical - Medication Refill >> Jan 13, 2024  3:06 PM Brandon Collins wrote: Most Recent Primary Care Visit:  Provider: Turner Gains T  Department: [facility] CARE  Visit Type: OFFICE VISIT  Date: 11/27/2023  Medication: glimepiride  (AMARYL ) 4 MG tablet  Has the patient contacted their pharmacy? No (Agent: If no, request that the patient contact the pharmacy for the refill. If patient does not wish to contact the pharmacy document the reason why and proceed with request.) (Agent: If yes, when and what did the pharmacy advise?)  Is this the correct pharmacy for this prescription? Yes If no, delete pharmacy and type the correct one.  This is the patient's preferred pharmacy:  [id number] - [place], [place] - [address] [place] [address] Phone: [phone] Fax: [phone]   Has the prescription been filled recently? No  Is the patient out of the medication? Yes  Has the patient been seen for an appointment in the last year OR does the patient have an upcoming appointment? Yes  Can we respond through MyChart? No  Agent: Please be advised that Rx refills may take up to 3 business days. We ask that you follow-up with your pharmacy.

## 2024-01-14 ENCOUNTER — Other Ambulatory Visit (HOSPITAL_BASED_OUTPATIENT_CLINIC_OR_DEPARTMENT_OTHER): Payer: Self-pay | Admitting: Student

## 2024-01-14 NOTE — Telephone Encounter (Signed)
 Copied from CRM [id number]. Topic: Clinical - Medication Refill >> Jan 14, 2024  4:28 PM Rosaria Common wrote: Most Recent Primary Care Visit:  Provider: Turner Gains T  Department: [facility] CARE  Visit Type: OFFICE VISIT  Date: 11/27/2023  Medication: glimepiride  (AMARYL ) 4 MG tablet  Has the patient contacted their pharmacy? Yes (Agent: If no, request that the patient contact the pharmacy for the refill. If patient does not wish to contact the pharmacy document the reason why and proceed with request.) (Agent: If yes, when and what did the pharmacy advise?)  Is this the correct pharmacy for this prescription? Yes If no, delete pharmacy and type the correct one.  This is the patient's preferred pharmacy:  [id number] - [place], [place] - [address] [place] [address] Phone: [phone] Fax: [phone]   Has the prescription been filled recently? No  Is the patient out of the medication? Yes  Has the patient been seen for an appointment in the last year OR does the patient have an upcoming appointment? Yes  Can we respond through MyChart? Yes  Agent: Please be advised that Rx refills may take up to 3 business days. We ask that you follow-up with your pharmacy.

## 2024-01-15 ENCOUNTER — Other Ambulatory Visit (HOSPITAL_BASED_OUTPATIENT_CLINIC_OR_DEPARTMENT_OTHER): Payer: Self-pay

## 2024-01-15 DIAGNOSIS — E1151 Type 2 diabetes mellitus with diabetic peripheral angiopathy without gangrene: Secondary | ICD-10-CM

## 2024-01-15 MED ORDER — GLIMEPIRIDE 4 MG PO TABS
4.0000 mg | ORAL_TABLET | Freq: Every day | ORAL | 3 refills | Status: AC
  Filled 2024-05-28 – 2024-06-08 (×2): qty 90, 90d supply, fill #0
  Filled 2024-10-14: qty 90, 90d supply, fill #1

## 2024-02-12 ENCOUNTER — Ambulatory Visit (INDEPENDENT_AMBULATORY_CARE_PROVIDER_SITE_OTHER): Payer: PPO | Admitting: Podiatry

## 2024-02-12 DIAGNOSIS — E1151 Type 2 diabetes mellitus with diabetic peripheral angiopathy without gangrene: Secondary | ICD-10-CM

## 2024-02-12 DIAGNOSIS — L84 Corns and callosities: Secondary | ICD-10-CM | POA: Diagnosis not present

## 2024-02-12 DIAGNOSIS — M79674 Pain in right toe(s): Secondary | ICD-10-CM | POA: Diagnosis not present

## 2024-02-12 DIAGNOSIS — M79675 Pain in left toe(s): Secondary | ICD-10-CM | POA: Diagnosis not present

## 2024-02-12 DIAGNOSIS — B351 Tinea unguium: Secondary | ICD-10-CM | POA: Diagnosis not present

## 2024-02-12 NOTE — Progress Notes (Signed)
    Subjective:  Patient ID: Brandon Collins, male    DOB: Mar 13, 1951,  MRN: [id number]  Brandon Collins presents to clinic today for:  Chief Complaint  Patient presents with   [facility]    [facility] with possible callous. Last A1c was 6.5 in Jan takes Plavix .    Patient notes nails are thick and elongated, causing pain in shoe gear when ambulating.  He also has painful calluses bilateral submet 5.  He notes his nails are brittle.  PCP is Brandon Collins, Brandon T, [name].  Last seen 11/27/2023  Past Medical History:  Diagnosis Date   Arthritis    "all over" (08/12/2018)   Complication of anesthesia 07/2018   "out of it for 2 weeks."  "Low Blood Sugar."   High cholesterol    HOH (hard of hearing)    Hypertension    Peripheral arterial disease (HCC)    Type II diabetes mellitus (HCC)    Ulcer of ankle, right, with unspecified severity (HCC)    No Known Allergies  Objective:  Brandon Collins is a pleasant 73 y.o. male in NAD. AAO x 3.  Vascular Examination: Patient has palpable DP pulse, absent PT pulse bilateral.  Delayed capillary refill bilateral toes.  Sparse digital hair bilateral.  Proximal to distal cooling WNL bilateral.    Dermatological Examination: Interspaces are clear with no open lesions noted bilateral.  Skin is shiny and atrophic bilateral.  Nails are 3-76mm thick, with yellowish/brown discoloration, subungual debris and distal onycholysis x10.  There is pain with compression of nails x10.  There are hyperkeratotic lesions noted bilateral submet 5.     Latest Ref Rng & Units 10/16/2023    2:20 PM  Hemoglobin A1C  Hemoglobin-A1c 4.8 - 5.6 % 6.5    Patient qualifies for at-risk foot care because of diabetes with mild PVD.  Assessment/Plan: 1. Pain due to onychomycosis of toenails of both feet   2. Callus   3. Type II diabetes mellitus with peripheral circulatory disorder (HCC)     Mycotic nails x10 were sharply debrided with sterile nail nippers and power debriding burr to decrease bulk  and length.  Hyperkeratotic lesions x 2, bilateral submet 5, were shaved with #312 blade.  I was informed by our billing manager today that a health team advantage prior authorization form does not need to be completed with his insurance for his callus shaving unless there are 5 or more calluses present.  Follow-up 3 months   Bashar Milam DEstle Hemp, DPM, FACFAS [facility]     [address] [place], [place] [address]                Office [phone]  Fax [phone]

## 2024-02-26 ENCOUNTER — Ambulatory Visit (HOSPITAL_COMMUNITY)
Admission: RE | Admit: 2024-02-26 | Discharge: 2024-02-26 | Disposition: A | Discharge status: Home / Self Care | Payer: Medicare HMO | Source: Ambulatory Visit | Attending: Vascular Surgery | Admitting: Vascular Surgery

## 2024-02-26 ENCOUNTER — Ambulatory Visit: Payer: Medicare HMO | Admitting: Physician Assistant

## 2024-02-26 VITALS — BP 168/92 | HR 53 | Temp 98.3°F | Ht 72.0 in | Wt 271.7 lb

## 2024-02-26 DIAGNOSIS — I739 Peripheral vascular disease, unspecified: Secondary | ICD-10-CM | POA: Diagnosis not present

## 2024-02-26 DIAGNOSIS — M7989 Other specified soft tissue disorders: Secondary | ICD-10-CM | POA: Diagnosis present

## 2024-02-26 LAB — VAS US ABI WITH/WO TBI: Left ABI: 0.63

## 2024-02-26 NOTE — Progress Notes (Signed)
 Office Note     CC:  follow up Requesting Provider:  Lucius Sabins., MD  HPI: Brandon Collins is a 73 y.o. (04/25/1951) male who presents for surveillance of PAD.  He has surgical history significant for right common femoral endarterectomy with femoral to below the knee popliteal bypass with PTFE by Dr. Vikki Graves in 2020.  In February 2021 he required laser atherectomy of the right common femoral artery and balloon angioplasty of the proximal graft to treat proximal anastomosis stenosis.  In February 2022 he required repeat balloon angioplasty of the proximal graft for restenosis.  He also required stenting of the proximal bypass graft on 10/08/2022 due to recurrent stenosis.  He has a known left SFA occlusion.  He denies any current rest pain or tissue loss.  He is on an aspirin , Plavix , statin daily.  He is ambulatory with a cane.  He occasionally wears a knee-high compression sock on his right leg due to chronic swelling.    Past Medical History:  Diagnosis Date   Arthritis    all over (08/12/2018)   Complication of anesthesia 07/2018   out of it for 2 weeks.  Low Blood Sugar.   High cholesterol    HOH (hard of hearing)    Hypertension    Peripheral arterial disease (HCC)    Type II diabetes mellitus (HCC)    Ulcer of ankle, right, with unspecified severity [facility])     Past Surgical History:  Procedure Laterality Date   ABDOMINAL AORTOGRAM W/LOWER EXTREMITY N/A 01/22/2019   Procedure: ABDOMINAL AORTOGRAM W/LOWER EXTREMITY;  Surgeon: Adine Hoof, MD;  Location: [facility] INVASIVE CV LAB;  Service: Cardiovascular;  Laterality: N/A;  rt leg   ABDOMINAL AORTOGRAM W/LOWER EXTREMITY N/A 10/26/2019   Procedure: ABDOMINAL AORTOGRAM W/LOWER EXTREMITY;  Surgeon: Adine Hoof, MD;  Location: [facility] INVASIVE CV LAB;  Service: Cardiovascular;  Laterality: N/A;  Rt lower extermity    ABDOMINAL AORTOGRAM W/LOWER EXTREMITY Bilateral 09/26/2020   Procedure: ABDOMINAL AORTOGRAM W/LOWER  EXTREMITY;  Surgeon: Adine Hoof, MD;  Location: [facility] INVASIVE CV LAB;  Service: Cardiovascular;  Laterality: Bilateral;   ABDOMINAL AORTOGRAM W/LOWER EXTREMITY N/A 10/08/2022   Procedure: ABDOMINAL AORTOGRAM W/LOWER EXTREMITY;  Surgeon: Adine Hoof, MD;  Location: [facility] INVASIVE CV LAB;  Service: Cardiovascular;  Laterality: N/A;   ANTERIOR CERVICAL DECOMP/DISCECTOMY FUSION  08/12/2018   CERVICAL 3-4, CERVICAL 4-5, CERVICAL 5-6, CERVICAL 6-7 WITH INSTRUMENTATION AND ALLOGRAFT   ANTERIOR CERVICAL DECOMPRESSION/DISCECTOMY FUSION 4 LEVELS N/A 08/12/2018   Procedure: ANTERIOR CERVICAL DECOMPRESSION FUSION CERVICAL 3-4, CERVICAL 4-5, CERVICAL 5-6, CERVICAL 6-7 WITH INSTRUMENTATION AND ALLOGRAFT;  Surgeon: Virl Grimes, MD;  Location: [facility] OR;  Service: Orthopedics;  Laterality: N/A;  ANTERIOR CERVICAL DECOMPRESSION FUSION CERVICAL 3-4, CERVICAL 4-5, CERVICAL 5-6, CERVICAL 6-7 WITH INSTRUMENTATION AND ALLOGRAFT   COLONOSCOPY W/ POLYPECTOMY     FEMORAL-POPLITEAL BYPASS GRAFT Right 01/27/2019   Procedure: right FEMORAL ENDARTERECTOMY, RIGHT  FEMORAL- BELOW THE KNEE POPLITEAL ARTERY BYPASS GRAFT using 6mm propaten ringed gore graft, attempted right femoral to below knee popliteal artery bypass using right nonreversed great saphenous vein;  Surgeon: Adine Hoof, MD;  Location: [facility] OR;  Service: Vascular;  Laterality: Right;   INGUINAL HERNIA REPAIR  1958   ? side   KNEE ARTHROSCOPY Right    PERIPHERAL VASCULAR ATHERECTOMY  10/26/2019   Procedure: PERIPHERAL VASCULAR ATHERECTOMY;  Surgeon: Adine Hoof, MD;  Location: [facility] INVASIVE CV LAB;  Service: Cardiovascular;;  LASER   PERIPHERAL VASCULAR BALLOON ANGIOPLASTY Right  09/26/2020   Procedure: PERIPHERAL VASCULAR BALLOON ANGIOPLASTY;  Surgeon: Adine Hoof, MD;  Location: [facility] INVASIVE CV LAB;  Service: Cardiovascular;  Laterality: Right;  SFA  FEMPOP BYPASS GRAFT   PERIPHERAL VASCULAR INTERVENTION Right  10/08/2022   Procedure: PERIPHERAL VASCULAR INTERVENTION;  Surgeon: Adine Hoof, MD;  Location: [facility] INVASIVE CV LAB;  Service: Cardiovascular;  Laterality: Right;  Fem-pop bypass   PILONIDAL CYST EXCISION      Social History   Socioeconomic History   Marital status: Married    Spouse name: Not on file   Number of children: 1   Years of education: Not on file   Highest education level: Not on file  Occupational History   Not on file  Tobacco Use   Smoking status: Former    Current packs/day: 1.00    Average packs/day: 1 pack/day for 30.0 years (30.0 ttl pk-yrs)    Types: Cigarettes    Passive exposure: Never   Smokeless tobacco: Never   Tobacco comments:    quit in late 1990's  Vaping Use   Vaping status: Never Used  Substance and Sexual Activity   Alcohol use: Not Currently    Comment: 08/12/2018 nothing in the 2000s   Drug use: Never   Sexual activity: Not Currently  Other Topics Concern   Not on file  Social History Narrative   1 daughter & 2 step children    Social Drivers of Corporate investment banker Strain: Not on file  Food Insecurity: No Food Insecurity (10/16/2023)   Hunger Vital Sign    Worried About Running Out of Food in the Last Year: Never true    Ran Out of Food in the Last Year: Never true  Transportation Needs: No Transportation Needs (10/16/2023)   PRAPARE - Administrator, Civil Service (Medical): No    Lack of Transportation (Non-Medical): No  Physical Activity: Not on file  Stress: Not on file  Social Connections: Not on file  Intimate Partner Violence: Not At Risk (10/16/2023)   Humiliation, Afraid, Rape, and Kick questionnaire    Fear of Current or Ex-Partner: No    Emotionally Abused: No    Physically Abused: No    Sexually Abused: No    Family History  Problem Relation Age of Onset   Cancer Mother        multiple types   Stroke Father        multiple    Current Outpatient Medications  Medication Sig  Dispense Refill   Accu-Chek Softclix Lancets lancets daily.     amLODipine  (NORVASC ) 10 MG tablet Take 10 mg by mouth every other day.     aspirin  EC 81 MG tablet Take 81 mg by mouth daily.     atenolol (TENORMIN) 50 MG tablet TAKE 1 TABLET BY MOUTH EVERY DAY FOR 90 DAYS 90 tablet 1   atorvastatin  (LIPITOR) 20 MG tablet Take 20 mg by mouth at bedtime.     Blood Glucose Monitoring Suppl (ACCU-CHEK GUIDE) w/Device KIT See admin instructions.     chlorthalidone  (HYGROTON ) 25 MG tablet Take 1 tablet (25 mg total) by mouth daily. 30 tablet 5   cholecalciferol  (VITAMIN D ) 1000 units tablet Take 1 tablet (1,000 Units total) by mouth daily.     clopidogrel  (PLAVIX ) 75 MG tablet TAKE 1 TABLET BY MOUTH EVERY DAY 90 tablet 3   glimepiride  (AMARYL ) 4 MG tablet Take 1 tablet (4 mg total) by mouth daily with breakfast. 90 tablet 3  lisinopril  (ZESTRIL ) 40 MG tablet Take 1 tablet (40 mg total) by mouth daily. 90 tablet 3   loratadine-pseudoephedrine (CLARITIN-D 24-HOUR) 10-240 MG 24 hr tablet Take 1 tablet by mouth daily.     ONETOUCH ULTRA test strip USE TO TEST FASTING SUGAR ONCE DAILY IN THE MORNING     pioglitazone  (ACTOS ) 15 MG tablet Take 1 tablet (15 mg total) by mouth daily. 90 tablet 2   vitamin C (ASCORBIC ACID) 250 MG tablet Take 250 mg by mouth daily.     No current facility-administered medications for this visit.    No Known Allergies   REVIEW OF SYSTEMS:   [X]  denotes positive finding, [ ]  denotes negative finding Cardiac  Comments:  Chest pain or chest pressure:    Shortness of breath upon exertion:    Short of breath when lying flat:    Irregular heart rhythm:        Vascular    Pain in calf, thigh, or hip brought on by ambulation:    Pain in feet at night that wakes you up from your sleep:     Blood clot in your veins:    Leg swelling:         Pulmonary    Oxygen at home:    Productive cough:     Wheezing:         Neurologic    Sudden weakness in arms or legs:      Sudden numbness in arms or legs:     Sudden onset of difficulty speaking or slurred speech:    Temporary loss of vision in one eye:     Problems with dizziness:         Gastrointestinal    Blood in stool:     Vomited blood:         Genitourinary    Burning when urinating:     Blood in urine:        Psychiatric    Major depression:         Hematologic    Bleeding problems:    Problems with blood clotting too easily:        Skin    Rashes or ulcers:        Constitutional    Fever or chills:      PHYSICAL EXAMINATION:  Vitals:   02/26/24 1338  BP: (!) 168/92  Pulse: (!) 53  Temp: 98.3 F (36.8 C)  TempSrc: Temporal  SpO2: 98%  Weight: 271 lb 11.2 oz (123.2 kg)  Height: 6' (1.829 m)    General:  WDWN in NAD; vital signs documented above Gait: Not observed HENT: WNL, normocephalic Pulmonary: normal non-labored breathing Cardiac: regular HR Abdomen: soft, NT, no masses Skin: without rashes Vascular Exam/Pulses: absent pedal pulses Extremities: without ischemic changes, without Gangrene , without cellulitis; without open wounds;  Musculoskeletal: no muscle wasting or atrophy  Neurologic: A&O X 3 Psychiatric:  The pt has Normal affect.   Non-Invasive Vascular Imaging:   Right leg bypass duplex demonstrates a widely patent bypass  230 cm/s in the proximal stent, stable    ASSESSMENT/PLAN:: 73 y.o. male here for follow up for surveillance of right leg bypass  Subjectively, Mr. Busby has been doing well since last office visit.  He denies any current rest pain or tissue loss of bilateral lower extremities.  He has had numerous interventions of his proximal bypass anastomosis over the years.  Findings however over the last 2 visits have been stable.  He has mildly elevated velocities in the proximal portion of the stent in the proximal bypass.  ABIs and TBI's are unchanged.  He will continue his aspirin , Plavix , statin daily.  We will repeat imaging studies in 1  year.  He knows to contact our office with any questions or concerns.  I also encouraged him to wear a knee-high compression sock on his right leg on a regular basis.   Cordie Deters, [name] [facility] [phone]  Clinic MD:   Vikki Graves

## 2024-02-27 ENCOUNTER — Encounter (HOSPITAL_BASED_OUTPATIENT_CLINIC_OR_DEPARTMENT_OTHER): Payer: Self-pay

## 2024-02-27 ENCOUNTER — Encounter (HOSPITAL_BASED_OUTPATIENT_CLINIC_OR_DEPARTMENT_OTHER): Payer: Self-pay | Admitting: Student

## 2024-02-27 ENCOUNTER — Ambulatory Visit (INDEPENDENT_AMBULATORY_CARE_PROVIDER_SITE_OTHER): Admitting: Student

## 2024-02-27 VITALS — BP 154/76 | HR 48 | Temp 98.1°F | Resp 16 | Ht 72.0 in | Wt 270.2 lb

## 2024-02-27 DIAGNOSIS — I10 Essential (primary) hypertension: Secondary | ICD-10-CM

## 2024-02-27 DIAGNOSIS — I739 Peripheral vascular disease, unspecified: Secondary | ICD-10-CM | POA: Diagnosis not present

## 2024-02-27 DIAGNOSIS — Z13220 Encounter for screening for lipoid disorders: Secondary | ICD-10-CM

## 2024-02-27 DIAGNOSIS — E1151 Type 2 diabetes mellitus with diabetic peripheral angiopathy without gangrene: Secondary | ICD-10-CM

## 2024-02-27 DIAGNOSIS — G629 Polyneuropathy, unspecified: Secondary | ICD-10-CM | POA: Diagnosis not present

## 2024-02-27 DIAGNOSIS — Z7984 Long term (current) use of oral hypoglycemic drugs: Secondary | ICD-10-CM

## 2024-02-27 DIAGNOSIS — Z136 Encounter for screening for cardiovascular disorders: Secondary | ICD-10-CM

## 2024-02-27 MED ORDER — AMLODIPINE BESYLATE 10 MG PO TABS
10.0000 mg | ORAL_TABLET | ORAL | 3 refills | Status: AC
  Filled 2024-05-28: qty 50, 100d supply, fill #0
  Filled 2024-10-14: qty 50, 100d supply, fill #1

## 2024-02-27 NOTE — Progress Notes (Signed)
 Established Patient Office Visit  Subjective   Patient ID: Brandon Collins, male    DOB: Dec 07, 1950  Age: 73 y.o. MRN: [id number]  Chief Complaint  Patient presents with   Medical Management of Chronic Issues    Here for follow up. Refill needed for amlodipine . AWVS & CT lung screening. BP ranging 119-158/62-72.     HPI  Discussed the use of AI scribe software for clinical note transcription with the patient, who gave verbal consent to proceed.  History of Present Illness   The patient presents for a follow-up visit regarding hypertension management and medication refills.  They have been monitoring their blood pressure at home using a wrist cuff, noting generally good readings except for occasional elevations after physical activity. Blood pressure tends to rise with exertion but usually stabilizes after resting. They require a refill of amlodipine , which they take regularly.  Their blood sugar levels fluctuate, with occasional high readings following late-night eating, such as consuming chips and salsa. They try to avoid eating after supper to maintain stable blood sugar levels.  They have a history of vascular issues and have been seeing a vascular specialist for stent monitoring every six months. The last visit showed clear blood flow, and they are now scheduled for annual follow-ups. No non-healing ulcers are reported, and they mention minor injuries from household activities.  They experience swelling in their right leg, which they manage by elevating their feet. Swelling increases after physical activity, such as walking extensively at a hospital. They report feeling sensation in their feet, although the right foot is less responsive when swollen.  They quit smoking over 30 years ago, around the time their grandson was born, and have had two colonoscopies, the last one in 2019, with no need for further screenings according to previous advice.      Patient Active Problem List    Diagnosis Date Noted   Neuropathy 02/28/2024   Class 2 severe obesity due to excess calories with serious comorbidity and body mass index (BMI) of 36.0 to 36.9 in adult [facility]) 10/16/2023   Morbid obesity (HCC) 10/16/2023   Flexion contracture of knee, right 08/18/2019   PAD (peripheral artery disease) (HCC) 01/27/2019   Ankle ulcer, right, limited to breakdown of skin (HCC) 10/21/2018   Primary osteoarthritis of right knee 10/21/2018   Cervical myelopathy (HCC) 08/19/2018   Cervical cord compression with myelopathy (HCC)    T2DM (type 2 diabetes mellitus) (HCC)    Essential hypertension    Dyslipidemia    Drug induced constipation    Myelopathy (HCC) 08/12/2018   Degeneration of lumbar intervertebral disc 08/05/2018   Low back pain 08/05/2018   Neck pain 08/05/2018   Past Medical History:  Diagnosis Date   Arthritis    all over (08/12/2018)   Complication of anesthesia 07/2018   out of it for 2 weeks.  Low Blood Sugar.   High cholesterol    HOH (hard of hearing)    Hypertension    Peripheral arterial disease (HCC)    Type II diabetes mellitus (HCC)    Ulcer of ankle, right, with unspecified severity (HCC)    Social History   Tobacco Use   Smoking status: Former    Current packs/day: 1.00    Average packs/day: 1 pack/day for 30.0 years (30.0 ttl pk-yrs)    Types: Cigarettes    Passive exposure: Never   Smokeless tobacco: Never   Tobacco comments:    quit in late 1990's  Vaping Use  Vaping status: Never Used  Substance Use Topics   Alcohol use: Not Currently    Comment: 08/12/2018 nothing in the 2000s   Drug use: Never   Family History  Problem Relation Age of Onset   Cancer Mother        multiple types   Stroke Father        multiple   No Known Allergies    ROS Per HPI.    Objective:     BP (!) 154/76   Pulse (!) 48   Temp 98.1 F (36.7 C) (Oral)   Resp 16   Ht 6' (1.829 m)   Wt 270 lb 3.2 oz (122.6 kg)   SpO2 97%   BMI 36.65 kg/m   BP Readings from Last 3 Encounters:  02/27/24 (!) 154/76  02/26/24 (!) 168/92  11/27/23 (!) 161/70   Wt Readings from Last 3 Encounters:  02/27/24 270 lb 3.2 oz (122.6 kg)  02/26/24 271 lb 11.2 oz (123.2 kg)  11/27/23 271 lb 4.8 oz (123.1 kg)      Physical Exam Constitutional:      General: He is not in acute distress.    Appearance: Normal appearance. He is not ill-appearing.  HENT:     Head: Normocephalic and atraumatic.     Right Ear: External ear normal.     Left Ear: External ear normal.     Nose: Nose normal.   Eyes:     Conjunctiva/sclera: Conjunctivae normal.    Cardiovascular:     Rate and Rhythm: Normal rate and regular rhythm.     Pulses:          Dorsalis pedis pulses are 1+ on the right side and 1+ on the left side.       Posterior tibial pulses are 1+ on the right side and 1+ on the left side.     Heart sounds: Normal heart sounds. No murmur heard.    No friction rub.  Pulmonary:     Effort: Pulmonary effort is normal. No respiratory distress.     Breath sounds: Normal breath sounds. No wheezing, rhonchi or rales.   Musculoskeletal:     Right lower leg: Edema (2+ pitting) present.     Left lower leg: Edema (2+ pitting) present.     Right foot: No deformity or Charcot foot.     Left foot: No deformity or Charcot foot.  Feet:     Right foot:     Protective Sensation: 9 sites tested.  9 sites sensed.     Skin integrity: Dry skin present. No ulcer, skin breakdown, erythema or warmth.     Toenail Condition: Right toenails are normal.     Left foot:     Protective Sensation: 9 sites tested.  9 sites sensed.     Skin integrity: Dry skin present. No ulcer, skin breakdown, erythema or warmth.     Toenail Condition: Left toenails are normal.   Skin:    General: Skin is warm and dry.     Coloration: Skin is not jaundiced or pale.     Findings: Bruising present.     Comments: LE: Shiny skin bilaterally. Apparent hemosiderin deposition.    Neurological:      Mental Status: He is alert.   Psychiatric:        Mood and Affect: Mood normal.        Behavior: Behavior normal.      Results for orders placed or performed in visit on 02/27/24  Medicare Annual Wellness Visits  Result Value Ref Range   HM Annual Wellness Visit See Report (in chart)   Results for orders placed or performed in visit on 02/27/24  CBC with Differential/Platelet  Result Value Ref Range   WBC 7.2 3.4 - 10.8 x10E3/uL   RBC 4.52 4.14 - 5.80 x10E6/uL   Hemoglobin 13.8 13.0 - 17.7 g/dL   Hematocrit 16.1 09.6 - 51.0 %   MCV 96 79 - 97 fL   MCH 30.5 26.6 - 33.0 pg   MCHC 31.9 31.5 - 35.7 g/dL   RDW 04.5 40.9 - 81.1 %   Platelets 152 150 - 450 x10E3/uL   Neutrophils 58 Not Estab. %   Lymphs 30 Not Estab. %   Monocytes 8 Not Estab. %   Eos 3 Not Estab. %   Basos 1 Not Estab. %   Neutrophils Absolute 4.1 1.4 - 7.0 x10E3/uL   Lymphocytes Absolute 2.1 0.7 - 3.1 x10E3/uL   Monocytes Absolute 0.5 0.1 - 0.9 x10E3/uL   EOS (ABSOLUTE) 0.2 0.0 - 0.4 x10E3/uL   Basophils Absolute 0.1 0.0 - 0.2 x10E3/uL   Immature Granulocytes 0 Not Estab. %   Immature Grans (Abs) 0.0 0.0 - 0.1 x10E3/uL  Comprehensive metabolic panel with GFR  Result Value Ref Range   Glucose 102 (H) 70 - 99 mg/dL   BUN 32 (H) 8 - 27 mg/dL   Creatinine, Ser 9.14 0.76 - 1.27 mg/dL   eGFR 63 >78 GN/FAO/1.30   BUN/Creatinine Ratio 26 (H) 10 - 24   Sodium 139 134 - 144 mmol/L   Potassium 4.9 3.5 - 5.2 mmol/L   Chloride 104 96 - 106 mmol/L   CO2 16 (L) 20 - 29 mmol/L   Calcium  10.1 8.6 - 10.2 mg/dL   Total Protein 6.5 6.0 - 8.5 g/dL   Albumin  4.4 3.8 - 4.8 g/dL   Globulin, Total 2.1 1.5 - 4.5 g/dL   Bilirubin Total 0.3 0.0 - 1.2 mg/dL   Alkaline Phosphatase 82 44 - 121 IU/L   AST 26 0 - 40 IU/L   ALT 27 0 - 44 IU/L  Lipid panel  Result Value Ref Range   Cholesterol, Total 116 100 - 199 mg/dL   Triglycerides 97 0 - 149 mg/dL   HDL 40 >86 mg/dL   VLDL Cholesterol Cal 18 5 - 40 mg/dL   LDL Chol Calc  (NIH) 58 0 - 99 mg/dL   Chol/HDL Ratio 2.9 0.0 - 5.0 ratio  Hemoglobin A1c  Result Value Ref Range   Hgb A1c MFr Bld 6.0 (H) 4.8 - 5.6 %   Est. average glucose Bld gHb Est-mCnc 126 mg/dL    Last CBC Lab Results  Component Value Date   WBC 7.2 02/27/2024   HGB 13.8 02/27/2024   HCT 43.2 02/27/2024   MCV 96 02/27/2024   MCH 30.5 02/27/2024   RDW 12.7 02/27/2024   PLT 152 02/27/2024   Last metabolic panel Lab Results  Component Value Date   GLUCOSE 102 (H) 02/27/2024   NA 139 02/27/2024   K 4.9 02/27/2024   CL 104 02/27/2024   CO2 16 (L) 02/27/2024   BUN 32 (H) 02/27/2024   CREATININE 1.22 02/27/2024   EGFR 63 02/27/2024   CALCIUM  10.1 02/27/2024   PROT 6.5 02/27/2024   ALBUMIN  4.4 02/27/2024   LABGLOB 2.1 02/27/2024   BILITOT 0.3 02/27/2024   ALKPHOS 82 02/27/2024   AST 26 02/27/2024   ALT 27 02/27/2024   ANIONGAP 14  01/28/2019   Last lipids Lab Results  Component Value Date   CHOL 116 02/27/2024   HDL 40 02/27/2024   LDLCALC 58 02/27/2024   TRIG 97 02/27/2024   CHOLHDL 2.9 02/27/2024   Last hemoglobin A1c Lab Results  Component Value Date   HGBA1C 6.0 (H) 02/27/2024      The ASCVD Risk score (Arnett DK, et al., 2019) failed to calculate for the following reasons:   The valid total cholesterol range is 130 to 320 mg/dL    Assessment & Plan:   Essential hypertension Assessment & Plan: Chronic, stable. Hypertension is monitored with home blood pressure readings. Recent readings are generally in the 120s, with an elevated reading today likely due to physical activity. They are on amlodipine , which requires a refill. - [facility] [place]  Orders: -     amLODIPine  Besylate; Take 1 tablet (10 mg total) by mouth every other day.  Dispense: 90 tablet; Refill: 3 -     CBC with Differential/Platelet -     Comprehensive metabolic panel with GFR  PAD (peripheral artery disease) (HCC) Assessment & Plan: Peripheral  artery disease with stent placement. Recent vascular follow-up showed a clear stent and good blood flow. Next follow-up is scheduled for one year.  Orders: -     CBC with Differential/Platelet -     Comprehensive metabolic panel with GFR  Encounter for lipid screening for cardiovascular disease -     Lipid panel  Type 2 diabetes mellitus with diabetic peripheral angiopathy without gangrene, without long-term current use of insulin  (HCC) Assessment & Plan: Chronic, stable. Blood glucose levels are variable, with occasional hyperglycemia likely related to dietary intake. They report mild hypoglycemia on occasion but he is not bothered by this. A1c will be checked to assess overall glycemic control. - Check A1c level today.  Orders: -     CBC with Differential/Platelet -     Comprehensive metabolic panel with GFR -     Hemoglobin A1c  Neuropathy Assessment & Plan: Chronic, stable. Reduced sensation in the right foot, likely due to peripheral neuropathy. The right foot is swollen, which may affect sensation testing. Sensation is better in the left foot. - Perform foot exam including monofilament testing to assess sensation.    Return in about 3 months (around 05/29/2024) for Chronic Followup, DM.    Farah Lepak T Rishik Tubby, [name]

## 2024-02-27 NOTE — Patient Instructions (Signed)
 It was nice to see you today!  If you have any problems before your next visit feel free to message me via MyChart (minor issues or questions) or call the office, otherwise you may reach out to schedule an office visit.  Thank you! Pau Banh, [name]

## 2024-02-28 ENCOUNTER — Ambulatory Visit (HOSPITAL_BASED_OUTPATIENT_CLINIC_OR_DEPARTMENT_OTHER): Payer: Self-pay | Admitting: Student

## 2024-02-28 DIAGNOSIS — G629 Polyneuropathy, unspecified: Secondary | ICD-10-CM | POA: Insufficient documentation

## 2024-02-28 LAB — CBC WITH DIFFERENTIAL/PLATELET
Basophils Absolute: 0.1 10*3/uL (ref 0.0–0.2)
Basos: 1 %
EOS (ABSOLUTE): 0.2 10*3/uL (ref 0.0–0.4)
Eos: 3 %
Hematocrit: 43.2 % (ref 37.5–51.0)
Hemoglobin: 13.8 g/dL (ref 13.0–17.7)
Immature Grans (Abs): 0 10*3/uL (ref 0.0–0.1)
Immature Granulocytes: 0 %
Lymphocytes Absolute: 2.1 10*3/uL (ref 0.7–3.1)
Lymphs: 30 %
MCH: 30.5 pg (ref 26.6–33.0)
MCHC: 31.9 g/dL (ref 31.5–35.7)
MCV: 96 fL (ref 79–97)
Monocytes Absolute: 0.5 10*3/uL (ref 0.1–0.9)
Monocytes: 8 %
Neutrophils Absolute: 4.1 10*3/uL (ref 1.4–7.0)
Neutrophils: 58 %
Platelets: 152 10*3/uL (ref 150–450)
RBC: 4.52 x10E6/uL (ref 4.14–5.80)
RDW: 12.7 % (ref 11.6–15.4)
WBC: 7.2 10*3/uL (ref 3.4–10.8)

## 2024-02-28 LAB — HEMOGLOBIN A1C
Est. average glucose Bld gHb Est-mCnc: 126 mg/dL
Hgb A1c MFr Bld: 6 % — ABNORMAL HIGH (ref 4.8–5.6)

## 2024-02-28 LAB — COMPREHENSIVE METABOLIC PANEL WITH GFR
ALT: 27 IU/L (ref 0–44)
AST: 26 IU/L (ref 0–40)
Albumin: 4.4 g/dL (ref 3.8–4.8)
Alkaline Phosphatase: 82 IU/L (ref 44–121)
BUN/Creatinine Ratio: 26 — ABNORMAL HIGH (ref 10–24)
BUN: 32 mg/dL — ABNORMAL HIGH (ref 8–27)
Bilirubin Total: 0.3 mg/dL (ref 0.0–1.2)
CO2: 16 mmol/L — ABNORMAL LOW (ref 20–29)
Calcium: 10.1 mg/dL (ref 8.6–10.2)
Chloride: 104 mmol/L (ref 96–106)
Creatinine, Ser: 1.22 mg/dL (ref 0.76–1.27)
Globulin, Total: 2.1 g/dL (ref 1.5–4.5)
Glucose: 102 mg/dL — ABNORMAL HIGH (ref 70–99)
Potassium: 4.9 mmol/L (ref 3.5–5.2)
Sodium: 139 mmol/L (ref 134–144)
Total Protein: 6.5 g/dL (ref 6.0–8.5)
eGFR: 63 mL/min/{1.73_m2} (ref >59)

## 2024-02-28 LAB — LIPID PANEL
Chol/HDL Ratio: 2.9 ratio (ref 0.0–5.0)
Cholesterol, Total: 116 mg/dL (ref 100–199)
HDL: 40 mg/dL (ref >39)
LDL Chol Calc (NIH): 58 mg/dL (ref 0–99)
Triglycerides: 97 mg/dL (ref 0–149)
VLDL Cholesterol Cal: 18 mg/dL (ref 5–40)

## 2024-02-28 NOTE — Assessment & Plan Note (Signed)
 Chronic, stable. Reduced sensation in the right foot, likely due to peripheral neuropathy. The right foot is swollen, which may affect sensation testing. Sensation is better in the left foot. - Perform foot exam including monofilament testing to assess sensation.

## 2024-02-28 NOTE — Assessment & Plan Note (Addendum)
 Chronic, stable. Blood glucose levels are variable, with occasional hyperglycemia likely related to dietary intake. They report mild hypoglycemia on occasion but he is not bothered by this. A1c will be checked to assess overall glycemic control. - Check A1c level today.

## 2024-02-28 NOTE — Assessment & Plan Note (Addendum)
 Peripheral artery disease with stent placement. Recent vascular follow-up showed a clear stent and good blood flow. Next follow-up is scheduled for one year.

## 2024-02-28 NOTE — Assessment & Plan Note (Signed)
 Chronic, stable. Hypertension is monitored with home blood pressure readings. Recent readings are generally in the 120s, with an elevated reading today likely due to physical activity. They are on amlodipine , which requires a refill. - [facility] [place].

## 2024-03-02 ENCOUNTER — Other Ambulatory Visit (HOSPITAL_BASED_OUTPATIENT_CLINIC_OR_DEPARTMENT_OTHER): Payer: Self-pay | Admitting: Student

## 2024-03-02 NOTE — Telephone Encounter (Unsigned)
 Copied from CRM [id number]. Topic: Clinical - Medication Refill >> Mar 02, 2024  2:53 PM Rachelle R wrote: Medication: atorvastatin  (LIPITOR) 20 MG tablet  Has the patient contacted their pharmacy? Yes, call dr  This is the patient's preferred pharmacy:  [id number] [place], [place] - [address] [facility] [place] [address] Phone: [phone] Fax: [phone]  Is this the correct pharmacy for this prescription? Yes If no, delete pharmacy and type the correct one.   Has the prescription been filled recently? No  Is the patient out of the medication? Yes  Has the patient been seen for an appointment in the last year OR does the patient have an upcoming appointment? Yes  Can we respond through MyChart? No  Agent: Please be advised that Rx refills may take up to 3 business days. We ask that you follow-up with your pharmacy.

## 2024-03-03 MED ORDER — ATORVASTATIN CALCIUM 20 MG PO TABS
20.0000 mg | ORAL_TABLET | Freq: Every day | ORAL | 2 refills | Status: DC
  Filled 2024-05-28 – 2024-06-01 (×2): qty 90, 90d supply, fill #0

## 2024-03-11 ENCOUNTER — Ambulatory Visit (HOSPITAL_BASED_OUTPATIENT_CLINIC_OR_DEPARTMENT_OTHER)

## 2024-03-11 ENCOUNTER — Encounter (HOSPITAL_BASED_OUTPATIENT_CLINIC_OR_DEPARTMENT_OTHER): Payer: Self-pay

## 2024-03-11 VITALS — BP 126/76 | HR 44 | Ht 72.0 in | Wt 270.0 lb

## 2024-03-11 DIAGNOSIS — Z0000 Encounter for general adult medical examination without abnormal findings: Secondary | ICD-10-CM | POA: Diagnosis not present

## 2024-03-11 NOTE — Patient Instructions (Signed)
  Mr. Billig , Thank you for taking time to come for your Medicare Wellness Visit. I appreciate your ongoing commitment to your health goals. Please review the following plan we discussed and let me know if I can assist you in the future.   These are the goals we discussed:  Goals      Patient Stated     Pt reported he would like to move around better. Would like to get back to church.         This is a list of the screening recommended for you and due dates:  Health Maintenance  Topic Date Due   Hepatitis C Screening  Never done   DTaP/Tdap/Td vaccine (1 - Tdap) 02/26/2025*   Colon Cancer Screening  02/26/2025*   COVID-19 Vaccine (3 - 2024-25 season) 02/26/2025*   Flu Shot  04/17/2024   Hemoglobin A1C  08/28/2024   Eye exam for diabetics  09/16/2024   Yearly kidney health urinalysis for diabetes  10/15/2024   Yearly kidney function blood test for diabetes  02/26/2025   Complete foot exam   02/26/2025   Medicare Annual Wellness Visit  03/11/2025   Pneumococcal Vaccine for age over 66  Completed   Zoster (Shingles) Vaccine  Completed   Hepatitis B Vaccine  Aged Out   HPV Vaccine  Aged Out   Meningitis B Vaccine  Aged Out   Screening for Lung Cancer  Discontinued  *Topic was postponed. The date shown is not the original due date.

## 2024-03-11 NOTE — Progress Notes (Signed)
 Subjective:   Brandon Collins is a 73 y.o. male who presents for Medicare Annual/Subsequent preventive examination.  Visit Complete: Virtual I connected with  Brandon Collins on 03/11/24 by a audio enabled telemedicine application and verified that I am speaking with the correct person using two identifiers.  Patient Location: Home  Provider Location: Office/Clinic  I discussed the limitations of evaluation and management by telemedicine. The patient expressed understanding and agreed to proceed.  Vital Signs: Because this visit was a virtual/telehealth visit, some criteria may be missing or patient reported. Any vitals not documented were not able to be obtained and vitals that have been documented are patient reported.  Patient Medicare AWV questionnaire was completed by the patient on 03/11/2024; I have confirmed that all information answered by patient is correct and no changes since this date.  Cardiac Risk Factors include: advanced age (>32men, >10 women);hypertension;male gender;obesity (BMI >30kg/m2);sedentary lifestyle     Objective:    Today's Vitals   03/11/24 1108  BP: 126/76  Pulse: (!) 44  Weight: 270 lb (122.5 kg)  Height: 6' (1.829 m)  PainSc: 0-No pain   Body mass index is 36.62 kg/m.     03/11/2024   11:19 AM 10/08/2022    9:22 AM 05/10/2021   11:17 AM 09/26/2020    7:29 AM 10/26/2019    7:30 AM 06/12/2019    1:05 PM 02/27/2019   12:16 PM  Advanced Directives  Does Patient Have a Medical Advance Directive? Yes No Yes Yes Yes Yes Yes  Type of Advance Directive Living will  Healthcare Power of [place];Living will Healthcare Power of [place];Living will Healthcare Power of [place];Living will Healthcare Power of [place];Living will Healthcare Power of [place];Living will  Does patient want to make changes to medical advance directive? No - Patient declined   No - Patient declined  No - Patient declined No - Patient declined   Copy of Healthcare Power of Attorney  in Chart?    No - copy requested     Would patient like information on creating a medical advance directive?  No - Patient declined          Data saved with a previous flowsheet row definition     Current Medications (verified) Outpatient Encounter Medications as of 03/11/2024  Medication Sig   Accu-Chek Softclix Lancets lancets daily.   amLODipine  (NORVASC ) 10 MG tablet Take 1 tablet (10 mg total) by mouth every other day.   aspirin  EC 81 MG tablet Take 81 mg by mouth daily.   atenolol (TENORMIN) 50 MG tablet TAKE 1 TABLET BY MOUTH EVERY DAY FOR 90 DAYS   atorvastatin  (LIPITOR) 20 MG tablet Take 1 tablet (20 mg total) by mouth at bedtime.   Blood Glucose Monitoring Suppl (ACCU-CHEK GUIDE) w/Device KIT See admin instructions.   chlorthalidone  (HYGROTON ) 25 MG tablet Take 1 tablet (25 mg total) by mouth daily.   cholecalciferol  (VITAMIN D ) 1000 units tablet Take 1 tablet (1,000 Units total) by mouth daily.   clopidogrel  (PLAVIX ) 75 MG tablet TAKE 1 TABLET BY MOUTH EVERY DAY   glimepiride  (AMARYL ) 4 MG tablet Take 1 tablet (4 mg total) by mouth daily with breakfast.   lisinopril  (ZESTRIL ) 40 MG tablet Take 1 tablet (40 mg total) by mouth daily.   loratadine-pseudoephedrine (CLARITIN-D 24-HOUR) 10-240 MG 24 hr tablet Take 1 tablet by mouth daily.   ONETOUCH ULTRA test strip USE TO TEST FASTING SUGAR ONCE DAILY IN THE MORNING   pioglitazone  (ACTOS ) 15 MG  tablet Take 1 tablet (15 mg total) by mouth daily.   vitamin C (ASCORBIC ACID) 250 MG tablet Take 250 mg by mouth daily.   No facility-administered encounter medications on file as of 03/11/2024.    Allergies (verified) Patient has no known allergies.   History: Past Medical History:  Diagnosis Date   Arthritis    all over (08/12/2018)   Complication of anesthesia 07/2018   out of it for 2 weeks.  Low Blood Sugar.   High cholesterol    HOH (hard of hearing)    Hypertension    Peripheral arterial disease (HCC)    Type II  diabetes mellitus (HCC)    Ulcer of ankle, right, with unspecified severity [facility])    Past Surgical History:  Procedure Laterality Date   ABDOMINAL AORTOGRAM W/LOWER EXTREMITY N/A 01/22/2019   Procedure: ABDOMINAL AORTOGRAM W/LOWER EXTREMITY;  Surgeon: Sheree Penne Bruckner, MD;  Location: [facility] INVASIVE CV LAB;  Service: Cardiovascular;  Laterality: N/A;  rt leg   ABDOMINAL AORTOGRAM W/LOWER EXTREMITY N/A 10/26/2019   Procedure: ABDOMINAL AORTOGRAM W/LOWER EXTREMITY;  Surgeon: Sheree Penne Bruckner, MD;  Location: [facility] INVASIVE CV LAB;  Service: Cardiovascular;  Laterality: N/A;  Rt lower extermity    ABDOMINAL AORTOGRAM W/LOWER EXTREMITY Bilateral 09/26/2020   Procedure: ABDOMINAL AORTOGRAM W/LOWER EXTREMITY;  Surgeon: Sheree Penne Bruckner, MD;  Location: [facility] INVASIVE CV LAB;  Service: Cardiovascular;  Laterality: Bilateral;   ABDOMINAL AORTOGRAM W/LOWER EXTREMITY N/A 10/08/2022   Procedure: ABDOMINAL AORTOGRAM W/LOWER EXTREMITY;  Surgeon: Sheree Penne Bruckner, MD;  Location: [facility] INVASIVE CV LAB;  Service: Cardiovascular;  Laterality: N/A;   ANTERIOR CERVICAL DECOMP/DISCECTOMY FUSION  08/12/2018   CERVICAL 3-4, CERVICAL 4-5, CERVICAL 5-6, CERVICAL 6-7 WITH INSTRUMENTATION AND ALLOGRAFT   ANTERIOR CERVICAL DECOMPRESSION/DISCECTOMY FUSION 4 LEVELS N/A 08/12/2018   Procedure: ANTERIOR CERVICAL DECOMPRESSION FUSION CERVICAL 3-4, CERVICAL 4-5, CERVICAL 5-6, CERVICAL 6-7 WITH INSTRUMENTATION AND ALLOGRAFT;  Surgeon: Beuford Anes, MD;  Location: [facility] OR;  Service: Orthopedics;  Laterality: N/A;  ANTERIOR CERVICAL DECOMPRESSION FUSION CERVICAL 3-4, CERVICAL 4-5, CERVICAL 5-6, CERVICAL 6-7 WITH INSTRUMENTATION AND ALLOGRAFT   COLONOSCOPY W/ POLYPECTOMY     FEMORAL-POPLITEAL BYPASS GRAFT Right 01/27/2019   Procedure: right FEMORAL ENDARTERECTOMY, RIGHT  FEMORAL- BELOW THE KNEE POPLITEAL ARTERY BYPASS GRAFT using 6mm propaten ringed gore graft, attempted right femoral to below knee popliteal artery  bypass using right nonreversed great saphenous vein;  Surgeon: Sheree Penne Bruckner, MD;  Location: [facility] OR;  Service: Vascular;  Laterality: Right;   INGUINAL HERNIA REPAIR  1958   ? side   KNEE ARTHROSCOPY Right    PERIPHERAL VASCULAR ATHERECTOMY  10/26/2019   Procedure: PERIPHERAL VASCULAR ATHERECTOMY;  Surgeon: Sheree Penne Bruckner, MD;  Location: [facility] INVASIVE CV LAB;  Service: Cardiovascular;;  LASER   PERIPHERAL VASCULAR Collins ANGIOPLASTY Right 09/26/2020   Procedure: PERIPHERAL VASCULAR Collins ANGIOPLASTY;  Surgeon: Sheree Penne Bruckner, MD;  Location: [facility] INVASIVE CV LAB;  Service: Cardiovascular;  Laterality: Right;  SFA  FEMPOP BYPASS GRAFT   PERIPHERAL VASCULAR INTERVENTION Right 10/08/2022   Procedure: PERIPHERAL VASCULAR INTERVENTION;  Surgeon: Sheree Penne Bruckner, MD;  Location: [facility] INVASIVE CV LAB;  Service: Cardiovascular;  Laterality: Right;  Fem-pop bypass   PILONIDAL CYST EXCISION     Family History  Problem Relation Age of Onset   Cancer Mother        multiple types   Stroke Father        multiple   Social History   Socioeconomic History   Marital status: Married  Spouse name: Not on file   Number of children: 1   Years of education: Not on file   Highest education level: Not on file  Occupational History   Not on file  Tobacco Use   Smoking status: Former    Current packs/day: 1.00    Average packs/day: 1 pack/day for 30.0 years (30.0 ttl pk-yrs)    Types: Cigarettes    Passive exposure: Past   Smokeless tobacco: Never   Tobacco comments:    quit in late 1990's  Vaping Use   Vaping status: Never Used  Substance and Sexual Activity   Alcohol use: Not Currently    Comment: 08/12/2018 nothing in the 2000s   Drug use: Never   Sexual activity: Not Currently  Other Topics Concern   Not on file  Social History Narrative   1 daughter & 2 step children    Social Drivers of Corporate investment banker Strain: Low Risk  (03/11/2024)    Overall Financial Resource Strain (CARDIA)    Difficulty of Paying Living Expenses: Not hard at all  Food Insecurity: No Food Insecurity (03/11/2024)   Hunger Vital Sign    Worried About Running Out of Food in the Last Year: Never true    Ran Out of Food in the Last Year: Never true  Transportation Needs: No Transportation Needs (03/11/2024)   PRAPARE - Administrator, Civil Service (Medical): No    Lack of Transportation (Non-Medical): No  Physical Activity: Inactive (03/11/2024)   Exercise Vital Sign    Days of Exercise per Week: 0 days    Minutes of Exercise per Session: 0 min  Stress: No Stress Concern Present (03/11/2024)   [employer] of Occupational Health - Occupational Stress Questionnaire    Feeling of Stress: Not at all  Social Connections: Moderately Isolated (03/11/2024)   Social Connection and Isolation Panel    Frequency of Communication with Friends and Family: Never    Frequency of Social Gatherings with Friends and Family: Never    Attends Religious Services: Never    Database administrator or Organizations: Yes    Attends Engineer, structural: Never    Marital Status: Married    Tobacco Counseling Counseling given: Yes Tobacco comments: quit in late 1990's   Clinical Intake:  Pre-visit preparation completed: No  Pain : 0-10 Pain Score: 0-No pain     BMI - recorded: 36.64 Nutritional Status: BMI > 30  Obese Nutritional Risks: Unintentional weight gain Diabetes: No CBG done?: Yes (89- LAST APPT 11/27/2023) CBG resulted in Enter/ Edit results?: Yes (89- LAST APPT 11/27/2023) Did pt. bring in CBG monitor from home?: No  How often do you need to have someone help you when you read instructions, pamphlets, or other written materials from your doctor or pharmacy?: 1 - Never  Interpreter Needed?: No  Information entered by :: TJ, CMA   Activities of Daily Living    03/11/2024   11:12 AM  In your present state of health, do you  have any difficulty performing the following activities:  Hearing? 1  Vision? 1  Difficulty concentrating or making decisions? 0  Walking or climbing stairs? 1  Dressing or bathing? 1  Doing errands, shopping? 1  Comment WIFE GOES WITH PT FOR ERRANDS  Preparing Food and eating ? Y  Comment WIFE COOKS, CAN MAKE SIMPLE MEALS  Using the Toilet? N  In the past six months, have you accidently leaked urine? N  Do  you have problems with loss of bowel control? N  Managing your Medications? N  Managing your Finances? N  Housekeeping or managing your Housekeeping? Y  Comment WIFE    Patient Care Team: [name], Jacob T, [name] as PCP - General (Physician Assistant)  Indicate any recent Medical Services you may have received from other than [facility] providers in the past year (date may be approximate).     Assessment:   This is a routine wellness examination for Obed.  Hearing/Vision screen No results found.   Goals Addressed             This Visit's Progress    Patient Stated       Pt reported he would like to move around better. Would like to get back to church.        Depression Screen    03/11/2024   11:21 AM 03/11/2024   11:19 AM 10/16/2023    1:25 PM 10/21/2018   10:17 AM  PHQ 2/9 Scores  PHQ - 2 Score 0 0 0 0  PHQ- 9 Score   1 5    Fall Risk    03/11/2024   11:20 AM 02/27/2024    2:05 PM 10/16/2023    1:26 PM 05/10/2021   11:18 AM 12/02/2018   11:02 AM  Fall Risk   Falls in the past year? 0 0 0 0 1   Number falls in past yr: 0 1 0  1   Injury with Fall? 0 0 0    Risk for fall due to : No Fall Risks No Fall Risks No Fall Risks    Follow up Falls evaluation completed;Education provided;Falls prevention discussed Falls evaluation completed;Education provided;Falls prevention discussed Falls evaluation completed;Education provided;Falls prevention discussed       Data saved with a previous flowsheet row definition     MEDICARE RISK AT HOME: Medicare Risk at  Home Any stairs in or around the home?: Yes If so, are there any without handrails?: Yes Home free of loose throw rugs in walkways, pet beds, electrical cords, etc?: No Adequate lighting in your home to reduce risk of falls?: Yes Life alert?: No Use of a cane, walker or w/c?: Yes Grab bars in the bathroom?: No Shower chair or bench in shower?: Yes Elevated toilet seat or a handicapped toilet?: Yes  TIMED UP AND GO:  Was the test performed?  No    Cognitive Function:        03/11/2024   11:22 AM  6CIT Screen  What Year? 0 points  What month? 0 points  What time? 0 points  Count back from 20 0 points  Months in reverse 0 points  Repeat phrase 4 points  Total Score 4 points    Immunizations Immunization History  Administered Date(s) Administered   PFIZER(Purple Top)SARS-COV-2 Vaccination 11/16/2019, 12/15/2019   Pneumococcal Conjugate-13 08/31/2017   Pneumococcal Polysaccharide-23 08/23/2018   Zoster Recombinant(Shingrix) 05/19/2020, 10/17/2020    TDAP status: Due, Education has been provided regarding the importance of this vaccine. Advised may receive this vaccine at local pharmacy or Health Dept. Aware to provide a copy of the vaccination record if obtained from local pharmacy or Health Dept. Verbalized acceptance and understanding. PT DECLINES.   Flu Vaccine status: Declined, Education has been provided regarding the importance of this vaccine but patient still declined. Advised may receive this vaccine at local pharmacy or Health Dept. Aware to provide a copy of the vaccination record if obtained from local pharmacy or Health  Dept. Verbalized acceptance and understanding.  Pneumococcal vaccine status: Up to date  Covid-19 vaccine status: Declined, Education has been provided regarding the importance of this vaccine but patient still declined. Advised may receive this vaccine at local pharmacy or Health Dept.or vaccine clinic. Aware to provide a copy of the vaccination  record if obtained from local pharmacy or Health Dept. Verbalized acceptance and understanding.  Qualifies for Shingles Vaccine? No   Zostavax completed No   Shingrix Completed?: Yes  Screening Tests Health Maintenance  Topic Date Due   Hepatitis C Screening  Never done   DTaP/Tdap/Td (1 - Tdap) 02/26/2025 (Originally 01/31/1970)   Colonoscopy  02/26/2025 (Originally 02/01/1996)   COVID-19 Vaccine (3 - 2024-25 season) 02/26/2025 (Originally 05/19/2023)   INFLUENZA VACCINE  04/17/2024   HEMOGLOBIN A1C  08/28/2024   OPHTHALMOLOGY EXAM  09/16/2024   Diabetic kidney evaluation - Urine ACR  10/15/2024   Diabetic kidney evaluation - eGFR measurement  02/26/2025   FOOT EXAM  02/26/2025   Medicare Annual Wellness (AWV)  03/11/2025   Pneumococcal Vaccine: 50+ Years  Completed   Zoster Vaccines- Shingrix  Completed   Hepatitis B Vaccines  Aged Out   HPV VACCINES  Aged Out   Meningococcal B Vaccine  Aged Out   Lung Cancer Screening  Discontinued    Health Maintenance  Health Maintenance Due  Topic Date Due   Hepatitis C Screening  Never done    Colorectal cancer screening: No longer required. PT DECLINES.  Lung Cancer Screening: (Low Dose CT Chest recommended if Age 40-80 years, 20 pack-year currently smoking OR have quit w/in 15years.) does not qualify.   Lung Cancer Screening Referral: No referral placed.   Additional Screening:  Hepatitis C Screening: does qualify; Completed will discuss at next appointment.   Vision Screening: Recommended annual ophthalmology exams for early detection of glaucoma and other disorders of the eye. Is the patient up to date with their annual eye exam?  No  Who is the provider or what is the name of the office in which the patient attends annual eye exams? Is looking for a provider. If pt is not established with a provider, would they like to be referred to a provider to establish care? No . Pt said they will call their insurance to find a  provider.  Dental Screening: Recommended annual dental exams for proper oral hygiene  Diabetic Foot Exam: Diabetic Foot Exam: Completed 02/27/2024.  Community Resource Referral / Chronic Care Management: CRR required this visit?  No   CCM required this visit?  No     Plan:     I have personally reviewed and noted the following in the patient's chart:   Medical and social history Use of alcohol, tobacco or illicit drugs  Current medications and supplements including opioid prescriptions. Patient is not currently taking opioid prescriptions. Functional ability and status Nutritional status Physical activity Advanced directives List of other physicians Hospitalizations, surgeries, and ER visits in previous 12 months Vitals Screenings to include cognitive, depression, and falls Referrals and appointments  In addition, I have reviewed and discussed with patient certain preventive protocols, quality metrics, and best practice recommendations. A written personalized care plan for preventive services as well as general preventive health recommendations were provided to patient.     Carlie Agent, CMA   03/11/2024   After Visit Summary: (Declined) Due to this being a telephonic visit, with patients personalized plan was offered to patient but patient Declined AVS at this time  Nurse Notes: N/A.

## 2024-05-13 ENCOUNTER — Ambulatory Visit (INDEPENDENT_AMBULATORY_CARE_PROVIDER_SITE_OTHER): Admitting: Podiatry

## 2024-05-13 DIAGNOSIS — M79674 Pain in right toe(s): Secondary | ICD-10-CM

## 2024-05-13 DIAGNOSIS — B351 Tinea unguium: Secondary | ICD-10-CM | POA: Diagnosis not present

## 2024-05-13 DIAGNOSIS — E1151 Type 2 diabetes mellitus with diabetic peripheral angiopathy without gangrene: Secondary | ICD-10-CM

## 2024-05-13 DIAGNOSIS — M79675 Pain in left toe(s): Secondary | ICD-10-CM | POA: Diagnosis not present

## 2024-05-13 DIAGNOSIS — L84 Corns and callosities: Secondary | ICD-10-CM

## 2024-05-13 NOTE — Progress Notes (Unsigned)
    Subjective:  Patient ID: Brandon Collins, male    DOB: 09/07/1951,  MRN: [id number]  Brandon Collins presents to clinic today for:  Chief Complaint  Patient presents with   [name]    [name] with callous but they do not bother him A1c 6 in June  Plavix    Patient notes nails are thick and elongated, causing pain in shoe gear when ambulating.  He has painful calluses bilateral submet 5  PCP is [name], [name], [name].  Last seen 02/27/2024  Past Medical History:  Diagnosis Date   Arthritis    all over (08/12/2018)   Complication of anesthesia 07/2018   out of it for 2 weeks.  Low Blood Sugar.   High cholesterol    HOH (hard of hearing)    Hypertension    Peripheral arterial disease (HCC)    Type II diabetes mellitus (HCC)    Ulcer of ankle, right, with unspecified severity (HCC)    No Known Allergies  Objective:  Brandon Collins is a pleasant 73 y.o. male in NAD. AAO x 3.  Vascular Examination: Patient has palpable DP pulse, absent PT pulse bilateral.  Delayed capillary refill bilateral toes.  Sparse digital hair bilateral.  Proximal to distal cooling WNL bilateral.    Dermatological Examination: Interspaces are clear with no open lesions noted bilateral.  Skin is shiny and atrophic bilateral.  Nails are 3-80mm thick, with yellowish/brown discoloration, subungual debris and distal onycholysis x10.  There is pain with compression of nails x10.  There are hyperkeratotic lesions noted bilateral submet 5.     Latest Ref Rng & Units 02/27/2024    2:53 PM 10/16/2023    2:20 PM  Hemoglobin A1C  Hemoglobin-A1c 4.8 - 5.6 % 6.0  6.5    Patient qualifies for at-risk foot care because of diabetes with mild PVD.  Assessment/Plan: 1. Pain due to onychomycosis of toenails of both feet   2. Callus   3. Type II diabetes mellitus with peripheral circulatory disorder (HCC)     Mycotic nails x10 were sharply debrided with sterile nail nippers and power debriding burr to decrease bulk and  length.  Hyperkeratotic lesions x 2, bilateral submet 5, were shaved with #312 blade.  Return in about 3 months (around 08/13/2024) for [facility].   Brandon Collins, DPM, FACFAS [facility]     [address] [place], [place] [address]                Office [phone]  Fax [phone]

## 2024-05-14 ENCOUNTER — Other Ambulatory Visit (HOSPITAL_BASED_OUTPATIENT_CLINIC_OR_DEPARTMENT_OTHER): Payer: Self-pay | Admitting: Student

## 2024-05-27 ENCOUNTER — Other Ambulatory Visit (HOSPITAL_BASED_OUTPATIENT_CLINIC_OR_DEPARTMENT_OTHER): Payer: Self-pay

## 2024-05-27 ENCOUNTER — Ambulatory Visit (HOSPITAL_BASED_OUTPATIENT_CLINIC_OR_DEPARTMENT_OTHER): Admitting: Student

## 2024-05-27 ENCOUNTER — Encounter (HOSPITAL_BASED_OUTPATIENT_CLINIC_OR_DEPARTMENT_OTHER): Payer: Self-pay | Admitting: Student

## 2024-05-27 VITALS — BP 152/75 | HR 50 | Temp 98.0°F | Resp 16 | Ht 72.0 in | Wt 270.1 lb

## 2024-05-27 DIAGNOSIS — I10 Essential (primary) hypertension: Secondary | ICD-10-CM | POA: Diagnosis not present

## 2024-05-27 DIAGNOSIS — E1151 Type 2 diabetes mellitus with diabetic peripheral angiopathy without gangrene: Secondary | ICD-10-CM | POA: Diagnosis not present

## 2024-05-27 DIAGNOSIS — Z13220 Encounter for screening for lipoid disorders: Secondary | ICD-10-CM

## 2024-05-27 DIAGNOSIS — Z136 Encounter for screening for cardiovascular disorders: Secondary | ICD-10-CM

## 2024-05-27 DIAGNOSIS — D692 Other nonthrombocytopenic purpura: Secondary | ICD-10-CM | POA: Diagnosis not present

## 2024-05-27 NOTE — Patient Instructions (Signed)
 It was nice to see you today!  If you have any problems before your next visit feel free to message me via MyChart (minor issues or questions) or call the office, otherwise you may reach out to schedule an office visit.  Thank you! Pau Banh, [name]

## 2024-05-27 NOTE — Progress Notes (Signed)
 Established Patient Office Visit  Subjective   Patient ID: Brandon Collins, male    DOB: 04/20/1951  Age: 73 y.o. MRN: [id number]  Chief Complaint  Patient presents with   Medical Management of Chronic Issues    follow up     HPI  Discussed the use of AI scribe software for clinical note transcription with the patient, who gave verbal consent to proceed.  History of Present Illness   Brandon Collins is a 73 year old male with hypertension and diabetes who presents for an annual visit.  He has been experiencing elevated blood pressure readings, reaching up to the 170s, which tend to decrease after resting, often dropping to the 150s and then to the 130s after a half-hour. He monitors his blood pressure at home and observes similar patterns. He is currently taking blood pressure medication without side effects.  He is managing diabetes. His cholesterol levels have been stable and well-controlled.  Regarding physical activity, he mentions limited exercise, primarily walking to check the mail and moving around the house. He finds it challenging to exercise more due to the heat and uses a walker for mobility.  He takes aspirin  and Plavix  daily. He has a history of knee issues, having undergone surgery in the past to address clicking and swelling, but reports that the knee has never fully recovered.  He mentions a past skin issue on his leg that was treated successfully, but he has not followed up with a dermatologist recently. He describes a persistent pigmentation spot on his face resulting from a past injury, which occasionally reappears.      Patient Active Problem List   Diagnosis Date Noted   Neuropathy 02/28/2024   Class 2 severe obesity due to excess calories with serious comorbidity and body mass index (BMI) of 36.0 to 36.9 in adult [facility] 10/16/2023   Morbid obesity (HCC) 10/16/2023   Flexion contracture of knee, right 08/18/2019   PAD (peripheral artery disease) (HCC) 01/27/2019    Ankle ulcer, right, limited to breakdown of skin (HCC) 10/21/2018   Primary osteoarthritis of right knee 10/21/2018   Cervical myelopathy (HCC) 08/19/2018   Cervical cord compression with myelopathy (HCC)    T2DM (type 2 diabetes mellitus) (HCC)    Essential hypertension    Dyslipidemia    Drug induced constipation    Myelopathy (HCC) 08/12/2018   Degeneration of lumbar intervertebral disc 08/05/2018   Low back pain 08/05/2018   Neck pain 08/05/2018   Past Medical History:  Diagnosis Date   Arthritis    all over (08/12/2018)   Complication of anesthesia 07/2018   out of it for 2 weeks.  Low Blood Sugar.   High cholesterol    HOH (hard of hearing)    Hypertension    Peripheral arterial disease (HCC)    Type II diabetes mellitus (HCC)    Ulcer of ankle, right, with unspecified severity (HCC)    Social History   Tobacco Use   Smoking status: Former    Current packs/day: 1.00    Average packs/day: 1 pack/day for 30.0 years (30.0 ttl pk-yrs)    Types: Cigarettes    Passive exposure: Past   Smokeless tobacco: Never   Tobacco comments:    quit in late 1990's  Vaping Use   Vaping status: Never Used  Substance Use Topics   Alcohol use: Not Currently    Comment: 08/12/2018 nothing in the 2000s   Drug use: Never   No Known Allergies  ROS Per HPI.    Objective:     BP (!) 152/75   Pulse (!) 50   Temp 98 F (36.7 C) (Oral)   Resp 16   Ht 6' (1.829 m)   Wt 270 lb 1.6 oz (122.5 kg)   SpO2 97%   BMI 36.63 kg/m  BP Readings from Last 3 Encounters:  05/27/24 (!) 152/75  03/11/24 126/76  02/27/24 (!) 154/76   Wt Readings from Last 3 Encounters:  05/27/24 270 lb 1.6 oz (122.5 kg)  03/11/24 270 lb (122.5 kg)  02/27/24 270 lb 3.2 oz (122.6 kg)      Physical Exam Constitutional:      General: He is not in acute distress.    Appearance: Normal appearance. He is not ill-appearing or diaphoretic.  HENT:     Head: Normocephalic and atraumatic.      Right Ear: Tympanic membrane, ear canal and external ear normal.     Left Ear: Tympanic membrane, ear canal and external ear normal.     Nose: Nose normal.     Mouth/Throat:     Mouth: Mucous membranes are moist.     Pharynx: Oropharynx is clear.  Eyes:     General: No scleral icterus.       Right eye: No discharge.        Left eye: No discharge.     Extraocular Movements: Extraocular movements intact.     Conjunctiva/sclera: Conjunctivae normal.     Pupils: Pupils are equal, round, and reactive to light.  Neck:     Thyroid: No thyroid mass, thyromegaly or thyroid tenderness.     Vascular: No carotid bruit.  Cardiovascular:     Rate and Rhythm: Normal rate and regular rhythm.     Pulses: Normal pulses.     Heart sounds: Normal heart sounds. No murmur heard.    No friction rub. No gallop.  Pulmonary:     Effort: Pulmonary effort is normal.     Breath sounds: Normal breath sounds. No wheezing, rhonchi or rales.  Chest:     Chest wall: No tenderness.  Abdominal:     General: Bowel sounds are normal. There is no distension.     Palpations: Abdomen is soft.     Tenderness: There is no abdominal tenderness. There is no guarding.  Musculoskeletal:        General: No swelling, deformity or signs of injury.     Cervical back: Neck supple.     Right lower leg: Edema present.     Left lower leg: Edema present.     Comments: Baseline pitting edema  Lymphadenopathy:     Cervical: No cervical adenopathy.     Right cervical: No superficial or posterior cervical adenopathy.    Left cervical: No superficial cervical adenopathy.  Skin:    Coloration: Skin is not jaundiced.     Findings: No rash.     Comments: Senile purpura noted.  Neurological:     General: No focal deficit present.     Mental Status: He is alert and oriented to person, place, and time.     Motor: No weakness.     Comments: Weakness at left ankle dorsiflexion, all else normal  Psychiatric:        Behavior: Behavior  normal.      No results found for any visits on 05/27/24.  Last CBC Lab Results  Component Value Date   WBC 7.2 02/27/2024   HGB 13.8 02/27/2024   HCT  43.2 02/27/2024   MCV 96 02/27/2024   MCH 30.5 02/27/2024   RDW 12.7 02/27/2024   PLT 152 02/27/2024   Last metabolic panel Lab Results  Component Value Date   GLUCOSE 102 (H) 02/27/2024   NA 139 02/27/2024   K 4.9 02/27/2024   CL 104 02/27/2024   CO2 16 (L) 02/27/2024   BUN 32 (H) 02/27/2024   CREATININE 1.22 02/27/2024   EGFR 63 02/27/2024   CALCIUM  10.1 02/27/2024   PROT 6.5 02/27/2024   ALBUMIN  4.4 02/27/2024   LABGLOB 2.1 02/27/2024   BILITOT 0.3 02/27/2024   ALKPHOS 82 02/27/2024   AST 26 02/27/2024   ALT 27 02/27/2024   ANIONGAP 14 01/28/2019   Last lipids Lab Results  Component Value Date   CHOL 116 02/27/2024   HDL 40 02/27/2024   LDLCALC 58 02/27/2024   TRIG 97 02/27/2024   CHOLHDL 2.9 02/27/2024   Last hemoglobin A1c Lab Results  Component Value Date   HGBA1C 6.0 (H) 02/27/2024      The ASCVD Risk score (Arnett DK, et al., 2019) failed to calculate for the following reasons:   The valid total cholesterol range is 130 to 320 mg/dL    Assessment & Plan:   Assessment and Plan    Adult Wellness Visit Annual wellness visit conducted. No major life changes reported. Physical exam performed, including diabetic foot exam. Discussed diet and exercise. - Perform full physical examination - Encourage gradual increase in physical activity   Hypertension Chronic, likely white coat due to normal BP at nurse visit recently. Blood pressure readings have been elevated at times, but typically decrease with rest. Current reading is 144/76 mmHg. No side effects from current antihypertensive medications. - Recheck blood pressure before leaving the clinic - Continue current antihypertensive medications - Monitor blood pressure at home  Type 2 Diabetes Mellitus Chronic, stable. Diabetes management ongoing.  Labs to be done next week due to insurance requirements. - Order A1c test next week - Monitor blood glucose levels - Continue current regimen.  Morbid obesity/Obesity, class 2 BMI 36.63 plus t2dm and hypertension. Discussed the importance of diet and exercise in managing weight. Encouraged to increase physical activity gradually. - Encourage gradual increase in physical activity - Discuss diet and exercise  Atherosclerotic cardiovascular disease on antiplatelet therapy Continues on aspirin  and Plavix  for antiplatelet therapy. Discussed the potential for bruising due to antiplatelet therapy. - Continue aspirin  and Plavix  therapy  Senile Purpura On physical exam, 2/2 aspirin  and plavix  therapy. No contraindications to therapy. Continue current regimen.    Return in about 3 months (around 08/26/2024) for HTN, DM.    Laparis Durrett T Christen Bedoya, [name]

## 2024-05-28 ENCOUNTER — Other Ambulatory Visit (HOSPITAL_BASED_OUTPATIENT_CLINIC_OR_DEPARTMENT_OTHER): Payer: Self-pay

## 2024-05-28 MED FILL — Atenolol Tab 50 MG: ORAL | 90 days supply | Qty: 90 | Fill #0 | Status: CN

## 2024-05-28 MED FILL — Clopidogrel Bisulfate Tab 75 MG (Base Equiv): ORAL | 90 days supply | Qty: 90 | Fill #0 | Status: AC

## 2024-06-01 ENCOUNTER — Other Ambulatory Visit (HOSPITAL_BASED_OUTPATIENT_CLINIC_OR_DEPARTMENT_OTHER): Payer: Self-pay

## 2024-06-05 ENCOUNTER — Other Ambulatory Visit (HOSPITAL_BASED_OUTPATIENT_CLINIC_OR_DEPARTMENT_OTHER)

## 2024-06-05 ENCOUNTER — Other Ambulatory Visit (HOSPITAL_BASED_OUTPATIENT_CLINIC_OR_DEPARTMENT_OTHER): Payer: Self-pay

## 2024-06-05 DIAGNOSIS — E1151 Type 2 diabetes mellitus with diabetic peripheral angiopathy without gangrene: Secondary | ICD-10-CM

## 2024-06-05 DIAGNOSIS — Z13220 Encounter for screening for lipoid disorders: Secondary | ICD-10-CM

## 2024-06-05 DIAGNOSIS — I10 Essential (primary) hypertension: Secondary | ICD-10-CM

## 2024-06-07 LAB — HEMOGLOBIN A1C
Est. average glucose Bld gHb Est-mCnc: 120 mg/dL
Hgb A1c MFr Bld: 5.8 % — ABNORMAL HIGH (ref 4.8–5.6)

## 2024-06-07 LAB — CBC WITH DIFFERENTIAL/PLATELET
Basophils Absolute: 0.1 x10E3/uL (ref 0.0–0.2)
Basos: 1 %
EOS (ABSOLUTE): 0.2 x10E3/uL (ref 0.0–0.4)
Eos: 3 %
Hematocrit: 44.5 % (ref 37.5–51.0)
Hemoglobin: 14.6 g/dL (ref 13.0–17.7)
Immature Grans (Abs): 0 x10E3/uL (ref 0.0–0.1)
Immature Granulocytes: 0 %
Lymphocytes Absolute: 2.2 x10E3/uL (ref 0.7–3.1)
Lymphs: 29 %
MCH: 31 pg (ref 26.6–33.0)
MCHC: 32.8 g/dL (ref 31.5–35.7)
MCV: 95 fL (ref 79–97)
Monocytes Absolute: 0.6 x10E3/uL (ref 0.1–0.9)
Monocytes: 8 %
Neutrophils Absolute: 4.5 x10E3/uL (ref 1.4–7.0)
Neutrophils: 59 %
Platelets: 164 x10E3/uL (ref 150–450)
RBC: 4.71 x10E6/uL (ref 4.14–5.80)
RDW: 12.4 % (ref 11.6–15.4)
WBC: 7.7 x10E3/uL (ref 3.4–10.8)

## 2024-06-07 LAB — COMPREHENSIVE METABOLIC PANEL WITH GFR
ALT: 26 IU/L (ref 0–44)
AST: 25 IU/L (ref 0–40)
Albumin: 4.3 g/dL (ref 3.8–4.8)
Alkaline Phosphatase: 78 IU/L (ref 47–123)
BUN/Creatinine Ratio: 26 — ABNORMAL HIGH (ref 10–24)
BUN: 30 mg/dL — ABNORMAL HIGH (ref 8–27)
Bilirubin Total: 0.4 mg/dL (ref 0.0–1.2)
CO2: 19 mmol/L — ABNORMAL LOW (ref 20–29)
Calcium: 9.4 mg/dL (ref 8.6–10.2)
Chloride: 102 mmol/L (ref 96–106)
Creatinine, Ser: 1.15 mg/dL (ref 0.76–1.27)
Globulin, Total: 2 g/dL (ref 1.5–4.5)
Glucose: 81 mg/dL (ref 70–99)
Potassium: 4.1 mmol/L (ref 3.5–5.2)
Sodium: 139 mmol/L (ref 134–144)
Total Protein: 6.3 g/dL (ref 6.0–8.5)
eGFR: 67 mL/min/1.73 (ref >59)

## 2024-06-07 LAB — LIPID PANEL
Chol/HDL Ratio: 3.2 ratio (ref 0.0–5.0)
Cholesterol, Total: 128 mg/dL (ref 100–199)
HDL: 40 mg/dL (ref >39)
LDL Chol Calc (NIH): 68 mg/dL (ref 0–99)
Triglycerides: 110 mg/dL (ref 0–149)
VLDL Cholesterol Cal: 20 mg/dL (ref 5–40)

## 2024-06-08 ENCOUNTER — Ambulatory Visit (HOSPITAL_BASED_OUTPATIENT_CLINIC_OR_DEPARTMENT_OTHER): Payer: Self-pay | Admitting: Student

## 2024-06-08 ENCOUNTER — Other Ambulatory Visit (HOSPITAL_BASED_OUTPATIENT_CLINIC_OR_DEPARTMENT_OTHER): Payer: Self-pay

## 2024-06-18 ENCOUNTER — Other Ambulatory Visit (HOSPITAL_BASED_OUTPATIENT_CLINIC_OR_DEPARTMENT_OTHER): Payer: Self-pay

## 2024-07-07 ENCOUNTER — Other Ambulatory Visit (HOSPITAL_BASED_OUTPATIENT_CLINIC_OR_DEPARTMENT_OTHER): Payer: Self-pay

## 2024-08-11 ENCOUNTER — Other Ambulatory Visit (HOSPITAL_BASED_OUTPATIENT_CLINIC_OR_DEPARTMENT_OTHER): Payer: Self-pay | Admitting: Student

## 2024-08-11 DIAGNOSIS — I10 Essential (primary) hypertension: Secondary | ICD-10-CM

## 2024-08-19 ENCOUNTER — Ambulatory Visit: Admitting: Podiatry

## 2024-08-26 ENCOUNTER — Other Ambulatory Visit (HOSPITAL_BASED_OUTPATIENT_CLINIC_OR_DEPARTMENT_OTHER): Payer: Self-pay | Admitting: Student

## 2024-08-26 ENCOUNTER — Encounter (HOSPITAL_BASED_OUTPATIENT_CLINIC_OR_DEPARTMENT_OTHER): Payer: Self-pay | Admitting: Student

## 2024-08-26 ENCOUNTER — Other Ambulatory Visit (HOSPITAL_BASED_OUTPATIENT_CLINIC_OR_DEPARTMENT_OTHER): Payer: Self-pay

## 2024-08-26 ENCOUNTER — Ambulatory Visit (INDEPENDENT_AMBULATORY_CARE_PROVIDER_SITE_OTHER): Admitting: Student

## 2024-08-26 VITALS — BP 117/74 | HR 92 | Temp 97.8°F | Resp 16 | Ht 72.0 in | Wt 254.3 lb

## 2024-08-26 DIAGNOSIS — E1151 Type 2 diabetes mellitus with diabetic peripheral angiopathy without gangrene: Secondary | ICD-10-CM | POA: Diagnosis not present

## 2024-08-26 DIAGNOSIS — I10 Essential (primary) hypertension: Secondary | ICD-10-CM

## 2024-08-26 MED ORDER — CHLORTHALIDONE 25 MG PO TABS
25.0000 mg | ORAL_TABLET | Freq: Every day | ORAL | 2 refills | Status: AC
  Filled 2024-08-26: qty 90, 90d supply, fill #0

## 2024-08-26 MED FILL — Atenolol Tab 50 MG: ORAL | 90 days supply | Qty: 90 | Fill #0 | Status: AC

## 2024-08-26 NOTE — Progress Notes (Signed)
 Established Patient Office Visit  Subjective   Patient ID: Brandon Collins, male    DOB: May 29, 1951  Age: 73 y.o. MRN: [id number]  Chief Complaint  Patient presents with   Medical Management of Chronic Issues    Follow up. BP log and sugar readings taken from 07/24/2024-08/19/2024. Pt only has 2 sugar readings : 90 & 95. He said it was fasting.   URI    Been sick for 2 weeks. Does not feel good. No energy.     HPI  Discussed the use of AI scribe software for clinical note transcription with the patient, who gave verbal consent to proceed.  History of Present Illness   Brandon Collins is a 73 year old male with diabetes who presents for a follow-up visit.  He has been managing diabetes for forty years with stable blood sugar readings, although he has only checked them twice recently. His last A1c in September was 5.8. He is currently taking pioglitazone  15 mg and glimepiride  4 mg for diabetes management.  He is managing hypertension with chlorthalidone  25 mg, lisinopril  40 mg, amlodipine  10 mg, and atenolol  50 mg. Blood pressure has been fluctuating but is generally well-controlled. He requires a refill for chlorthalidone .  He has been experiencing respiratory symptoms, including cough, congestion, and mild shortness of breath, which are improving. He describes expectoration of yellow sputum and feels better as it clears. He reports some shortness of breath, which is not severe, and confirms improvement in his respiratory symptoms.  He has lost 16 pounds recently, attributed to a loss of appetite while being 'under the weather'. He tries to eat small amounts, such as 'packing nabs or half of a Pop-Tart', to ensure he takes his morning medication.  He mentions needing a disability parking placard due to difficulty walking more than 200 feet without resting. No muscle aches or joint pains related to his cholesterol medication.      Patient Active Problem List   Diagnosis Date Noted    Neuropathy 02/28/2024   Class 2 severe obesity due to excess calories with serious comorbidity and body mass index (BMI) of 36.0 to 36.9 in adult 10/16/2023   Morbid obesity (HCC) 10/16/2023   Flexion contracture of knee, right 08/18/2019   PAD (peripheral artery disease) 01/27/2019   Ankle ulcer, right, limited to breakdown of skin (HCC) 10/21/2018   Primary osteoarthritis of right knee 10/21/2018   Cervical myelopathy (HCC) 08/19/2018   Cervical cord compression with myelopathy (HCC)    T2DM (type 2 diabetes mellitus) (HCC)    Essential hypertension    Dyslipidemia    Drug induced constipation    Myelopathy (HCC) 08/12/2018   Degeneration of lumbar intervertebral disc 08/05/2018   Low back pain 08/05/2018   Neck pain 08/05/2018   Past Medical History:  Diagnosis Date   Arthritis    all over (08/12/2018)   Complication of anesthesia 07/2018   out of it for 2 weeks.  Low Blood Sugar.   High cholesterol    HOH (hard of hearing)    Hypertension    Peripheral arterial disease    Type II diabetes mellitus (HCC)    Ulcer of ankle, right, with unspecified severity (HCC)    Social History   Tobacco Use   Smoking status: Former    Current packs/day: 1.00    Average packs/day: 1 pack/day for 30.0 years (30.0 ttl pk-yrs)    Types: Cigarettes    Passive exposure: Past   Smokeless tobacco: Never  Tobacco comments:    quit in late 1990's  Vaping Use   Vaping status: Never Used  Substance Use Topics   Alcohol use: Not Currently    Comment: 08/12/2018 nothing in the 2000s   Drug use: Never   No Known Allergies    ROS Per HPI.    Objective:     BP 117/74   Pulse 92   Temp 97.8 F (36.6 C) (Oral)   Resp 16   Ht 6' (1.829 m)   Wt 254 lb 4.8 oz (115.3 kg)   SpO2 95%   BMI 34.49 kg/m  BP Readings from Last 3 Encounters:  08/26/24 117/74  05/27/24 (!) 152/75  03/11/24 126/76   Wt Readings from Last 3 Encounters:  08/26/24 254 lb 4.8 oz (115.3 kg)   05/27/24 270 lb 1.6 oz (122.5 kg)  03/11/24 270 lb (122.5 kg)   SpO2 Readings from Last 3 Encounters:  08/26/24 95%  05/27/24 97%  02/27/24 97%      Physical Exam Constitutional:      General: He is not in acute distress.    Appearance: Normal appearance. He is not ill-appearing.  HENT:     Head: Normocephalic and atraumatic.     Right Ear: Ear canal and external ear normal.     Left Ear: Tympanic membrane, ear canal and external ear normal.     Ears:     Comments: R TM with mild effusion based on air fluid levels    Nose: Nose normal.  Eyes:     Conjunctiva/sclera: Conjunctivae normal.  Cardiovascular:     Rate and Rhythm: Normal rate and regular rhythm.     Pulses: Normal pulses.     Heart sounds: Normal heart sounds. No murmur heard.    No friction rub.  Pulmonary:     Effort: Pulmonary effort is normal. No respiratory distress.     Breath sounds: Normal breath sounds. No wheezing, rhonchi or rales.  Skin:    General: Skin is warm and dry.     Coloration: Skin is not jaundiced or pale.  Neurological:     Mental Status: He is alert.  Psychiatric:        Mood and Affect: Mood normal.        Behavior: Behavior normal.      No results found for any visits on 08/26/24.  Last CBC Lab Results  Component Value Date   WBC 7.7 06/05/2024   HGB 14.6 06/05/2024   HCT 44.5 06/05/2024   MCV 95 06/05/2024   MCH 31.0 06/05/2024   RDW 12.4 06/05/2024   PLT 164 06/05/2024   Last metabolic panel Lab Results  Component Value Date   GLUCOSE 81 06/05/2024   NA 139 06/05/2024   K 4.1 06/05/2024   CL 102 06/05/2024   CO2 19 (L) 06/05/2024   BUN 30 (H) 06/05/2024   CREATININE 1.15 06/05/2024   EGFR 67 06/05/2024   CALCIUM  9.4 06/05/2024   PROT 6.3 06/05/2024   ALBUMIN  4.3 06/05/2024   LABGLOB 2.0 06/05/2024   BILITOT 0.4 06/05/2024   ALKPHOS 78 06/05/2024   AST 25 06/05/2024   ALT 26 06/05/2024   ANIONGAP 14 01/28/2019   Last lipids Lab Results  Component  Value Date   CHOL 128 06/05/2024   HDL 40 06/05/2024   LDLCALC 68 06/05/2024   TRIG 110 06/05/2024   CHOLHDL 3.2 06/05/2024   Last hemoglobin A1c Lab Results  Component Value Date   HGBA1C 5.8 (H)  06/05/2024      The ASCVD Risk score (Arnett DK, et al., 2019) failed to calculate for the following reasons:   The valid total cholesterol range is 130 to 320 mg/dL    Assessment & Plan:   Assessment and Plan    Type 2 diabetes mellitus with diabetic peripheral angiopathy Chronic, stable. Long-standing type 2 diabetes mellitus, well-controlled with an A1c of 5.8% as of September. No current concerns from a diabetic standpoint. Current medications include pioglitazone  15 mg and glimepiride  4 mg. - Continue pioglitazone  15 mg daily - Continue glimepiride  4 mg daily  Essential hypertension Chronic, stable. Blood pressure is well-controlled with current medication regimen. Current medications include chlorthalidone  25 mg, lisinopril  40 mg, amlodipine  10 mg, and atenolol  50 mg. - Continue chlorthalidone  25 mg daily - Continue lisinopril  40 mg daily - Continue amlodipine  10 mg daily - Continue atenolol  50 mg daily - Refilled chlorthalidone  prescription  Acute upper respiratory infection with eustachian tube dysfunction Recent upper respiratory infection with symptoms of cough, congestion, and shortness of breath, which are improving. Fluid present in the left ear, likely due to eustachian tube dysfunction secondary to recent illness. - Monitor ear symptoms for resolution      Return in about 6 months (around 02/24/2025) for Chronic Followup, DM.    [name] Genasis Zingale, [name]

## 2024-08-26 NOTE — Patient Instructions (Addendum)
 It was nice to see you today!  - Please let me know if your sickness worsens at all  If you have any problems before your next visit feel free to message me via MyChart (minor issues or questions) or call the office, otherwise you may reach out to schedule an office visit.  Thank you! Samarah Hogle, [name]

## 2024-08-31 ENCOUNTER — Other Ambulatory Visit (HOSPITAL_BASED_OUTPATIENT_CLINIC_OR_DEPARTMENT_OTHER): Payer: Self-pay | Admitting: Student

## 2024-08-31 ENCOUNTER — Other Ambulatory Visit (HOSPITAL_BASED_OUTPATIENT_CLINIC_OR_DEPARTMENT_OTHER): Payer: Self-pay

## 2024-08-31 DIAGNOSIS — E785 Hyperlipidemia, unspecified: Secondary | ICD-10-CM

## 2024-08-31 MED ORDER — ATORVASTATIN CALCIUM 20 MG PO TABS
20.0000 mg | ORAL_TABLET | Freq: Every day | ORAL | 2 refills | Status: AC
  Filled 2024-08-31: qty 90, 90d supply, fill #0

## 2024-08-31 MED FILL — Clopidogrel Bisulfate Tab 75 MG (Base Equiv): ORAL | 90 days supply | Qty: 90 | Fill #1 | Status: AC

## 2024-08-31 NOTE — Telephone Encounter (Signed)
 Per [facility]: atorvastatin ? i think [facility] may have messed up their transfer. looking at it, it says initially 90 with 2 refills. [facility] but only said 90 tabs remaining when they transferred to us . (guessing they had rx filled when they transferred instead of canceling the fill THEN transferring). I can just approve for 90 tabs if that works for you!   Rx refilled.

## 2024-09-09 ENCOUNTER — Ambulatory Visit: Admitting: Podiatry

## 2024-09-09 ENCOUNTER — Other Ambulatory Visit (HOSPITAL_BASED_OUTPATIENT_CLINIC_OR_DEPARTMENT_OTHER): Payer: Self-pay

## 2024-09-09 DIAGNOSIS — L97521 Non-pressure chronic ulcer of other part of left foot limited to breakdown of skin: Secondary | ICD-10-CM

## 2024-09-09 DIAGNOSIS — M79674 Pain in right toe(s): Secondary | ICD-10-CM | POA: Diagnosis not present

## 2024-09-09 DIAGNOSIS — L97511 Non-pressure chronic ulcer of other part of right foot limited to breakdown of skin: Secondary | ICD-10-CM

## 2024-09-09 DIAGNOSIS — B351 Tinea unguium: Secondary | ICD-10-CM

## 2024-09-09 DIAGNOSIS — E11621 Type 2 diabetes mellitus with foot ulcer: Secondary | ICD-10-CM

## 2024-09-09 DIAGNOSIS — E1151 Type 2 diabetes mellitus with diabetic peripheral angiopathy without gangrene: Secondary | ICD-10-CM

## 2024-09-09 DIAGNOSIS — M79675 Pain in left toe(s): Secondary | ICD-10-CM

## 2024-09-09 MED ORDER — MUPIROCIN 2 % EX OINT
1.0000 | TOPICAL_OINTMENT | Freq: Every day | CUTANEOUS | 2 refills | Status: AC
  Filled 2024-09-09: qty 22, 22d supply, fill #0

## 2024-09-09 MED ORDER — DOXYCYCLINE HYCLATE 100 MG PO CAPS
100.0000 mg | ORAL_CAPSULE | Freq: Two times a day (BID) | ORAL | 0 refills | Status: AC
  Filled 2024-09-09: qty 20, 10d supply, fill #0

## 2024-09-09 NOTE — Progress Notes (Signed)
 "     Chief Complaint  Patient presents with   [facility]    [facility] with multiple wounds today. States he feel while he had the flu and scrapped them up. The left foot looks like actual ulcers. Red and warm to touch.  He states they are healing up. .  A1c 5.8 in Sept Plavix  and ASA   Discussed the use of AI scribe software for clinical note transcription with the patient, who gave verbal consent to proceed.  History of Present Illness Brandon Collins is a 73 year old male with type 2 diabetes and peripheral angiopathy who presents for evaluation of a foot ulcer.  He is also here for his scheduled diabetic nail care.  Nails are thick and elongated and causing discomfort in shoe gear during ambulation.  He developed a foot ulcer following a significant hypoglycemic episode during a recent flu-like illness (norovirus).  This has been present for 2 weeks.  There are multiple wounds present.  During this period, he experienced poor oral intake, resulting in hypoglycemia severe enough to cause weakness, loss of coordination, and a fall. He reports this as a new event, with no prior similar episodes.  He has been managing the ulcer by cleansing the area and covering it with socks, but has not used gauze or formal wound dressings. He notes the wound appeared worse several days ago but has since improved in appearance. He has difficulty reaching the area but attempts to maintain cleanliness.  He has been ill for several days with severe flu-like symptoms and poor appetite, which contributed to his hypoglycemia. He is currently able to ambulate at home using slip-on shoes and occasionally wears protective booties.   Past Medical History:  Diagnosis Date   Arthritis    all over (08/12/2018)   Complication of anesthesia 07/2018   out of it for 2 weeks.  Low Blood Sugar.   High cholesterol    HOH (hard of hearing)    Hypertension    Peripheral arterial disease    Type II diabetes mellitus (HCC)    Ulcer  of ankle, right, with unspecified severity [facility])    Past Surgical History:  Procedure Laterality Date   ABDOMINAL AORTOGRAM W/LOWER EXTREMITY N/A 01/22/2019   Procedure: ABDOMINAL AORTOGRAM W/LOWER EXTREMITY;  Surgeon: Sheree Penne Bruckner, MD;  Location: [facility] INVASIVE CV LAB;  Service: Cardiovascular;  Laterality: N/A;  rt leg   ABDOMINAL AORTOGRAM W/LOWER EXTREMITY N/A 10/26/2019   Procedure: ABDOMINAL AORTOGRAM W/LOWER EXTREMITY;  Surgeon: Sheree Penne Bruckner, MD;  Location: [facility] INVASIVE CV LAB;  Service: Cardiovascular;  Laterality: N/A;  Rt lower extermity    ABDOMINAL AORTOGRAM W/LOWER EXTREMITY Bilateral 09/26/2020   Procedure: ABDOMINAL AORTOGRAM W/LOWER EXTREMITY;  Surgeon: Sheree Penne Bruckner, MD;  Location: [facility] INVASIVE CV LAB;  Service: Cardiovascular;  Laterality: Bilateral;   ABDOMINAL AORTOGRAM W/LOWER EXTREMITY N/A 10/08/2022   Procedure: ABDOMINAL AORTOGRAM W/LOWER EXTREMITY;  Surgeon: Sheree Penne Bruckner, MD;  Location: [facility] INVASIVE CV LAB;  Service: Cardiovascular;  Laterality: N/A;   ANTERIOR CERVICAL DECOMP/DISCECTOMY FUSION  08/12/2018   CERVICAL 3-4, CERVICAL 4-5, CERVICAL 5-6, CERVICAL 6-7 WITH INSTRUMENTATION AND ALLOGRAFT   ANTERIOR CERVICAL DECOMPRESSION/DISCECTOMY FUSION 4 LEVELS N/A 08/12/2018   Procedure: ANTERIOR CERVICAL DECOMPRESSION FUSION CERVICAL 3-4, CERVICAL 4-5, CERVICAL 5-6, CERVICAL 6-7 WITH INSTRUMENTATION AND ALLOGRAFT;  Surgeon: Beuford Anes, MD;  Location: [facility] OR;  Service: Orthopedics;  Laterality: N/A;  ANTERIOR CERVICAL DECOMPRESSION FUSION CERVICAL 3-4, CERVICAL 4-5, CERVICAL 5-6, CERVICAL 6-7 WITH INSTRUMENTATION AND ALLOGRAFT  COLONOSCOPY W/ POLYPECTOMY     FEMORAL-POPLITEAL BYPASS GRAFT Right 01/27/2019   Procedure: right FEMORAL ENDARTERECTOMY, RIGHT  FEMORAL- BELOW THE KNEE POPLITEAL ARTERY BYPASS GRAFT using 6mm propaten ringed gore graft, attempted right femoral to below knee popliteal artery bypass using right nonreversed great  saphenous vein;  Surgeon: Sheree Penne Bruckner, MD;  Location: [facility] OR;  Service: Vascular;  Laterality: Right;   INGUINAL HERNIA REPAIR  1958   ? side   KNEE ARTHROSCOPY Right    PERIPHERAL VASCULAR ATHERECTOMY  10/26/2019   Procedure: PERIPHERAL VASCULAR ATHERECTOMY;  Surgeon: Sheree Penne Bruckner, MD;  Location: [facility] INVASIVE CV LAB;  Service: Cardiovascular;;  LASER   PERIPHERAL VASCULAR BALLOON ANGIOPLASTY Right 09/26/2020   Procedure: PERIPHERAL VASCULAR BALLOON ANGIOPLASTY;  Surgeon: Sheree Penne Bruckner, MD;  Location: [facility] INVASIVE CV LAB;  Service: Cardiovascular;  Laterality: Right;  SFA  FEMPOP BYPASS GRAFT   PERIPHERAL VASCULAR INTERVENTION Right 10/08/2022   Procedure: PERIPHERAL VASCULAR INTERVENTION;  Surgeon: Sheree Penne Bruckner, MD;  Location: [facility] INVASIVE CV LAB;  Service: Cardiovascular;  Laterality: Right;  Fem-pop bypass   PILONIDAL CYST EXCISION     Allergies[1]  Physical Exam Nails x 10 are 3 mm thick with yellow discoloration, subungual debris, distal onycholysis and pain with compression.  They are all elongated.  There are multiple partial-thickness ulcerations on the dorsal, distal, and medial aspect of both great toes.  There is some erythema to the left great toe.  There is also an escharotic, stable, superficial wound to the dorsal left second toe near the DIPJ.  See photos for locations and size.          Assessment/Plan of Care: 1. Skin ulcer of left great toe, limited to breakdown of skin (HCC)   2. Skin ulcer of right great toe, limited to breakdown of skin (HCC)   3. Pain due to onychomycosis of toenails of both feet   4. Type II diabetes mellitus with peripheral circulatory disorder (HCC)   5. Type 2 diabetes mellitus with foot ulcer (CODE) (HCC)      Meds ordered this encounter  Medications   mupirocin  ointment (BACTROBAN ) 2 %    Sig: Apply 1 Application topically daily. Apply to wounds once daily and cover with gauze dressing.     Dispense:  22 g    Refill:  2   doxycycline  (VIBRAMYCIN ) 100 MG capsule    Sig: Take 1 capsule (100 mg total) by mouth 2 (two) times daily for 10 days.    Dispense:  20 capsule    Refill:  0   Assessment & Plan Diabetic foot ulcers Diabetic foot ulcer developed following hypoglycemia and illness. The ulcer has shown some improvement according to the patient, with no evidence of severe infection or systemic symptoms. Management has included basic wound hygiene and sock coverage, but formal wound care has not yet been initiated. He remains at risk for progression or infection due to diabetes and peripheral arterial disease. - Prescribed oral antibiotics via [facility] in [place] for ulcer treatment. - Arranged for home delivery of wound care supplies (gauze and dressings), pending insurance approval and consent.  This was faxed to [facility] today - Ulcers were wrapped during the visit with Medihoney and dry sterile gauze dressings. - Advised to maintain wound cleanliness and keep covered with appropriate dressings. - Scheduled follow-up in 1-2 weeks to reassess ulcer healing.  If there is no continued healing with mupirocin  ointment and sterile dressings, then we will send in  a prescription for Santyl  ointment to a specialty pharmacy which will provide enzymatic debridement of these wounds combined with the use of daily dressing changes.  Pain due to onychomycosis of toenails of both feet - Nails x 10 were sharply debrided with sterile nail nippers and a power debriding burr to decrease girth and length.  Follow-up in 2 weeks for ulcer recheck.   [name], DPM, FACFAS [facility]     [address] [place], [place] [address]                Office [phone]  Fax [phone]      [1] No Known Allergies  "

## 2024-09-15 ENCOUNTER — Other Ambulatory Visit (HOSPITAL_BASED_OUTPATIENT_CLINIC_OR_DEPARTMENT_OTHER)

## 2024-09-15 ENCOUNTER — Other Ambulatory Visit (HOSPITAL_BASED_OUTPATIENT_CLINIC_OR_DEPARTMENT_OTHER): Payer: Self-pay

## 2024-09-15 DIAGNOSIS — E1151 Type 2 diabetes mellitus with diabetic peripheral angiopathy without gangrene: Secondary | ICD-10-CM

## 2024-09-15 LAB — HEMOGLOBIN A1C
Est. average glucose Bld gHb Est-mCnc: 134 mg/dL
Hgb A1c MFr Bld: 6.3 % — ABNORMAL HIGH (ref 4.8–5.6)

## 2024-09-16 ENCOUNTER — Ambulatory Visit (HOSPITAL_BASED_OUTPATIENT_CLINIC_OR_DEPARTMENT_OTHER): Payer: Self-pay | Admitting: Student

## 2024-09-21 ENCOUNTER — Encounter (HOSPITAL_BASED_OUTPATIENT_CLINIC_OR_DEPARTMENT_OTHER): Payer: Self-pay

## 2024-09-21 ENCOUNTER — Other Ambulatory Visit (HOSPITAL_BASED_OUTPATIENT_CLINIC_OR_DEPARTMENT_OTHER): Payer: Self-pay

## 2024-10-06 ENCOUNTER — Other Ambulatory Visit (HOSPITAL_BASED_OUTPATIENT_CLINIC_OR_DEPARTMENT_OTHER): Payer: Self-pay

## 2024-10-06 ENCOUNTER — Other Ambulatory Visit (HOSPITAL_BASED_OUTPATIENT_CLINIC_OR_DEPARTMENT_OTHER): Payer: Self-pay | Admitting: Student

## 2024-10-06 MED ORDER — PIOGLITAZONE HCL 15 MG PO TABS
15.0000 mg | ORAL_TABLET | Freq: Every day | ORAL | 2 refills | Status: AC
  Filled 2024-10-06: qty 90, 90d supply, fill #0

## 2024-10-14 ENCOUNTER — Other Ambulatory Visit (HOSPITAL_BASED_OUTPATIENT_CLINIC_OR_DEPARTMENT_OTHER): Payer: Self-pay

## 2024-12-09 ENCOUNTER — Ambulatory Visit: Admitting: Podiatry

## 2025-02-24 ENCOUNTER — Ambulatory Visit (HOSPITAL_BASED_OUTPATIENT_CLINIC_OR_DEPARTMENT_OTHER): Admitting: Student
